# Patient Record
Sex: Female | Born: 1935 | Race: Asian | Hispanic: No | Marital: Married | Smoking: Never smoker
Health system: Southern US, Community
[De-identification: ages and names within clinical notes are randomized; demographics above are authoritative.]

## PROBLEM LIST (undated history)

## (undated) DIAGNOSIS — Z87898 Personal history of other specified conditions: Secondary | ICD-10-CM

## (undated) DIAGNOSIS — M8589 Other specified disorders of bone density and structure, multiple sites: Secondary | ICD-10-CM

## (undated) DIAGNOSIS — N393 Stress incontinence (female) (male): Secondary | ICD-10-CM

## (undated) DIAGNOSIS — M47815 Spondylosis without myelopathy or radiculopathy, thoracolumbar region: Secondary | ICD-10-CM

## (undated) DIAGNOSIS — E785 Hyperlipidemia, unspecified: Secondary | ICD-10-CM

## (undated) DIAGNOSIS — J302 Other seasonal allergic rhinitis: Secondary | ICD-10-CM

## (undated) DIAGNOSIS — I10 Essential (primary) hypertension: Secondary | ICD-10-CM

## (undated) DIAGNOSIS — F0390 Unspecified dementia without behavioral disturbance: Secondary | ICD-10-CM

## (undated) DIAGNOSIS — K859 Acute pancreatitis without necrosis or infection, unspecified: Secondary | ICD-10-CM

## (undated) HISTORY — DX: Other specified disorders of bone density and structure, multiple sites: M85.89

## (undated) HISTORY — DX: Other seasonal allergic rhinitis: J30.2

## (undated) HISTORY — PX: CHOLECYSTECTOMY: SHX55

## (undated) HISTORY — DX: Spondylosis without myelopathy or radiculopathy, thoracolumbar region: M47.815

## (undated) HISTORY — DX: Essential (primary) hypertension: I10

## (undated) HISTORY — DX: Personal history of other specified conditions: Z87.898

## (undated) HISTORY — DX: Hyperlipidemia, unspecified: E78.5

## (undated) HISTORY — PX: OTHER SURGICAL HISTORY: SHX169

## (undated) HISTORY — DX: Stress incontinence (female) (male): N39.3

---

## 2004-06-25 ENCOUNTER — Ambulatory Visit: Payer: Self-pay | Admitting: Family Medicine

## 2006-03-03 ENCOUNTER — Ambulatory Visit: Payer: Self-pay | Admitting: Family Medicine

## 2007-03-06 ENCOUNTER — Ambulatory Visit: Payer: Self-pay | Admitting: Family Medicine

## 2008-08-08 ENCOUNTER — Ambulatory Visit: Payer: Self-pay | Admitting: Family Medicine

## 2011-03-01 DIAGNOSIS — I10 Essential (primary) hypertension: Secondary | ICD-10-CM | POA: Diagnosis not present

## 2011-03-01 DIAGNOSIS — E785 Hyperlipidemia, unspecified: Secondary | ICD-10-CM | POA: Diagnosis not present

## 2011-10-18 DIAGNOSIS — Z23 Encounter for immunization: Secondary | ICD-10-CM | POA: Diagnosis not present

## 2012-02-07 DIAGNOSIS — R252 Cramp and spasm: Secondary | ICD-10-CM | POA: Diagnosis not present

## 2012-02-07 DIAGNOSIS — E7800 Pure hypercholesterolemia, unspecified: Secondary | ICD-10-CM | POA: Diagnosis not present

## 2012-08-03 ENCOUNTER — Ambulatory Visit: Payer: Self-pay | Admitting: Family Medicine

## 2012-11-09 ENCOUNTER — Ambulatory Visit: Payer: Self-pay | Admitting: Family Medicine

## 2012-12-26 ENCOUNTER — Ambulatory Visit: Payer: Self-pay | Admitting: Family Medicine

## 2012-12-26 DIAGNOSIS — R05 Cough: Secondary | ICD-10-CM | POA: Diagnosis not present

## 2012-12-26 IMAGING — CR DG CHEST 2V
1 series · 2 of 2 positions shown · non-contrast
Comparison: None.

CLINICAL DATA: Cough.

EXAM:
CHEST  2 VIEW

[Series 1: pa · 0.17mm/px · 2 of 2 slices shown]
[im 1/2]
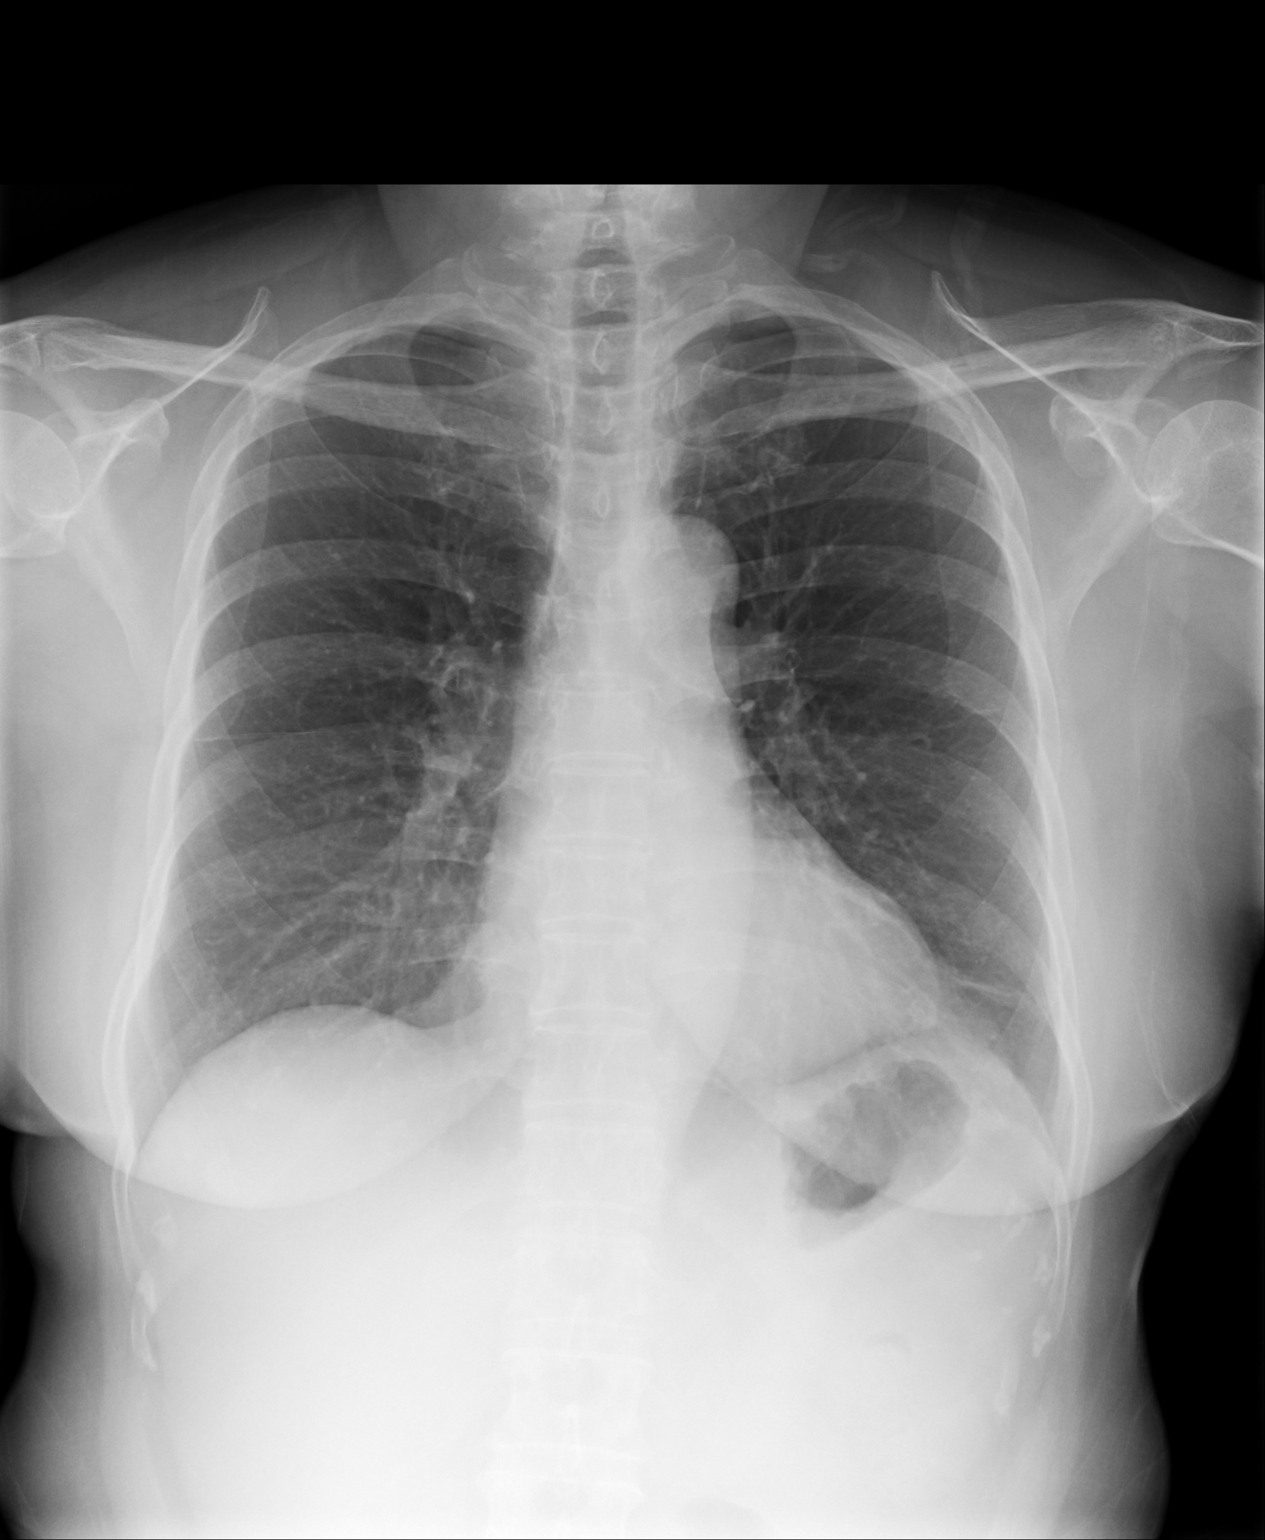
[im 2/2]
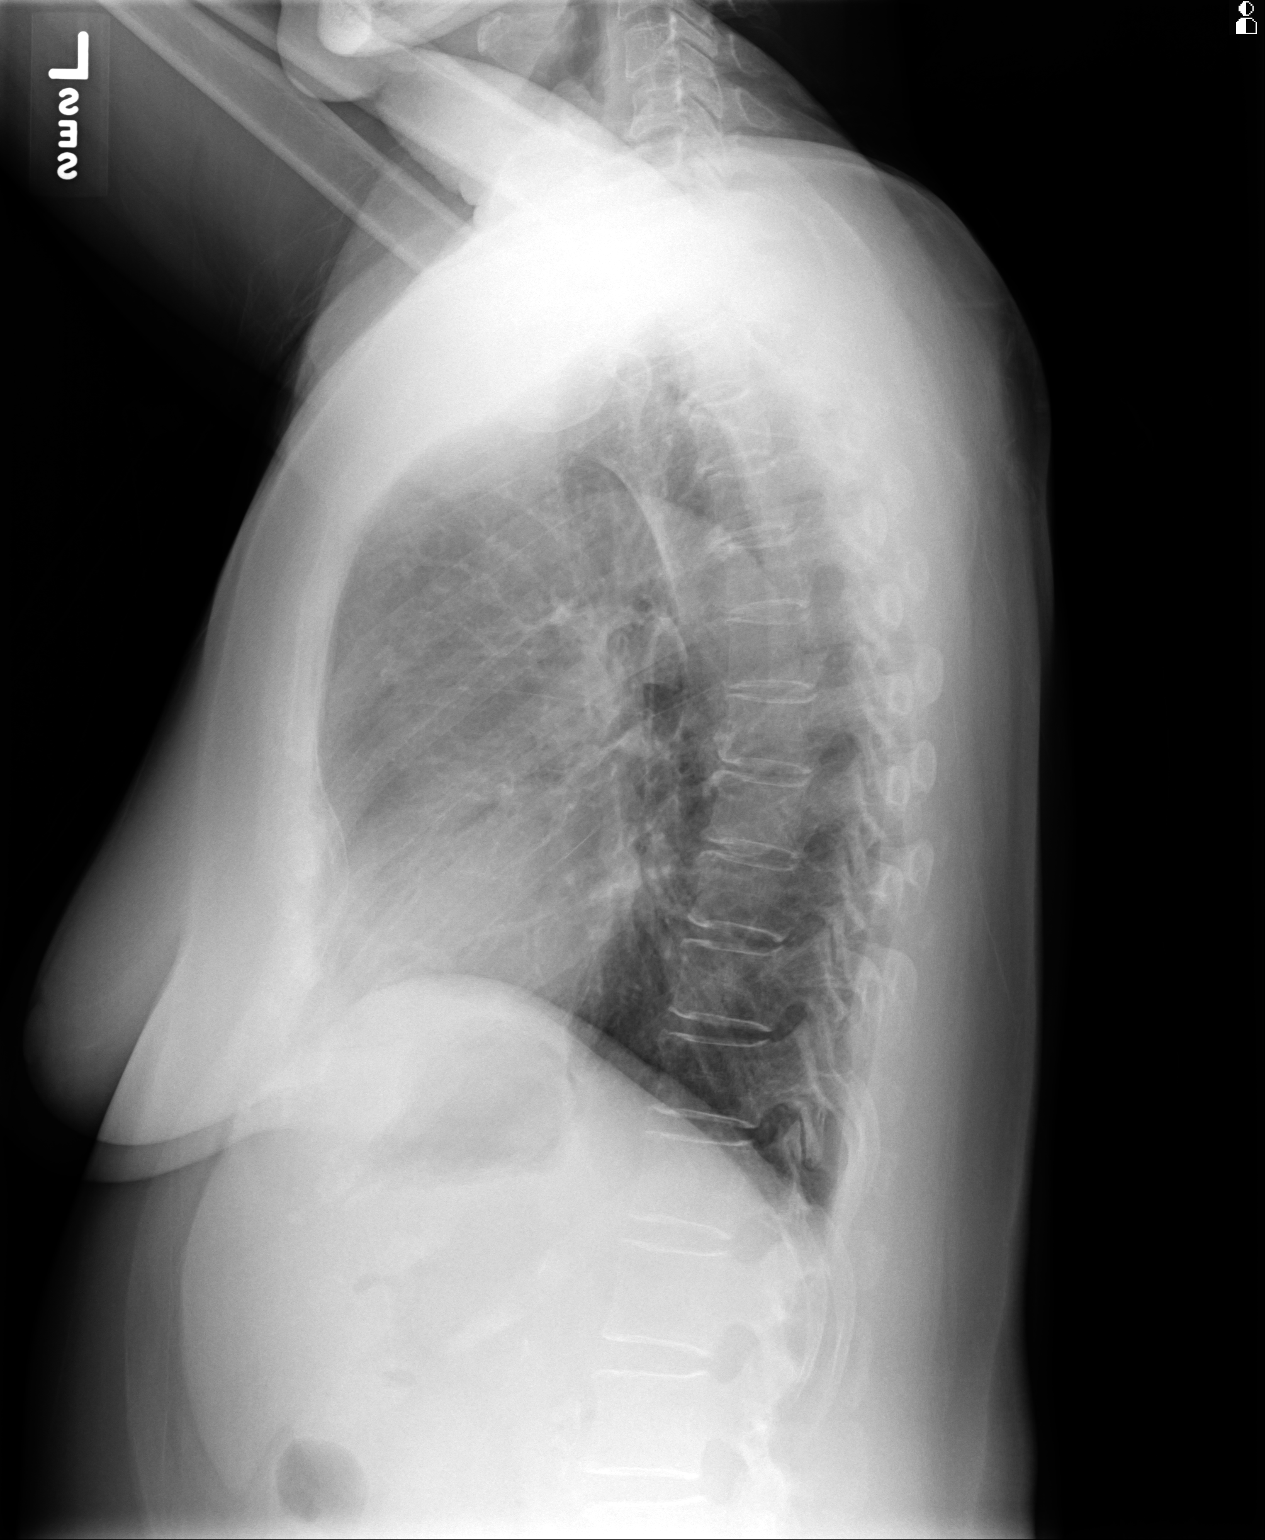

[2 of 2 positions shown; findings below may reference images not displayed]

FINDINGS: The heart size and mediastinal contours are within normal limits.
Both lungs are clear. No pleural effusion or pneumothorax is noted.
The visualized skeletal structures are unremarkable.
IMPRESSION: No acute cardiopulmonary abnormality seen.

## 2013-01-03 DIAGNOSIS — E7800 Pure hypercholesterolemia, unspecified: Secondary | ICD-10-CM | POA: Diagnosis not present

## 2013-01-03 DIAGNOSIS — I10 Essential (primary) hypertension: Secondary | ICD-10-CM | POA: Diagnosis not present

## 2013-01-29 DIAGNOSIS — I83893 Varicose veins of bilateral lower extremities with other complications: Secondary | ICD-10-CM | POA: Diagnosis not present

## 2013-02-15 ENCOUNTER — Ambulatory Visit: Payer: Self-pay | Admitting: Emergency Medicine

## 2013-02-15 IMAGING — CR DG CHEST 2V
1 series · 2 of 2 positions shown · non-contrast
Comparison: [DATE]

CLINICAL DATA: Cough for 5 months

EXAM:
CHEST  2 VIEW

[Series 1: pa · 0.17mm/px · 2 of 2 slices shown]
[im 1/2]
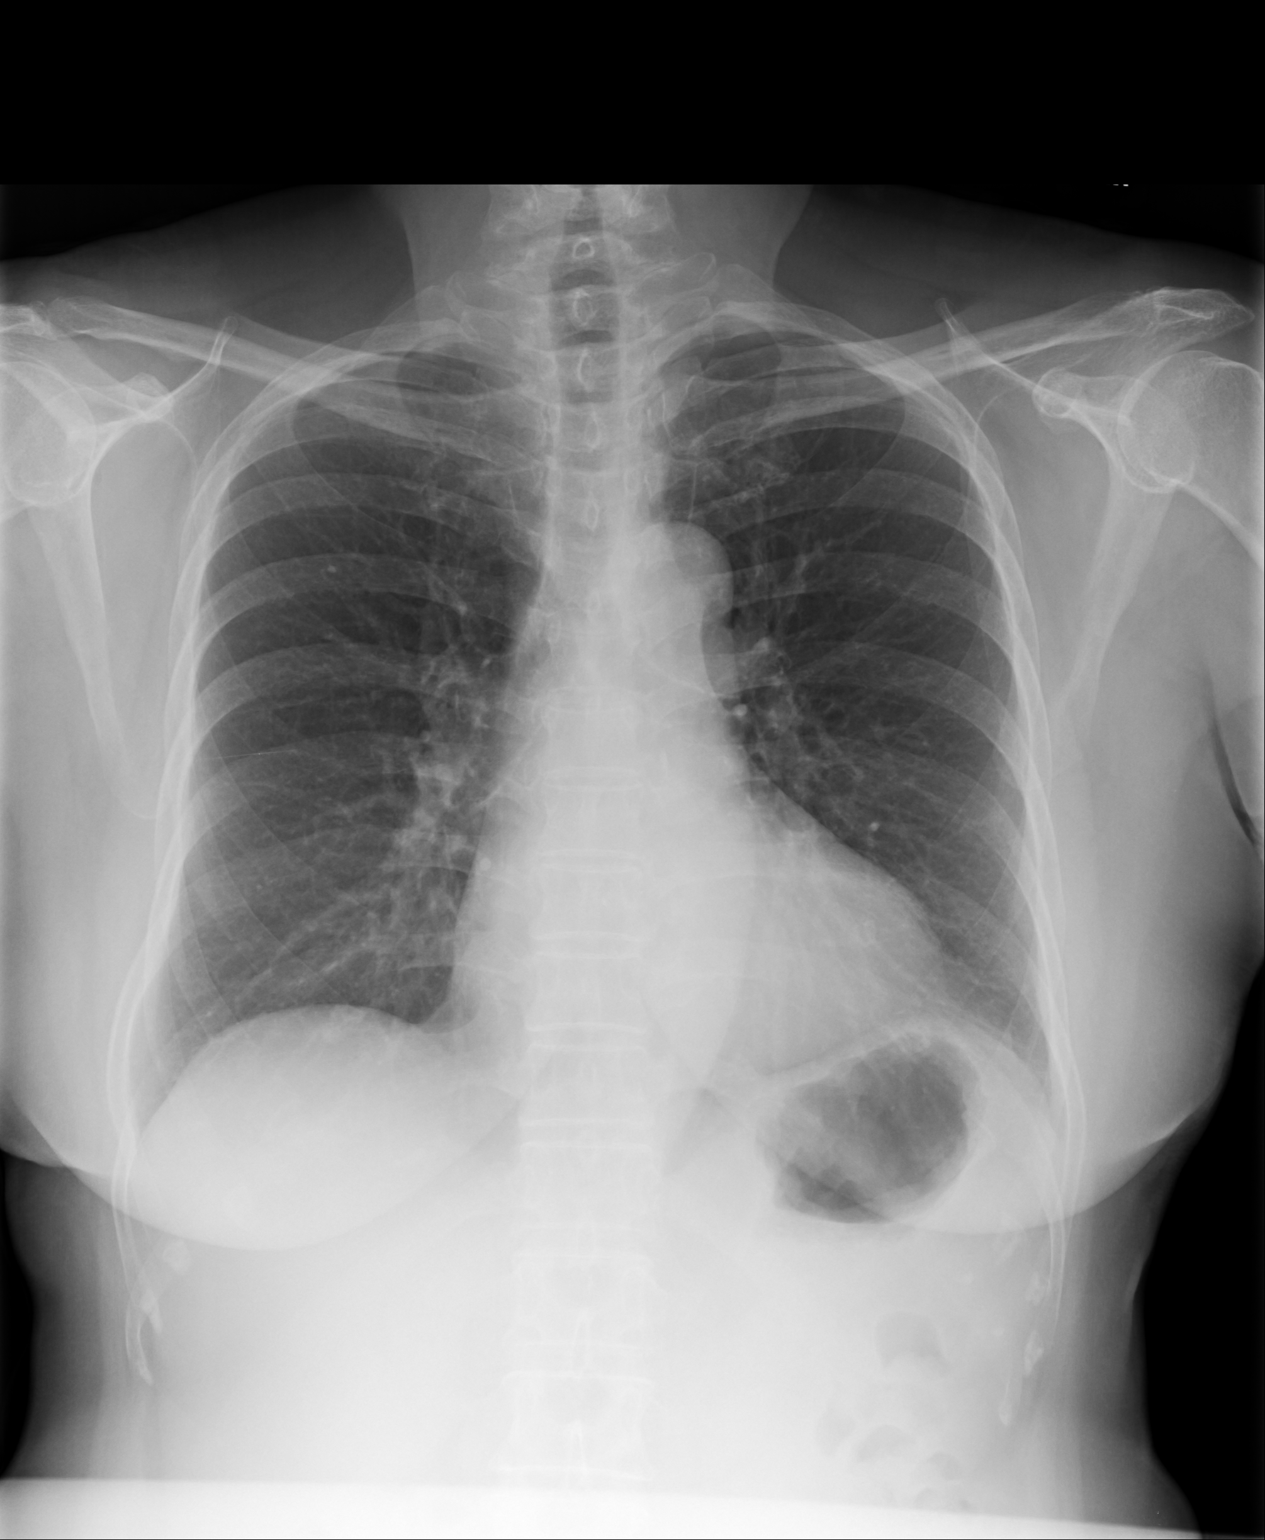
[im 2/2]
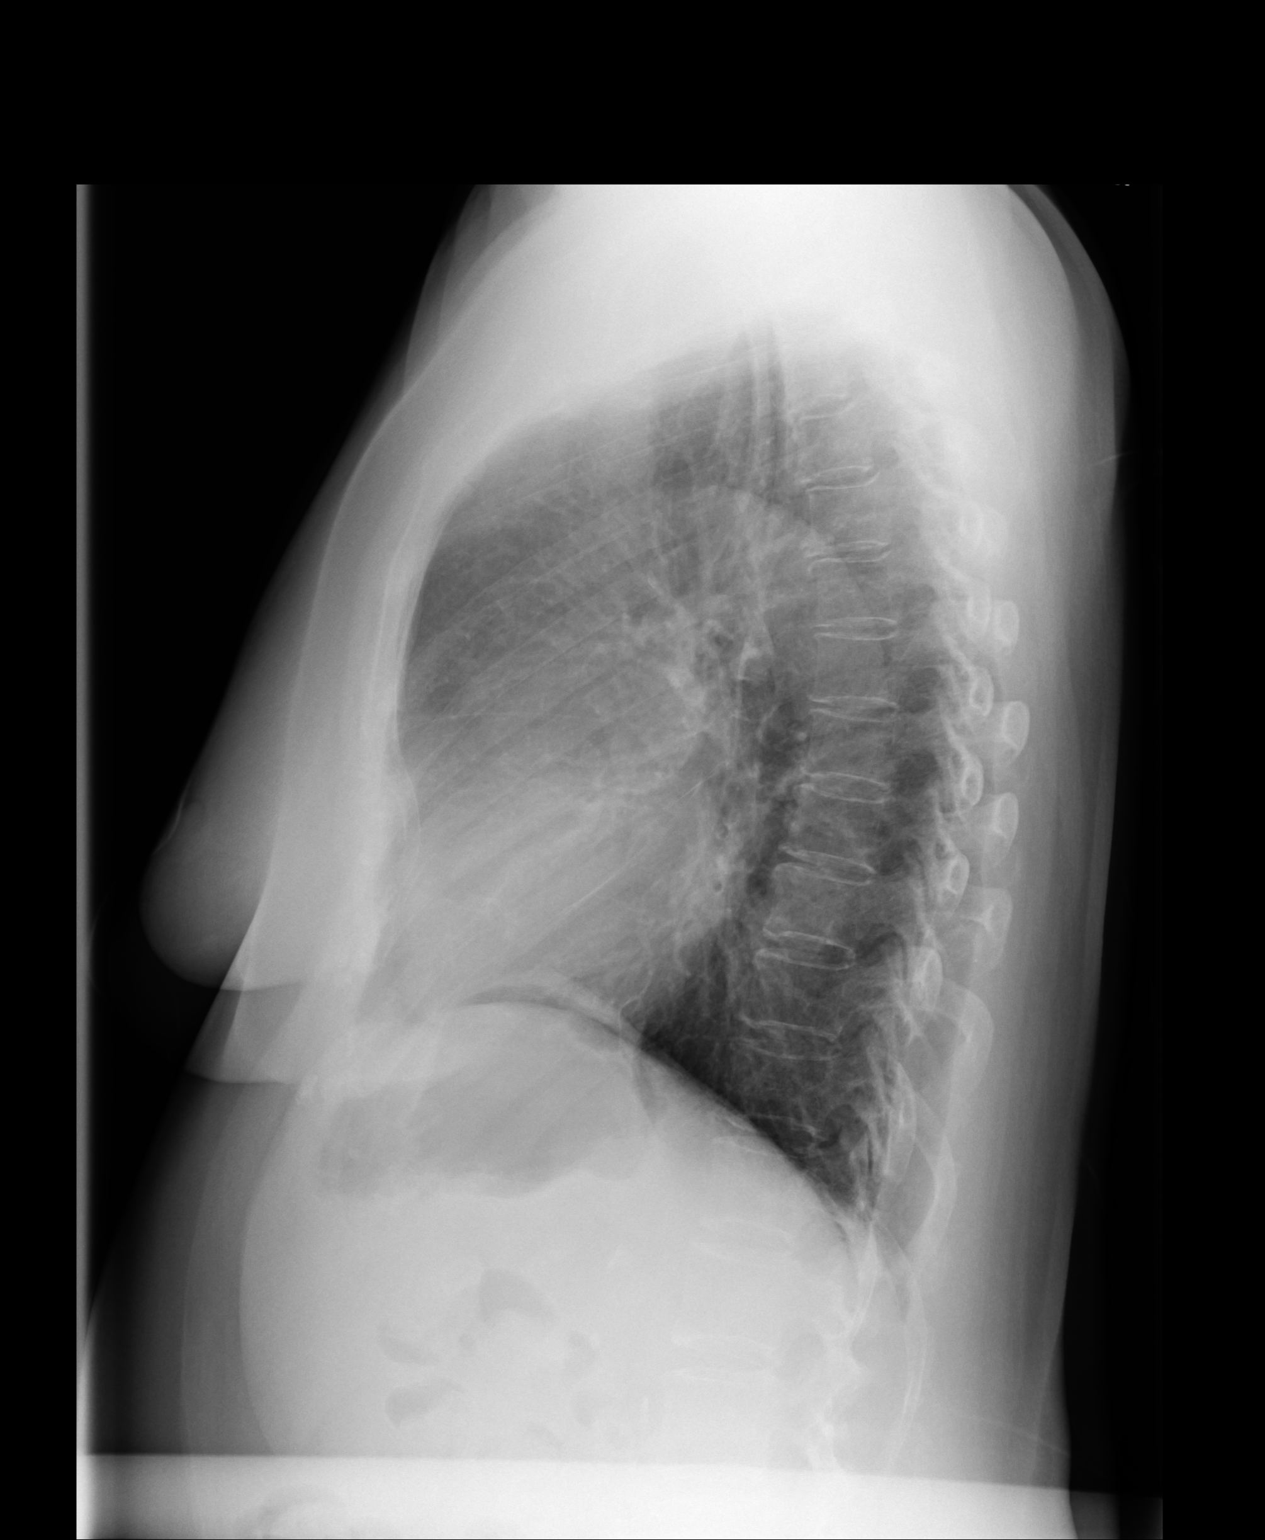

[2 of 2 positions shown; findings below may reference images not displayed]

FINDINGS: Cardiac shadow is within normal limits. The lungs are clear
bilaterally. No acute bony abnormality is seen.
IMPRESSION: No active cardiopulmonary disease.

## 2013-06-26 DIAGNOSIS — H52229 Regular astigmatism, unspecified eye: Secondary | ICD-10-CM | POA: Diagnosis not present

## 2013-06-26 DIAGNOSIS — H5200 Hypermetropia, unspecified eye: Secondary | ICD-10-CM | POA: Diagnosis not present

## 2013-06-26 DIAGNOSIS — H43819 Vitreous degeneration, unspecified eye: Secondary | ICD-10-CM | POA: Diagnosis not present

## 2013-06-26 DIAGNOSIS — H2510 Age-related nuclear cataract, unspecified eye: Secondary | ICD-10-CM | POA: Diagnosis not present

## 2014-02-15 DIAGNOSIS — I10 Essential (primary) hypertension: Secondary | ICD-10-CM | POA: Diagnosis not present

## 2014-02-15 DIAGNOSIS — R42 Dizziness and giddiness: Secondary | ICD-10-CM | POA: Diagnosis not present

## 2014-02-15 DIAGNOSIS — E782 Mixed hyperlipidemia: Secondary | ICD-10-CM | POA: Diagnosis not present

## 2014-02-15 DIAGNOSIS — J302 Other seasonal allergic rhinitis: Secondary | ICD-10-CM | POA: Diagnosis not present

## 2014-02-15 DIAGNOSIS — Z9181 History of falling: Secondary | ICD-10-CM | POA: Diagnosis not present

## 2014-02-15 DIAGNOSIS — Z1389 Encounter for screening for other disorder: Secondary | ICD-10-CM | POA: Diagnosis not present

## 2014-02-18 DIAGNOSIS — I10 Essential (primary) hypertension: Secondary | ICD-10-CM | POA: Diagnosis not present

## 2014-02-18 DIAGNOSIS — E785 Hyperlipidemia, unspecified: Secondary | ICD-10-CM | POA: Diagnosis not present

## 2014-02-18 DIAGNOSIS — R42 Dizziness and giddiness: Secondary | ICD-10-CM | POA: Diagnosis not present

## 2014-03-25 DIAGNOSIS — E780 Pure hypercholesterolemia: Secondary | ICD-10-CM | POA: Diagnosis not present

## 2014-03-25 DIAGNOSIS — I10 Essential (primary) hypertension: Secondary | ICD-10-CM | POA: Diagnosis not present

## 2014-03-25 DIAGNOSIS — R05 Cough: Secondary | ICD-10-CM | POA: Diagnosis not present

## 2014-03-25 DIAGNOSIS — J302 Other seasonal allergic rhinitis: Secondary | ICD-10-CM | POA: Diagnosis not present

## 2014-07-29 DIAGNOSIS — M9904 Segmental and somatic dysfunction of sacral region: Secondary | ICD-10-CM | POA: Diagnosis not present

## 2014-07-29 DIAGNOSIS — M9905 Segmental and somatic dysfunction of pelvic region: Secondary | ICD-10-CM | POA: Diagnosis not present

## 2014-07-29 DIAGNOSIS — M545 Low back pain: Secondary | ICD-10-CM | POA: Diagnosis not present

## 2014-07-29 DIAGNOSIS — M9903 Segmental and somatic dysfunction of lumbar region: Secondary | ICD-10-CM | POA: Diagnosis not present

## 2014-07-31 DIAGNOSIS — M9903 Segmental and somatic dysfunction of lumbar region: Secondary | ICD-10-CM | POA: Diagnosis not present

## 2014-07-31 DIAGNOSIS — M9904 Segmental and somatic dysfunction of sacral region: Secondary | ICD-10-CM | POA: Diagnosis not present

## 2014-07-31 DIAGNOSIS — M9905 Segmental and somatic dysfunction of pelvic region: Secondary | ICD-10-CM | POA: Diagnosis not present

## 2014-07-31 DIAGNOSIS — M545 Low back pain: Secondary | ICD-10-CM | POA: Diagnosis not present

## 2014-10-29 ENCOUNTER — Encounter: Payer: Self-pay | Admitting: Emergency Medicine

## 2014-10-29 ENCOUNTER — Ambulatory Visit
Admission: EM | Admit: 2014-10-29 | Discharge: 2014-10-29 | Disposition: A | Discharge status: Home / Self Care | Payer: Medicare Other | Attending: Family Medicine | Admitting: Family Medicine

## 2014-10-29 DIAGNOSIS — L980 Pyogenic granuloma: Secondary | ICD-10-CM | POA: Diagnosis not present

## 2014-10-29 NOTE — ED Notes (Signed)
Patient states she "pinched the skin on her pinkie finger 2 months ago causing a blister. It doesn't seem to be getting any better."

## 2014-10-29 NOTE — Discharge Instructions (Signed)
graniloma

## 2014-10-29 NOTE — ED Provider Notes (Signed)
CSN: [id number]     Arrival date & time 10/29/14  1478 History   First MD Initiated Contact with Patient 10/29/14 1018     Chief Complaint  Patient presents with  . Hand Pain   (Consider location/radiation/quality/duration/timing/severity/associated sxs/prior Treatment) HPI   Is a 79 year old female who presents with a growth on her left little finger distal pad. She states that 2 months ago she pinched her skin causing a blister which then had progressed to the growth that she has noticed enlarging. Her other complaint is that it bleeds frequently and she washes dishes or uses her hand excessively. She is concerned that it was an infection. She denies any fever or chills.  History reviewed. No pertinent past medical history. History reviewed. No pertinent past surgical history. History reviewed. No pertinent family history. Social History  Substance Use Topics  . Smoking status: Never Smoker   . Smokeless tobacco: None  . Alcohol Use: No   OB History    No data available     Review of Systems  Constitutional: Negative for fever, chills and fatigue.  Skin: Positive for wound.  All other systems reviewed and are negative.   Allergies  Review of patient's allergies indicates no known allergies.  Home Medications   Prior to Admission medications   Not on File   Meds Ordered and Administered this Visit  Medications - No data to display  BP 185/65 mmHg  Pulse 65  Temp(Src) 96.8 F (36 C) (Tympanic)  Resp 16  Ht 5' (1.524 m)  Wt 119 lb (53.978 kg)  BMI 23.24 kg/m2  SpO2 100% No data found.   Physical Exam  Constitutional: She is oriented to person, place, and time. She appears well-developed and well-nourished.  HENT:  Head: Normocephalic and atraumatic.  Eyes: Pupils are equal, round, and reactive to light.  Neck: Neck supple.  Musculoskeletal: Normal range of motion. She exhibits edema and tenderness.  Neurological: She is alert and oriented to person, place,  and time.  Skin:  Examination of the left hand of the fifth finger nodule with a smooth erythematous surface that has a dusky red color to it measuring approximately 1 cm in diameter with a dark dermal collar. There is a single lesion with no satellite lesions seen. It appears friable. No active bleeding present.  Psychiatric: She has a normal mood and affect. Her behavior is normal. Judgment and thought content normal.  Nursing note and vitals reviewed.   ED Course  Procedures (including critical care time)  Labs Review Labs Reviewed - No data to display  Imaging Review No results found.   Visual Acuity Review  Right Eye Distance:   Left Eye Distance:   Bilateral Distance:    Right Eye Near:   Left Eye Near:    Bilateral Near:         MDM   1. Pyogenic granuloma of skin    Plan: 1. Diagnosis reviewed with patient 2. rx as per orders; risks, benefits, potential side effects reviewed with patient 3. Recommend supportive treatment with protection until seen by dermatologist 4. F/u prn if symptoms worsen or don't improve     Lutricia Feil, PA-C 10/29/14 1121

## 2014-10-31 DIAGNOSIS — L980 Pyogenic granuloma: Secondary | ICD-10-CM | POA: Diagnosis not present

## 2014-10-31 DIAGNOSIS — L538 Other specified erythematous conditions: Secondary | ICD-10-CM | POA: Diagnosis not present

## 2014-10-31 DIAGNOSIS — R58 Hemorrhage, not elsewhere classified: Secondary | ICD-10-CM | POA: Diagnosis not present

## 2015-10-21 DIAGNOSIS — Z23 Encounter for immunization: Secondary | ICD-10-CM | POA: Diagnosis not present

## 2016-04-26 ENCOUNTER — Encounter: Payer: Self-pay | Admitting: *Deleted

## 2016-04-26 ENCOUNTER — Ambulatory Visit
Admission: EM | Admit: 2016-04-26 | Discharge: 2016-04-26 | Disposition: A | Discharge status: Home / Self Care | Payer: Medicare Other | Attending: Family Medicine | Admitting: Family Medicine

## 2016-04-26 DIAGNOSIS — J111 Influenza due to unidentified influenza virus with other respiratory manifestations: Secondary | ICD-10-CM | POA: Insufficient documentation

## 2016-04-26 DIAGNOSIS — R69 Illness, unspecified: Secondary | ICD-10-CM

## 2016-04-26 DIAGNOSIS — R05 Cough: Secondary | ICD-10-CM | POA: Diagnosis present

## 2016-04-26 LAB — RAPID STREP SCREEN (MED CTR MEBANE ONLY): Streptococcus, Group A Screen (Direct): NEGATIVE

## 2016-04-26 MED ORDER — BENZONATATE 100 MG PO CAPS: 100.0000 mg | ORAL_CAPSULE | Freq: Three times a day (TID) | ORAL | 0 refills | Status: DC | PRN

## 2016-04-26 MED ORDER — HYDROCOD POLST-CPM POLST ER 10-8 MG/5ML PO SUER: 2.5000 mL | Freq: Every evening | ORAL | 0 refills | Status: DC | PRN

## 2016-04-26 MED ORDER — OSELTAMIVIR PHOSPHATE 75 MG PO CAPS: 75.0000 mg | ORAL_CAPSULE | Freq: Two times a day (BID) | ORAL | 0 refills | Status: DC

## 2016-04-26 NOTE — ED Provider Notes (Signed)
[facility] ____________________________________________  Time seen: Approximately 12:30 PM  I have reviewed the triage vital signs and the nursing notes.   HISTORY  Chief Complaint Cough; Fever; and Headache  HPI Madeline Taylor is a 81 y.o. female presenting with husband at bedside for the complaints of 2 days of runny nose, nasal congestion, cough, sore throat and reported fevers. Reports some chills and felt like she had a fever, but denies measuring temperature. States mild sore throat. States cough intermittently woke her up from her sleep last night. Denies chest pain, shortness of breath or wheezing. Reports continues to eat and drink well. Reports has been recently had some allergy issues and was seen in urgent care this past Saturday, and reports she was around a lot of sick people in the waiting room. Patient reports that she felt fine prior to yesterday.  Denies chest pain, shortness of breath, abdominal pain, dysuria, extremity pain, extremity swelling or rash. Denies recent sickness. Denies recent antibiotic use. Denies cardiac history. Denies renal insufficiency. Reports she does have some chronic seasonal allergy issues and does intermittently take Zyrtec. Patient states that this feels different than her normal seasonal allergies.     History reviewed. No pertinent past medical history. Seasonal allergies.  Denies chronic medical history.  There are no active problems to display for this patient.   History reviewed. No pertinent surgical history.   No current facility-administered medications for this encounter.   Current Outpatient Prescriptions:  .  benzonatate (TESSALON PERLES) 100 MG capsule, Take 1 capsule (100 mg total) by mouth 3 (three) times daily as needed for cough., Disp: 15 capsule, Rfl: 0 .  chlorpheniramine-HYDROcodone (TUSSIONEX PENNKINETIC ER) 10-8 MG/5ML SUER, Take 2.5 mLs by mouth at bedtime as needed for cough. do not drive or operate  machinery while taking as can cause drowsiness., Disp: 20 mL, Rfl: 0 .  oseltamivir (TAMIFLU) 75 MG capsule, Take 1 capsule (75 mg total) by mouth every 12 (twelve) hours., Disp: 10 capsule, Rfl: 0  Allergies Patient has no known allergies.  History reviewed. No pertinent family history.  Social History Social History  Substance Use Topics  . Smoking status: Never Smoker  . Smokeless tobacco: Never Used  . Alcohol use No    Review of Systems Constitutional: As above.  Eyes: No visual changes. ENT: As above.  Cardiovascular: Denies chest pain. Respiratory: Denies shortness of breath. Gastrointestinal: No abdominal pain.  No nausea, no vomiting.  No diarrhea.  No constipation. Genitourinary: Negative for dysuria. Musculoskeletal: Negative for back pain. Skin: Negative for rash.   ____________________________________________   PHYSICAL EXAM:  VITAL SIGNS: ED Triage Vitals  Enc Vitals Group     BP 04/26/16 1128 (!) 147/96     Pulse Rate 04/26/16 1128 90     Resp 04/26/16 1128 16     Temp 04/26/16 1128 98.9 F (37.2 C)     Temp Source 04/26/16 1128 Oral     SpO2 04/26/16 1128 100 %     Weight 04/26/16 1131 115 lb (52.2 kg)     Height 04/26/16 1131  (1.499 m)     Head Circumference --      Peak Flow --      Pain Score --      Pain Loc --      Pain Edu? --      Excl. in GC? --     Constitutional: Alert and oriented. Well appearing and in no acute distress. Eyes: Conjunctivae are  normal. PERRL. EOMI. Head: Atraumatic. No sinus tenderness to palpation. No swelling. No erythema.  Ears: no erythema, normal TMs bilaterally.   Nose:Nasal congestion with clear rhinorrhea  Mouth/Throat: Mucous membranes are moist. Mild pharyngeal erythema. No tonsillar swelling or exudate.  Neck: No stridor.  No cervical spine tenderness to palpation. Hematological/Lymphatic/Immunilogical: No cervical lymphadenopathy. Cardiovascular: Normal rate, regular rhythm. Grossly normal  heart sounds.  Good peripheral circulation. Respiratory: Normal respiratory effort.  No retractions. No wheezes, rales or rhonchi. Good air movement. Dry intermittent cough noted in room. Speaks in complete sentences. Gastrointestinal: Soft and nontender. Musculoskeletal: Ambulatory with steady gait. No cervical, thoracic or lumbar tenderness to palpation. Neurologic:  Normal speech and language. No gait instability. Skin:  Skin appears warm, dry. Psychiatric: Mood and affect are normal. Speech and behavior are normal.  ___________________________________________   LABS (all labs ordered are listed, but only abnormal results are displayed)  Labs Reviewed  RAPID STREP SCREEN (NOT AT [facility])     PROCEDURES Procedures    INITIAL IMPRESSION / ASSESSMENT AND PLAN / ED COURSE  Pertinent labs & imaging results that were available during my care of the patient were reviewed by me and considered in my medical decision making (see chart for details).  Well-appearing patient. No acute distress. Quick strep negative, will culture. Discussed in detail with patient and spouse, suspect viral illness and influenza-like illness. Discussed evaluation and treatment options. Will treat patient with oral Tamiflu, when necessary Tessalon Perles and half dosage when necessary Tussionex at night as needed. Patient's spouse reports she has taken cough medicine such as Tussionex in past and tolerated well.Discussed indication, risks and benefits of medications with patient.  Discussed follow up with Primary care physician this week. Discussed follow up and return parameters including no resolution or any worsening concerns. Patient verbalized understanding and agreed to plan.   ____________________________________________   FINAL CLINICAL IMPRESSION(S) / ED DIAGNOSES  Final diagnoses:  Influenza-like illness     New Prescriptions   BENZONATATE (TESSALON PERLES) 100 MG CAPSULE    Take 1 capsule (100 mg  total) by mouth 3 (three) times daily as needed for cough.   CHLORPHENIRAMINE-HYDROCODONE (TUSSIONEX PENNKINETIC ER) 10-8 MG/5ML SUER    Take 2.5 mLs by mouth at bedtime as needed for cough. do not drive or operate machinery while taking as can cause drowsiness.   OSELTAMIVIR (TAMIFLU) 75 MG CAPSULE    Take 1 capsule (75 mg total) by mouth every 12 (twelve) hours.    Note: This dictation was prepared with Dragon dictation along with smaller phrase technology. Any transcriptional errors that result from this process are unintentional.         Renford Dills, NP 04/26/16 1313

## 2016-04-26 NOTE — Discharge Instructions (Signed)
Take medication as prescribed. Rest. Drink plenty of fluids.  ° °Follow up with your primary care physician this week as needed. Return to Urgent care for new or worsening concerns.  ° °

## 2016-04-26 NOTE — ED Triage Notes (Signed)
Productive cough- clear, fever, and headache x 2days.

## 2016-04-29 LAB — CULTURE, GROUP A STREP (THRC)

## 2016-07-27 ENCOUNTER — Ambulatory Visit: Payer: Self-pay | Admitting: Nurse Practitioner

## 2016-07-29 ENCOUNTER — Ambulatory Visit (INDEPENDENT_AMBULATORY_CARE_PROVIDER_SITE_OTHER): Payer: Medicare Other | Admitting: Nurse Practitioner

## 2016-07-29 ENCOUNTER — Encounter: Payer: Self-pay | Admitting: Nurse Practitioner

## 2016-07-29 VITALS — BP 142/99 | HR 61 | Temp 97.9°F | Ht 59.0 in | Wt 115.2 lb

## 2016-07-29 DIAGNOSIS — N393 Stress incontinence (female) (male): Secondary | ICD-10-CM | POA: Diagnosis not present

## 2016-07-29 DIAGNOSIS — Z1321 Encounter for screening for nutritional disorder: Secondary | ICD-10-CM

## 2016-07-29 DIAGNOSIS — Z1239 Encounter for other screening for malignant neoplasm of breast: Secondary | ICD-10-CM

## 2016-07-29 DIAGNOSIS — M810 Age-related osteoporosis without current pathological fracture: Secondary | ICD-10-CM | POA: Insufficient documentation

## 2016-07-29 DIAGNOSIS — J302 Other seasonal allergic rhinitis: Secondary | ICD-10-CM

## 2016-07-29 DIAGNOSIS — M47815 Spondylosis without myelopathy or radiculopathy, thoracolumbar region: Secondary | ICD-10-CM

## 2016-07-29 DIAGNOSIS — Z13820 Encounter for screening for osteoporosis: Secondary | ICD-10-CM

## 2016-07-29 DIAGNOSIS — R799 Abnormal finding of blood chemistry, unspecified: Secondary | ICD-10-CM

## 2016-07-29 DIAGNOSIS — I10 Essential (primary) hypertension: Secondary | ICD-10-CM | POA: Diagnosis not present

## 2016-07-29 DIAGNOSIS — Z1329 Encounter for screening for other suspected endocrine disorder: Secondary | ICD-10-CM | POA: Diagnosis not present

## 2016-07-29 DIAGNOSIS — Z131 Encounter for screening for diabetes mellitus: Secondary | ICD-10-CM | POA: Diagnosis not present

## 2016-07-29 DIAGNOSIS — Z87898 Personal history of other specified conditions: Secondary | ICD-10-CM

## 2016-07-29 DIAGNOSIS — M479 Spondylosis, unspecified: Secondary | ICD-10-CM

## 2016-07-29 DIAGNOSIS — Z0000 Encounter for general adult medical examination without abnormal findings: Secondary | ICD-10-CM

## 2016-07-29 DIAGNOSIS — R252 Cramp and spasm: Secondary | ICD-10-CM | POA: Diagnosis not present

## 2016-07-29 DIAGNOSIS — M8589 Other specified disorders of bone density and structure, multiple sites: Secondary | ICD-10-CM | POA: Diagnosis not present

## 2016-07-29 DIAGNOSIS — E785 Hyperlipidemia, unspecified: Secondary | ICD-10-CM | POA: Diagnosis not present

## 2016-07-29 DIAGNOSIS — M859 Disorder of bone density and structure, unspecified: Secondary | ICD-10-CM | POA: Diagnosis not present

## 2016-07-29 DIAGNOSIS — Z1211 Encounter for screening for malignant neoplasm of colon: Secondary | ICD-10-CM

## 2016-07-29 HISTORY — DX: Spondylosis, unspecified: M47.9

## 2016-07-29 HISTORY — DX: Other seasonal allergic rhinitis: J30.2

## 2016-07-29 HISTORY — DX: Essential (primary) hypertension: I10

## 2016-07-29 HISTORY — DX: Hyperlipidemia, unspecified: E78.5

## 2016-07-29 HISTORY — DX: Personal history of other specified conditions: Z87.898

## 2016-07-29 HISTORY — DX: Other specified disorders of bone density and structure, multiple sites: M85.89

## 2016-07-29 HISTORY — DX: Stress incontinence (female) (male): N39.3

## 2016-07-29 MED ORDER — LOSARTAN POTASSIUM 25 MG PO TABS: 25.0000 mg | ORAL_TABLET | Freq: Every day | ORAL | 1 refills | Status: DC

## 2016-07-29 NOTE — Patient Instructions (Addendum)
Madeline Taylor, Thank you for coming in to clinic today.  1. Your annual exam is normal without new findings. You have high blood pressure. Please restart taking your losartan 25 mg once daily.  2. Please call to schedule your mammogram and dexa scan. Your mammogram and bone density scan orders have been placed.  Call the Scheduling phone number at [phone] to schedule your mammogram at your convenience.  You can choose to go to either location listed below.  Let the scheduler know which location you prefer.  [facility] Outpatient Radiology [address] [place], [place] [address] [place], [place] [address]  3. Colon Cancer Screening: - For all adults age 25 and older, routine colon cancer screening is highly recommended. - Early detection of colon cancer is important, because often there are no warning signs or symptoms.  If colon cancer is found early, usually it can be cured. Advanced cancer is hard to treat.  - If you are not interested in Colonoscopy screening (if done and normal you could be cleared for 5 to 10 years until next due), then Cologuard is an excellent alternative for screening test for Colon Cancer. It is highly sensitive for detecting DNA of colon cancer from even the earliest stages. Also, there is NO bowel prep required. - If Cologuard is NEGATIVE, then it is good for 3 years before next due - If Cologuard is POSITIVE, then it is strongly advised to get a Colonoscopy, which allows the GI doctor to locate the source of the cancer or polyp (even very early stage) and treat it by removing it. ------------------------- If you would like to proceed with Cologuard (stool DNA test) - FIRST, call your insurance company and tell them you want to check cost of Cologuard tell them CPT Code [phone] (it may be completely covered with a small or no cost, OR max cost without any coverage is  about $600). If you do NOT open the kit, and decide not to do the test, you will NOT be charged, you should contact the company to return the kit if you decide not to do the test. - If you want to proceed, you can notify us (office phone, [place], or at next visit) and we will order it for you. The test kit will be delivered to your house in about 1 week. Follow instructions to collect your stool sample.  You may call the company for any help or questions, 24/7 telephone support at [phone].   Please schedule a follow-up appointment with Cassell Smiles, AGNP to Return in about 3 weeks (around 08/19/2016) for blood pressure and lab; fasting labs in 1-3 days.  If you have any other questions or concerns, please feel free to call the clinic or send a message through [place]. You may also schedule an earlier appointment if necessary.  Cassell Smiles, DNP, AGNP-BC Adult Gerontology Nurse Practitioner [facility], [facility]  Bone Densitometry Bone densitometry is an imaging test that uses a special X-ray to measure the amount of calcium and other minerals in your bones (bone density). This test is also known as a bone mineral density test or dual-energy X-ray absorptiometry (DXA). The test can measure bone density at your hip and your spine. It is similar to having a regular X-ray. You may have this test to:  Diagnose a condition that causes weak or thin bones (osteoporosis).  Predict your risk of a broken bone (  fracture).  Determine how well osteoporosis treatment is working.  Tell a health care provider about:  Any allergies you have.  All medicines you are taking, including vitamins, herbs, eye drops, creams, and over-the-counter medicines.  Any problems you or family members have had with anesthetic medicines.  Any blood disorders you have.  Any surgeries you have had.  Any medical conditions you have.  Possibility of pregnancy.  Any other medical test you  had within the previous 14 days that used contrast material. What are the risks? Generally, this is a safe procedure. However, problems can occur and may include the following:  This test exposes you to a very small amount of radiation.  The risks of radiation exposure may be greater to unborn children.  What happens before the procedure?  Do not take any calcium supplements for 24 hours before having the test. You can otherwise eat and drink what you usually do.  Take off all metal jewelry, eyeglasses, dental appliances, and any other metal objects. What happens during the procedure?  You may lie on an exam table. There will be an X-ray generator below you and an imaging device above you.  Other devices, such as boxes or braces, may be used to position your body properly for the scan.  You will need to lie still while the machine slowly scans your body.  The images will show up on a computer monitor. What happens after the procedure? You may need more testing at a later time. This information is not intended to replace advice given to you by your health care provider. Make sure you discuss any questions you have with your health care provider. Document Released: 01/27/2004 Document Revised: 06/12/2015 Document Reviewed: 06/14/2013 Elsevier Interactive Patient Education  2018 [employer].

## 2016-07-29 NOTE — Progress Notes (Addendum)
Subjective:    Patient ID: Madeline Taylor, female    DOB: Jun 03, 1935, 81 y.o.   MRN: [id number]  Madeline Taylor is a 81 y.o. female presenting on 07/29/2016 for Annual Exam   HPI  Pt was last seen in this clinic on 03/25/2014  Annual Physical Exam Patient has been feeling well.  She stopped taking her medications about 2 years ago and feels like she has more energy and can do things.  She does want her blood pressure addressed today. Sleeps 7 hours per night interrupted.  5 hrs at night and naps during day.  HEALTH MAINTENANCE: Weight/BMI: Healthy weight. Asian woman BMI  Physical activity: housework, Haematologist, works hard enough for sweat, hr increase Diet: moderate to high salt, avoids pork eats beef about 1-2 x per week, eats lots of vegetables, eat out 1 x per month, never fries foods. White rice.  Avoids noodles. Seatbelt: always Sunscreen: never - wears large hat and covers skin w/ clothing Mammogram: prior mammograms were normal - pt requests mammogram today.  Sister w/ recent diagnosis of "breast cancer."  DEXA: last DEXA w/ osteopenia - needs rescan Colonoscopy: needs cologuard  VACCINES: Tetanus: Needs tetanus. Pneumonia: Has had pneumovax.  Needs prevnar. Shingles: declines vaccine. Pt reports she has had one at [facility].  Hypertension She is checking BP at home.  Readings 120-147/ 80s - 90 Current medications: None,   Pt denies headache, lightheadedness, dizziness, changes in vision, chest tightness/pressure, palpitations, leg swelling, sudden loss of speech or loss of consciousness.  Cramps in legs Lately is improving, but worse when doing yard work.  Has cramping when walking   History reviewed. No pertinent past medical history. History reviewed. No pertinent surgical history. Social History   Social History  . Marital status: Married    Spouse name: N/A  . Number of children: N/A  . Years of education: N/A   Occupational History  . Not on file.   Social  History Main Topics  . Smoking status: Never Smoker  . Smokeless tobacco: Never Used  . Alcohol use No  . Drug use: Unknown  . Sexual activity: Not on file   Other Topics Concern  . Not on file   Social History Narrative  . No narrative on file   Family History  Problem Relation Age of Onset  . Breast cancer Sister    No current outpatient prescriptions on file prior to visit.   No current facility-administered medications on file prior to visit.     Review of Systems  Constitutional: Negative.   HENT: Negative.   Eyes: Negative.   Cardiovascular: Negative.   Gastrointestinal: Negative.   Endocrine: Negative.   Genitourinary:       Occasional urinary stress incontinence w/ cough, sneeze, physical activity  Musculoskeletal: Positive for arthralgias.       Cramps in legs  Skin: Negative.   Allergic/Immunologic: Negative.   Neurological: Positive for dizziness.  Hematological: Negative.   Psychiatric/Behavioral: Negative.    Per HPI unless specifically indicated above  Depression screen [facility] 2/9 07/29/2016  Decreased Interest 0  Down, Depressed, Hopeless 0  PHQ - 2 Score 0  Altered sleeping 0  Tired, decreased energy 0  Change in appetite 0  Feeling bad or failure about yourself  0  Trouble concentrating 0  Moving slowly or fidgety/restless 0  Suicidal thoughts 0  PHQ-9 Score 0      Objective:    BP (!) 142/99 (BP Location: Right Arm, Patient Position:   Subjective:    Patient ID: Madeline Taylor, female    DOB: 01/17/1936, 80 y.o.   MRN: [id number]  Madeline Taylor is a 80 y.o. female presenting on 07/29/2016 for Annual Exam   HPI  Pt was last seen in this clinic on 03/25/2014  Annual Physical Exam Patient has been feeling well.  She stopped taking her medications about 2 years ago and feels like she has more energy and can do things.  She does want her blood pressure addressed today. Sleeps 7 hours per night interrupted.  5 hrs at night and naps during day.  HEALTH MAINTENANCE: Weight/BMI: Healthy weight. Asian woman BMI  Physical activity: housework, yardwork, works hard enough for sweat, hr increase Diet: moderate to high salt, avoids pork eats beef about 1-2 x per week, eats lots of vegetables, eat out 1 x per month, never fries foods. White rice.  Avoids noodles. Seatbelt: always Sunscreen: never - wears large hat and covers skin w/ clothing Mammogram: prior mammograms were normal - pt requests mammogram today.  Sister w/ recent diagnosis of "breast cancer."  DEXA: last DEXA w/ osteopenia - needs rescan Colonoscopy: needs cologuard  VACCINES: Tetanus: Needs tetanus. Pneumonia: Has had pneumovax.  Needs prevnar. Shingles: declines vaccine. Pt reports she has had one at [facility].  Hypertension She is checking BP at home.  Readings 120-147/ 80s - 90 Current medications: None,   Pt denies headache, lightheadedness, dizziness, changes in vision, chest tightness/pressure, palpitations, leg swelling, sudden loss of speech or loss of consciousness.  Cramps in legs Lately is improving, but worse when doing yard work.  Has cramping when walking   History reviewed. No pertinent past medical history. History reviewed. No pertinent surgical history. Social History   Social History  . Marital status: Married    Spouse name: N/A  . Number of children: N/A  . Years of education: N/A   Occupational History  . Not on file.   Social  History Main Topics  . Smoking status: Never Smoker  . Smokeless tobacco: Never Used  . Alcohol use No  . Drug use: Unknown  . Sexual activity: Not on file   Other Topics Concern  . Not on file   Social History Narrative  . No narrative on file   Family History  Problem Relation Age of Onset  . Breast cancer Sister    No current outpatient prescriptions on file prior to visit.   No current facility-administered medications on file prior to visit.     Review of Systems  Constitutional: Negative.   HENT: Negative.   Eyes: Negative.   Cardiovascular: Negative.   Gastrointestinal: Negative.   Endocrine: Negative.   Genitourinary:       Occasional urinary stress incontinence w/ cough, sneeze, physical activity  Musculoskeletal: Positive for arthralgias.       Cramps in legs  Skin: Negative.   Allergic/Immunologic: Negative.   Neurological: Positive for dizziness.  Hematological: Negative.   Psychiatric/Behavioral: Negative.    Per HPI unless specifically indicated above  Depression screen PHQ 2/9 07/29/2016  Decreased Interest 0  Down, Depressed, Hopeless 0  PHQ - 2 Score 0  Altered sleeping 0  Tired, decreased energy 0  Change in appetite 0  Feeling bad or failure about yourself  0  Trouble concentrating 0  Moving slowly or fidgety/restless 0  Suicidal thoughts 0  PHQ-9 Score 0      Objective:    BP (!) 142/99 (BP Location: Right Arm, Patient Position:    Subjective:    Patient ID: Madeline Taylor, female    DOB: 01/17/1936, 80 y.o.   MRN: [id number]  Madeline Taylor is a 80 y.o. female presenting on 07/29/2016 for Annual Exam   HPI  Pt was last seen in this clinic on 03/25/2014  Annual Physical Exam Patient has been feeling well.  She stopped taking her medications about 2 years ago and feels like she has more energy and can do things.  She does want her blood pressure addressed today. Sleeps 7 hours per night interrupted.  5 hrs at night and naps during day.  HEALTH MAINTENANCE: Weight/BMI: Healthy weight. Asian woman BMI  Physical activity: housework, yardwork, works hard enough for sweat, hr increase Diet: moderate to high salt, avoids pork eats beef about 1-2 x per week, eats lots of vegetables, eat out 1 x per month, never fries foods. White rice.  Avoids noodles. Seatbelt: always Sunscreen: never - wears large hat and covers skin w/ clothing Mammogram: prior mammograms were normal - pt requests mammogram today.  Sister w/ recent diagnosis of "breast cancer."  DEXA: last DEXA w/ osteopenia - needs rescan Colonoscopy: needs cologuard  VACCINES: Tetanus: Needs tetanus. Pneumonia: Has had pneumovax.  Needs prevnar. Shingles: declines vaccine. Pt reports she has had one at [facility].  Hypertension She is checking BP at home.  Readings 120-147/ 80s - 90 Current medications: None,   Pt denies headache, lightheadedness, dizziness, changes in vision, chest tightness/pressure, palpitations, leg swelling, sudden loss of speech or loss of consciousness.  Cramps in legs Lately is improving, but worse when doing yard work.  Has cramping when walking   History reviewed. No pertinent past medical history. History reviewed. No pertinent surgical history. Social History   Social History  . Marital status: Married    Spouse name: N/A  . Number of children: N/A  . Years of education: N/A   Occupational History  . Not on file.   Social  History Main Topics  . Smoking status: Never Smoker  . Smokeless tobacco: Never Used  . Alcohol use No  . Drug use: Unknown  . Sexual activity: Not on file   Other Topics Concern  . Not on file   Social History Narrative  . No narrative on file   Family History  Problem Relation Age of Onset  . Breast cancer Sister    No current outpatient prescriptions on file prior to visit.   No current facility-administered medications on file prior to visit.     Review of Systems  Constitutional: Negative.   HENT: Negative.   Eyes: Negative.   Cardiovascular: Negative.   Gastrointestinal: Negative.   Endocrine: Negative.   Genitourinary:       Occasional urinary stress incontinence w/ cough, sneeze, physical activity  Musculoskeletal: Positive for arthralgias.       Cramps in legs  Skin: Negative.   Allergic/Immunologic: Negative.   Neurological: Positive for dizziness.  Hematological: Negative.   Psychiatric/Behavioral: Negative.    Per HPI unless specifically indicated above  Depression screen PHQ 2/9 07/29/2016  Decreased Interest 0  Down, Depressed, Hopeless 0  PHQ - 2 Score 0  Altered sleeping 0  Tired, decreased energy 0  Change in appetite 0  Feeling bad or failure about yourself  0  Trouble concentrating 0  Moving slowly or fidgety/restless 0  Suicidal thoughts 0  PHQ-9 Score 0      Objective:    BP (!) 142/99 (BP Location: Right Arm, Patient Position:

## 2016-07-29 NOTE — Assessment & Plan Note (Signed)
PT w/ uncontrolled BP. Not taking previously prescribed medications.  Pt previously on losartan 25 mg once daily.  Eating moderate to high salt diet.  Plan: 1. Encouraged low salt, low fat diet. 2. START taking losartan 25 mg once daily. 3. Check BP at home 1-2 x weekly and keep a log. 4. Check CMP today.  BMP in 2-3 weeks. 5. Follow up 3 weeks.

## 2016-07-29 NOTE — Progress Notes (Signed)
I have reviewed this encounter including the documentation in this note and/or discussed this patient with the provider, Wilhelmina McardleLauren Kennedy, AGPCNP-BC. I am certifying that I agree with the content of this note as supervising physician.  Saralyn PilarAlexander Karamalegos, DO [facility] 07/29/2016, 12:20 PM

## 2016-07-29 NOTE — Assessment & Plan Note (Signed)
Pt w/ prior diagnosis of hyperlipidemia. Not currently on medication. No recent lipid panel.  Pt previously taking 1,000 mg fish oil daily, but none in last 6 months.  Plan: 1. Resume fish oil 1,000 mg once daily. 2. Check lipid panel fasting today or tomorrow. 3. Follow up 3-6 months.

## 2016-07-30 ENCOUNTER — Other Ambulatory Visit: Payer: Medicare Other

## 2016-07-30 LAB — TSH: TSH: 3.77 mIU/L

## 2016-07-31 LAB — COMPREHENSIVE METABOLIC PANEL
ALT: 19 U/L (ref 6–29)
AST: 21 U/L (ref 10–35)
Albumin: 4.2 g/dL (ref 3.6–5.1)
Alkaline Phosphatase: 70 U/L (ref 33–130)
BUN: 15 mg/dL (ref 7–25)
CO2: 20 mmol/L (ref 20–31)
Calcium: 9.3 mg/dL (ref 8.6–10.4)
Chloride: 104 mmol/L (ref 98–110)
Creat: 0.75 mg/dL (ref 0.60–0.93)
Glucose, Bld: 79 mg/dL (ref 65–99)
Potassium: 4.2 mmol/L (ref 3.5–5.3)
Sodium: 140 mmol/L (ref 135–146)
Total Bilirubin: 0.5 mg/dL (ref 0.2–1.2)
Total Protein: 7.1 g/dL (ref 6.1–8.1)

## 2016-07-31 LAB — HEMOGLOBIN A1C
Hgb A1c MFr Bld: 5.4 % (ref <5.7)
Mean Plasma Glucose: 108 mg/dL

## 2016-07-31 LAB — VITAMIN D 25 HYDROXY (VIT D DEFICIENCY, FRACTURES): Vit D, 25-Hydroxy: 25 ng/mL — ABNORMAL LOW (ref 30–100)

## 2016-07-31 LAB — LIPID PANEL
Cholesterol: 280 mg/dL — ABNORMAL HIGH (ref <200)
HDL: 52 mg/dL (ref >50)
LDL Cholesterol: 177 mg/dL — ABNORMAL HIGH (ref <100)
Total CHOL/HDL Ratio: 5.4 Ratio — ABNORMAL HIGH (ref <5.0)
Triglycerides: 254 mg/dL — ABNORMAL HIGH (ref <150)
VLDL: 51 mg/dL — ABNORMAL HIGH (ref <30)

## 2016-07-31 LAB — MAGNESIUM: Magnesium: 2.1 mg/dL (ref 1.5–2.5)

## 2016-08-02 ENCOUNTER — Telehealth: Payer: Self-pay | Admitting: Nurse Practitioner

## 2016-08-02 ENCOUNTER — Other Ambulatory Visit: Payer: Self-pay | Admitting: Nurse Practitioner

## 2016-08-02 DIAGNOSIS — E782 Mixed hyperlipidemia: Secondary | ICD-10-CM

## 2016-08-02 MED ORDER — SIMVASTATIN 20 MG PO TABS: 20.0000 mg | ORAL_TABLET | Freq: Every day | ORAL | 1 refills | Status: DC

## 2016-08-02 MED ORDER — LISINOPRIL 10 MG PO TABS: 10.0000 mg | ORAL_TABLET | Freq: Every day | ORAL | 5 refills | Status: DC

## 2016-08-02 NOTE — Telephone Encounter (Signed)
Pt notes new itching around neck after starting losartan and attributes this to the medication.  She has stopped taking it after only 3 days of administration.  Start lisinopril 10 mg once daily for BP management.

## 2016-08-03 ENCOUNTER — Other Ambulatory Visit: Payer: Self-pay | Admitting: Nurse Practitioner

## 2016-08-03 DIAGNOSIS — Z1211 Encounter for screening for malignant neoplasm of colon: Secondary | ICD-10-CM | POA: Diagnosis not present

## 2016-08-03 DIAGNOSIS — Z1212 Encounter for screening for malignant neoplasm of rectum: Secondary | ICD-10-CM | POA: Diagnosis not present

## 2016-08-04 ENCOUNTER — Telehealth: Payer: Self-pay | Admitting: Nurse Practitioner

## 2016-08-04 MED ORDER — AMLODIPINE BESYLATE 5 MG PO TABS: 5.0000 mg | ORAL_TABLET | Freq: Every day | ORAL | 5 refills | Status: DC

## 2016-08-04 NOTE — Telephone Encounter (Addendum)
----   Message from Lonna CobbKelita L Russell, CMA sent at 08/04/2016  8:59 AM EDT ----- Please advise if you would still like to switch her bp med to Lisinopril.   No, pt should stop this medicationand should not get it from the pharmacy.   Please start taking amlodpine 5 mg once daily instead.  She has taken this one in past as prescribed by Bjorn PippinAmy Krebs, NP.

## 2016-08-04 NOTE — Telephone Encounter (Signed)
Attempted to contact the pt, no answer. Unable to leave a message, becaue I get a message stating the [employer] customer is not available at this time.

## 2016-08-05 NOTE — Telephone Encounter (Signed)
The pt was notified about the recommendation to stop Losartan and start on Amlodipine. She verbalize understanding, no questions no questions or concerns.

## 2016-08-11 LAB — COLOGUARD
COLOGUARD: NEGATIVE
Cologuard: NEGATIVE

## 2016-08-16 ENCOUNTER — Ambulatory Visit
Admission: RE | Admit: 2016-08-16 | Discharge: 2016-08-16 | Disposition: A | Discharge status: Home / Self Care | Payer: Medicare Other | Source: Ambulatory Visit | Attending: Nurse Practitioner | Admitting: Nurse Practitioner

## 2016-08-16 DIAGNOSIS — Z13820 Encounter for screening for osteoporosis: Secondary | ICD-10-CM | POA: Diagnosis not present

## 2016-08-16 DIAGNOSIS — M8589 Other specified disorders of bone density and structure, multiple sites: Secondary | ICD-10-CM | POA: Diagnosis not present

## 2016-08-16 DIAGNOSIS — Z780 Asymptomatic menopausal state: Secondary | ICD-10-CM | POA: Diagnosis not present

## 2016-08-16 DIAGNOSIS — R799 Abnormal finding of blood chemistry, unspecified: Secondary | ICD-10-CM

## 2016-08-16 DIAGNOSIS — M810 Age-related osteoporosis without current pathological fracture: Secondary | ICD-10-CM | POA: Insufficient documentation

## 2016-08-16 DIAGNOSIS — Z1231 Encounter for screening mammogram for malignant neoplasm of breast: Secondary | ICD-10-CM | POA: Insufficient documentation

## 2016-08-16 DIAGNOSIS — Z1239 Encounter for other screening for malignant neoplasm of breast: Secondary | ICD-10-CM

## 2016-08-19 ENCOUNTER — Ambulatory Visit: Payer: Medicare Other | Admitting: Nurse Practitioner

## 2016-10-12 ENCOUNTER — Ambulatory Visit (INDEPENDENT_AMBULATORY_CARE_PROVIDER_SITE_OTHER): Payer: Medicare Other | Admitting: Nurse Practitioner

## 2016-10-12 ENCOUNTER — Encounter: Payer: Self-pay | Admitting: Nurse Practitioner

## 2016-10-12 VITALS — BP 216/77 | HR 59 | Temp 97.5°F | Ht 59.0 in | Wt 117.0 lb

## 2016-10-12 DIAGNOSIS — R300 Dysuria: Secondary | ICD-10-CM

## 2016-10-12 DIAGNOSIS — I10 Essential (primary) hypertension: Secondary | ICD-10-CM | POA: Diagnosis not present

## 2016-10-12 DIAGNOSIS — Z136 Encounter for screening for cardiovascular disorders: Secondary | ICD-10-CM

## 2016-10-12 DIAGNOSIS — N3281 Overactive bladder: Secondary | ICD-10-CM | POA: Diagnosis not present

## 2016-10-12 DIAGNOSIS — R1030 Lower abdominal pain, unspecified: Secondary | ICD-10-CM

## 2016-10-12 LAB — POCT URINALYSIS DIPSTICK
Bilirubin, UA: NEGATIVE
Blood, UA: NEGATIVE
Glucose, UA: NEGATIVE
Ketones, UA: NEGATIVE
Leukocytes, UA: NEGATIVE
Nitrite, UA: NEGATIVE
Protein, UA: NEGATIVE
Spec Grav, UA: 1.015 (ref 1.010–1.025)
Urobilinogen, UA: 0.2 E.U./dL
pH, UA: 5 (ref 5.0–8.0)

## 2016-10-12 MED ORDER — OXYBUTYNIN CHLORIDE ER 5 MG PO TB24
5.0000 mg | ORAL_TABLET | Freq: Every day | ORAL | 2 refills | Status: DC
Start: 2016-10-12 — End: 2016-12-06

## 2016-10-12 NOTE — Patient Instructions (Addendum)
Theone, Thank you for coming in to clinic today.  1. For your bladder: - START oxybutynin 5 mg once daily at bedtime for your bladder/pelvic pain. You likely have overactive bladder and this medication will help control your symptoms.  2. Hypertension: High blood pressure - You have to start taking a blood pressure medication or place yourself at very high risk of having a stroke. - START taking amlodipine 5 mg once daily.  Take every day at about the same time each day.  3. Your abdominal pain could also be from an aneurysm, although it is a low risk. - You will get a phone call to schedule this ultrasound. - It will be at [facility] on [facility].   Please schedule a follow-up appointment with [name], AGNP. Return in about 4 weeks (around 11/09/2016) for Blood pressure.   If you have any other questions or concerns, please feel free to call the clinic or send a message through MyChart. You may also schedule an earlier appointment if necessary.  You will receive a survey after today's visit either digitally by e-mail or paper by [employer]. Your experiences and feedback matter to [place].  Please respond so we know how we are doing as we provide care for you.   [name], DNP, AGNP-BC Adult Gerontology Nurse Practitioner [facility], [facility]    Overactive Bladder, Adult Overactive bladder is a group of urinary symptoms. With overactive bladder, you may suddenly feel the need to pass urine (urinate) right away. After feeling this sudden urge, you might also leak urine if you cannot get to the bathroom fast enough (urinary incontinence). These symptoms might interfere with your daily work or social activities. Overactive bladder symptoms may also wake you up at night. Overactive bladder affects the nerve signals between your bladder and your brain. Your bladder may get the signal to empty before it is full. Very sensitive muscles can also make  your bladder squeeze too soon. What are the causes? Many things can cause an overactive bladder. Possible causes include:  Urinary tract infection.  Infection of nearby tissues, such as the prostate.  Prostate enlargement.  Being pregnant with twins or more (multiples).  Surgery on the uterus or urethra.  Bladder stones, inflammation, or tumors.  Drinking too much caffeine or alcohol.  Certain medicines, especially those that you take to help your body get rid of extra fluid (diuretics) by increasing urine production.  Muscle or nerve weakness, especially from: ? A spinal cord injury. ? Stroke. ? Multiple sclerosis. ? Parkinson disease.  Diabetes. This can cause a high urine volume that fills the bladder so quickly that the normal urge to urinate is triggered very strongly.  Constipation. A buildup of too much stool can put pressure on your bladder.  What increases the risk? You may be at greater risk for overactive bladder if you:  Are an older adult.  Smoke.  Are going through menopause.  Have prostate problems.  Have a neurological disease, such as stroke, dementia, Parkinson disease, or multiple sclerosis (MS).  Eat or drink things that irritate the bladder. These include alcohol, spicy food, and caffeine.  Are overweight or obese.  What are the signs or symptoms? The signs and symptoms of an overactive bladder include:  Sudden, strong urges to urinate.  Leaking urine.  Urinating eight or more times per day.  Waking up to urinate two or more times per night.  How is this diagnosed? Your health care  provider may suspect overactive bladder based on your symptoms. The health care provider will do a physical exam and take your medical history. Blood or urine tests may also be done. For example, you might need to have a bladder function test to check how well you can hold your urine. You might also need to see a health care provider who specializes in the  urinary tract (urologist). How is this treated? Treatment for overactive bladder depends on the cause of your condition and whether it is mild or severe. Certain treatments can be done in your health care provider's office or clinic. You can also make lifestyle changes at home. Options include: Behavioral Treatments  Biofeedback. A specialist uses sensors to help you become aware of your body's signals.  Keeping a daily log of when you need to urinate and what happens after the urge. This may help you manage your condition.  Bladder training. This helps you learn to control the urge to urinate by following a schedule that directs you to urinate at regular intervals (timed voiding). At first, you might have to wait a few minutes after feeling the urge. In time, you should be able to schedule bathroom visits an hour or more apart.  Kegel exercises. These are exercises to strengthen the pelvic floor muscles, which support the bladder. Toning these muscles can help you control urination, even if your bladder muscles are overactive. A specialist will teach you how to do these exercises correctly. They require daily practice.  Weight loss. If you are obese or overweight, losing weight might relieve your symptoms of overactive bladder. Talk to your health care provider about losing weight and whether there is a specific program or method that would work best for you.  Diet change. This might help if constipation is making your overactive bladder worse. Your health care provider or a dietitian can explain ways to change what you eat to ease constipation. You might also need to consume less alcohol and caffeine or drink other fluids at different times of the day.  Stopping smoking.  Wearing pads to absorb leakage while you wait for other treatments to take effect. Physical Treatments  Electrical stimulation. Electrodes send gentle pulses of electricity to strengthen the nerves or muscles that help to  control the bladder. Sometimes, the electrodes are placed outside of the body. In other cases, they might be placed inside the body (implanted). This treatment can take several months to have an effect.  Supportive devices. Women may need a plastic device that fits into the vagina and supports the bladder (pessary). Medicines Several medicines can help treat overactive bladder and are usually used along with other treatments. Some are injected into the muscles involved in urination. Others come in pill form. Your health care provider may prescribe:  Antispasmodics. These medicines block the signals that the nerves send to the bladder. This keeps the bladder from releasing urine at the wrong time.  Tricyclic antidepressants. These types of antidepressants also relax bladder muscles.  Surgery  You may have a device implanted to help manage the nerve signals that indicate when you need to urinate.  You may have surgery to implant electrodes for electrical stimulation.  Sometimes, very severe cases of overactive bladder require surgery to change the shape of the bladder. Follow these instructions at home:  Take medicines only as directed by your health care provider.  Use any implants or a pessary as directed by your health care provider.  Make any diet or lifestyle  changes that are recommended by your health care provider. These might include: ? Drinking less fluid or drinking at different times of the day. If you need to urinate often during the night, you may need to stop drinking fluids early in the evening. ? Cutting down on caffeine or alcohol. Both can make an overactive bladder worse. Caffeine is found in coffee, tea, and sodas. ? Doing Kegel exercises to strengthen muscles. ? Losing weight if you need to. ? Eating a healthy and balanced diet to prevent constipation.  Keep a journal or log to track how much and when you drink and also when you feel the need to urinate. This will  help your health care provider to monitor your condition. Contact a health care provider if:  Your symptoms do not get better after treatment.  Your pain and discomfort are getting worse.  You have more frequent urges to urinate.  You have a fever. Get help right away if: You are not able to control your bladder at all. This information is not intended to replace advice given to you by your health care provider. Make sure you discuss any questions you have with your health care provider. Document Released: 10/31/2008 Document Revised: 06/12/2015 Document Reviewed: 05/30/2013 Elsevier Interactive Patient Education  [employer].

## 2016-10-12 NOTE — Progress Notes (Signed)
Subjective:    Patient ID: Madeline Taylor, female    DOB: 09-29-1935, 81 y.o.   MRN: [id number]  Madeline Taylor is a 81 y.o. female presenting on 10/12/2016 for Abdominal Pain (lower abdominal pain, pt states she got a bladder infection x 10days)   HPI Pelvic pain Pain started about 1 week ago.   Has had this pain before - comes and goes about every 6 months.  No vaginal bleeding noted.  Bothers her worst prior to voiding and is associated w/ urinary urgency.  Has had incontinence approximately 2x per day.   Denies increased frequency, burning w/ urination, back pain, flank pain.  Hypertension Pt has not started taking her amlodipine.  Complaints of side effects w/ all of her prior BP medications.  Is not willing to start this medication despite taking it and reportedly tolerating it in past.   Pt denies headache, lightheadedness, dizziness, changes in vision, chest tightness/pressure, palpitations, leg swelling, sudden loss of speech or loss of consciousness.   Social History  Substance Use Topics  . Smoking status: Never Smoker  . Smokeless tobacco: Never Used  . Alcohol use No    Review of Systems Per HPI unless specifically indicated above     Objective:    BP (!) 216/77 (BP Location: Right Arm, Patient Position: Sitting, Cuff Size: Normal)   Pulse (!) 59   Temp (!) 97.5 F (36.4 C) (Oral)   Ht  (1.499 m)   Wt 117 lb (53.1 kg)   BMI 23.63 kg/m    Wt Readings from Last 3 Encounters:  10/12/16 117 lb (53.1 kg)  07/29/16 115 lb 3.2 oz (52.3 kg)  04/26/16 115 lb (52.2 kg)    Physical Exam  General - healthy, well-appearing, NAD HEENT - Normocephalic, atraumatic, PERRL, EOMI, patent nares w/o congestion, oropharynx clear, MMM Neck - supple, non-tender, no LAD, no carotid bruit Heart - RRR, no murmurs heard Lungs - Clear throughout all lobes, no wheezing, crackles, or rhonchi. Normal work of breathing. Abdomen - soft, NTND, no masses, no hepatosplenomegaly,  active bowel sounds, pulsatile abdominal mass noted GU - pt declines pelvic exam Extremeties - non-tender, no edema, cap refill < 2 seconds, peripheral pulses intact +2 bilaterally Skin - warm, dry, no rashes Neuro - awake, alert, oriented x3, normal gait Psych - Normal mood and affect, normal behavior    Results for orders placed or performed in visit on 10/12/16  POCT Urinalysis Dipstick  Result Value Ref Range   Color, UA yellow    Clarity, UA clear    Glucose, UA neg    Bilirubin, UA neg    Ketones, UA neg    Spec Grav, UA 1.015 1.010 - 1.025   Blood, UA neg    pH, UA 5.0 5.0 - 8.0   Protein, UA neg    Urobilinogen, UA 0.2 0.2 or 1.0 E.U./dL   Nitrite, UA neg    Leukocytes, UA Negative Negative      Assessment & Plan:   Problem List Items Addressed This Visit      Cardiovascular and Mediastinum   Essential hypertension    Severe and uncontrolled BP. Not taking any prescribed medications.  Pt previously on multiple antihypertensives w/ reports of side effects with all of them.  Eating moderate to high salt diet.  Plan: 1. Encouraged low salt, low fat diet. 2. START taking amlodipine 5 mg once daily. 3. Check BP at home 1-2 x weekly and keep a log.  4. Reviewed risks of CVA and HTN crisis w/ patient. Pt states she does not want to have a stroke, so she will try the medication.  Also noted long-term damage possible to heart, kidneys, blood vessels that lead to organ failure and can cause AAA. 5. Follow up 4 weeks.        Genitourinary   OAB (overactive bladder)    Pt w/ urinary incontinence associated w/ urgency and pelvic pain.  Pain worse immediately prior to urination.  Pt w/ negative dipstick.  Symptoms indicate likely overactive bladder vs cystitis.  However, pt does still have uterus/ovaries.  Declines pelvic exam today, but was normal at last physical exam.  Plan: 1. Start oxybutynin 5 mg once daily at hs.  Reviewed BEERS criteria, but benefit outweighs  risks. 2. Reassurance provided that no infection is present. 3. Consider urology referral in future.       Other Visit Diagnoses    Dysuria    -  Primary   See AP OAB   Relevant Medications   oxybutynin (DITROPAN-XL) 5 MG 24 hr tablet   Other Relevant Orders   POCT Urinalysis Dipstick (Completed)   Lower abdominal pain       See AP OAB   Relevant Orders   US ABDOMINAL AORTA SCREENING AAA   Encounter for abdominal aortic aneurysm (AAA) screening       Pt w/ untreated HTN and pulsatile abdominal mass.  Needs screening for AAA.  Plan: 1. Abdominal US for AAA ordered. 2. Follow up after result as needed.   Relevant Orders   US ABDOMINAL AORTA SCREENING AAA      Meds ordered this encounter  Medications  . DISCONTD: lisinopril (PRINIVIL,ZESTRIL) 10 MG tablet  . DISCONTD: losartan (COZAAR) 25 MG tablet  . Calcium Carb-Cholecalciferol (CALCIUM-VITAMIN D) 500-200 MG-UNIT tablet    Sig: Take 1 tablet by mouth daily.  Marland Kitchen oxybutynin (DITROPAN-XL) 5 MG 24 hr tablet    Sig: Take 1 tablet (5 mg total) by mouth at bedtime.    Dispense:  30 tablet    Refill:  2      Follow up plan: Return in about 4 weeks (around 11/09/2016) for Blood pressure.   [name], DNP, AGPCNP-BC Adult Gerontology Primary Care Nurse Practitioner [facility] 10/16/2016, 8:15 PM

## 2016-10-16 DIAGNOSIS — N3281 Overactive bladder: Secondary | ICD-10-CM | POA: Insufficient documentation

## 2016-10-16 NOTE — Assessment & Plan Note (Addendum)
Severe and uncontrolled BP. Not taking any prescribed medications.  Pt previously on multiple antihypertensives w/ reports of side effects with all of them.  Eating moderate to high salt diet.  Plan: 1. Encouraged low salt, low fat diet. 2. START taking amlodipine 5 mg once daily. 3. Check BP at home 1-2 x weekly and keep a log. 4. Reviewed risks of CVA and HTN crisis w/ patient. Pt states she does not want to have a stroke, so she will try the medication.  Also noted long-term damage possible to heart, kidneys, blood vessels that lead to organ failure and can cause AAA. 5. Follow up 4 weeks.

## 2016-10-16 NOTE — Assessment & Plan Note (Signed)
Pt w/ urinary incontinence associated w/ urgency and pelvic pain.  Pain worse immediately prior to urination.  Pt w/ negative dipstick.  Symptoms indicate likely overactive bladder vs cystitis.  However, pt does still have uterus/ovaries.  Declines pelvic exam today, but was normal at last physical exam.  Plan: 1. Start oxybutynin 5 mg once daily at hs.  Reviewed BEERS criteria, but benefit outweighs risks. 2. Reassurance provided that no infection is present. 3. Consider urology referral in future.

## 2016-10-26 ENCOUNTER — Other Ambulatory Visit: Payer: Self-pay | Admitting: Nurse Practitioner

## 2016-10-26 ENCOUNTER — Other Ambulatory Visit: Payer: Self-pay

## 2016-10-26 DIAGNOSIS — R1030 Lower abdominal pain, unspecified: Secondary | ICD-10-CM

## 2016-10-26 DIAGNOSIS — Z136 Encounter for screening for cardiovascular disorders: Secondary | ICD-10-CM

## 2016-10-26 DIAGNOSIS — R1907 Generalized intra-abdominal and pelvic swelling, mass and lump: Secondary | ICD-10-CM

## 2016-10-27 ENCOUNTER — Ambulatory Visit: Payer: Medicare Other

## 2016-11-01 DIAGNOSIS — Z23 Encounter for immunization: Secondary | ICD-10-CM | POA: Diagnosis not present

## 2016-11-04 ENCOUNTER — Ambulatory Visit (INDEPENDENT_AMBULATORY_CARE_PROVIDER_SITE_OTHER): Payer: Medicare Other | Admitting: Nurse Practitioner

## 2016-11-04 ENCOUNTER — Encounter: Payer: Self-pay | Admitting: Nurse Practitioner

## 2016-11-04 VITALS — BP 209/72 | HR 55 | Temp 97.7°F | Ht <= 58 in | Wt 116.6 lb

## 2016-11-04 DIAGNOSIS — I10 Essential (primary) hypertension: Secondary | ICD-10-CM

## 2016-11-04 DIAGNOSIS — E782 Mixed hyperlipidemia: Secondary | ICD-10-CM

## 2016-11-04 MED ORDER — EZETIMIBE 10 MG PO TABS: 10.0000 mg | ORAL_TABLET | Freq: Every day | ORAL | 5 refills | Status: DC

## 2016-11-04 MED ORDER — LOSARTAN POTASSIUM 50 MG PO TABS: 50.0000 mg | ORAL_TABLET | Freq: Every day | ORAL | 5 refills | Status: DC

## 2016-11-04 NOTE — Assessment & Plan Note (Signed)
Uncontrolled at last check of lipids.  Not tolerant to statin r/t myalgias and will not start another.  Has previously taken ezetimibe and had more than one medication fill in past.  Possibly will tolerate this medication.  Plan: 1. Resume fish oil 1,000 mg once daily. 2. Check lipid panel again about 3 months. 3. Follow up 3 months.

## 2016-11-04 NOTE — Progress Notes (Signed)
Subjective:    Patient ID: Madeline Taylor, female    DOB: 27-Dec-1935, 81 y.o.   MRN: [id number]  Madeline Taylor is a 81 y.o. female presenting on 11/04/2016 for Hypertension   HPI Hypertension - She is not checking BP at home or outside of clinic.    - Current medications: none currently.  Pt has very irregular dosing or is not taking any medications regularly.  Remaining in bottle are approximately 20 tablets since last fill in July, is seemingly taking amlodipine most and is tolerating well without side effects when she takes them.  Had previously reported itching around her neck w/ losartan, but may not be correlated. Pt could not remember the reaction to losartan and is willing to try again. - She is not currently symptomatic. - Pt denies headache, lightheadedness, dizziness, changes in vision, chest tightness/pressure, palpitations, leg swelling, sudden loss of speech or loss of consciousness. - She  reports no regular exercise routine. - Her diet is high in salt, high in fat, and moderate in carbohydrates.   Bladder Pain Has not started taking oxybutynin.  Still has bladder pain and pressure c/w spasm.   Social History  Substance Use Topics  . Smoking status: Never Smoker  . Smokeless tobacco: Never Used  . Alcohol use No    Review of Systems Per HPI unless specifically indicated above     Objective:    BP (!) 209/72 (BP Location: Right Arm, Patient Position: Sitting, Cuff Size: Small)   Pulse (!) 55   Temp 97.7 F (36.5 C) (Oral)   Ht 4\' 9"  (1.448 m)   Wt 116 lb 9.6 oz (52.9 kg)   BMI 25.23 kg/m   BP recheck 194/110  Wt Readings from Last 3 Encounters:  11/04/16 116 lb 9.6 oz (52.9 kg)  10/12/16 117 lb (53.1 kg)  07/29/16 115 lb 3.2 oz (52.3 kg)    Physical Exam General - healthy, well-appearing, NAD HEENT - Normocephalic, atraumatic Neck - supple, non-tender, no LAD, no carotid bruit Heart - RRR, no murmurs heard Lungs - Clear throughout all lobes, no  wheezing, crackles, or rhonchi. Normal work of breathing. Extremeties - non-tender, no edema, cap refill < 2 seconds, peripheral pulses intact +2 bilaterally Skin - warm, dry Neuro - awake, alert, oriented x3, normal gait Psych - Normal mood and affect, normal behavior    Results for orders placed or performed in visit on 10/12/16  POCT Urinalysis Dipstick  Result Value Ref Range   Color, UA yellow    Clarity, UA clear    Glucose, UA neg    Bilirubin, UA neg    Ketones, UA neg    Spec Grav, UA 1.015 1.010 - 1.025   Blood, UA neg    pH, UA 5.0 5.0 - 8.0   Protein, UA neg    Urobilinogen, UA 0.2 0.2 or 1.0 E.U./dL   Nitrite, UA neg    Leukocytes, UA Negative Negative      Assessment & Plan:   Problem List Items Addressed This Visit      Cardiovascular and Mediastinum   Essential hypertension    Severe and uncontrolled BP worsening since last visit w/ med management. Not taking any prescribed medications regularly.  Eating moderate to high salt/high fat diet, but lots of vegetables.  Plan: 1. Encouraged low salt, low fat diet. 2. START taking amlodipine 5 mg once daily. 3. Check BP at home 1-2 x weekly and keep a log. 4. Reviewed risks of  CVA and HTN crisis w/ patient. Pt states she does not want to have a stroke, so she will try the medication. 5. Follow up 4 weeks.      Relevant Medications   aspirin 325 MG tablet   losartan (COZAAR) 50 MG tablet   ezetimibe (ZETIA) 10 MG tablet     Other   Hyperlipidemia - Primary    Uncontrolled at last check of lipids.  Not tolerant to statin r/t myalgias and will not start another.  Has previously taken ezetimibe and had more than one medication fill in past.  Possibly will tolerate this medication.  Plan: 1. Resume fish oil 1,000 mg once daily. 2. Check lipid panel again about 3 months. 3. Follow up 3 months.      Relevant Medications   aspirin 325 MG tablet   losartan (COZAAR) 50 MG tablet   ezetimibe (ZETIA) 10 MG  tablet      Meds ordered this encounter  Medications  . DISCONTD: lisinopril (PRINIVIL,ZESTRIL) 10 MG tablet    Sig: Take 10 mg by mouth daily.  Marland Kitchen. aspirin 325 MG tablet    Sig: Take 325 mg by mouth daily.  . cetirizine (ZYRTEC) 10 MG tablet    Sig: Take 10 mg by mouth daily.  Marland Kitchen. losartan (COZAAR) 50 MG tablet    Sig: Take 1 tablet (50 mg total) by mouth daily.    Dispense:  30 tablet    Refill:  5    Refill when pt requests refill.  Do not fill today.    Order Specific Question:   Supervising Provider    Answer:   Smitty CordsKARAMALEGOS, ALEXANDER J [[id number]]  . ezetimibe (ZETIA) 10 MG tablet    Sig: Take 1 tablet (10 mg total) by mouth daily.    Dispense:  30 tablet    Refill:  5    Order Specific Question:   Supervising Provider    Answer:   Smitty CordsKARAMALEGOS, ALEXANDER J [[id number]]      Follow up plan: Return in about 4 weeks (around 12/02/2016) for blood pressure.  Wilhelmina McardleLauren Loriel Diehl, DNP, AGPCNP-BC Adult Gerontology Primary Care Nurse Practitioner [facility] 11/04/2016, 1:13 PM

## 2016-11-04 NOTE — Assessment & Plan Note (Addendum)
Severe and uncontrolled BP worsening since last visit w/ med management. Not taking any prescribed medications regularly.  Eating moderate to high salt/high fat diet, but lots of vegetables.  Plan: 1. Encouraged low salt, low fat diet. 2. START taking amlodipine 5 mg once daily. 3. Check BP at home 1-2 x weekly and keep a log. 4. Reviewed risks of CVA and HTN crisis w/ patient. Pt states she does not want to have a stroke, so she will try the medication.  Stressed importance of taking medications daily. 5. Follow up 4 weeks.

## 2016-11-04 NOTE — Patient Instructions (Addendum)
Madeline Taylor, Thank you for coming in to clinic today.  Take your medications EVERY DAY!  1. For your blood pressure: - TAKE losartan 50 mg once daily. - TAKE amlodipine 50 mg once daily.  2. For your cholesterol: - START ezetimibe 10 mg once daily.  3. For your bladder: - TAKE oxybutynin 5 mg once at bedtime.  Eat a healthy diet low in carbohydrates, fried foods, meat fat.  Eat lots of vegetables and fruit instead of sweets.   Please schedule a follow-up appointment with Wilhelmina McardleLauren Coby Shrewsberry, AGNP. Return in about 4 weeks (around 12/02/2016) for blood pressure.  If you have any other questions or concerns, please feel free to call the clinic or send a message through MyChart. You may also schedule an earlier appointment if necessary.  You will receive a survey after today's visit either digitally by e-mail or paper by [employer] mail. Your experiences and feedback matter to us.  Please respond so we know how we are doing as we provide care for you.   Wilhelmina McardleLauren Michai Dieppa, DNP, AGNP-BC Adult Gerontology Nurse Practitioner [facility], [facility]

## 2016-12-06 ENCOUNTER — Ambulatory Visit (INDEPENDENT_AMBULATORY_CARE_PROVIDER_SITE_OTHER): Payer: Medicare Other | Admitting: Nurse Practitioner

## 2016-12-06 ENCOUNTER — Encounter: Payer: Self-pay | Admitting: Nurse Practitioner

## 2016-12-06 ENCOUNTER — Other Ambulatory Visit: Payer: Self-pay

## 2016-12-06 VITALS — BP 166/94 | HR 63 | Temp 97.8°F | Ht <= 58 in | Wt 116.6 lb

## 2016-12-06 DIAGNOSIS — I10 Essential (primary) hypertension: Secondary | ICD-10-CM

## 2016-12-06 DIAGNOSIS — N393 Stress incontinence (female) (male): Secondary | ICD-10-CM

## 2016-12-06 DIAGNOSIS — M810 Age-related osteoporosis without current pathological fracture: Secondary | ICD-10-CM | POA: Diagnosis not present

## 2016-12-06 DIAGNOSIS — E782 Mixed hyperlipidemia: Secondary | ICD-10-CM

## 2016-12-06 NOTE — Assessment & Plan Note (Signed)
Improved BP, but still uncontrolled.  Pt reports good BP readings at home in 140s SBP w/ occasional dizziness.  Pt w/ improved med management, but still intermittent dosing.  Pt believes neck rash is due to medication, but doesn't know which one.     Plan: 1. Encouraged low salt, low fat, moderate carb diet. 2. Pt cannot remember most recent medication taken. - Should continue losartan 50 mg once daily. - STOP amlodipine for now since BP may be too low if taking both together. 3. Check BP at home 1-2 x weekly and keep a log.  Reviewed BP goal of less than 140/90. 4. Followup 3 months and sooner on phone if needed.  Consider home health for med management.

## 2016-12-06 NOTE — Patient Instructions (Addendum)
[name], Thank you for coming in to clinic today.  START your medications one at a time for two weeks each.   When you start a medication and it does not cause a rash, continue taking it as you add the next medication. 1. START by taking losartan 50 mg once daily 2. THEN, if no rash continue losartan and ADD ezetimibe 10 mg once daily 3. THEN, if no rash continue losartan and ezetimibe.     STOP oxybutynin. (bladder)  It is not helping. STOP amlodipine. (you may not need this for blood pressure).  Blood pressure goal is less than 140/90.  - You may have something else causing your rash that is not related to your medications.  For allergies, you can take cetirizine (Zyrtec) 10 mg once daily.   Please schedule a follow-up appointment with Madeline Taylor, AGNP. Return in about 3 months (around 03/08/2017) for Blood Pressure.  If you have any other questions or concerns, please feel free to call the clinic or send a message through MyChart. You may also schedule an earlier appointment if necessary.  You will receive a survey after today's visit either digitally by e-mail or paper by [employer] mail. Your experiences and feedback matter to us.  Please respond so we know how we are doing as we provide care for you.   Madeline McardleLauren Nelsy Madonna, DNP, AGNP-BC Adult Gerontology Nurse Practitioner [facility], [facility]

## 2016-12-06 NOTE — Progress Notes (Signed)
Subjective:    Patient ID: Madeline Taylor, female    DOB: [birth year], 81 y.o.   MRN: [id number]  Madeline Cooteroyoko T Levett is a 81 y.o. female presenting on 12/06/2016 for Hypertension (dizzy spells )   HPI Hypertension - She is checking BP at home or outside of clinic.  Readings 140-147/60s - Current medications: Pt is poor historian for medications.  Believes a medication is causing a neck rash, so she has been taking one medication for 4-5 days, then taking another for 4-5 days.  She believes the rash is coming from her mucinex OTC today. - She is symptomatic with intermittent dizziness. - Pt denies headache, lightheadedness, changes in vision, chest tightness/pressure, palpitations, leg swelling, sudden loss of speech or loss of consciousness. - She  reports no regular exercise routine. - Her diet is moderate in salt, moderate in fat, and high in carbohydrates.  Dizziness - Has been using cane occasionally and is helping her balance. - Dizziness lasts seconds, intermittently when she gets up too fast.  Hip Joint - R side - Has constant pain in R hip some days worse than other days.  Cannot walk long distances.  Pt also asks about her bone density report results.  Social History   Tobacco Use  . Smoking status: Never Smoker  . Smokeless tobacco: Never Used  Substance Use Topics  . Alcohol use: No  . Drug use: No    Review of Systems Per HPI unless specifically indicated above     Objective:    BP (!) 166/94 (BP Location: Right Arm, Patient Position: Sitting, Cuff Size: Normal)   Pulse 63   Temp 97.8 F (36.6 C) (Oral)   Ht 4\' 9"  (1.448 m)   Wt 116 lb 9.6 oz (52.9 kg)   BMI 25.23 kg/m   Recheck 138/72  Wt Readings from Last 3 Encounters:  12/06/16 116 lb 9.6 oz (52.9 kg)  11/04/16 116 lb 9.6 oz (52.9 kg)  10/12/16 117 lb (53.1 kg)    Physical Exam  General - healthy, well-appearing, NAD HEENT - Normocephalic, atraumatic Neck - supple, non-tender, no LAD, no  thyromegaly, no carotid bruit Heart - RRR, no murmurs heard Lungs - Clear throughout all lobes, no wheezing, crackles, or rhonchi. Normal work of breathing. Extremeties - non-tender, no edema, cap refill < 2 seconds, peripheral pulses intact +2 bilaterally Skin - warm, dry Neuro - awake, alert, oriented x3,  Normal gait w/ occasional side step for balance - wears high heel wedge shoes w/ 4" heel.  Negative Romberg Psych - Normal mood and affect, normal behavior    Results for orders placed or performed in visit on 10/12/16  POCT Urinalysis Dipstick  Result Value Ref Range   Color, UA yellow    Clarity, UA clear    Glucose, UA neg    Bilirubin, UA neg    Ketones, UA neg    Spec Grav, UA 1.015 1.010 - 1.025   Blood, UA neg    pH, UA 5.0 5.0 - 8.0   Protein, UA neg    Urobilinogen, UA 0.2 0.2 or 1.0 E.U./dL   Nitrite, UA neg    Leukocytes, UA Negative Negative      Assessment & Plan:   Problem List Items Addressed This Visit      Cardiovascular and Mediastinum   Essential hypertension    Improved BP, but still uncontrolled.  Pt reports good BP readings at home in 140s SBP w/ occasional dizziness.  Pt w/  improved med management, but still intermittent dosing.  Pt believes neck rash is due to medication, but doesn't know which one.     Plan: 1. Encouraged low salt, low fat, moderate carb diet. 2. Pt cannot remember most recent medication taken. - Should continue losartan 50 mg once daily. - STOP amlodipine for now since BP may be too low if taking both together. 3. Check BP at home 1-2 x weekly and keep a log.  Reviewed BP goal of less than 140/90. 4. Followup 3 months and sooner on phone if needed.  Consider home health for med management.        Musculoskeletal and Integument   Osteoporosis of femur without pathological fracture    Pt w/ repeat DEXA on 08/16/2016.  Pt requests results and to discuss plan of care.  Worsening bone density to osteoporosis w/ decrease of 8.55%  in 4 years.  Pt is not currently taking any calcium or other bone therapies.  Right hip pain likely related to osteoarthritis.  Plan: 1. Discussed treatment options w/ pt and she currently declines all therapies. 2. Encouraged regular weight bearing exercise and adequate calcium and vitamin D in diet.  Consider adding calcium and Vitamin D supplement. 3. Followup 1 year.        Other   Hyperlipidemia - Primary    Uncontrolled at last check of lipids.  Not tolerant to statin r/t myalgias and will not start another.  Has previously taken ezetimibe and had more than one medication fill in past.  Possibly will tolerate this medication and agrees to start today.  Plan: 1. Resume fish oil 1,000 mg once daily. 2. START ezetimibe 10 mg once daily.  Pt has recent prescription at home, but has not been taking it.  States she will start. 3. Follow up 3 months.      Urinary, incontinence, stress female    Pt w/ continued symptoms of OAB vs stress incontinence.  She reports oxybutynin is not helping her symptoms and she is also a little more dizzy.  Plan: 1. STOP oxybutynin as benefit does not outweigh risk of dizziness and possible fall from medication. 2. Continue work to ensure regular void times about every 3-4 hours. 3. Follow up as needed.         No orders of the defined types were placed in this encounter.    Follow up plan: Return in about 3 months (around 03/08/2017) for Blood Pressure.  Wilhelmina McardleLauren Shaely Gadberry, DNP, AGPCNP-BC Adult Gerontology Primary Care Nurse Practitioner [facility] 12/16/2016, 12:00 AM

## 2016-12-07 ENCOUNTER — Telehealth: Payer: Self-pay | Admitting: Nurse Practitioner

## 2016-12-07 NOTE — Telephone Encounter (Signed)
error 

## 2016-12-15 NOTE — Assessment & Plan Note (Signed)
Pt w/ continued symptoms of OAB vs stress incontinence.  She reports oxybutynin is not helping her symptoms and she is also a little more dizzy.  Plan: 1. STOP oxybutynin as benefit does not outweigh risk of dizziness and possible fall from medication. 2. Continue work to ensure regular void times about every 3-4 hours. 3. Follow up as needed.

## 2016-12-15 NOTE — Assessment & Plan Note (Addendum)
Uncontrolled at last check of lipids.  Not tolerant to statin r/t myalgias and will not start another.  Has previously taken ezetimibe and had more than one medication fill in past.  Possibly will tolerate this medication and agrees to start today.  Plan: 1. Resume fish oil 1,000 mg once daily. 2. START ezetimibe 10 mg once daily.  Pt has recent prescription at home, but has not been taking it.  States she will start. 3. Follow up 3 months.

## 2016-12-16 ENCOUNTER — Encounter: Payer: Self-pay | Admitting: Nurse Practitioner

## 2016-12-16 NOTE — Assessment & Plan Note (Signed)
Pt w/ repeat DEXA on 08/16/2016.  Pt requests results and to discuss plan of care.  Worsening bone density to osteoporosis w/ decrease of 8.55% in 4 years.  Pt is not currently taking any calcium or other bone therapies.  Plan: 1. Discussed treatment options w/ pt and she currently declines all therapies. 2. Encouraged regular weight bearing exercise and adequate calcium and vitamin D in diet.  Consider adding calcium and Vitamin D supplement. 3. Followup 1 year.

## 2017-08-09 ENCOUNTER — Telehealth: Payer: Self-pay | Admitting: Nurse Practitioner

## 2017-08-09 NOTE — Telephone Encounter (Signed)
Called to schedule Medicare Annual Wellness Visit with Nurse Health Advisor. If patient returns call, please schedule AWV with NHA any date  °Thank you! °For any questions please contact: °Kathryn Brown [phone]  °Or Skype me at: [email or web address]  ° ° °

## 2017-08-30 NOTE — Telephone Encounter (Signed)
2nd attempt

## 2017-09-21 DIAGNOSIS — M7541 Impingement syndrome of right shoulder: Secondary | ICD-10-CM | POA: Diagnosis not present

## 2017-10-25 ENCOUNTER — Ambulatory Visit: Payer: Medicare Other

## 2017-11-03 DIAGNOSIS — Z23 Encounter for immunization: Secondary | ICD-10-CM | POA: Diagnosis not present

## 2017-11-08 ENCOUNTER — Ambulatory Visit (INDEPENDENT_AMBULATORY_CARE_PROVIDER_SITE_OTHER): Payer: Medicare Other

## 2017-11-08 VITALS — BP 180/76 | HR 68 | Temp 98.6°F | Resp 16 | Ht <= 58 in | Wt 104.2 lb

## 2017-11-08 DIAGNOSIS — Z0000 Encounter for general adult medical examination without abnormal findings: Secondary | ICD-10-CM

## 2017-11-08 NOTE — Progress Notes (Signed)
Subjective:   Madeline Taylor is a 82 y.o. female who presents for Medicare Annual (Subsequent) preventive examination.  Review of Systems:  Cardiac Risk Factors include: advanced age (>53men, >7 women);hypertension;dyslipidemia     Objective:     Vitals: BP (!) 180/76 (BP Location: Left Arm, Patient Position: Sitting)   Pulse 68   Temp 98.6 F (37 C) (Oral)   Resp 16   Ht 4\' 9"  (1.448 m)   Wt 104 lb 3.2 oz (47.3 kg)   BMI 22.55 kg/m   Body mass index is 22.55 kg/m.  Advanced Directives 11/08/2017  Does Patient Have a Medical Advance Directive? Yes  Type of Advance Directive Living will    Tobacco Social History   Tobacco Use  Smoking Status Never Smoker  Smokeless Tobacco Never Used     Counseling given: Not Answered   Clinical Intake:  Pre-visit preparation completed: No  Pain : No/denies pain     Nutritional Status: BMI of 19-24  Normal Nutritional Risks: None Diabetes: No  How often do you need to have someone help you when you read instructions, pamphlets, or other written materials from your doctor or pharmacy?: 1 - Never What is the last grade level you completed in school?: 12th grade  Interpreter Needed?: No  Information entered by :: Madeline Hill,lPN   Past Medical History:  Diagnosis Date  . Essential hypertension 07/29/2016  . History of vertigo 07/29/2016  . Hyperlipidemia 07/29/2016  . Osteoarthritis of back 07/29/2016  . Osteopenia of multiple sites 07/29/2016  . Seasonal allergies 07/29/2016  . Urinary, incontinence, stress female 07/29/2016   Past Surgical History:  Procedure Laterality Date  . no past surgery     Family History  Problem Relation Age of Onset  . Breast cancer Sister   . Cancer Sister 62       breast cancer  . Alzheimer's disease Mother   . Healthy Father    Social History   Socioeconomic History  . Marital status: Married    Spouse name: Not on file  . Number of children: Not on file  . Years of  education: Not on file  . Highest education level: 12th grade  Occupational History  . Not on file  Social Needs  . Financial resource strain: Not hard at all  . Food insecurity:    Worry: Never true    Inability: Never true  . Transportation needs:    Medical: No    Non-medical: No  Tobacco Use  . Smoking status: Never Smoker  . Smokeless tobacco: Never Used  Substance and Sexual Activity  . Alcohol use: No  . Drug use: No  . Sexual activity: Not Currently    Birth control/protection: None, Post-menopausal  Lifestyle  . Physical activity:    Days per week: 0 days    Minutes per session: 0 min  . Stress: Not at all  Relationships  . Social connections:    Talks on phone: More than three times a week    Gets together: More than three times a week    Attends religious service: More than 4 times per year    Active member of club or organization: No    Attends meetings of clubs or organizations: Never    Relationship status: Married  Other Topics Concern  . Not on file  Social History Narrative  . Not on file    Outpatient Encounter Medications as of 11/08/2017  Medication Sig  . aspirin 325 MG  tablet Take 325 mg by mouth daily.  . Calcium Carb-Cholecalciferol (CALCIUM-VITAMIN D) 500-200 MG-UNIT tablet Take 1 tablet by mouth daily.  . cetirizine (ZYRTEC) 10 MG tablet Take 10 mg by mouth daily.  Marland Kitchen ezetimibe (ZETIA) 10 MG tablet Take 1 tablet (10 mg total) by mouth daily. (Patient not taking: Reported on 12/06/2016)  . losartan (COZAAR) 50 MG tablet Take 1 tablet (50 mg total) by mouth daily. (Patient not taking: Reported on 11/08/2017)   No facility-administered encounter medications on file as of 11/08/2017.     Activities of Daily Living In your present state of health, do you have any difficulty performing the following activities: 11/08/2017  Hearing? N  Comment declines hearing aids   Vision? N  Comment wears glasses  Difficulty concentrating or making  decisions? N  Walking or climbing stairs? N  Dressing or bathing? N  Doing errands, shopping? N  Preparing Food and eating ? N  Using the Toilet? N  In the past six months, have you accidently leaked urine? N  Do you have problems with loss of bowel control? N  Managing your Medications? N  Managing your Finances? N  Housekeeping or managing your Housekeeping? N  Some recent data might be hidden    Patient Care Team: Galen Manila, NP as PCP - General (Nurse Practitioner)    Assessment:   This is a routine wellness examination for Madeline Taylor.  Exercise Activities and Dietary recommendations Current Exercise Habits: The patient does not participate in regular exercise at present, Exercise limited by: None identified  Goals    . DIET - INCREASE WATER INTAKE     Recommend drinking at least 6-8 glasses of water a day        Fall Risk Fall Risk  11/08/2017 11/04/2016  Falls in the past year? No No   FALL RISK PREVENTION PERTAINING TO THE HOME:  Any stairs in or around the home WITH handrails? No  Home free of loose throw rugs in walkways, pet beds, electrical cords, etc? Yes  Adequate lighting in your home to reduce risk of falls? Yes   ASSISTIVE DEVICES UTILIZED TO PREVENT FALLS:  Life alert? No  Use of a cane, walker or w/c? Yes  Grab bars in the bathroom? No  Shower chair or bench in shower? No  Elevated toilet seat or a handicapped toilet? No   DME ORDERS:  DME order needed?  No   TIMED UP AND GO:  Was the test performed? Yes .  Length of time to ambulate 10 feet: 8 sec.   GAIT:  Appearance of gait: Gait stead-fast without the use of an assistive device. Education: Fall risk prevention has been discussed.  Intervention(s) required? No    Depression Screen PHQ 2/9 Scores 11/08/2017 11/04/2016 07/29/2016  PHQ - 2 Score 0 0 0  PHQ- 9 Score - - 0     Cognitive Function     6CIT Screen 11/08/2017  What Year? 0 points  What month? 0 points  What  time? 0 points  Count back from 20 0 points  Months in reverse 0 points  Repeat phrase 2 points  Total Score 2    Immunization History  Administered Date(s) Administered  . Influenza-Unspecified 11/03/2017  . Pneumococcal Conjugate-13 10/18/2013  . Pneumococcal Polysaccharide-23 10/19/2014  . Tdap 02/03/2012    Qualifies for Shingles Vaccine? Yes  . Due for Shingrix. Education has been provided regarding the importance of this vaccine. Pt has been advised to call insurance  company to determine out of pocket expense. Advised may also receive vaccine at local pharmacy or Health Dept. Verbalized acceptance and understanding.  Tdap: up to date  Flu Vaccine: completed   Pneumococcal Vaccine: completed series   Screening Tests Health Maintenance  Topic Date Due  . TETANUS/TDAP  10/11/1956  . PNA vac Low Risk Adult (1 of 2 - PCV13) 10/12/2002  . INFLUENZA VACCINE  08/18/2017  . DEXA SCAN  Completed    Cancer Screenings:  Colorectal Screening:No longer required.   Mammogram: Completed 08/16/2016 No longer required.  Bone Density: Completed 08/16/2016.   Lung Cancer Screening: (Low Dose CT Chest recommended if Age 94-80 years, 30 pack-year currently smoking OR have quit w/in 15years.) does not qualify.    Additional Screening:  Hepatitis C Screening: does not qualify  Vision Screening: Recommended annual ophthalmology exams for early detection of glaucoma and other disorders of the eye. Is the patient up to date with their annual eye exam?  No     Dental Screening: Recommended annual dental exams for proper oral hygiene  Community Resource Referral:  CRR required this visit?  No      Plan:    I have personally reviewed and addressed the Medicare Annual Wellness questionnaire and have noted the following in the patient's chart:  A. Medical and social history B. Use of alcohol, tobacco or illicit drugs  C. Current medications and supplements D. Functional ability  and status E.  Nutritional status F.  Physical activity G. Advance directives H. List of other physicians I.  Hospitalizations, surgeries, and ER visits in previous 12 months J.  Vitals K. Screenings such as hearing and vision if needed, cognitive and depression L. Referrals and appointments   In addition, I have reviewed and discussed with patient certain preventive protocols, quality metrics, and best practice recommendations. A written personalized care plan for preventive services as well as general preventive health recommendations were provided to patient.   Signed,  Marin Roberts, LPN Nurse Health Advisor   Nurse Notes:none

## 2017-11-08 NOTE — Patient Instructions (Signed)
Madeline Taylor , Thank you for taking time to come for your Medicare Wellness Visit. I appreciate your ongoing commitment to your health goals. Please review the following plan we discussed and let me know if I can assist you in the future.   Screening recommendations/referrals: Colonoscopy: no longer required Mammogram: no longer required Bone Density: completed 08/16/2016, no longer required  Recommended yearly ophthalmology/optometry visit for glaucoma screening and checkup Recommended yearly dental visit for hygiene and checkup  Vaccinations: Influenza vaccine: completed  Pneumococcal vaccine: completed series Tdap vaccine: up to date Shingles vaccine: shingrix eligible, check with your insurance company for coverage     Advanced directives: Please bring a copy of your health care power of attorney and living will to the office at your convenience.  Conditions/risks identified: Recommend drinking at least 6-8 glasses of water a day   Next appointment: Follow up in one year for your annual wellness exam.    Preventive Care 65 Years and Older, Female Preventive care refers to lifestyle choices and visits with your health care provider that can promote health and wellness. What does preventive care include?  A yearly physical exam. This is also called an annual well check.  Dental exams once or twice a year.  Routine eye exams. Ask your health care provider how often you should have your eyes checked.  Personal lifestyle choices, including:  Daily care of your teeth and gums.  Regular physical activity.  Eating a healthy diet.  Avoiding tobacco and drug use.  Limiting alcohol use.  Practicing safe sex.  Taking low-dose aspirin every day.  Taking vitamin and mineral supplements as recommended by your health care provider. What happens during an annual well check? The services and screenings done by your health care provider during your annual well check will depend on your  age, overall health, lifestyle risk factors, and family history of disease. Counseling  Your health care provider may ask you questions about your:  Alcohol use.  Tobacco use.  Drug use.  Emotional well-being.  Home and relationship well-being.  Sexual activity.  Eating habits.  History of falls.  Memory and ability to understand (cognition).  Work and work Astronomer.  Reproductive health. Screening  You may have the following tests or measurements:  Height, weight, and BMI.  Blood pressure.  Lipid and cholesterol levels. These may be checked every 5 years, or more frequently if you are over 63 years old.  Skin check.  Lung cancer screening. You may have this screening every year starting at age 8 if you have a 30-pack-year history of smoking and currently smoke or have quit within the past 15 years.  Fecal occult blood test (FOBT) of the stool. You may have this test every year starting at age 70.  Flexible sigmoidoscopy or colonoscopy. You may have a sigmoidoscopy every 5 years or a colonoscopy every 10 years starting at age 12.  Hepatitis C blood test.  Hepatitis B blood test.  Sexually transmitted disease (STD) testing.  Diabetes screening. This is done by checking your blood sugar (glucose) after you have not eaten for a while (fasting). You may have this done every 1-3 years.  Bone density scan. This is done to screen for osteoporosis. You may have this done starting at age 16.  Mammogram. This may be done every 1-2 years. Talk to your health care provider about how often you should have regular mammograms. Talk with your health care provider about your test results, treatment options, and if  necessary, the need for more tests. Vaccines  Your health care provider may recommend certain vaccines, such as:  Influenza vaccine. This is recommended every year.  Tetanus, diphtheria, and acellular pertussis (Tdap, Td) vaccine. You may need a Td booster  every 10 years.  Zoster vaccine. You may need this after age 75.  Pneumococcal 13-valent conjugate (PCV13) vaccine. One dose is recommended after age 33.  Pneumococcal polysaccharide (PPSV23) vaccine. One dose is recommended after age 12. Talk to your health care provider about which screenings and vaccines you need and how often you need them. This information is not intended to replace advice given to you by your health care provider. Make sure you discuss any questions you have with your health care provider. Document Released: 01/31/2015 Document Revised: 09/24/2015 Document Reviewed: 11/05/2014 Elsevier Interactive Patient Education  2017 [place] Prevention in the Home Falls can cause injuries. They can happen to people of all ages. There are many things you can do to make your home safe and to help prevent falls. What can I do on the outside of my home?  Regularly fix the edges of walkways and driveways and fix any cracks.  Remove anything that might make you trip as you walk through a door, such as a raised step or threshold.  Trim any bushes or trees on the path to your home.  Use bright outdoor lighting.  Clear any walking paths of anything that might make someone trip, such as rocks or tools.  Regularly check to see if handrails are loose or broken. Make sure that both sides of any steps have handrails.  Any raised decks and porches should have guardrails on the edges.  Have any leaves, snow, or ice cleared regularly.  Use sand or salt on walking paths during winter.  Clean up any spills in your garage right away. This includes oil or grease spills. What can I do in the bathroom?  Use night lights.  Install grab bars by the toilet and in the tub and shower. Do not use towel bars as grab bars.  Use non-skid mats or decals in the tub or shower.  If you need to sit down in the shower, use a plastic, non-slip stool.  Keep the floor dry. Clean up any  water that spills on the floor as soon as it happens.  Remove soap buildup in the tub or shower regularly.  Attach bath mats securely with double-sided non-slip rug tape.  Do not have throw rugs and other things on the floor that can make you trip. What can I do in the bedroom?  Use night lights.  Make sure that you have a light by your bed that is easy to reach.  Do not use any sheets or blankets that are too big for your bed. They should not hang down onto the floor.  Have a firm chair that has side arms. You can use this for support while you get dressed.  Do not have throw rugs and other things on the floor that can make you trip. What can I do in the kitchen?  Clean up any spills right away.  Avoid walking on wet floors.  Keep items that you use a lot in easy-to-reach places.  If you need to reach something above you, use a strong step stool that has a grab bar.  Keep electrical cords out of the way.  Do not use floor polish or wax that makes floors slippery. If you must  use wax, use non-skid floor wax.  Do not have throw rugs and other things on the floor that can make you trip. What can I do with my stairs?  Do not leave any items on the stairs.  Make sure that there are handrails on both sides of the stairs and use them. Fix handrails that are broken or loose. Make sure that handrails are as long as the stairways.  Check any carpeting to make sure that it is firmly attached to the stairs. Fix any carpet that is loose or worn.  Avoid having throw rugs at the top or bottom of the stairs. If you do have throw rugs, attach them to the floor with carpet tape.  Make sure that you have a light switch at the top of the stairs and the bottom of the stairs. If you do not have them, ask someone to add them for you. What else can I do to help prevent falls?  Wear shoes that:  Do not have high heels.  Have rubber bottoms.  Are comfortable and fit you well.  Are closed  at the toe. Do not wear sandals.  If you use a stepladder:  Make sure that it is fully opened. Do not climb a closed stepladder.  Make sure that both sides of the stepladder are locked into place.  Ask someone to hold it for you, if possible.  Clearly mark and make sure that you can see:  Any grab bars or handrails.  First and last steps.  Where the edge of each step is.  Use tools that help you move around (mobility aids) if they are needed. These include:  Canes.  Walkers.  Scooters.  Crutches.  Turn on the lights when you go into a dark area. Replace any light bulbs as soon as they burn out.  Set up your furniture so you have a clear path. Avoid moving your furniture around.  If any of your floors are uneven, fix them.  If there are any pets around you, be aware of where they are.  Review your medicines with your doctor. Some medicines can make you feel dizzy. This can increase your chance of falling. Ask your doctor what other things that you can do to help prevent falls. This information is not intended to replace advice given to you by your health care provider. Make sure you discuss any questions you have with your health care provider. Document Released: 10/31/2008 Document Revised: 06/12/2015 Document Reviewed: 02/08/2014 Elsevier Interactive Patient Education  2017 [employer].

## 2017-11-17 ENCOUNTER — Ambulatory Visit (INDEPENDENT_AMBULATORY_CARE_PROVIDER_SITE_OTHER): Payer: Medicare Other | Admitting: Nurse Practitioner

## 2017-11-17 ENCOUNTER — Other Ambulatory Visit: Payer: Self-pay

## 2017-11-17 ENCOUNTER — Encounter: Payer: Self-pay | Admitting: Nurse Practitioner

## 2017-11-17 VITALS — BP 150/76 | HR 60 | Temp 97.8°F | Ht <= 58 in | Wt 100.6 lb

## 2017-11-17 DIAGNOSIS — I10 Essential (primary) hypertension: Secondary | ICD-10-CM | POA: Diagnosis not present

## 2017-11-17 DIAGNOSIS — M25551 Pain in right hip: Secondary | ICD-10-CM | POA: Diagnosis not present

## 2017-11-17 MED ORDER — LOSARTAN POTASSIUM 50 MG PO TABS: 50.0000 mg | ORAL_TABLET | Freq: Every day | ORAL | 1 refills | Status: DC

## 2017-11-17 NOTE — Patient Instructions (Addendum)
Madeline Taylor,   Thank you for coming in to clinic today.  1. You are at risk of stroke with high blood pressure. - START losartan 50 mg once daily.  This is a medicine you have tolerated before.  If you have new side effects, please call clinic before stopping the medication.  You are most likely to feel tired over the next 2-4 weeks as your body adjusts to a lower blood pressure.   2. If you have worse hip pain on some days, TAKE Tylenol Extra strength 500 - 1000 mg up to 3 times a day. Take only once if needed, but you can safely repeat the dose if you are still in pain. Take this AS NEEDED.  Please schedule a follow-up appointment with Madeline Taylor, AGNP. Return in about 3 months (around 02/17/2018) for hypertension.  If you have any other questions or concerns, please feel free to call the clinic or send a message through MyChart. You may also schedule an earlier appointment if necessary.  You will receive a survey after today's visit either digitally by e-mail or paper by [employer]. Your experiences and feedback matter to [place].  Please respond so we know how we are doing as we provide care for you.   Madeline Mcardle, DNP, AGNP-BC Adult Gerontology Nurse Practitioner [facility], [facility]   Hypertension Hypertension, commonly called high blood pressure, is when the force of blood pumping through the arteries is too strong. The arteries are the blood vessels that carry blood from the heart throughout the body. Hypertension forces the heart to work harder to pump blood and may cause arteries to become narrow or stiff. Having untreated or uncontrolled hypertension can cause heart attacks, strokes, kidney disease, and other problems. A blood pressure reading consists of a higher number over a lower number. Ideally, your blood pressure should be below 120/80. The first ("top") number is called the systolic pressure. It is a measure of the pressure in your arteries as your heart  beats. The second ("bottom") number is called the diastolic pressure. It is a measure of the pressure in your arteries as the heart relaxes. What are the causes? The cause of this condition is not known. What increases the risk? Some risk factors for high blood pressure are under your control. Others are not. Factors you can change  Smoking.  Having type 2 diabetes mellitus, high cholesterol, or both.  Not getting enough exercise or physical activity.  Being overweight.  Having too much fat, sugar, calories, or salt (sodium) in your diet.  Drinking too much alcohol. Factors that are difficult or impossible to change  Having chronic kidney disease.  Having a family history of high blood pressure.  Age. Risk increases with age.  Race. You may be at higher risk if you are African-American.  Gender. Men are at higher risk than women before age 31. After age 73, women are at higher risk than men.  Having obstructive sleep apnea.  Stress. What are the signs or symptoms? Extremely high blood pressure (hypertensive crisis) may cause:  Headache.  Anxiety.  Shortness of breath.  Nosebleed.  Nausea and vomiting.  Severe chest pain.  Jerky movements you cannot control (seizures).  How is this diagnosed? This condition is diagnosed by measuring your blood pressure while you are seated, with your arm resting on a surface. The cuff of the blood pressure monitor will be placed directly against the skin of your upper arm at the level of  your heart. It should be measured at least twice using the same arm. Certain conditions can cause a difference in blood pressure between your right and left arms. Certain factors can cause blood pressure readings to be lower or higher than normal (elevated) for a short period of time:  When your blood pressure is higher when you are in a health care provider's office than when you are at home, this is called white coat hypertension. Most people  with this condition do not need medicines.  When your blood pressure is higher at home than when you are in a health care provider's office, this is called masked hypertension. Most people with this condition may need medicines to control blood pressure.  If you have a high blood pressure reading during one visit or you have normal blood pressure with other risk factors:  You may be asked to return on a different day to have your blood pressure checked again.  You may be asked to monitor your blood pressure at home for 1 week or longer.  If you are diagnosed with hypertension, you may have other blood or imaging tests to help your health care provider understand your overall risk for other conditions. How is this treated? This condition is treated by making healthy lifestyle changes, such as eating healthy foods, exercising more, and reducing your alcohol intake. Your health care provider may prescribe medicine if lifestyle changes are not enough to get your blood pressure under control, and if:  Your systolic blood pressure is above 130.  Your diastolic blood pressure is above 80.  Your personal target blood pressure may vary depending on your medical conditions, your age, and other factors. Follow these instructions at home: Eating and drinking  Eat a diet that is high in fiber and potassium, and low in sodium, added sugar, and fat. An example eating plan is called the DASH (Dietary Approaches to Stop Hypertension) diet. To eat this way: ? Eat plenty of fresh fruits and vegetables. Try to fill half of your plate at each meal with fruits and vegetables. ? Eat whole grains, such as whole wheat pasta, brown rice, or whole grain bread. Fill about one quarter of your plate with whole grains. ? Eat or drink low-fat dairy products, such as skim milk or low-fat yogurt. ? Avoid fatty cuts of meat, processed or cured meats, and poultry with skin. Fill about one quarter of your plate with lean  proteins, such as fish, chicken without skin, beans, eggs, and tofu. ? Avoid premade and processed foods. These tend to be higher in sodium, added sugar, and fat.  Reduce your daily sodium intake. Most people with hypertension should eat less than 1,500 mg of sodium a day.  Limit alcohol intake to no more than 1 drink a day for nonpregnant women and 2 drinks a day for men. One drink equals 12 oz of beer, 5 oz of wine, or 1 oz of hard liquor. Lifestyle  Work with your health care provider to maintain a healthy body weight or to lose weight. Ask what an ideal weight is for you.  Get at least 30 minutes of exercise that causes your heart to beat faster (aerobic exercise) most days of the week. Activities may include walking, swimming, or biking.  Include exercise to strengthen your muscles (resistance exercise), such as pilates or lifting weights, as part of your weekly exercise routine. Try to do these types of exercises for 30 minutes at least 3 days a week.  Do not use any products that contain nicotine or tobacco, such as cigarettes and e-cigarettes. If you need help quitting, ask your health care provider.  Monitor your blood pressure at home as told by your health care provider.  Keep all follow-up visits as told by your health care provider. This is important. Medicines  Take over-the-counter and prescription medicines only as told by your health care provider. Follow directions carefully. Blood pressure medicines must be taken as prescribed.  Do not skip doses of blood pressure medicine. Doing this puts you at risk for problems and can make the medicine less effective.  Ask your health care provider about side effects or reactions to medicines that you should watch for. Contact a health care provider if:  You think you are having a reaction to a medicine you are taking.  You have headaches that keep coming back (recurring).  You feel dizzy.  You have swelling in your  ankles.  You have trouble with your vision. Get help right away if:  You develop a severe headache or confusion.  You have unusual weakness or numbness.  You feel faint.  You have severe pain in your chest or abdomen.  You vomit repeatedly.  You have trouble breathing. Summary  Hypertension is when the force of blood pumping through your arteries is too strong. If this condition is not controlled, it may put you at risk for serious complications.  Your personal target blood pressure may vary depending on your medical conditions, your age, and other factors. For most people, a normal blood pressure is less than 120/80.  Hypertension is treated with lifestyle changes, medicines, or a combination of both. Lifestyle changes include weight loss, eating a healthy, low-sodium diet, exercising more, and limiting alcohol. This information is not intended to replace advice given to you by your health care provider. Make sure you discuss any questions you have with your health care provider. Document Released: 01/04/2005 Document Revised: 12/03/2015 Document Reviewed: 12/03/2015 Elsevier Interactive Patient Education  [employer].

## 2017-11-17 NOTE — Progress Notes (Signed)
Subjective:    Patient ID: Madeline Taylor, female    DOB: [birth year], 82 y.o.   MRN: [id number]  Madeline Taylor is a 82 y.o. female presenting on 11/17/2017 for Hypertension (pt dicontinued all medication x )   HPI Hypertension - She is checking BP at home or outside of clinic.  Readings 145-155/70-80s - Current medications: none,  - Patient stopped all medications and now "feels better."  Leg cramps were her biggest complaint on meds.  Likely ezetimibe. - She is not currently symptomatic other than She has occasional dizzy spells.  - Pt denies headache, changes in vision, chest tightness/pressure, palpitations, leg swelling, sudden loss of speech or loss of consciousness. 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   RIGHT hip pain Causes pain regularly, but is making her unsteady on her feet.  She no longer has sharp pain that she described 1 year ago.  States that it has improved overall, but is most concerned about her unsteadiness.  No reports of fall while walking.  Did fall out of bed this week without injury.  Past Medical History:  Diagnosis Date  . Essential hypertension 07/29/2016  . History of vertigo 07/29/2016  . Hyperlipidemia 07/29/2016  . Osteoarthritis of back 07/29/2016  . Osteopenia of multiple sites 07/29/2016  . Seasonal allergies 07/29/2016  . Urinary, incontinence, stress female 07/29/2016   Past Surgical History:  Procedure Laterality Date  . no past surgery     Social History   Socioeconomic History  . Marital status: Married    Spouse name: Not on file  . Number of children: Not on file  . Years of education: Not on file  . Highest education level: 12th grade  Occupational History  . Not on file  Social Needs  . Financial resource strain: Not hard at all  . Food insecurity:    Worry:  Never true    Inability: Never true  . Transportation needs:    Medical: No    Non-medical: No  Tobacco Use  . Smoking status: Never Smoker  . Smokeless tobacco: Never Used  Substance and Sexual Activity  . Alcohol use: No  . Drug use: No  . Sexual activity: Not Currently    Birth control/protection: None, Post-menopausal  Lifestyle  . Physical activity:    Days per week: 0 days    Minutes per session: 0 min  . Stress: Not at all  Relationships  . Social connections:    Talks on phone: More than three times a week    Gets together: More than three times a week    Attends religious service: More than 4 times per year    Active member of club or organization: No    Attends meetings of clubs or organizations: Never    Relationship status: Married  . Intimate partner violence:    Fear of current or ex partner: No    Emotionally abused: No    Physically abused: No    Forced sexual activity: No  Other Topics Concern  . Not on file  Social History Narrative  . Not on file   Family History  Problem Relation Age of Onset  . Breast cancer Sister   . Cancer Sister 57       breast cancer  . Alzheimer's disease  Mother   . Healthy Father    Current Outpatient Medications on File Prior to Visit  Medication Sig  . aspirin 325 MG tablet Take 325 mg by mouth daily.  . Calcium Carb-Cholecalciferol (CALCIUM-VITAMIN D) 500-200 MG-UNIT tablet Take 1 tablet by mouth daily.  . cetirizine (ZYRTEC) 10 MG tablet Take 10 mg by mouth daily.  Marland Kitchen ezetimibe (ZETIA) 10 MG tablet Take 1 tablet (10 mg total) by mouth daily. (Patient not taking: Reported on 12/06/2016)  . losartan (COZAAR) 50 MG tablet Take 1 tablet (50 mg total) by mouth daily. (Patient not taking: Reported on 11/08/2017)   No current facility-administered medications on file prior to visit.     Review of Systems Per HPI unless specifically indicated above     Objective:    BP (!) 150/76 (BP Location: Left Arm, Patient  Position: Sitting, Cuff Size: Normal) Comment (Cuff Size): manual  Pulse 60   Temp 97.8 F (36.6 C) (Oral)   Ht 4\' 9"  (1.448 m)   Wt 100 lb 9.6 oz (45.6 kg)   BMI 21.77 kg/m   Wt Readings from Last 3 Encounters:  11/17/17 100 lb 9.6 oz (45.6 kg)  11/08/17 104 lb 3.2 oz (47.3 kg)  12/06/16 116 lb 9.6 oz (52.9 kg)    Physical Exam  Constitutional: She is oriented to person, place, and time. She appears well-developed and well-nourished. No distress.  HENT:  Head: Normocephalic and atraumatic.  Neck: Normal range of motion. Neck supple. Carotid bruit is not present.  Cardiovascular: Normal rate, regular rhythm, S1 normal, S2 normal, normal heart sounds and intact distal pulses.  Pulmonary/Chest: Effort normal and breath sounds normal. No respiratory distress.  Musculoskeletal: She exhibits no edema (pedal).       Right hip: She exhibits decreased range of motion (Pt w decreased abduction/adduction.  Normal flex/extension.). She exhibits normal strength, no tenderness, no bony tenderness, no swelling, no crepitus, no deformity and no laceration.  Neurological: She is alert and oriented to person, place, and time.  Skin: Skin is warm and dry. Capillary refill takes less than 2 seconds.  Psychiatric: She has a normal mood and affect. Her behavior is normal. Judgment and thought content normal.  Vitals reviewed.   Results for orders placed or performed in visit on 10/12/16  POCT Urinalysis Dipstick  Result Value Ref Range   Color, UA yellow    Clarity, UA clear    Glucose, UA neg    Bilirubin, UA neg    Ketones, UA neg    Spec Grav, UA 1.015 1.010 - 1.025   Blood, UA neg    pH, UA 5.0 5.0 - 8.0   Protein, UA neg    Urobilinogen, UA 0.2 0.2 or 1.0 E.U./dL   Nitrite, UA neg    Leukocytes, UA Negative Negative      Assessment & Plan:   Problem List Items Addressed This Visit      Cardiovascular and Mediastinum   Essential hypertension - Primary Uncontrolled hypertension.  BP  goal < 130/80.  Pt is not working on lifestyle modifications.  Taking medications tolerating well without side effects. No currently identified complications.  Concern for CVA risk and kidney insufficiency in future.  Plan: 1. Resume taking losartan. 2. Obtain labs today for CMP  3. Encouraged heart healthy diet and increasing exercise to 30 minutes most days of the week. 4. Check BP 1-2 x per week at home, keep log, and bring to clinic at next appointment. 5. Reviewed with  patient need to control BP to reduce hemorrhagic stroke risk.  Patient with advanced age, is still middle old by physical presentation.  Reduce risk of CVA to continue good quality of life.  Patient verbalizes understanding. 6. Follow up 3 months.     Relevant Medications   losartan (COZAAR) 50 MG tablet   Other Relevant Orders   COMPLETE METABOLIC PANEL WITH GFR    Other Visit Diagnoses    Right hip pain     Pain likely chronic and related to OA.  Hip instability concerning for possible joint deterioration, weakness.  No falls while walking/standing.  Plan:  1. Treat with OTC pain meds (acetaminophen).  Discussed prn dosing. 2. Apply heat and/or ice to affected area. 3. May also apply a muscle rub with lidocaine or lidocaine patch after heat or ice. 4. Offered Xray and physical therapy, which patient declined today. 5. Follow up prn.       Meds ordered this encounter  Medications  . losartan (COZAAR) 50 MG tablet    Sig: Take 1 tablet (50 mg total) by mouth daily.    Dispense:  90 tablet    Refill:  1    Order Specific Question:   Supervising Provider    Answer:   Smitty Cords [[id number]]    Follow up plan: Return in about 3 months (around 02/17/2018) for hypertension.  [name], DNP, AGPCNP-BC Adult Gerontology Primary Care Nurse Practitioner [facility] 11/17/2017, 8:40 AM

## 2017-11-18 ENCOUNTER — Encounter: Payer: Medicare Other | Admitting: Nurse Practitioner

## 2017-11-18 LAB — COMPLETE METABOLIC PANEL WITH GFR
AG Ratio: 1.4 (calc) (ref 1.0–2.5)
ALT: 9 U/L (ref 6–29)
AST: 16 U/L (ref 10–35)
Albumin: 4.2 g/dL (ref 3.6–5.1)
Alkaline phosphatase (APISO): 62 U/L (ref 33–130)
BUN: 14 mg/dL (ref 7–25)
CO2: 29 mmol/L (ref 20–32)
Calcium: 9.4 mg/dL (ref 8.6–10.4)
Chloride: 103 mmol/L (ref 98–110)
Creat: 0.82 mg/dL (ref 0.60–0.88)
GFR, Est African American: 78 mL/min/{1.73_m2} (ref >=60)
GFR, Est Non African American: 68 mL/min/{1.73_m2} (ref >=60)
Globulin: 3 g/dL (calc) (ref 1.9–3.7)
Glucose, Bld: 86 mg/dL (ref 65–99)
Potassium: 4.1 mmol/L (ref 3.5–5.3)
Sodium: 140 mmol/L (ref 135–146)
Total Bilirubin: 0.7 mg/dL (ref 0.2–1.2)
Total Protein: 7.2 g/dL (ref 6.1–8.1)

## 2018-02-17 ENCOUNTER — Encounter: Payer: Self-pay | Admitting: Nurse Practitioner

## 2018-02-17 ENCOUNTER — Ambulatory Visit (INDEPENDENT_AMBULATORY_CARE_PROVIDER_SITE_OTHER): Payer: Medicare Other | Admitting: Nurse Practitioner

## 2018-02-17 ENCOUNTER — Other Ambulatory Visit: Payer: Self-pay

## 2018-02-17 VITALS — BP 156/65 | HR 60 | Ht <= 58 in | Wt 103.2 lb

## 2018-02-17 DIAGNOSIS — I10 Essential (primary) hypertension: Secondary | ICD-10-CM | POA: Diagnosis not present

## 2018-02-17 LAB — BASIC METABOLIC PANEL WITH GFR
BUN: 15 mg/dL (ref 7–25)
CO2: 29 mmol/L (ref 20–32)
Calcium: 9.1 mg/dL (ref 8.6–10.4)
Chloride: 104 mmol/L (ref 98–110)
Creat: 0.82 mg/dL (ref 0.60–0.88)
GFR, Est African American: 78 mL/min/{1.73_m2} (ref >=60)
GFR, Est Non African American: 68 mL/min/{1.73_m2} (ref >=60)
Glucose, Bld: 85 mg/dL (ref 65–99)
Potassium: 3.8 mmol/L (ref 3.5–5.3)
Sodium: 141 mmol/L (ref 135–146)

## 2018-02-17 MED ORDER — IRBESARTAN 300 MG PO TABS: 300.0000 mg | ORAL_TABLET | Freq: Every day | ORAL | 1 refills | Status: DC

## 2018-02-17 NOTE — Progress Notes (Signed)
Subjective:    Patient ID: Madeline Taylor, female    DOB: 08/17/1935, 83 y.o.   MRN: [id number]  Madeline Taylor is a 83 y.o. female presenting on 02/17/2018 for Hypertension   HPI Hypertension Patient has previously been very resistant to regularly taking medicines, but over last 3 months she has taken her losartan nearly daily.  Reports side effect of itching, but states she is willing to continue taking it because she feels better.  On days she forgets her medicine, she does not itch.  This is only missed about 1-2 days per month per patient report. - She is not checking BP at home or outside of clinic.    - Current medications: losartan 50 mg once daily - She is not currently symptomatic. - Pt denies headache, lightheadedness, dizziness, changes in vision, chest tightness/pressure, palpitations, leg swelling, sudden loss of speech or loss of consciousness. - She  reports an exercise routine that includes housework, 5-6 days per week. - Her diet is moderate in salt, moderate in fat, and moderate in carbohydrates.   Social History   Tobacco Use  . Smoking status: Never Smoker  . Smokeless tobacco: Never Used  Substance Use Topics  . Alcohol use: No  . Drug use: No    Review of Systems Per HPI unless specifically indicated above     Objective:    BP (!) 156/65 (BP Location: Right Arm, Patient Position: Sitting, Cuff Size: Small)   Pulse 60   Ht 4\' 9"  (1.448 m)   Wt 103 lb 3.2 oz (46.8 kg)   BMI 22.33 kg/m   Wt Readings from Last 3 Encounters:  02/17/18 103 lb 3.2 oz (46.8 kg)  11/17/17 100 lb 9.6 oz (45.6 kg)  11/08/17 104 lb 3.2 oz (47.3 kg)    Physical Exam Vitals signs reviewed.  Constitutional:      General: She is not in acute distress.    Appearance: She is well-developed.  HENT:     Head: Normocephalic and atraumatic.  Cardiovascular:     Rate and Rhythm: Normal rate and regular rhythm.     Pulses:          Radial pulses are 2+ on the right side and 2+ on  the left side.       Posterior tibial pulses are 1+ on the right side and 1+ on the left side.     Heart sounds: Normal heart sounds, S1 normal and S2 normal.  Pulmonary:     Effort: Pulmonary effort is normal. No respiratory distress.     Breath sounds: Normal breath sounds and air entry.  Musculoskeletal:     Right lower leg: No edema.     Left lower leg: No edema.  Skin:    General: Skin is warm and dry.     Capillary Refill: Capillary refill takes less than 2 seconds.  Neurological:     Mental Status: She is alert and oriented to person, place, and time. Mental status is at baseline.     Motor: Weakness (4/5 Right leg due to hip pain and arthritis) present.  Psychiatric:        Attention and Perception: Attention normal.        Mood and Affect: Mood and affect normal.        Behavior: Behavior normal. Behavior is cooperative.      Results for orders placed or performed in visit on 11/17/17  COMPLETE METABOLIC PANEL WITH GFR  Result Value Ref  Range   Glucose, Bld 86 65 - 99 mg/dL   BUN 14 7 - 25 mg/dL   Creat 1.610.82 0.960.60 - 0.450.88 mg/dL   GFR, Est Non African American 68 > OR = 60 mL/min/1.7473m2   GFR, Est African American 78 > OR = 60 mL/min/1.7773m2   BUN/Creatinine Ratio NOT APPLICABLE 6 - 22 (calc)   Sodium 140 135 - 146 mmol/L   Potassium 4.1 3.5 - 5.3 mmol/L   Chloride 103 98 - 110 mmol/L   CO2 29 20 - 32 mmol/L   Calcium 9.4 8.6 - 10.4 mg/dL   Total Protein 7.2 6.1 - 8.1 g/dL   Albumin 4.2 3.6 - 5.1 g/dL   Globulin 3.0 1.9 - 3.7 g/dL (calc)   AG Ratio 1.4 1.0 - 2.5 (calc)   Total Bilirubin 0.7 0.2 - 1.2 mg/dL   Alkaline phosphatase (APISO) 62 33 - 130 U/L   AST 16 10 - 35 U/L   ALT 9 6 - 29 U/L      Assessment & Plan:   Problem List Items Addressed This Visit      Cardiovascular and Mediastinum   Essential hypertension - Primary   Relevant Medications   aspirin 81 MG tablet   irbesartan (AVAPRO) 300 MG tablet   Other Relevant Orders   BASIC METABOLIC PANEL  WITH GFR    Uncontrolled hypertension, but is improving.  BP goal < 140/90 due to frail elder.  Pt is working on lifestyle modifications.  Taking medications tolerating well, but with mild side effect of itching.  This is occurring in about 2% of population per [employer].  No known complications.  Plan: 1. STOP losartan - higher dose could worsen itching and cause patient to stop med. - START irbesartan at 300 mg once daily.  Equivalent to increased dose losartan to bring patient closer to BP goal. 2. Obtain labs BMP today assess kidney function after regular ARB use.  3. Encouraged heart healthy diet and increasing exercise to 30 minutes most days of the week. 4. Check BP 1-2 x per week at home, keep log, and bring to clinic at next appointment. 5. Follow up 3 months.     Meds ordered this encounter  Medications  . irbesartan (AVAPRO) 300 MG tablet    Sig: Take 1 tablet (300 mg total) by mouth daily.    Dispense:  90 tablet    Refill:  1    Order Specific Question:   Supervising Provider    Answer:   Smitty CordsKARAMALEGOS, ALEXANDER J [[id number]]    Follow up plan: Return in about 3 months (around 05/18/2018) for hypertension.  Madeline McardleLauren Anihya Tuma, DNP, AGPCNP-BC Adult Gerontology Primary Care Nurse Practitioner [facility] 02/17/2018, 8:12 AM

## 2018-02-17 NOTE — Patient Instructions (Addendum)
Sherilyn Cooter,   Thank you for coming in to clinic today.  1. STOP losartan.  2. START irbesartan 300 mg once daily.  This one should not have itching.  Please schedule a follow-up appointment with [name], AGNP. Return in about 3 months (around 05/18/2018) for hypertension.  If you have any other questions or concerns, please feel free to call the clinic or send a message through MyChart. You may also schedule an earlier appointment if necessary.  You will receive a survey after today's visit either digitally by e-mail or paper by [employer]. Your experiences and feedback matter to [place].  Please respond so we know how we are doing as we provide care for you.   [name], DNP, AGNP-BC Adult Gerontology Nurse Practitioner [facility], [facility]

## 2018-05-18 ENCOUNTER — Ambulatory Visit (INDEPENDENT_AMBULATORY_CARE_PROVIDER_SITE_OTHER): Payer: Medicare Other | Admitting: Family Medicine

## 2018-05-18 ENCOUNTER — Other Ambulatory Visit: Payer: Self-pay

## 2018-05-18 ENCOUNTER — Encounter: Payer: Self-pay | Admitting: Family Medicine

## 2018-05-18 ENCOUNTER — Ambulatory Visit: Payer: Medicare Other | Admitting: Nurse Practitioner

## 2018-05-18 VITALS — BP 166/77

## 2018-05-18 DIAGNOSIS — M150 Primary generalized (osteo)arthritis: Secondary | ICD-10-CM | POA: Diagnosis not present

## 2018-05-18 DIAGNOSIS — I10 Essential (primary) hypertension: Secondary | ICD-10-CM

## 2018-05-18 DIAGNOSIS — M159 Polyosteoarthritis, unspecified: Secondary | ICD-10-CM | POA: Insufficient documentation

## 2018-05-18 NOTE — Assessment & Plan Note (Addendum)
Stable chronic problem with L Hip pain with underlying osteoarthritis most likely without new injury Also L shoulder problem Previously followed by Orthopedics, Dr Martha Clan, prior injections years ago - No recent imaging on file - No radiation of pain or radicular symptoms - Inadequate conservative therapy   Plan: 1. Discuss general conservative treatment recommendations for arthritis 2. May use Tylenol PRN for breakthrough - emphasized dosing Ext Str 500mg  1-2 pills 1-3 times a day 3. Encouraged use of heating pad 1-2x daily for now then PRN.  Follow-up in future if not improving, consider hip x-rays, future troch bursa steroid injection, may follow-up with already established Ortho if needed  Also L shoulder - could have episode of bursitis in past - may warrant injection in shoulder in future

## 2018-05-18 NOTE — Patient Instructions (Addendum)
AVS Given over phone. See Note. No Mychart access

## 2018-05-18 NOTE — Assessment & Plan Note (Addendum)
Seems to be controlled HTN, limited readings at home No known complications  Off Losartan    Plan:  1. Continue current BP regimen - Irbesartan 300mg  daily - tolerating well, no report of itching or side effect 2. Encourage improved lifestyle - low sodium diet, regular exercise 3. Start monitor BP outside office, bring readings to next visit, if persistently >140/90 or new symptoms notify office sooner 4. Follow-up 6 months

## 2018-05-18 NOTE — Progress Notes (Signed)
Virtual Visit via Telephone The purpose of this virtual visit is to provide medical care while limiting exposure to the novel coronavirus (COVID19) for both patient and office staff.  Consent was obtained for phone visit:  Yes.   Answered questions that patient had about telehealth interaction:  Yes.   I discussed the limitations, risks, security and privacy concerns of performing an evaluation and management service by telephone. I also discussed with the patient that there may be a patient responsible charge related to this service. The patient expressed understanding and agreed to proceed.  Patient Location: Home Provider Location: [facility] (Office)  PCP is Wilhelmina McardleLauren Kennedy, AGPCNP-BC - I am currently covering during her maternity leave.   ---------------------------------------------------------------------- Chief Complaint  Patient presents with  . Hypertension  . Joint Pain    constant pain. mostly hip joint x 5-5625yrs, but now the pain seems to worsen.     S: Reviewed CMA documentation. I have called patient and gathered additional HPI as follows:  CHRONIC HTN: Reports doing well with regards to BP. Not checking regularly. Previously on Losartan 50mg  daily, back in 01/2018 last visit she was switched from Losartan over to Irbesartan 300mg , had some itching on losartan Current Meds - Irbesartan 300mg  daily   Reports good compliance, took meds today. Tolerating well, w/o complaints. Lifestyle: - Diet: improving - Exercise: staying active at home Denies CP, dyspnea, HA, edema, dizziness / lightheadedness  Osteoarthritis Multiple, joints - Left Hip and Left Shoulder Reports background history of several joint aches and pains, episodic flare up, previously followed by Dr [facility], she had previous injection in past, some improvement. She used to take aspirin regularly. - Currently not taking any NSAID or Tylenol. - Takes low dose ASA still -  Admits episode of leg muscle cramps, not trying any medicine or home remedy Denies new fall or injury or joint swelling or redness, fever chills  Patient is currently at home in isolation Denies any high risk travel to areas of current concern for COVID19. Denies any known or suspected exposure to person with or possibly with COVID19.  Denies any fevers, chills, sweats, body ache, cough, shortness of breath, sinus pain or pressure, headache, abdominal pain, diarrhea  Past Medical History:  Diagnosis Date  . Essential hypertension 07/29/2016  . History of vertigo 07/29/2016  . Hyperlipidemia 07/29/2016  . Osteoarthritis of back 07/29/2016  . Osteopenia of multiple sites 07/29/2016  . Seasonal allergies 07/29/2016  . Urinary, incontinence, stress female 07/29/2016   Social History   Tobacco Use  . Smoking status: Never Smoker  . Smokeless tobacco: Never Used  Substance Use Topics  . Alcohol use: No  . Drug use: No    Current Outpatient Medications:  .  aspirin 81 MG tablet, Take 81 mg by mouth daily., Disp: , Rfl:  .  irbesartan (AVAPRO) 300 MG tablet, Take 1 tablet (300 mg total) by mouth daily., Disp: 90 tablet, Rfl: 1  [facility] 11/08/2017 11/04/2016 07/29/2016  Decreased Interest 0 0 0  Down, Depressed, Hopeless 0 0 0  PHQ - 2 Score 0 0 0  Altered sleeping - - 0  Tired, decreased energy - - 0  Change in appetite - - 0  Feeling bad or failure about yourself  - - 0  Trouble concentrating - - 0  Moving slowly or fidgety/restless - - 0  Suicidal thoughts - - 0  PHQ-9 Score - - 0    No flowsheet data  found.  -------------------------------------------------------------------------- O: No physical exam performed due to remote telephone encounter.  Lab results reviewed.  No results found for this or any previous visit (from the past 2160 hour(s)).  -------------------------------------------------------------------------- A&P:  Problem List Items  Addressed This Visit    Essential hypertension - Primary    Seems to be controlled HTN, limited readings at home No known complications  Off Losartan    Plan:  1. Continue current BP regimen - Irbesartan 300mg  daily - tolerating well, no report of itching or side effect 2. Encourage improved lifestyle - low sodium diet, regular exercise 3. Start monitor BP outside office, bring readings to next visit, if persistently >140/90 or new symptoms notify office sooner 4. Follow-up 6 months       Osteoarthritis of multiple joints    Stable chronic problem with L Hip pain with underlying osteoarthritis most likely without new injury Also L shoulder problem Previously followed by Orthopedics, Dr Martha Clan, prior injections years ago - No recent imaging on file - No radiation of pain or radicular symptoms - Inadequate conservative therapy   Plan: 1. Discuss general conservative treatment recommendations for arthritis 2. May use Tylenol PRN for breakthrough - emphasized dosing Ext Str 500mg  1-2 pills 1-3 times a day 3. Encouraged use of heating pad 1-2x daily for now then PRN.  Follow-up in future if not improving, consider hip x-rays, future troch bursa steroid injection, may follow-up with already established Ortho if needed  Also L shoulder - could have episode of bursitis in past - may warrant injection in shoulder in future          No orders of the defined types were placed in this encounter.   Follow-up: - Return in 6 months for Yearly Medicare Checkup Labs to be ordered at that time  Patient verbalizes understanding with the above medical recommendations including the limitation of remote medical advice.  Specific follow-up and call-back criteria were given for patient to follow-up or seek medical care more urgently if needed.   - Time spent in direct consultation with patient on phone: 11 minutes   Saralyn Pilar, DO [facility] 05/18/2018, 8:41 AM

## 2018-06-10 ENCOUNTER — Encounter: Payer: Self-pay | Admitting: Emergency Medicine

## 2018-06-10 ENCOUNTER — Emergency Department
Admission: EM | Admit: 2018-06-10 | Discharge: 2018-06-10 | Disposition: A | Discharge status: Home / Self Care | Payer: Medicare Other | Attending: Emergency Medicine | Admitting: Emergency Medicine

## 2018-06-10 ENCOUNTER — Emergency Department: Payer: Medicare Other

## 2018-06-10 ENCOUNTER — Other Ambulatory Visit: Payer: Self-pay

## 2018-06-10 DIAGNOSIS — Z79899 Other long term (current) drug therapy: Secondary | ICD-10-CM | POA: Diagnosis not present

## 2018-06-10 DIAGNOSIS — R2241 Localized swelling, mass and lump, right lower limb: Secondary | ICD-10-CM | POA: Diagnosis not present

## 2018-06-10 DIAGNOSIS — Z7982 Long term (current) use of aspirin: Secondary | ICD-10-CM | POA: Insufficient documentation

## 2018-06-10 DIAGNOSIS — R252 Cramp and spasm: Secondary | ICD-10-CM | POA: Insufficient documentation

## 2018-06-10 DIAGNOSIS — I10 Essential (primary) hypertension: Secondary | ICD-10-CM | POA: Diagnosis not present

## 2018-06-10 DIAGNOSIS — M79604 Pain in right leg: Secondary | ICD-10-CM | POA: Insufficient documentation

## 2018-06-10 LAB — CBC WITH DIFFERENTIAL/PLATELET
Abs Immature Granulocytes: 0.03 10*3/uL (ref 0.00–0.07)
Basophils Absolute: 0 10*3/uL (ref 0.0–0.1)
Basophils Relative: 1 %
Eosinophils Absolute: 0.1 10*3/uL (ref 0.0–0.5)
Eosinophils Relative: 2 %
HCT: 44.3 % (ref 36.0–46.0)
Hemoglobin: 14.6 g/dL (ref 12.0–15.0)
Immature Granulocytes: 1 %
Lymphocytes Relative: 30 %
Lymphs Abs: 1.7 10*3/uL (ref 0.7–4.0)
MCH: 29.7 pg (ref 26.0–34.0)
MCHC: 33 g/dL (ref 30.0–36.0)
MCV: 90.2 fL (ref 80.0–100.0)
Monocytes Absolute: 0.3 10*3/uL (ref 0.1–1.0)
Monocytes Relative: 6 %
Neutro Abs: 3.5 10*3/uL (ref 1.7–7.7)
Neutrophils Relative %: 60 %
Platelets: 239 10*3/uL (ref 150–400)
RBC: 4.91 MIL/uL (ref 3.87–5.11)
RDW: 11.5 % (ref 11.5–15.5)
WBC: 5.7 10*3/uL (ref 4.0–10.5)
nRBC: 0 % (ref 0.0–0.2)

## 2018-06-10 LAB — COMPREHENSIVE METABOLIC PANEL
ALT: 11 U/L (ref 0–44)
AST: 17 U/L (ref 15–41)
Albumin: 4.3 g/dL (ref 3.5–5.0)
Alkaline Phosphatase: 76 U/L (ref 38–126)
Anion gap: 12 (ref 5–15)
BUN: 21 mg/dL (ref 8–23)
CO2: 24 mmol/L (ref 22–32)
Calcium: 9.1 mg/dL (ref 8.9–10.3)
Chloride: 103 mmol/L (ref 98–111)
Creatinine, Ser: 1.14 mg/dL — ABNORMAL HIGH (ref 0.44–1.00)
GFR calc Af Amer: 53 mL/min — ABNORMAL LOW (ref >60)
GFR calc non Af Amer: 45 mL/min — ABNORMAL LOW (ref >60)
Glucose, Bld: 87 mg/dL (ref 70–99)
Potassium: 4.1 mmol/L (ref 3.5–5.1)
Sodium: 139 mmol/L (ref 135–145)
Total Bilirubin: 0.6 mg/dL (ref 0.3–1.2)
Total Protein: 7.9 g/dL (ref 6.5–8.1)

## 2018-06-10 IMAGING — US RIGHT LOWER EXTREMITY VENOUS ULTRASOUND
1 series · 13 of 24 positions shown · non-contrast
Comparison: None.

CLINICAL DATA: 82-year-old female with right leg pain and swelling



[Series 1: right lower extremity venous ultrasound · 13 of 33 slices shown]
[im 1/33]
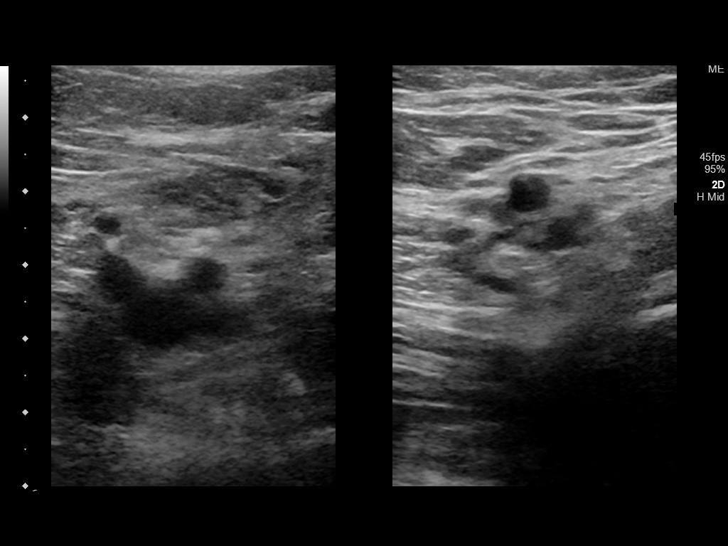
[im 3/33]
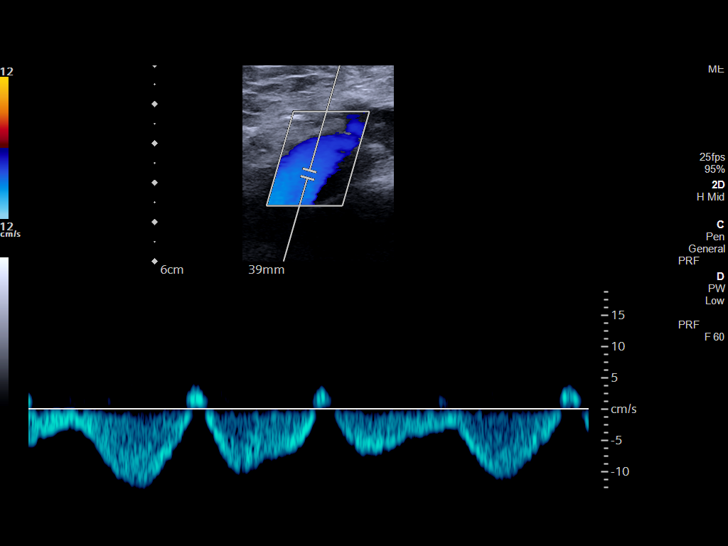
[im 6/33]
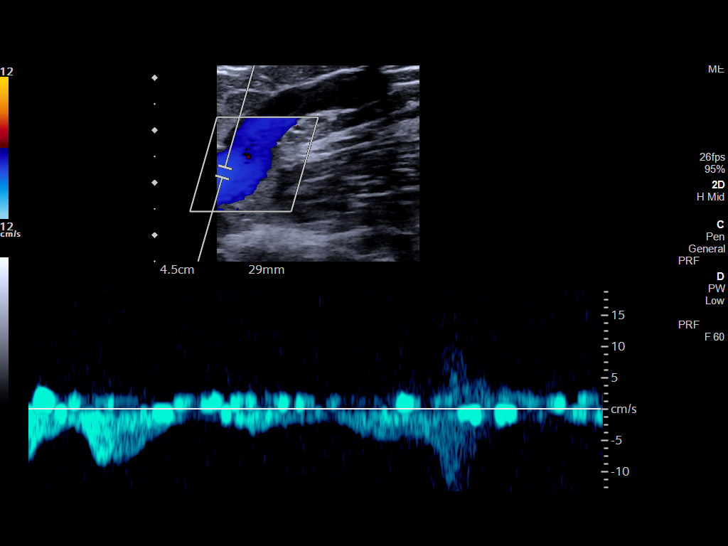
[im 9/33]
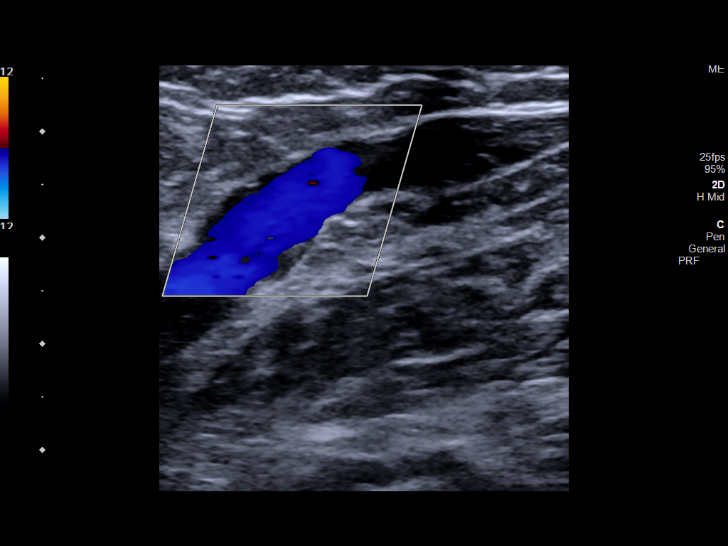
[im 12/33]
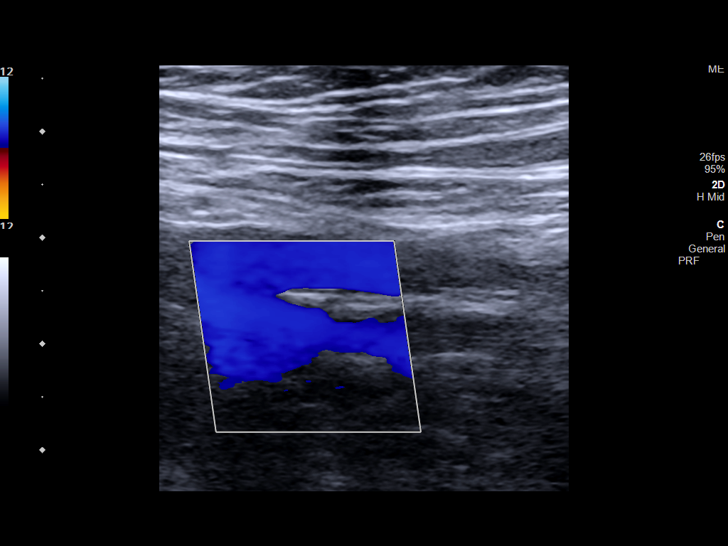
[im 14/33]
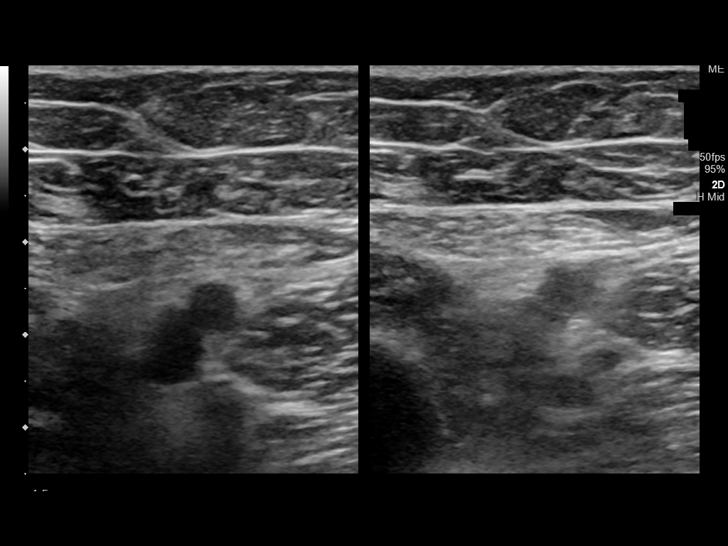
[im 17/33]
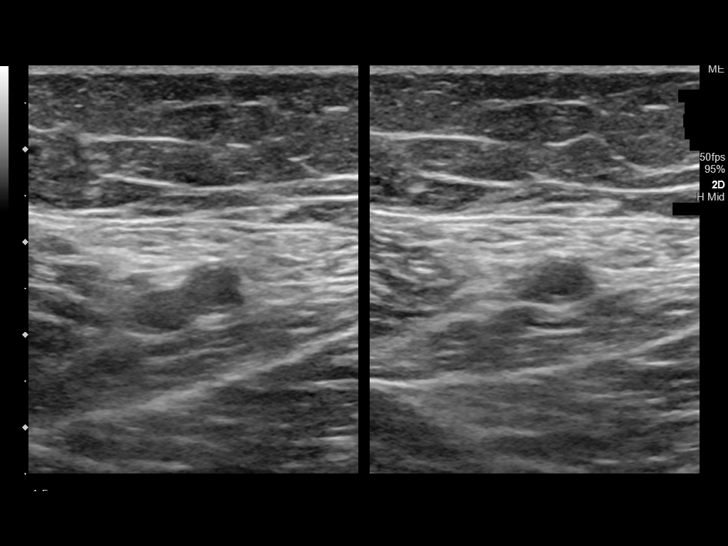
[im 19/33]
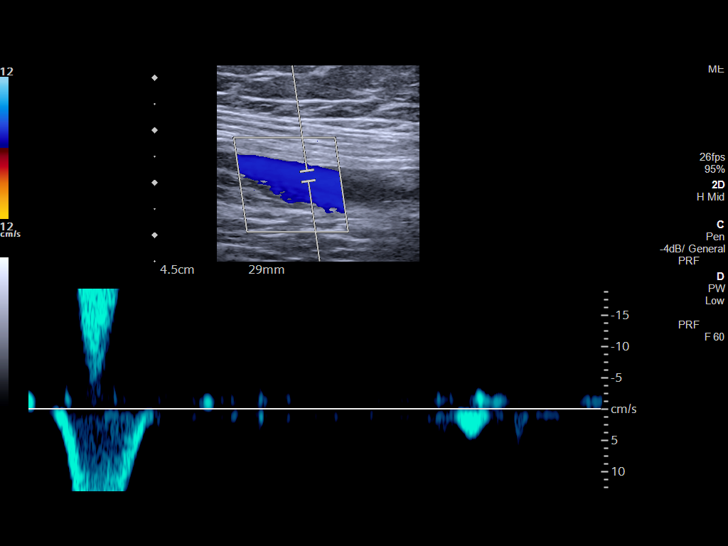
[im 21/33]
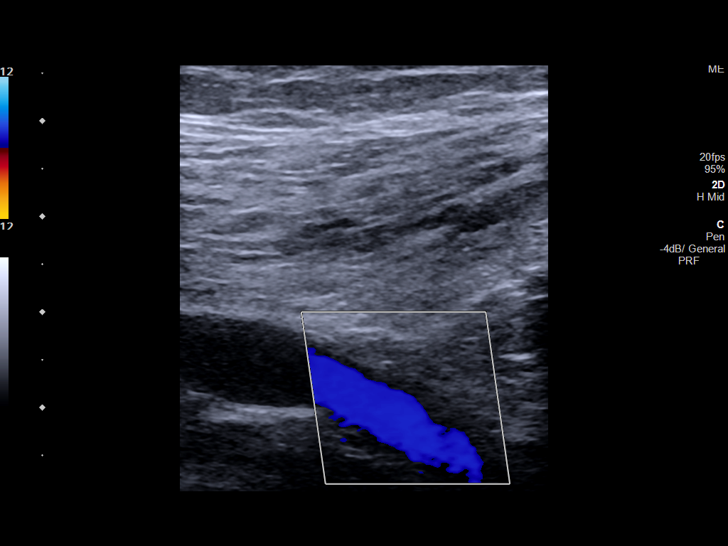
[im 24/33]
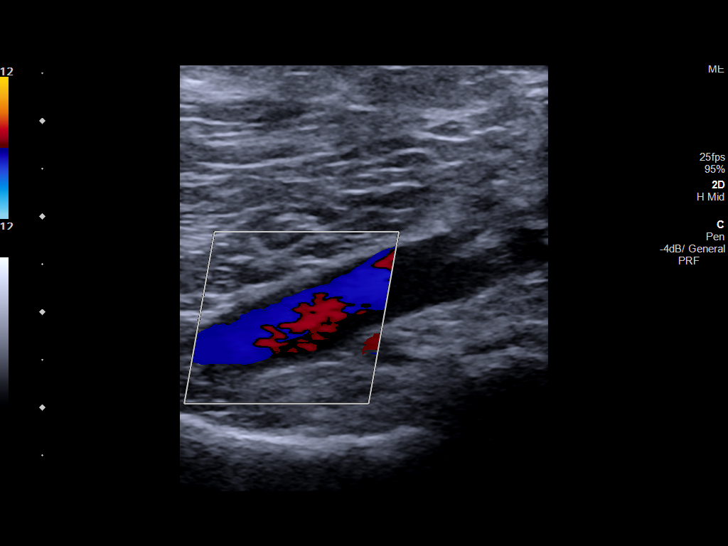
[im 27/33]
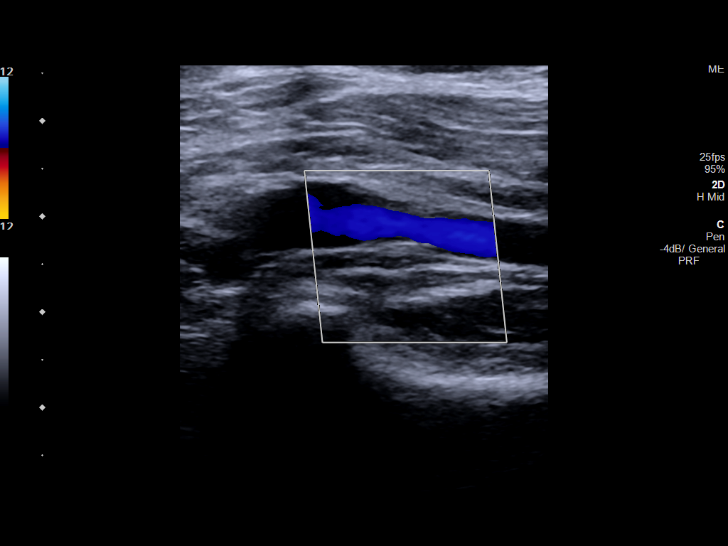
[im 30/33]
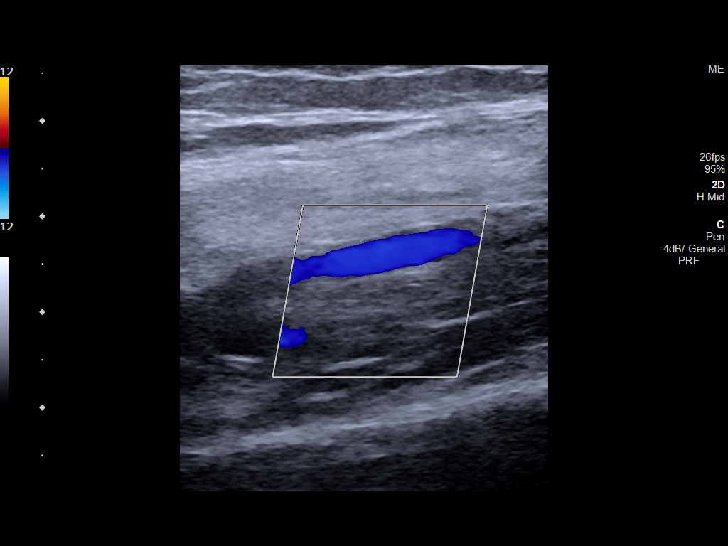
[im 33/33]
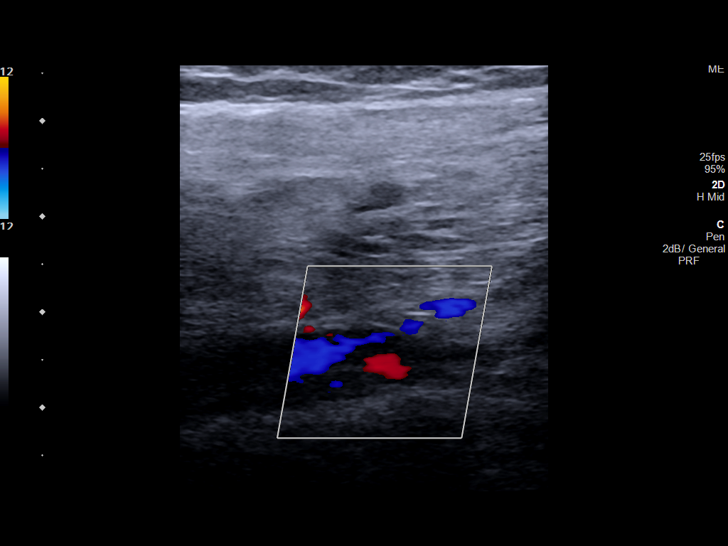

[13 of 24 positions shown; findings below may reference images not displayed]

FINDINGS: Contralateral Common Femoral Vein: Respiratory phasicity is normal
and symmetric with the symptomatic side. No evidence of thrombus.
Normal compressibility.

Common Femoral Vein: No evidence of thrombus. Normal
compressibility, respiratory phasicity and response to augmentation.

Saphenofemoral Junction: No evidence of thrombus. Normal
compressibility and flow on color Doppler imaging.

Profunda Femoral Vein: No evidence of thrombus. Normal
compressibility and flow on color Doppler imaging.

Femoral Vein: No evidence of thrombus. Normal compressibility,
respiratory phasicity and response to augmentation.

Popliteal Vein: No evidence of thrombus. Normal compressibility,
respiratory phasicity and response to augmentation.

Calf Veins: No evidence of thrombus. Normal compressibility and flow
on color Doppler imaging.

Superficial Great Saphenous Vein: No evidence of thrombus. Normal
compressibility and flow on color Doppler imaging.

Other Findings:  None.
IMPRESSION: Sonographic survey of the right lower extremity negative for DVT

## 2018-06-10 MED ORDER — PREDNISONE 10 MG (21) PO TBPK: ORAL_TABLET | ORAL | 0 refills | Status: DC

## 2018-06-10 NOTE — ED Notes (Signed)
Patient walking up and down hallway in Flex.  Gait steady.  NAD

## 2018-06-10 NOTE — ED Notes (Signed)
AAOx3.  Skin warm and dry.  NAD 

## 2018-06-10 NOTE — ED Notes (Signed)
AAOx3.  Skin warm and dry. NAD. C/O pain to right lower leg and ankle area.  No swelling.  + CMS.  + PT and DP pulses palpable.

## 2018-06-10 NOTE — ED Triage Notes (Signed)
Patient presents to the ED with right leg pain.  Patient states, "I've had leg pain as long as I can remember, since I was a teenager."  Patient states pain has been getting worse over the past 6 months.  Patient reports occasional difficulty walking.

## 2018-06-10 NOTE — ED Provider Notes (Signed)
[facility] Emergency Department Provider Note  ____________________________________________  Time seen: Approximately 4:52 PM  I have reviewed the triage vital signs and the nursing notes.   HISTORY  Chief Complaint Leg Pain    HPI Madeline Taylor is a 83 y.o. female with a history of hypertension, presents to the emergency department with "right leg cramps".  Patient states that the cramping occurs around her ankle and shoots up along the posterior aspect of her leg.  Patient states that pain feels like a cramping sensation.  She denies numbness, tingling or weakness of the lower extremities.  Patient states that she has had similar pain in the past but never this bad.  She denies fever and chills at home.  She denies recent falls or traumas.  She denies prior history of DVT.  Patient is observed ambulating without difficulty throughout emergency department.  No other alleviating measures have been attempted.        Past Medical History:  Diagnosis Date  . Essential hypertension 07/29/2016  . History of vertigo 07/29/2016  . Hyperlipidemia 07/29/2016  . Osteoarthritis of back 07/29/2016  . Osteopenia of multiple sites 07/29/2016  . Seasonal allergies 07/29/2016  . Urinary, incontinence, stress female 07/29/2016    Patient Active Problem List   Diagnosis Date Noted  . Osteoarthritis of multiple joints 05/18/2018  . OAB (overactive bladder) 10/16/2016  . Hyperlipidemia 07/29/2016  . Essential hypertension 07/29/2016  . Osteoporosis of femur without pathological fracture 07/29/2016  . Seasonal allergies 07/29/2016  . History of vertigo 07/29/2016  . Urinary, incontinence, stress female 07/29/2016  . Osteoarthritis of back 07/29/2016    Past Surgical History:  Procedure Laterality Date  . no past surgery      Prior to Admission medications   Medication Sig Start Date End Date Taking? Authorizing Provider  aspirin 81 MG tablet Take 81 mg by mouth daily.     [provider]  irbesartan (AVAPRO) 300 MG tablet Take 1 tablet (300 mg total) by mouth daily. 02/17/18   Galen Manila, NP  predniSONE (STERAPRED UNI-PAK 21 TAB) 10 MG (21) TBPK tablet Take 6 tablets the first day, take 5 tablets the second day, take 4 tablets the third day, take 3 tablets the fourth day, take 2 tablets the fifth day, take 1 tablet the sixth day. 06/10/18   Orvil Feil, PA-C    Allergies Lisinopril and Statins  Family History  Problem Relation Age of Onset  . Breast cancer Sister   . Cancer Sister 76       breast cancer  . Alzheimer's disease Mother   . Healthy Father     Social History Social History   Tobacco Use  . Smoking status: Never Smoker  . Smokeless tobacco: Never Used  Substance Use Topics  . Alcohol use: No  . Drug use: No     Review of Systems  Constitutional: No fever/chills Eyes: No visual changes. No discharge ENT: No upper respiratory complaints. Cardiovascular: no chest pain. Respiratory: no cough. No SOB. Gastrointestinal: No abdominal pain.  No nausea, no vomiting.  No diarrhea.  No constipation. Musculoskeletal: Patient has right leg pain.  Skin: Negative for rash, abrasions, lacerations, ecchymosis. Neurological: Negative for headaches, focal weakness or numbness.   ____________________________________________   PHYSICAL EXAM:  VITAL SIGNS: ED Triage Vitals  Enc Vitals Group     BP 06/10/18 1537 (!) 162/82     Pulse Rate 06/10/18 1537 63     Resp 06/10/18  1537 16     Temp 06/10/18 1537 98.1 F (36.7 C)     Temp Source 06/10/18 1537 Oral     SpO2 06/10/18 1537 100 %     Weight 06/10/18 1508 90 lb (40.8 kg)     Height 06/10/18 1508 4\' 8"  (1.422 m)     Head Circumference --      Peak Flow --      Pain Score 06/10/18 1507 0     Pain Loc --      Pain Edu? --      Excl. in GC? --      Constitutional: Alert and oriented. Well appearing and in no acute distress. Eyes: Conjunctivae are normal.  PERRL. EOMI. Head: Atraumatic. Cardiovascular: Normal rate, regular rhythm. Normal S1 and S2.  Good peripheral circulation. Respiratory: Normal respiratory effort without tachypnea or retractions. Lungs CTAB. Good air entry to the bases with no decreased or absent breath sounds. Musculoskeletal: Patient has 5 out of 5 strength in the upper and lower extremities bilaterally and symmetrically.  Full range of motion to all extremities. No gross deformities appreciated.  Negative straight leg raise bilaterally.  No paraspinal muscle tenderness along the lumbar spine. Neurologic:  Normal speech and language. No gross focal neurologic deficits are appreciated.  Skin:  Skin is warm, dry and intact. No rash noted. Psychiatric: Mood and affect are normal. Speech and behavior are normal. Patient exhibits appropriate insight and judgement.   ____________________________________________   LABS (all labs ordered are listed, but only abnormal results are displayed)  Labs Reviewed  COMPREHENSIVE METABOLIC PANEL - Abnormal; Notable for the following components:      Result Value   Creatinine, Ser 1.14 (*)    GFR calc non Af Amer 45 (*)    GFR calc Af Amer 53 (*)    All other components within normal limits  CBC WITH DIFFERENTIAL/PLATELET   ____________________________________________  EKG   ____________________________________________  RADIOLOGY I personally viewed and evaluated these images as part of my medical decision making, as well as reviewing the written report by the radiologist.     [place] Venous Img Lower Unilateral Right  Result Date: 06/10/2018 CLINICAL DATA:  83 year old female with right leg pain and swelling EXAM: RIGHT LOWER EXTREMITY VENOUS DOPPLER ULTRASOUND TECHNIQUE: Gray-scale sonography with graded compression, as well as color Doppler and duplex ultrasound were performed to evaluate the lower extremity deep venous systems from the level of the common femoral vein and  including the common femoral, femoral, profunda femoral, popliteal and calf veins including the posterior tibial, peroneal and gastrocnemius veins when visible. The superficial great saphenous vein was also interrogated. Spectral Doppler was utilized to evaluate flow at rest and with distal augmentation maneuvers in the common femoral, femoral and popliteal veins. COMPARISON:  None. FINDINGS: Contralateral Common Femoral Vein: Respiratory phasicity is normal and symmetric with the symptomatic side. No evidence of thrombus. Normal compressibility. Common Femoral Vein: No evidence of thrombus. Normal compressibility, respiratory phasicity and response to augmentation. Saphenofemoral Junction: No evidence of thrombus. Normal compressibility and flow on color Doppler imaging. Profunda Femoral Vein: No evidence of thrombus. Normal compressibility and flow on color Doppler imaging. Femoral Vein: No evidence of thrombus. Normal compressibility, respiratory phasicity and response to augmentation. Popliteal Vein: No evidence of thrombus. Normal compressibility, respiratory phasicity and response to augmentation. Calf Veins: No evidence of thrombus. Normal compressibility and flow on color Doppler imaging. Superficial Great Saphenous Vein: No evidence of thrombus. Normal compressibility and flow on color  Doppler imaging. Other Findings:  None. IMPRESSION: Sonographic survey of the right lower extremity negative for DVT Electronically Signed   By: Gilmer Mor D.O.   On: 06/10/2018 16:55    ____________________________________________    PROCEDURES  Procedure(s) performed:    Procedures    Medications - No data to display   ____________________________________________   INITIAL IMPRESSION / ASSESSMENT AND PLAN / ED COURSE  Pertinent labs & imaging results that were available during my care of the patient were reviewed by me and considered in my medical decision making (see chart for details).  Review  of the  [place]RS was performed in accordance of the [place]CM[place] prior to dispensing any controlled drugs.         Assessment and Plan: 83 year old female presents to the emergency department with a sensation of right lower extremity cramping that worsened acutely today.  On physical exam, patient is having no difficulty with ambulation.  She performs full range of motion at the right hip, right knee and right ankle.  No right lower extremity erythema is visualized.  She had a negative straight leg raise test on the right.  She had no paraspinal muscle tenderness of the lumbar spine and denied low back pain.  Differential diagnosis included sciatica, right lower extremity DVT, electrolyte imbalance..  Basic labs conducted in the emergency department were reassuring.  Right lower extremity ultrasound revealed no evidence of DVT.  Patient was discharged with prednisone to address likely radicular component to patient's pain.  She was advised to follow-up with her PCP as needed.  All patient questions were answered.      ____________________________________________  FINAL CLINICAL IMPRESSION(S) / ED DIAGNOSES  Final diagnoses:  Right leg pain      NEW MEDICATIONS STARTED DURING THIS VISIT:  ED Discharge Orders         Ordered    predniSONE (STERAPRED UNI-PAK 21 TAB) 10 MG (21) TBPK tablet     06/10/18 1714              This chart was dictated using voice recognition software/Dragon. Despite best efforts to proofread, errors can occur which can change the meaning. Any change was purely unintentional.    Orvil Feil, PA-C 06/10/18 2222    Phineas Semen, MD 06/10/18 [phone]

## 2018-06-28 ENCOUNTER — Telehealth: Payer: Self-pay | Admitting: Nurse Practitioner

## 2018-06-28 NOTE — Chronic Care Management (AMB) (Signed)
°  Chronic Care Management   Outreach Note  06/28/2018 Name: Madeline Taylor MRN: [id number] DOB: 16-Dec-1935  Referred by: Mikey College, NP Reason for referral : No chief complaint on file.   An unsuccessful telephone outreach was attempted today. The patient was referred to the case management team by for assistance with chronic care management and care coordination.   Follow Up Plan: The care management team will reach out to the patient again over the next 7 days.   [place]  [email or web address]   ??[phone]

## 2018-07-04 NOTE — Chronic Care Management (AMB) (Signed)
°  Chronic Care Management   Outreach Note  07/04/2018 Name: Madeline Taylor MRN: [id number] DOB: 03-31-1935  Referred by: Mikey College, NP Reason for referral : Chronic Care Management (Initial CCM outreach call was unsuccessful.) and Chronic Care Management (Second CCM outreach )   A second unsuccessful telephone outreach was attempted today. The patient was referred to the case management team for assistance with chronic care management and care coordination.   Follow Up Plan: The care management team will Taylor out to the patient again over the next 7 days.   [place]  [email or web address]   ??[phone]

## 2018-07-06 NOTE — Chronic Care Management (AMB) (Signed)
Chronic Care Management   Note  07/06/2018 Name: KYLEIGHA MARKERT MRN: [id number] DOB: [birth year]  Madeline Taylor is a 83 y.o. year old female who is a primary care patient of Mikey College, NP. I reached out to Madeline Taylor by phone today in response to a referral sent by Ms. Nafeesah T Lease's health plan.    Ms. Steinmiller was given information about Chronic Care Management services today including:  1. CCM service includes personalized support from designated clinical staff supervised by her physician, including individualized plan of care and coordination with other care providers 2. 24/7 contact phone numbers for assistance for urgent and routine care needs. 3. Service will only be billed when office clinical staff spend 20 minutes or more in a month to coordinate care. 4. Only one practitioner may furnish and bill the service in a calendar month. 5. The patient may stop CCM services at any time (effective at the end of the month) by phone call to the office staff. 6. The patient will be responsible for cost sharing (co-pay) of up to 20% of the service fee (after annual deductible is met).  Patient did not agree to enrollment in care management services and does not wish to consider at this time.  Follow up plan: The patient has been provided with contact information for the chronic care management team and has been advised to call with any health related questions or concerns.   [place]  [email or web address]   ??[phone]

## 2018-08-31 ENCOUNTER — Other Ambulatory Visit: Payer: Self-pay

## 2018-08-31 ENCOUNTER — Ambulatory Visit (INDEPENDENT_AMBULATORY_CARE_PROVIDER_SITE_OTHER): Payer: Medicare Other | Admitting: Nurse Practitioner

## 2018-08-31 ENCOUNTER — Encounter: Payer: Self-pay | Admitting: Nurse Practitioner

## 2018-08-31 VITALS — BP 175/73 | HR 67 | Ht <= 58 in | Wt 107.4 lb

## 2018-08-31 DIAGNOSIS — M25551 Pain in right hip: Secondary | ICD-10-CM

## 2018-08-31 DIAGNOSIS — I10 Essential (primary) hypertension: Secondary | ICD-10-CM

## 2018-08-31 MED ORDER — MELOXICAM 7.5 MG PO TABS
7.5000 mg | ORAL_TABLET | Freq: Every day | ORAL | 0 refills | Status: DC
Start: 2018-08-31 — End: 2018-09-13

## 2018-08-31 NOTE — Progress Notes (Signed)
Subjective:    Patient ID: Madeline Taylor, female    DOB: July 02, 1935, 83 y.o.   MRN: [id number]  Madeline Taylor is a 83 y.o. female presenting on 08/31/2018 for Hip Pain (intermitent chronic Right hip pain that flared up yesterday after doing yard work.)   HPI RIGHT hip pain Patient has been weeding, weed eater broke and she started weeding.  Took "500 mg aspirin"  Yesterday and today.  Has had improvement of pain.  - Patient states she continues to have joint problems, leg cramps every night.  Patient does not remember taking prednisone that was prescribed for her in may.   - Always has more pain when she has "overworked." - Patient takes at most 2 aspirin in a day.  500 mg x 2 doses some days.  This is a regular medication for patient. - She reports no regular stomach pain, n/v, coffee ground emesis, dark/black/tarry stool, BRBPR, or other GI bleeding.   Hypertension - She is not checking BP at home or outside of clinic.    - Current medications: irbesartan 300 mg once daily - misses doses every week, tolerating well without side effects - She is not currently symptomatic. - Pt denies headache, lightheadedness, dizziness, changes in vision, chest tightness/pressure, palpitations, leg swelling, sudden loss of speech or loss of consciousness.  Social History   Tobacco Use  . Smoking status: Never Smoker  . Smokeless tobacco: Never Used  Substance Use Topics  . Alcohol use: No  . Drug use: No    Review of Systems Per HPI unless specifically indicated above     Objective:    BP (!) 175/73 (BP Location: Left Arm, Patient Position: Sitting)   Pulse 67   Ht 4\' 8"  (1.422 m)   Wt 107 lb 6.4 oz (48.7 kg)   BMI 24.08 kg/m   Wt Readings from Last 3 Encounters:  08/31/18 107 lb 6.4 oz (48.7 kg)  06/10/18 90 lb (40.8 kg)  02/17/18 103 lb 3.2 oz (46.8 kg)    Physical Exam Vitals signs reviewed.  Constitutional:      General: She is not in acute distress.    Appearance: She is  well-developed.  HENT:     Head: Normocephalic and atraumatic.  Musculoskeletal:     Comments: RIGHT hip Inspection: Normal appearance bilateral symmetrical Palpation: Non-tender to palpation over anterior, lateral, or posterior hip ROM: Reduced AROM forward flexion, abduction, internal / external rotation, more limited R than L.  Crepitus noted Special Testing: none Strength: Normal strength RIGHT hip 4/5 flex/ext, ext rot / int rot Neurovascular: Distally intact pulses, sensation to light touch   Skin:    General: Skin is warm and dry.  Neurological:     Mental Status: She is alert and oriented to person, place, and time.  Psychiatric:        Behavior: Behavior normal.    Results for orders placed or performed during the hospital encounter of 06/10/18  CBC with Differential  Result Value Ref Range   WBC 5.7 4.0 - 10.5 K/uL   RBC 4.91 3.87 - 5.11 MIL/uL   Hemoglobin 14.6 12.0 - 15.0 g/dL   HCT 44.3 36.0 - 46.0 %   MCV 90.2 80.0 - 100.0 fL   MCH 29.7 26.0 - 34.0 pg   MCHC 33.0 30.0 - 36.0 g/dL   RDW 11.5 11.5 - 15.5 %   Platelets 239 150 - 400 K/uL   nRBC 0.0 0.0 - 0.2 %  Neutrophils Relative % 60 %   Neutro Abs 3.5 1.7 - 7.7 K/uL   Lymphocytes Relative 30 %   Lymphs Abs 1.7 0.7 - 4.0 K/uL   Monocytes Relative 6 %   Monocytes Absolute 0.3 0.1 - 1.0 K/uL   Eosinophils Relative 2 %   Eosinophils Absolute 0.1 0.0 - 0.5 K/uL   Basophils Relative 1 %   Basophils Absolute 0.0 0.0 - 0.1 K/uL   Immature Granulocytes 1 %   Abs Immature Granulocytes 0.03 0.00 - 0.07 K/uL  Comprehensive metabolic panel  Result Value Ref Range   Sodium 139 135 - 145 mmol/L   Potassium 4.1 3.5 - 5.1 mmol/L   Chloride 103 98 - 111 mmol/L   CO2 24 22 - 32 mmol/L   Glucose, Bld 87 70 - 99 mg/dL   BUN 21 8 - 23 mg/dL   Creatinine, Ser 1.611.14 (H) 0.44 - 1.00 mg/dL   Calcium 9.1 8.9 - 09.610.3 mg/dL   Total Protein 7.9 6.5 - 8.1 g/dL   Albumin 4.3 3.5 - 5.0 g/dL   AST 17 15 - 41 U/L   ALT 11 0 - 44  U/L   Alkaline Phosphatase 76 38 - 126 U/L   Total Bilirubin 0.6 0.3 - 1.2 mg/dL   GFR calc non Af Amer 45 (L) >60 mL/min   GFR calc Af Amer 53 (L) >60 mL/min   Anion gap 12 5 - 15      Assessment & Plan:   Problem List Items Addressed This Visit      Cardiovascular and Mediastinum   Essential hypertension Uncontrolled hypertension.  BP goal < 130/80.  Pt is not currently working on lifestyle modifications.  Taking medications tolerating well without side effects. Complications: medication non-adherence.    Plan: 1. Continue taking irbesartan 300 mg once daily - Patient continues to refuse to take an additional type of BP med.  Reviewed risks of uncontrolled hypertension with patient.   2. Obtain labs next visit.  Limit NSAIDs including aspirin.  3. Encouraged heart healthy diet and increasing exercise to 30 minutes most days of the week. 4. Check BP 1-2 x per week at home, keep log, and bring to clinic at next appointment. 5. Follow up 3 months.     Relevant Medications   aspirin 500 MG tablet    Other Visit Diagnoses    Right hip pain    -  Primary Pain likely acute exacerbation of chronic problem with known osteoarthritis.  Patient declines referral in past and today for orthopedics.  Does not desire surgical intervention.   Muscle strain possible complicated by prolonged time pulling weeds.  Plan:  1. Treat with OTC pain meds (acetaminophen) and Rx meloxicam.  Discussed alternate dosing and max dosing. - Discussed avoidance of other OTC NSAIDs and Aspirin when taking meloxicam.  Patient verbalizes understanding. 2. Apply heat and/or ice to affected area. 3. May also apply a muscle rub with lidocaine or lidocaine patch after heat or ice. 4. Follow up 2-4 weeks prn no improvement.  Will benefit from physical therapy, but she declines at this time.   Relevant Medications   meloxicam (MOBIC) 7.5 MG tablet      Meds ordered this encounter  Medications  . meloxicam (MOBIC)  7.5 MG tablet    Sig: Take 1 tablet (7.5 mg total) by mouth daily.    Dispense:  30 tablet    Refill:  0    Order Specific Question:   Supervising Provider  Answer:   Smitty CordsKARAMALEGOS, ALEXANDER J [[id number]]    Follow up plan: Return in about 2 weeks (around 09/14/2018), or if symptoms worsen or fail to improve.  Wilhelmina McardleLauren Travell Desaulniers, DNP, AGPCNP-BC Adult Gerontology Primary Care Nurse Practitioner [place] [facility] 08/31/2018, 2:51 PM

## 2018-08-31 NOTE — Patient Instructions (Addendum)
Madeline Taylor,   Thank you for coming in to clinic today.  1. START meloxicam 7.5 mg once daily as needed.  Take for the next 3 days to help with your hip pain once you pick it up. - may take with Tylenol over the counter if needing additional pain relief.    If you do not like meloxicam, you may take aspirin instead only as needed.  Take no more than 500 mg daily.  Be careful about stomach problems.  While on meloxicam, DO NOT take ANY additional pain medications except for Tylenol.  Please schedule a follow-up appointment with Cassell Smiles, AGNP. Return in about 2 weeks (around 09/14/2018) if symptoms worsen or fail to improve.  Consider a hip joint injection with Dr. Parks Ranger  If you have any other questions or concerns, please feel free to call the clinic or send a message through [place]. You may also schedule an earlier appointment if necessary.  You will receive a survey after today's visit either digitally by e-mail or paper by [employer]. Your experiences and feedback matter to [place].  Please respond so we know how we are doing as we provide care for you.  Cassell Smiles, DNP, AGNP-BC Adult Gerontology Nurse Practitioner [place]

## 2018-09-03 ENCOUNTER — Encounter: Payer: Self-pay | Admitting: Nurse Practitioner

## 2018-09-13 ENCOUNTER — Ambulatory Visit (INDEPENDENT_AMBULATORY_CARE_PROVIDER_SITE_OTHER): Payer: Medicare Other | Admitting: Family Medicine

## 2018-09-13 ENCOUNTER — Ambulatory Visit: Payer: Medicare Other | Admitting: Nurse Practitioner

## 2018-09-13 ENCOUNTER — Encounter: Payer: Self-pay | Admitting: Family Medicine

## 2018-09-13 ENCOUNTER — Other Ambulatory Visit: Payer: Self-pay

## 2018-09-13 VITALS — BP 145/64 | HR 64 | Temp 98.5°F | Resp 16 | Ht <= 58 in | Wt 107.0 lb

## 2018-09-13 DIAGNOSIS — M25551 Pain in right hip: Secondary | ICD-10-CM

## 2018-09-13 DIAGNOSIS — M159 Polyosteoarthritis, unspecified: Secondary | ICD-10-CM

## 2018-09-13 DIAGNOSIS — Z23 Encounter for immunization: Secondary | ICD-10-CM

## 2018-09-13 DIAGNOSIS — M7061 Trochanteric bursitis, right hip: Secondary | ICD-10-CM | POA: Diagnosis not present

## 2018-09-13 DIAGNOSIS — M150 Primary generalized (osteo)arthritis: Secondary | ICD-10-CM | POA: Diagnosis not present

## 2018-09-13 MED ORDER — LIDOCAINE HCL (PF) 1 % IJ SOLN
4.0000 mL | Freq: Once | INTRAMUSCULAR | Status: AC
  Administered 2018-09-13: 4 mL

## 2018-09-13 MED ORDER — METHYLPREDNISOLONE ACETATE 40 MG/ML IJ SUSP
40.0000 mg | Freq: Once | INTRAMUSCULAR | Status: AC
  Administered 2018-09-13: 40 mg via INTRA_ARTICULAR

## 2018-09-13 NOTE — Patient Instructions (Addendum)
Thank you for coming to the office today.  You received a Right Hip joint injection - Lidocaine numbing medicine may ease the pain initially for a few hours until it wears off - As discussed, you may experience a "steroid flare" this evening or within 24-48 hours, anytime medicine is injected into an inflamed joint it can cause the pain to get worse temporarily - Everyone responds differently to these injections, it depends on the patient and the severity of the joint problem, it may provide anywhere from days to weeks, to months of relief. Ideal response is >6 months relief - Try to take it easy for next 1-2 days, avoid over activity and strain on joint (limit walking) - Recommend the following:   - For swelling - rest, compression sleeve / ACE wrap, elevation, and ice packs as needed for first few days   - For pain in future may use heating pad or moist heat as needed  Medication - Can use Aspirin - but HOLD for tomorrow then restart it - Continue Tylenol, muscle rub, ice packs, heat  If not improving as expected over next several weeks, please follow-up sooner for re-evaluation.   May need X-ray, Physical Therapy,and or Orthopedics - can return to Dr Mack Guise   Please schedule a Follow-up Appointment to: Return in about 4 weeks (around 10/11/2018), or if symptoms worsen or fail to improve, for hip pain.  If you have any other questions or concerns, please feel free to call the office or send a message through [place]. You may also schedule an earlier appointment if necessary.  Additionally, you may be receiving a survey about your experience at our office within a few days to 1 week by e-mail or mail. We value your feedback.  Madeline Putnam, DO [place]

## 2018-09-13 NOTE — Progress Notes (Addendum)
Subjective:    Patient ID: Madeline Taylor, female    DOB: 05-07-1935, 83 y.o.   MRN: [id number]  Madeline Taylor is a 83 y.o. female presenting on 09/13/2018 for Hip Pain (right side)  PCP is [name], AGPCNP-BC  HPI   Chronic R hip pain / osteoarthritis multiple joints, hips - Last visit with PCP recently on 08/31/18 for same issue, treated with meloxicam 7.5 daily, see prior notes for background information. - Interval update with limited improvement on Meloxicam  - Today patient reports still persistent R hip pain episodic, worse with increased activity, including yard work and bending and standing on feet. Pain difficulty at times with flare while walking. Uses cane for ambulation often. - Taking Meloxicam 7.5mg  daily without relief, stopped few day ago. She had prior Prednisone in past. - Taking Aspirin 500mg  PRN, she took Aspirin 81 previously - She has history of arthritis, in past, other joints, has had previous orthopedic management of shoulder, she has not had recent hip x-ray, or physical therapy - Admits some fall recently without significant injury - Denies any swelling redness fever chills sweats other joint injury  Health Maintenance: Due for High dose Flu Shot, will receive today    Depression screen [facility] 2/9 11/08/2017 11/04/2016 07/29/2016  Decreased Interest 0 0 0  Down, Depressed, Hopeless 0 0 0  PHQ - 2 Score 0 0 0  Altered sleeping - - 0  Tired, decreased energy - - 0  Change in appetite - - 0  Feeling bad or failure about yourself  - - 0  Trouble concentrating - - 0  Moving slowly or fidgety/restless - - 0  Suicidal thoughts - - 0  PHQ-9 Score - - 0    Social History   Tobacco Use  . Smoking status: Never Smoker  . Smokeless tobacco: Never Used  Substance Use Topics  . Alcohol use: No  . Drug use: No    Review of Systems Per HPI unless specifically indicated above     Objective:    BP (!) 145/64   Pulse 64   Temp 98.5 F (36.9 C) (Oral)    Resp 16   Ht 4\' 8"  (1.422 m)   Wt 107 lb (48.5 kg)   BMI 23.99 kg/m   Wt Readings from Last 3 Encounters:  09/13/18 107 lb (48.5 kg)  08/31/18 107 lb 6.4 oz (48.7 kg)  06/10/18 90 lb (40.8 kg)    Physical Exam Vitals signs and nursing note reviewed.  Constitutional:      General: She is not in acute distress.    Appearance: She is well-developed. She is not diaphoretic.     Comments: Well-appearing, comfortable, cooperative  HENT:     Head: Normocephalic and atraumatic.  Eyes:     General:        Right eye: No discharge.        Left eye: No discharge.     Conjunctiva/sclera: Conjunctivae normal.  Cardiovascular:     Rate and Rhythm: Normal rate.  Pulmonary:     Effort: Pulmonary effort is normal.  Musculoskeletal:     Comments: R hip and Greater Trochanter Inspection: HIP - Normal appearance, symmetrical, no obvious leg length or pelvis deformity  Palpation: HIP - Mild tender to palpation deeper R greater trochanter region of lateral upper thigh. Lower extremity thigh calf soft non tender no spasm.  ROM: HIP - Right hip flexion limited flex/ext with stiffness and soreness. Reduced internal rotation with  some reproduced discomfort.  Strength: Bilateral hip flex/ext 5/5, knee flex/ext 5/5, ankle dorsiflex/plantarflex 5/5 Neurovascular: intact distal sensation to light touch   Skin:    General: Skin is warm and dry.     Findings: No erythema or rash.  Neurological:     Mental Status: She is alert and oriented to person, place, and time.  Psychiatric:        Behavior: Behavior normal.     Comments: Well groomed, good eye contact, normal speech and thoughts      ________________________________________________________ PROCEDURE NOTE Date: 09/13/18 Right Hip Trochanteric injection Discussed benefits and risks (including pain, bleeding, infection, steroid flare). Verbal consent given by patient. Medication:  1 cc Depo-medrol 40mg  and 4 cc Lidocaine 1% without epi  Time Out taken  Trochanteric bursa Patient in lateral decubitus position with affected RIGHT hip superior. Landmarks identified over RIGHT greater trochanter. Palpated to identify point of maximum tenderness over bony process. Area cleansed with alcohol wipes. Using 21 gauage and 1, 1/2 inch needle, Right trochanteric bursa space was injected (with above listed medication) with needle to bone contact then slightly withdrawn to inject, cold spray used for superficial anesthetic. Sterile bandage placed. Patient tolerated procedure well without bleeding or paresthesias. No complications.    Results for orders placed or performed during the hospital encounter of 06/10/18  CBC with Differential  Result Value Ref Range   WBC 5.7 4.0 - 10.5 K/uL   RBC 4.91 3.87 - 5.11 MIL/uL   Hemoglobin 14.6 12.0 - 15.0 g/dL   HCT 44.3 36.0 - 46.0 %   MCV 90.2 80.0 - 100.0 fL   MCH 29.7 26.0 - 34.0 pg   MCHC 33.0 30.0 - 36.0 g/dL   RDW 11.5 11.5 - 15.5 %   Platelets 239 150 - 400 K/uL   nRBC 0.0 0.0 - 0.2 %   Neutrophils Relative % 60 %   Neutro Abs 3.5 1.7 - 7.7 K/uL   Lymphocytes Relative 30 %   Lymphs Abs 1.7 0.7 - 4.0 K/uL   Monocytes Relative 6 %   Monocytes Absolute 0.3 0.1 - 1.0 K/uL   Eosinophils Relative 2 %   Eosinophils Absolute 0.1 0.0 - 0.5 K/uL   Basophils Relative 1 %   Basophils Absolute 0.0 0.0 - 0.1 K/uL   Immature Granulocytes 1 %   Abs Immature Granulocytes 0.03 0.00 - 0.07 K/uL  Comprehensive metabolic panel  Result Value Ref Range   Sodium 139 135 - 145 mmol/L   Potassium 4.1 3.5 - 5.1 mmol/L   Chloride 103 98 - 111 mmol/L   CO2 24 22 - 32 mmol/L   Glucose, Bld 87 70 - 99 mg/dL   BUN 21 8 - 23 mg/dL   Creatinine, Ser 1.14 (H) 0.44 - 1.00 mg/dL   Calcium 9.1 8.9 - 10.3 mg/dL   Total Protein 7.9 6.5 - 8.1 g/dL   Albumin 4.3 3.5 - 5.0 g/dL   AST 17 15 - 41 U/L   ALT 11 0 - 44 U/L   Alkaline Phosphatase 76 38 - 126 U/L   Total Bilirubin 0.6 0.3 - 1.2 mg/dL   GFR calc non Af  Amer 45 (L) >60 mL/min   GFR calc Af Amer 53 (L) >60 mL/min   Anion gap 12 5 - 15      Assessment & Plan:   Problem List Items Addressed This Visit    Osteoarthritis of multiple joints    Other Visit Diagnoses    Right  hip pain    -  Primary   Relevant Medications   lidocaine (PF) (XYLOCAINE) 1 % injection 4 mL (Completed)   methylPREDNISolone acetate (DEPO-MEDROL) injection 40 mg (Completed)   Needs flu shot       Relevant Orders   Flu Vaccine QUAD High Dose(Fluad) (Completed)   Trochanteric bursitis of right hip          subacute on chronic problem with R Hip pain, likely trochanteric bursitis given history and exam with point tenderness. Suspect underlying mild osteoarthritis as possible cause, with falls but no distinct injury, flare up recently with yard work. H/o OA in other joints - No radiation of pain or radicular symptoms not responding to conservative therapy  Plan: 1. Proceed with R Hip steroid trochanteric bursa injection today - May continue NSAID PRN if benefit - Consider Tylenol, muscle rub, heat/ice - Future consider Muscle relaxant baclofen if needed additional med vs Gabapentin for joint/nerve pain  Encouraged use of heating pad 1-2x daily for now then PRN. Goal to work on hip stretching ROM exercises  Follow-up 4-6 weeks if not improving, consider hip x-rays, may follow up with already established Ortho if needed   Meds ordered this encounter  Medications  . lidocaine (PF) (XYLOCAINE) 1 % injection 4 mL  . methylPREDNISolone acetate (DEPO-MEDROL) injection 40 mg      Follow up plan: Return in about 4 weeks (around 10/11/2018), or if symptoms worsen or fail to improve, for hip pain.   Saralyn PilarAlexander Bernisha Verma, DO [facility] 09/13/2018, 1:33 PM

## 2018-10-25 ENCOUNTER — Other Ambulatory Visit: Payer: Self-pay | Admitting: Nurse Practitioner

## 2018-10-25 DIAGNOSIS — I10 Essential (primary) hypertension: Secondary | ICD-10-CM

## 2018-10-26 ENCOUNTER — Telehealth: Payer: Self-pay | Admitting: Nurse Practitioner

## 2018-10-26 NOTE — Telephone Encounter (Signed)
Pt requesting refill on  BP medication [place]

## 2018-10-30 NOTE — Telephone Encounter (Signed)
I attempted to contact the pt to notify them that Ms. Trost prescription was sent over to [facility] on 10/25/18. Mr. Remund had a years worth of prescription sent over to [facility] back in 06/2018/

## 2018-10-30 NOTE — Telephone Encounter (Signed)
Pt's husband came by asking if the rx they had requested had been sent in.  Madeline Taylor

## 2018-10-31 NOTE — Telephone Encounter (Signed)
The pt was notified about his medication refills. He verbalize understanding, no questions or concerns

## 2018-11-24 ENCOUNTER — Telehealth: Payer: Self-pay | Admitting: Nurse Practitioner

## 2018-11-24 NOTE — Telephone Encounter (Signed)
I called the patient to schedule AWV with Tiffany, but there was no answer and no option to leave a message. VDM (DD) 

## 2018-12-29 ENCOUNTER — Emergency Department: Payer: Medicare Other

## 2018-12-29 ENCOUNTER — Other Ambulatory Visit: Payer: Self-pay

## 2018-12-29 DIAGNOSIS — Z5320 Procedure and treatment not carried out because of patient's decision for unspecified reasons: Secondary | ICD-10-CM | POA: Insufficient documentation

## 2018-12-29 DIAGNOSIS — E440 Moderate protein-calorie malnutrition: Secondary | ICD-10-CM | POA: Diagnosis not present

## 2018-12-29 DIAGNOSIS — K851 Biliary acute pancreatitis without necrosis or infection: Secondary | ICD-10-CM | POA: Diagnosis not present

## 2018-12-29 DIAGNOSIS — J189 Pneumonia, unspecified organism: Secondary | ICD-10-CM | POA: Diagnosis not present

## 2018-12-29 DIAGNOSIS — Z79899 Other long term (current) drug therapy: Secondary | ICD-10-CM | POA: Insufficient documentation

## 2018-12-29 DIAGNOSIS — R109 Unspecified abdominal pain: Secondary | ICD-10-CM | POA: Insufficient documentation

## 2018-12-29 DIAGNOSIS — Z7982 Long term (current) use of aspirin: Secondary | ICD-10-CM | POA: Insufficient documentation

## 2018-12-29 DIAGNOSIS — R112 Nausea with vomiting, unspecified: Secondary | ICD-10-CM | POA: Insufficient documentation

## 2018-12-29 DIAGNOSIS — I10 Essential (primary) hypertension: Secondary | ICD-10-CM | POA: Insufficient documentation

## 2018-12-29 DIAGNOSIS — N179 Acute kidney failure, unspecified: Secondary | ICD-10-CM | POA: Diagnosis not present

## 2018-12-29 DIAGNOSIS — Z20828 Contact with and (suspected) exposure to other viral communicable diseases: Secondary | ICD-10-CM | POA: Diagnosis not present

## 2018-12-29 LAB — CBC
HCT: 47.3 % — ABNORMAL HIGH (ref 36.0–46.0)
Hemoglobin: 15.3 g/dL — ABNORMAL HIGH (ref 12.0–15.0)
MCH: 29.4 pg (ref 26.0–34.0)
MCHC: 32.3 g/dL (ref 30.0–36.0)
MCV: 90.8 fL (ref 80.0–100.0)
Platelets: 254 10*3/uL (ref 150–400)
RBC: 5.21 MIL/uL — ABNORMAL HIGH (ref 3.87–5.11)
RDW: 11.7 % (ref 11.5–15.5)
WBC: 14.9 10*3/uL — ABNORMAL HIGH (ref 4.0–10.5)
nRBC: 0 % (ref 0.0–0.2)

## 2018-12-29 LAB — COMPREHENSIVE METABOLIC PANEL
ALT: 225 U/L — ABNORMAL HIGH (ref 0–44)
AST: 399 U/L — ABNORMAL HIGH (ref 15–41)
Albumin: 4.3 g/dL (ref 3.5–5.0)
Alkaline Phosphatase: 139 U/L — ABNORMAL HIGH (ref 38–126)
Anion gap: 12 (ref 5–15)
BUN: 23 mg/dL (ref 8–23)
CO2: 26 mmol/L (ref 22–32)
Calcium: 9.5 mg/dL (ref 8.9–10.3)
Chloride: 105 mmol/L (ref 98–111)
Creatinine, Ser: 0.82 mg/dL (ref 0.44–1.00)
GFR calc Af Amer: >60 mL/min (ref >60)
GFR calc non Af Amer: >60 mL/min (ref >60)
Glucose, Bld: 176 mg/dL — ABNORMAL HIGH (ref 70–99)
Potassium: 3 mmol/L — ABNORMAL LOW (ref 3.5–5.1)
Sodium: 143 mmol/L (ref 135–145)
Total Bilirubin: 1.6 mg/dL — ABNORMAL HIGH (ref 0.3–1.2)
Total Protein: 7.9 g/dL (ref 6.5–8.1)

## 2018-12-29 LAB — URINALYSIS, COMPLETE (UACMP) WITH MICROSCOPIC
Bilirubin Urine: NEGATIVE
Glucose, UA: NEGATIVE mg/dL
Hgb urine dipstick: NEGATIVE
Ketones, ur: NEGATIVE mg/dL
Leukocytes,Ua: NEGATIVE
Nitrite: NEGATIVE
Protein, ur: NEGATIVE mg/dL
Specific Gravity, Urine: 1.01 (ref 1.005–1.030)
pH: 6 (ref 5.0–8.0)

## 2018-12-29 LAB — LIPASE, BLOOD: Lipase: 3922 U/L — ABNORMAL HIGH (ref 11–51)

## 2018-12-29 LAB — TROPONIN I (HIGH SENSITIVITY): Troponin I (High Sensitivity): 8 ng/L (ref <18)

## 2018-12-29 MED ORDER — ONDANSETRON HCL 4 MG/2ML IJ SOLN: 4.0000 mg | Freq: Once | INTRAMUSCULAR | Status: DC

## 2018-12-29 MED ORDER — SODIUM CHLORIDE 0.9 % IV BOLUS: 1000.0000 mL | Freq: Once | INTRAVENOUS | Status: DC

## 2018-12-29 MED ORDER — SODIUM CHLORIDE 0.9% FLUSH: 3.0000 mL | Freq: Once | INTRAVENOUS | Status: DC

## 2018-12-29 NOTE — ED Provider Notes (Signed)
[facility] Emergency Department Provider Note  ____________________________________________   None    (approximate)   I have reviewed the triage vital signs and the nursing notes.   Patient has been triaged with a MSE exam performed by myself at a minimum. Based on symptoms and screening exam, patient may receive a more in-depth exam, labs, imaging as detailed below. Patients have been advised of this setting and exam type at the time of patient interview.    HISTORY  Chief Complaint Abdominal Pain    HPI Madeline Taylor is a 83 y.o. female presents to the emergency department with a complaint of diffuse abdominal pain, nausea, vomiting.  Patient states that symptoms began today.  Patient is complaining of diffuse abdominal pain, constant nausea and vomiting.  Denies diarrhea.  Denies fevers and chills, URI symptoms.  Patient denies any chest pain or shortness of breath.  No history of chronic GI complaints.  No medications for his complaint prior to arrival..   Patient will receive a medical screening exam as detailed below.  Based off of this exam, more in depth exam, labs, imaging will be performed as needed for complaint.  Patient care will be eventually transferred to another provider in the emergency department for final exam, diagnosis and disposition.    Past Medical History:  Diagnosis Date  . Essential hypertension 07/29/2016  . History of vertigo 07/29/2016  . Hyperlipidemia 07/29/2016  . Osteoarthritis of back 07/29/2016  . Osteopenia of multiple sites 07/29/2016  . Seasonal allergies 07/29/2016  . Urinary, incontinence, stress female 07/29/2016    Patient Active Problem List   Diagnosis Date Noted  . Osteoarthritis of multiple joints 05/18/2018  . OAB (overactive bladder) 10/16/2016  . Hyperlipidemia 07/29/2016  . Essential hypertension 07/29/2016  . Osteoporosis of femur without pathological fracture 07/29/2016  . Seasonal allergies  07/29/2016  . History of vertigo 07/29/2016  . Urinary, incontinence, stress female 07/29/2016  . Osteoarthritis of back 07/29/2016    Past Surgical History:  Procedure Laterality Date  . no past surgery      Prior to Admission medications   Medication Sig Start Date End Date Taking? Authorizing Provider  aspirin 500 MG tablet Take 500 mg by mouth every 6 (six) hours as needed for pain.    [provider]  aspirin 81 MG tablet Take 81 mg by mouth daily.    [provider]  irbesartan (AVAPRO) 300 MG tablet Take 1 tablet by mouth once daily 10/25/18   Galen Manila, NP    Allergies Lisinopril and Statins  Family History  Problem Relation Age of Onset  . Breast cancer Sister   . Cancer Sister 37       breast cancer  . Alzheimer's disease Mother   . Healthy Father     Social History Social History   Tobacco Use  . Smoking status: Never Smoker  . Smokeless tobacco: Never Used  Substance Use Topics  . Alcohol use: No  . Drug use: No    Review of Systems Constitutional: no fever ENT: no nasal congestion/rhinorhea. no sore throat Cardiovascular: no chest pain. Respiratory: no cough. no shortness of breath/difficulty breathing Gastroenterology: diffuse abdominal pain.  Positive for nausea, vomiting Musculoskeletal: no for musculoskeletal pain Integumentary: Negative for rash. Neurological: No focal weakness nor numbness.   ____________________________________________   PHYSICAL EXAM:  VITAL SIGNS: ED Triage Vitals  Enc Vitals Group     BP 12/29/18 2056 (!) 214/67  Pulse Rate 12/29/18 2056 61     Resp 12/29/18 2056 16     Temp 12/29/18 2046 (!) 97.5 F (36.4 C)     Temp Source 12/29/18 2046 Oral     SpO2 12/29/18 2056 100 %     Weight 12/29/18 2047 104 lb (47.2 kg)     Height 12/29/18 2047 4\' 8"  (1.422 m)     Head Circumference --      Peak Flow --      Pain Score 12/29/18 2046 9     Pain Loc --      Pain Edu? --      Excl.  in [place]? --     Constitutional: Alert and oriented. Generally well appearing and in no acute distress. Eyes: Conjunctivae are normal.  Nose: No significant congestion/rhinnorhea. Mouth: Unable to assess as patient is vomiting Neck: No stridor.  No meningeal signs.   Cardiovascular: Grossly normal heart sounds. Respiratory: Normal respiratory effort without significant tachypnea and no observed retractions. Lungs CTAB Gastrointestinal: No significant visible abdominal wall findings.  Bowel sounds x4 quadrants.  Diffuse tenderness to palpation in all quadrants.  No palpable abnormality. Musculoskeletal: No gross deformities of extremities. Neurologic:  Normal speech and language. No gross focal neurologic deficits are appreciated.  Skin:  Skin is warm, dry and intact. No rash noted.    ____________________________________________   LABS (all labs ordered are listed, but only abnormal results are displayed)  Labs Reviewed  CBC - Abnormal; Notable for the following components:      Result Value   WBC 14.9 (*)    RBC 5.21 (*)    Hemoglobin 15.3 (*)    HCT 47.3 (*)    All other components within normal limits  LIPASE, BLOOD  COMPREHENSIVE METABOLIC PANEL  URINALYSIS, COMPLETE (UACMP) WITH MICROSCOPIC  TROPONIN I (HIGH SENSITIVITY)    ____________________________________________   RADIOLOGY   Official radiology report(s): No results found.  ____________________________________________    INITIAL IMPRESSION / MDM / ASSESSMENT AND PLAN / ED COURSE  As part of my medical decision making, I reviewed the following data within the electronic MEDICAL RECORD NUMBER Notes from prior ED visits and [place] Controlled Substance Database      Clinical Impression: Abdominal pain, nausea and vomiting  Plan: Labs, IV, fluids, antiemetics, CT.  Patient presented to emergency department with new onset diffuse abdominal pain, significant nausea and vomiting.  Patient was vomiting throughout  encounter in triage.  Diffuse abdominal pain on exam.  Patient denied any other complaints.  No fevers or chills or URI complaints.  No chest pain or shortness of breath.  Patient did have an elevated blood pressure reading in triage.  Given findings patient will be evaluated with labs, CT scan.  Fluids and antiemetics pending results from work-up.  Patient has been screened based based on their arrival complaint, evaluated for an emergent condition, and at a minimum has received a medical screening exam.  At this time, patient will receive the further work-up listed above that was determined by medical screening exam.  Patient care will be transferred to another provider in the emergency department once patient is roomed for final diagnosis and disposition.    ____________________________________________  Note:  This document was prepared using Systems analyst and may include unintentional dictation errors.    Brynda Peon 12/29/18 2124    Nena Polio, MD 12/29/18 [phone]

## 2018-12-29 NOTE — ED Triage Notes (Signed)
Pt to the er for abd pain, nausea and vomiting. Pt has been vomiting in triage. Pt is HTN in triage.

## 2018-12-30 ENCOUNTER — Emergency Department
Admission: EM | Admit: 2018-12-30 | Discharge: 2018-12-30 | Discharge status: Against Medical Advice | Payer: Medicare Other | Source: Home / Self Care

## 2018-12-30 NOTE — ED Notes (Signed)
Pt noted to no longer be in the lobby. Checked in bathroom and not there.

## 2018-12-31 ENCOUNTER — Observation Stay: Payer: Medicare Other

## 2018-12-31 ENCOUNTER — Other Ambulatory Visit: Payer: Self-pay

## 2018-12-31 ENCOUNTER — Emergency Department: Payer: Medicare Other

## 2018-12-31 ENCOUNTER — Inpatient Hospital Stay
Admission: EM | Admit: 2018-12-31 | Discharge: 2019-01-06 | DRG: 417 | Disposition: A | Discharge status: Home / Self Care | Payer: Medicare Other | Attending: Internal Medicine | Admitting: Internal Medicine

## 2018-12-31 ENCOUNTER — Encounter: Payer: Self-pay | Admitting: Emergency Medicine

## 2018-12-31 DIAGNOSIS — J189 Pneumonia, unspecified organism: Secondary | ICD-10-CM | POA: Diagnosis not present

## 2018-12-31 DIAGNOSIS — M1990 Unspecified osteoarthritis, unspecified site: Secondary | ICD-10-CM | POA: Diagnosis present

## 2018-12-31 DIAGNOSIS — Z803 Family history of malignant neoplasm of breast: Secondary | ICD-10-CM

## 2018-12-31 DIAGNOSIS — Z888 Allergy status to other drugs, medicaments and biological substances status: Secondary | ICD-10-CM

## 2018-12-31 DIAGNOSIS — Z6821 Body mass index (BMI) 21.0-21.9, adult: Secondary | ICD-10-CM

## 2018-12-31 DIAGNOSIS — K851 Biliary acute pancreatitis without necrosis or infection: Secondary | ICD-10-CM | POA: Diagnosis not present

## 2018-12-31 DIAGNOSIS — K8020 Calculus of gallbladder without cholecystitis without obstruction: Secondary | ICD-10-CM | POA: Diagnosis present

## 2018-12-31 DIAGNOSIS — I10 Essential (primary) hypertension: Secondary | ICD-10-CM | POA: Diagnosis present

## 2018-12-31 DIAGNOSIS — Z20828 Contact with and (suspected) exposure to other viral communicable diseases: Secondary | ICD-10-CM | POA: Diagnosis present

## 2018-12-31 DIAGNOSIS — E440 Moderate protein-calorie malnutrition: Secondary | ICD-10-CM | POA: Insufficient documentation

## 2018-12-31 DIAGNOSIS — Z7982 Long term (current) use of aspirin: Secondary | ICD-10-CM

## 2018-12-31 DIAGNOSIS — M8580 Other specified disorders of bone density and structure, unspecified site: Secondary | ICD-10-CM | POA: Diagnosis present

## 2018-12-31 DIAGNOSIS — E785 Hyperlipidemia, unspecified: Secondary | ICD-10-CM | POA: Diagnosis present

## 2018-12-31 DIAGNOSIS — R109 Unspecified abdominal pain: Secondary | ICD-10-CM

## 2018-12-31 DIAGNOSIS — K859 Acute pancreatitis without necrosis or infection, unspecified: Secondary | ICD-10-CM | POA: Diagnosis present

## 2018-12-31 DIAGNOSIS — N179 Acute kidney failure, unspecified: Secondary | ICD-10-CM | POA: Diagnosis present

## 2018-12-31 DIAGNOSIS — E876 Hypokalemia: Secondary | ICD-10-CM | POA: Diagnosis not present

## 2018-12-31 DIAGNOSIS — K8050 Calculus of bile duct without cholangitis or cholecystitis without obstruction: Secondary | ICD-10-CM

## 2018-12-31 DIAGNOSIS — R918 Other nonspecific abnormal finding of lung field: Secondary | ICD-10-CM

## 2018-12-31 DIAGNOSIS — E860 Dehydration: Secondary | ICD-10-CM | POA: Diagnosis present

## 2018-12-31 LAB — URINALYSIS, COMPLETE (UACMP) WITH MICROSCOPIC
Bilirubin Urine: NEGATIVE
Glucose, UA: NEGATIVE mg/dL
Ketones, ur: 20 mg/dL — AB
Leukocytes,Ua: NEGATIVE
Nitrite: NEGATIVE
Protein, ur: 30 mg/dL — AB
Specific Gravity, Urine: 1.041 — ABNORMAL HIGH (ref 1.005–1.030)
Squamous Epithelial / HPF: NONE SEEN (ref 0–5)
pH: 6 (ref 5.0–8.0)

## 2018-12-31 LAB — CBC
HCT: 47 % — ABNORMAL HIGH (ref 36.0–46.0)
Hemoglobin: 15.1 g/dL — ABNORMAL HIGH (ref 12.0–15.0)
MCH: 29.2 pg (ref 26.0–34.0)
MCHC: 32.1 g/dL (ref 30.0–36.0)
MCV: 90.9 fL (ref 80.0–100.0)
Platelets: 199 10*3/uL (ref 150–400)
RBC: 5.17 MIL/uL — ABNORMAL HIGH (ref 3.87–5.11)
RDW: 12.1 % (ref 11.5–15.5)
WBC: 10.9 10*3/uL — ABNORMAL HIGH (ref 4.0–10.5)
nRBC: 0 % (ref 0.0–0.2)

## 2018-12-31 LAB — COMPREHENSIVE METABOLIC PANEL
ALT: 95 U/L — ABNORMAL HIGH (ref 0–44)
AST: 59 U/L — ABNORMAL HIGH (ref 15–41)
Albumin: 4.2 g/dL (ref 3.5–5.0)
Alkaline Phosphatase: 86 U/L (ref 38–126)
Anion gap: 14 (ref 5–15)
BUN: 42 mg/dL — ABNORMAL HIGH (ref 8–23)
CO2: 25 mmol/L (ref 22–32)
Calcium: 8.4 mg/dL — ABNORMAL LOW (ref 8.9–10.3)
Chloride: 99 mmol/L (ref 98–111)
Creatinine, Ser: 1.01 mg/dL — ABNORMAL HIGH (ref 0.44–1.00)
GFR calc Af Amer: >60 mL/min (ref >60)
GFR calc non Af Amer: 52 mL/min — ABNORMAL LOW (ref >60)
Glucose, Bld: 127 mg/dL — ABNORMAL HIGH (ref 70–99)
Potassium: 3.3 mmol/L — ABNORMAL LOW (ref 3.5–5.1)
Sodium: 138 mmol/L (ref 135–145)
Total Bilirubin: 1.7 mg/dL — ABNORMAL HIGH (ref 0.3–1.2)
Total Protein: 8.1 g/dL (ref 6.5–8.1)

## 2018-12-31 LAB — PROCALCITONIN: Procalcitonin: 25.26 ng/mL

## 2018-12-31 LAB — LIPASE, BLOOD: Lipase: 322 U/L — ABNORMAL HIGH (ref 11–51)

## 2018-12-31 LAB — FERRITIN: Ferritin: 216 ng/mL (ref 11–307)

## 2018-12-31 LAB — FIBRIN DERIVATIVES D-DIMER (ARMC ONLY): Fibrin derivatives D-dimer (ARMC): >7500 ng{FEU}/mL — ABNORMAL HIGH (ref 0.00–499.00)

## 2018-12-31 IMAGING — CT CT ABD-PELV W/ CM
2 of 5 series · 14 of 46 positions shown, 16 images · IV contrast (APPLIED)
Comparison: None.

CLINICAL DATA: Sharp lower abdominal pain beginning approximately 3
days ago, also with nausea, vomiting and diarrhea. Elevated lipase.

EXAM:
CT ABDOMEN AND PELVIS WITH CONTRAST
TECHNIQUE: Multidetector CT imaging of the abdomen and pelvis was performed
using the standard protocol following bolus administration of
intravenous contrast.
CONTRAST:  75mL OMNIPAQUE IOHEXOL 300 MG/ML  SOLN

[Series 2: routine abd/pel with · axial · 0.65mm/px · z∈[-436,-61]mm · 11 of 86 slices shown, 13 images]
[im 6/86  soft-tissue]
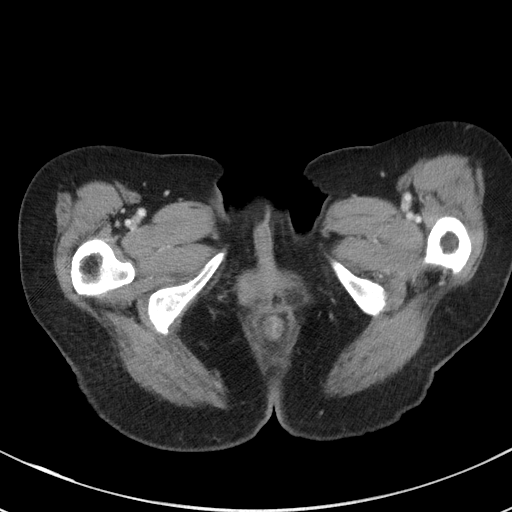
[im 6/86  bone]
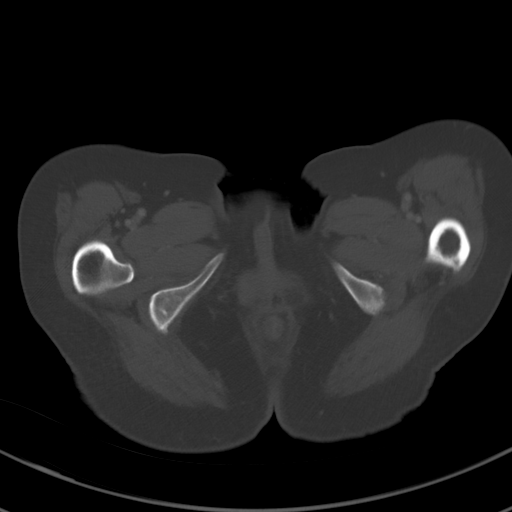
[im 16/86  soft-tissue]
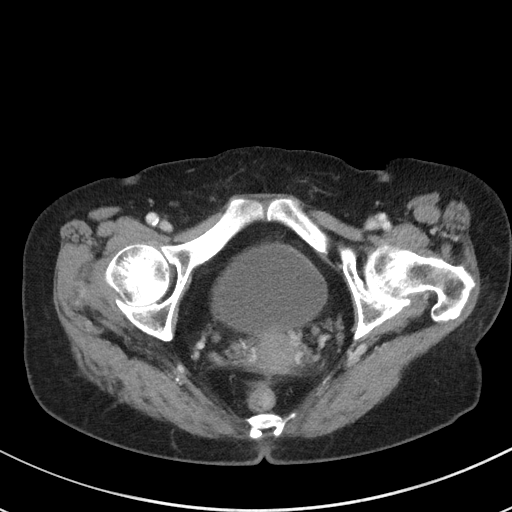
[im 21/86  soft-tissue]
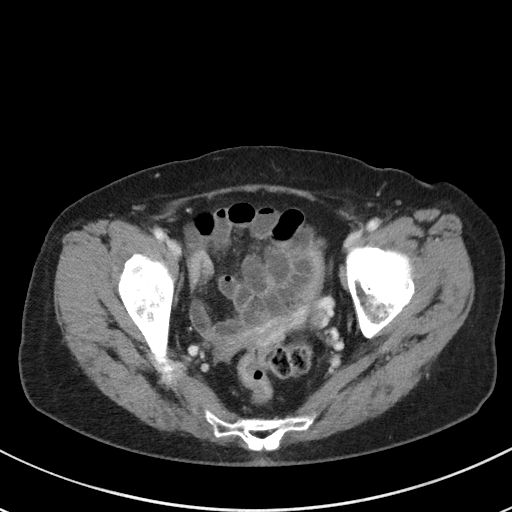
[im 31/86  soft-tissue]
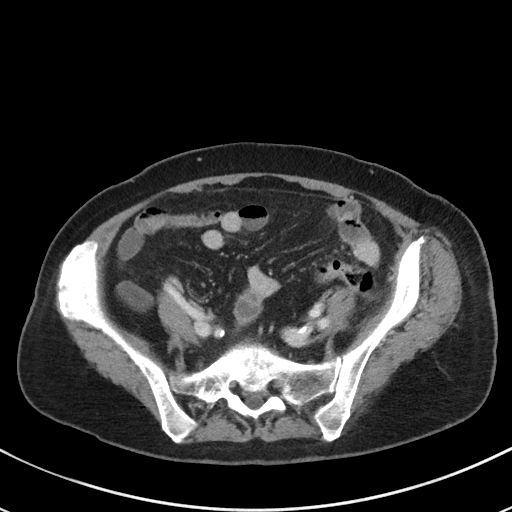
[im 36/86  soft-tissue]
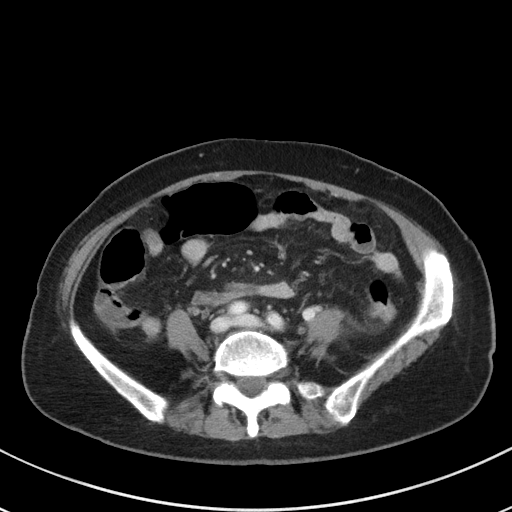
[im 46/86  soft-tissue]
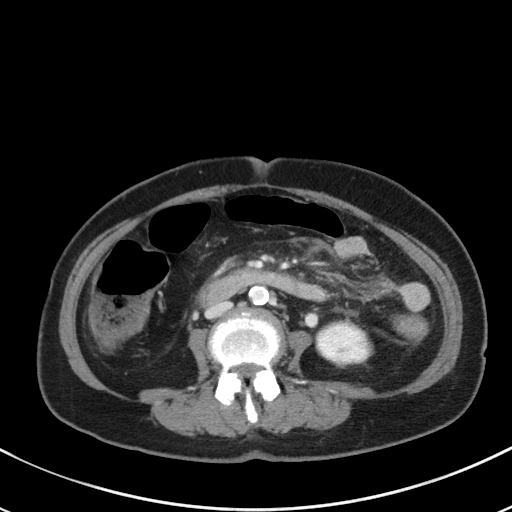
[im 51/86  soft-tissue]
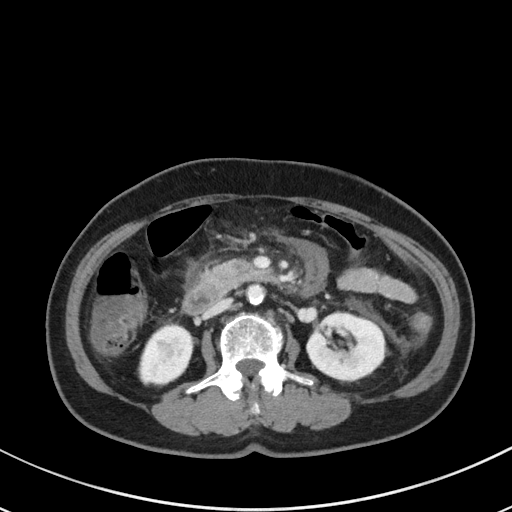
[im 56/86  soft-tissue]
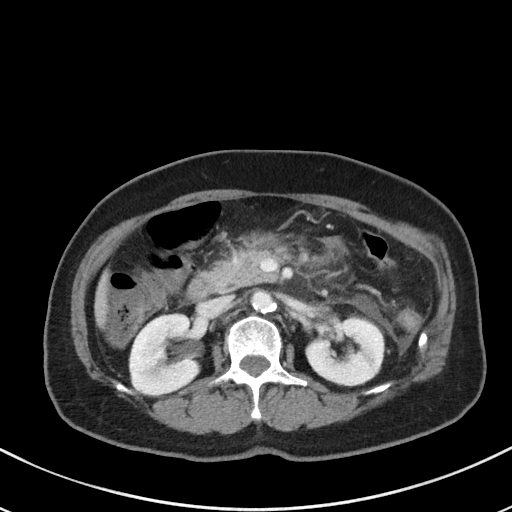
[im 66/86  soft-tissue]
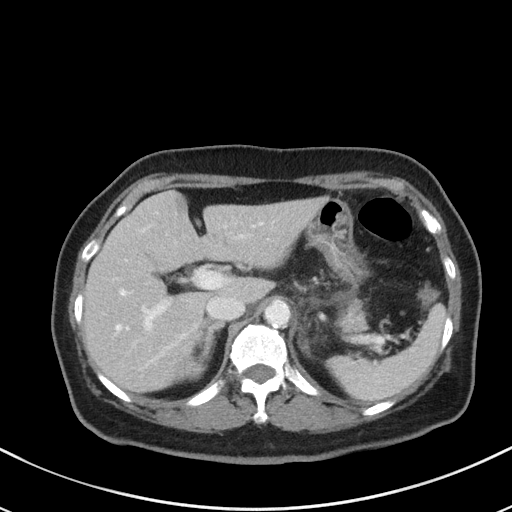
[im 66/86  bone]
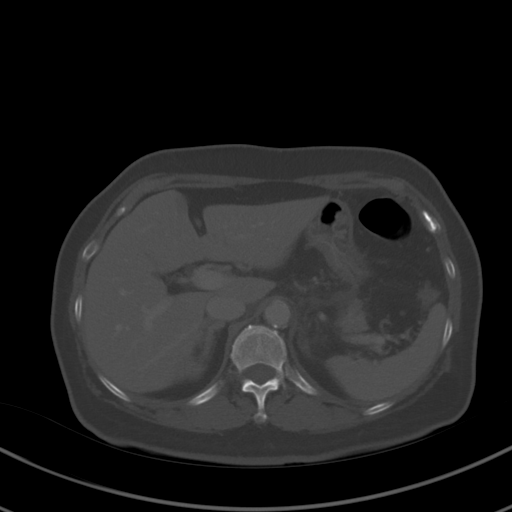
[im 71/86  soft-tissue]
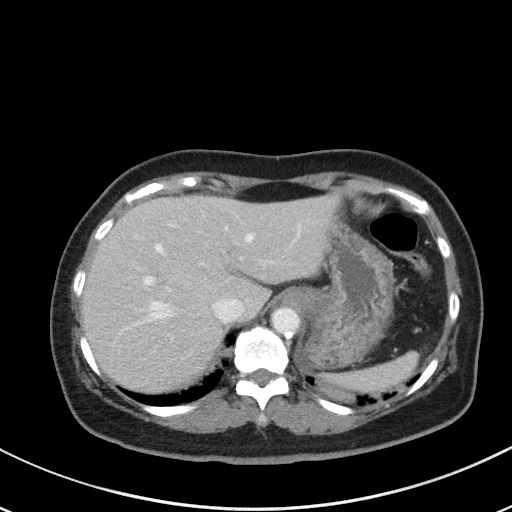
[im 81/86  soft-tissue]
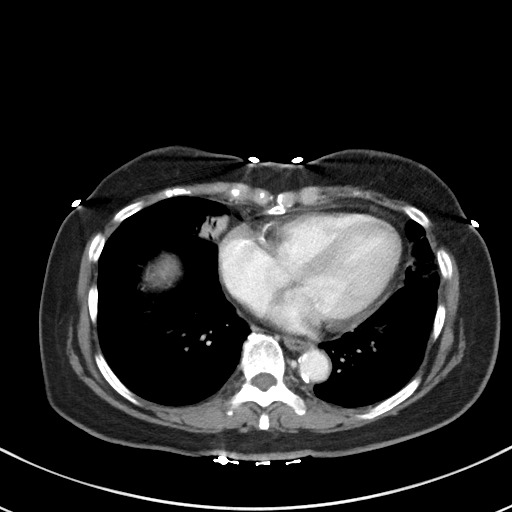

[Series 5: coronal st · coronal · 0.60mm/px · 3 of 69 slices shown]
[im 23/69  soft-tissue]
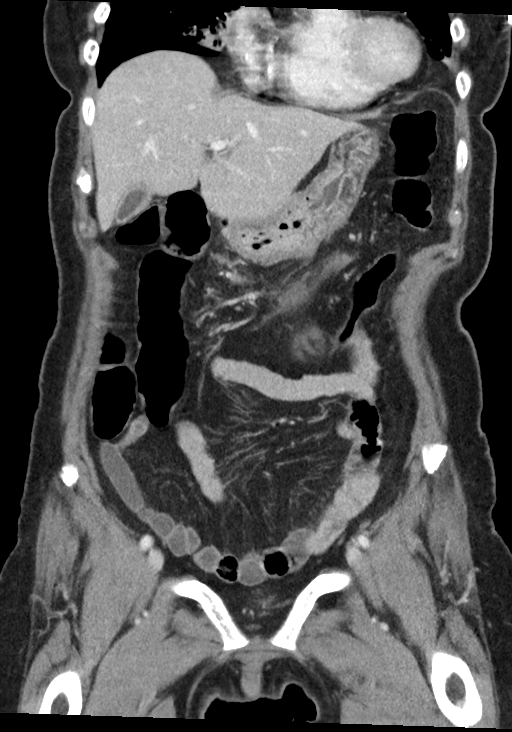
[im 31/69  soft-tissue]
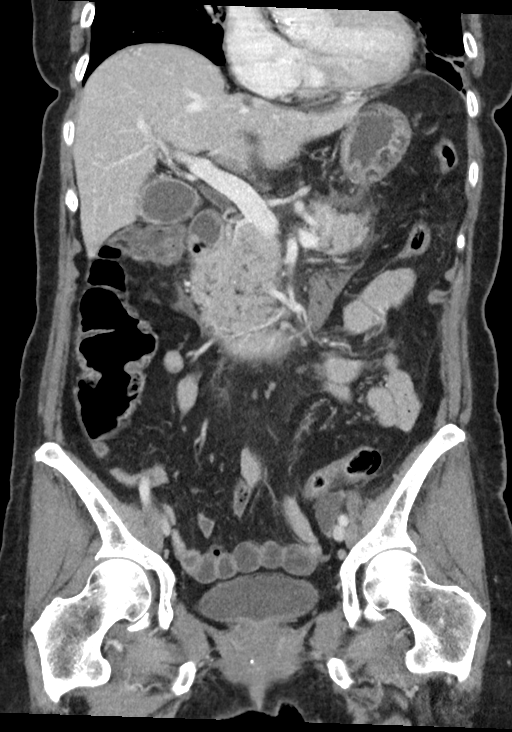
[im 38/69  soft-tissue]
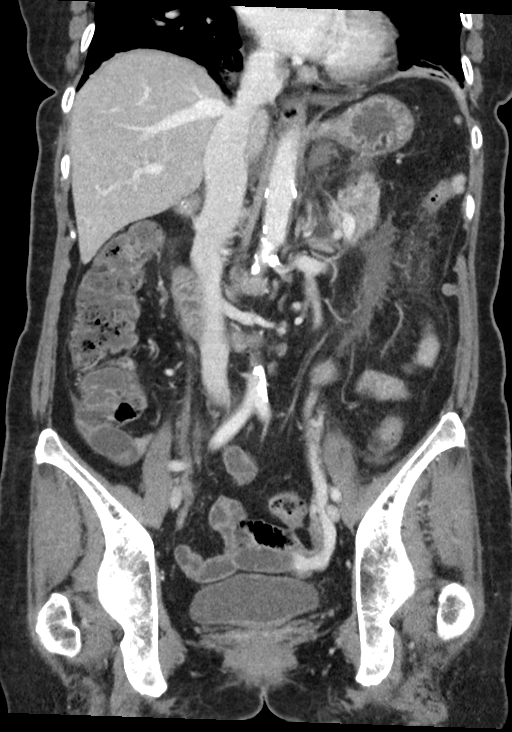

[14 of 46 positions shown; findings below may reference images not displayed]

FINDINGS: Lower chest: There is consolidation in the medial base of the right
middle lobe with hazy ground-glass opacities noted in both lower
lobes and the base of the left upper lobe lingula and smaller areas
of more dense consolidation also noted in the lower lobes, left
greater than right. Findings consistent with multifocal pneumonia.

Hepatobiliary: Liver normal in size and attenuation. 7 mm
low-attenuation at the dome of the left lobe consistent with a cyst.
Small amount of focal fat adjacent to the falciform ligament. 8 mm
lesion with peripheral enhancement at the dome of the right lobe
consistent with a hemangioma. Calcification along the dome of the
right lobe. No other liver abnormalities.

Dependent gallstones. Gallbladder wall appears slightly prominent,
but not overtly thickened. There is no adjacent inflammation. No
bile duct dilation.

Pancreas: There is peripancreatic fluid attenuation consistent with
inflammation, extends the root of the small bowel mesentery and
along the left anterior pararenal fascia. The pancreatic tail
appears mildly enlarged an area of heterogeneous attenuation, but no
defined mass or cyst.

Portal vein, splenic vein and superior mesenteric veins are widely
patent.

Spleen: Normal in size without focal abnormality.

Adrenals/Urinary Tract: 16 mm left adrenal nodule. Relative washout
is 28%, indeterminate. No right adrenal mass.

Kidneys normal in size, orientation and position. Symmetric renal
enhancement and excretion. 1 cm midpole right renal cyst. No other
masses, no stones and no hydronephrosis. Normal ureters. Bladder is
unremarkable.

Stomach/Bowel: Stomach is unremarkable. Small bowel and colon are
normal in caliber. No wall thickening. No inflammation. Normal
appendix.

Vascular/Lymphatic: Aortic atherosclerosis. No aneurysm. No enlarged
lymph nodes.

Reproductive: Uterus and bilateral adnexa are unremarkable.

Other: Trace amount of free fluid in the pelvis.

Musculoskeletal: No fracture or acute finding.  No bone lesion.
IMPRESSION: 1. Acute pancreatitis. Area of heterogeneous enlargement in the tail
of pancreas may reflect early pseudocyst formation. Small area of
necrosis is felt less likely. No other evidence suggest necrosis. No
formed pseudo cyst or evidence of an abscess. No venous thrombosis.
2. Lung base opacities consistent with multifocal infection.
Recommend assessing for [US] infection.
3. No other acute abnormalities.
4. Small gallstones.
5. Aortic atherosclerosis.

## 2018-12-31 IMAGING — CT CT ANGIO CHEST
2 of 6 series · 18 of 46 positions shown · IV contrast (APPLIED)
Comparison: None.

CLINICAL DATA: Elevated D-dimer with shortness of breath.

EXAM:
CT ANGIOGRAPHY CHEST WITH CONTRAST
TECHNIQUE: Multidetector CT imaging of the chest was performed using the
standard protocol during bolus administration of intravenous
contrast. Multiplanar CT image reconstructions and MIPs were
obtained to evaluate the vascular anatomy.
CONTRAST:  75mL OMNIPAQUE IOHEXOL 350 MG/ML SOLN

[Series 5: thins · axial · 0.56mm/px · z∈[-507,-297]mm · 15 of 232 slices shown]
[im 11/232  lung]
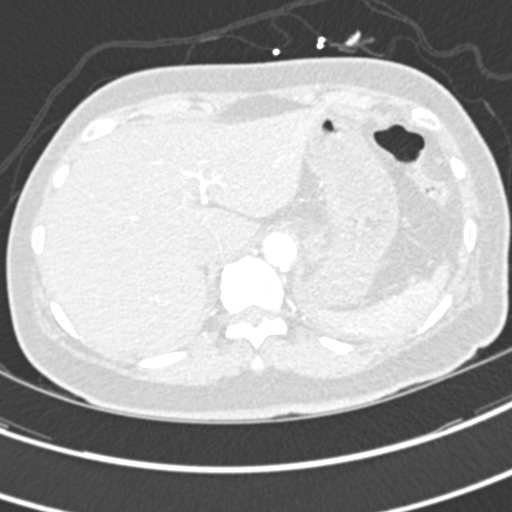
[im 31/232  soft-tissue]
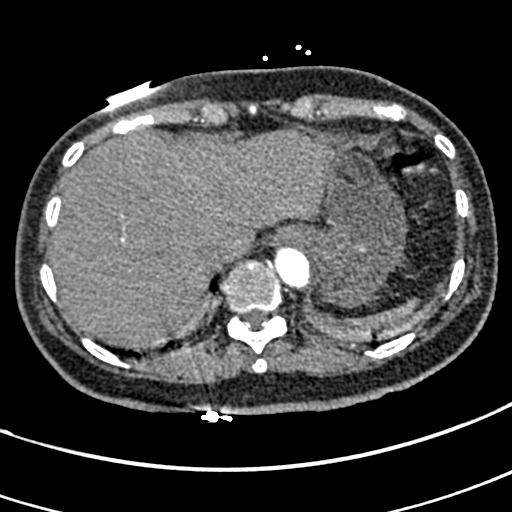
[im 41/232  lung]
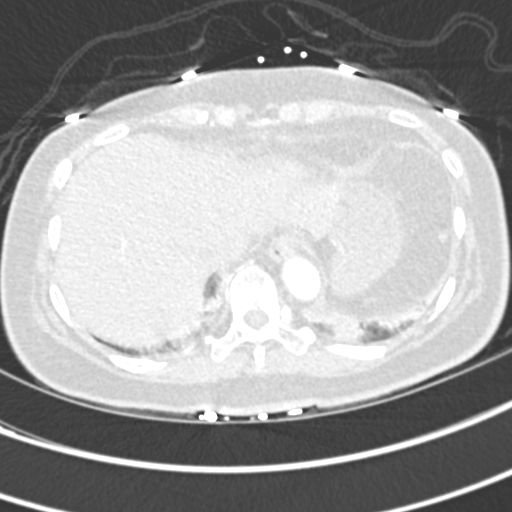
[im 61/232  soft-tissue]
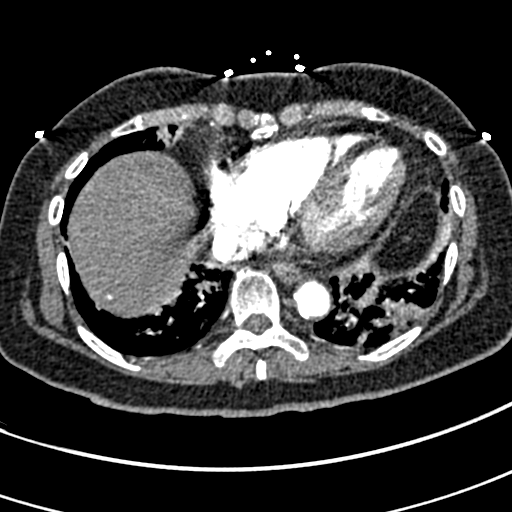
[im 71/232  lung]
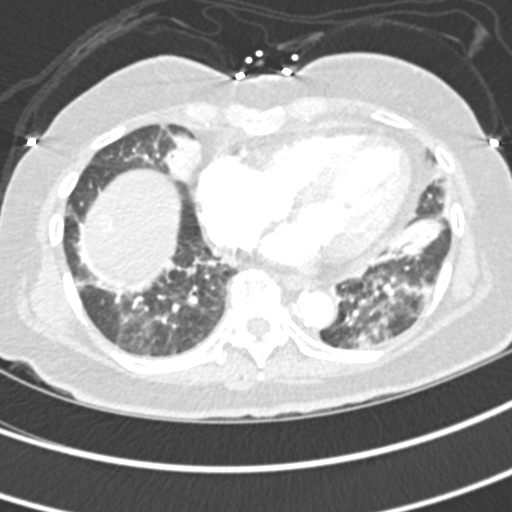
[im 91/232  soft-tissue]
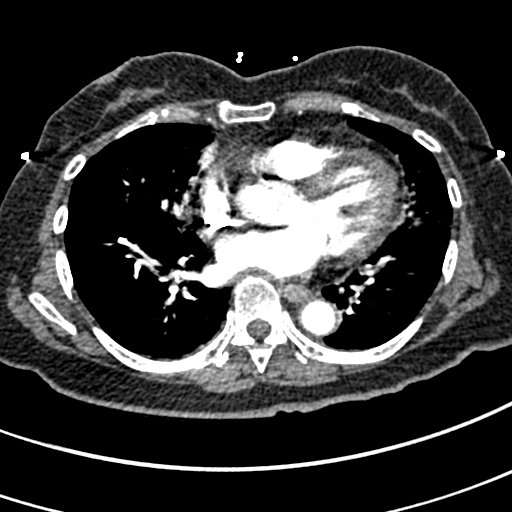
[im 101/232  lung]
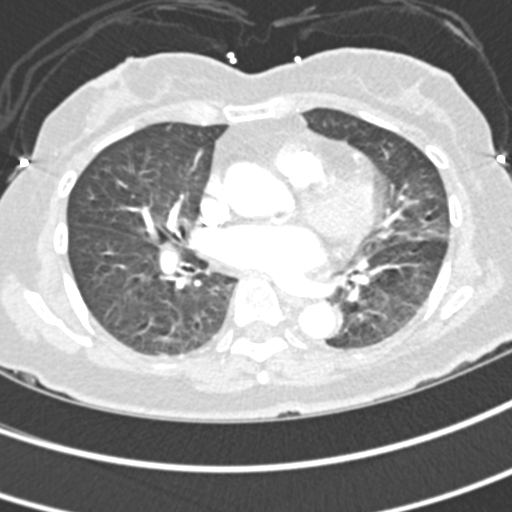
[im 121/232  soft-tissue]
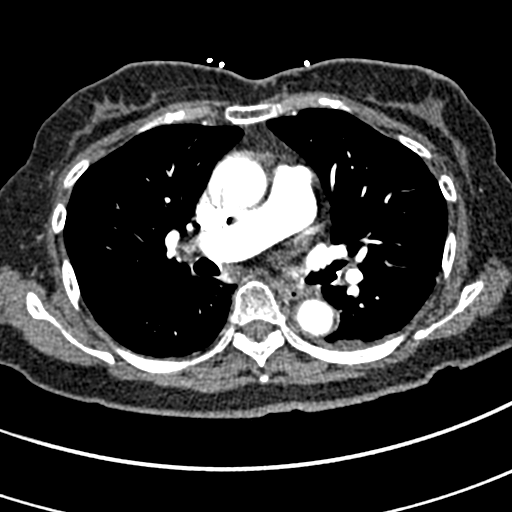
[im 131/232  lung]
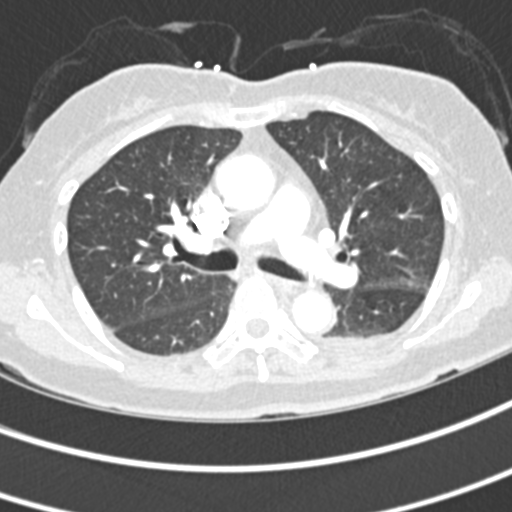
[im 141/232  soft-tissue]
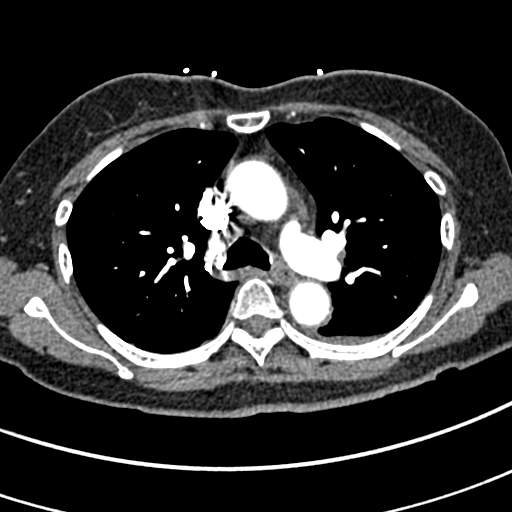
[im 161/232  lung]
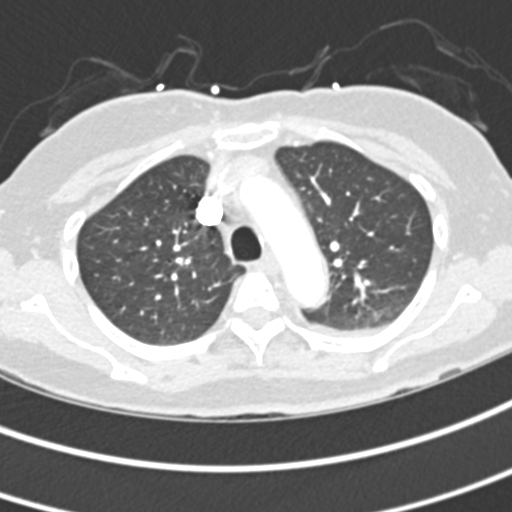
[im 171/232  soft-tissue]
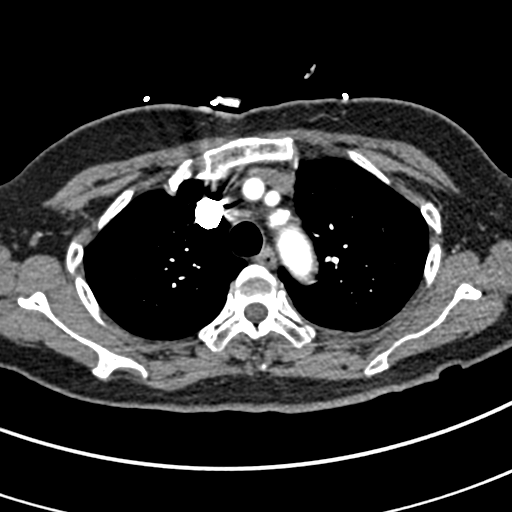
[im 191/232  lung]
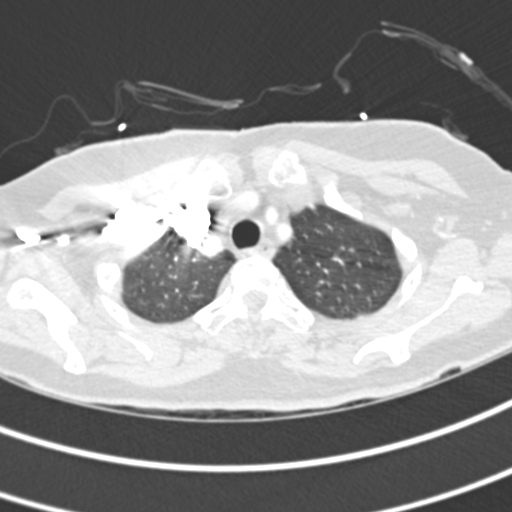
[im 201/232  soft-tissue]
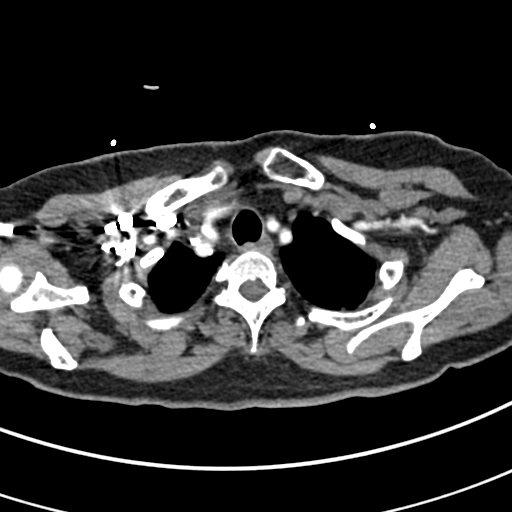
[im 221/232  lung]
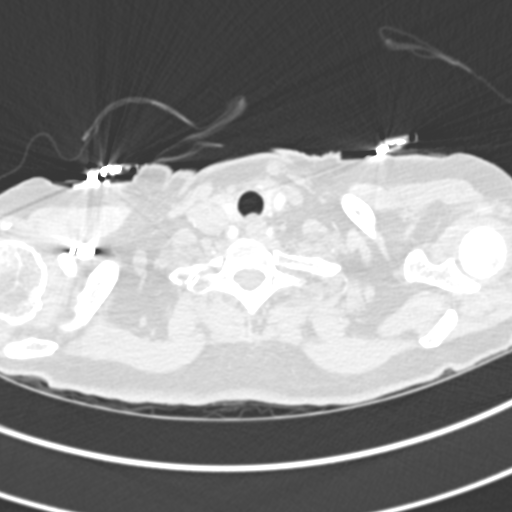

[Series 7: coronal mpr · coronal · 0.46mm/px · 3 of 65 slices shown]
[im 17/65  soft-tissue]
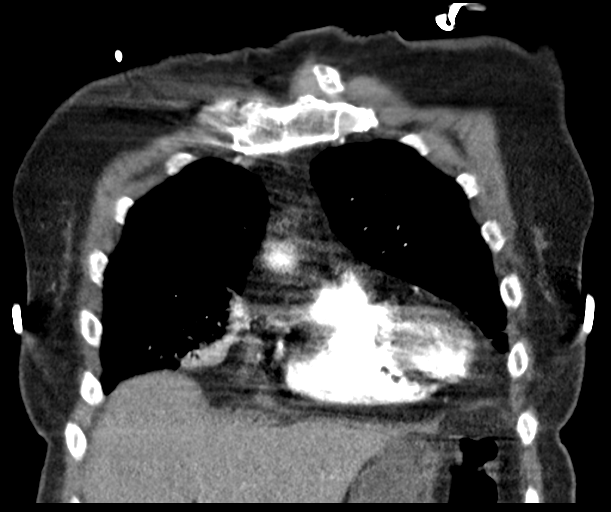
[im 33/65  soft-tissue]
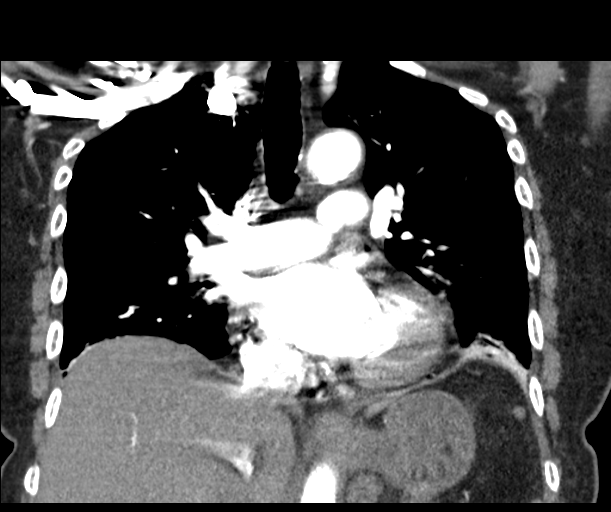
[im 49/65  soft-tissue]
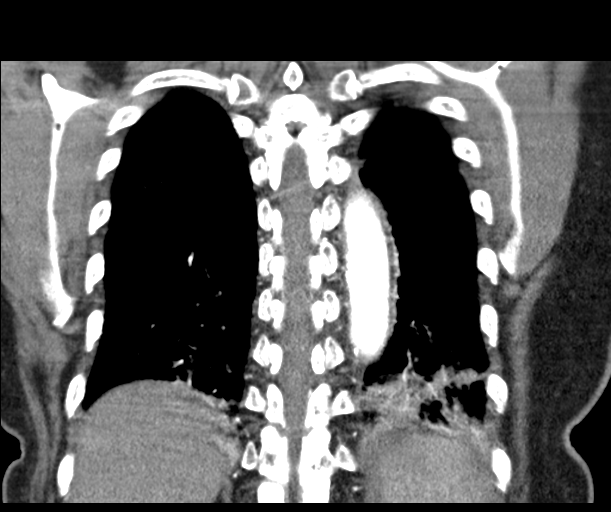

[18 of 46 positions shown; findings below may reference images not displayed]

FINDINGS: Cardiovascular: Contrast injection is sufficient to demonstrate
satisfactory opacification of the pulmonary arteries to the
segmental level. There is no pulmonary embolus. The main pulmonary
artery is within normal limits for size. There is no CT evidence of
acute right heart strain. There are atherosclerotic changes of the
visualized aorta. Heart size is mildly enlarged. There is no
significant pericardial effusion. Coronary artery calcifications are
noted.

Mediastinum/Nodes:

--No mediastinal or hilar lymphadenopathy.

--No axillary lymphadenopathy.

--No supraclavicular lymphadenopathy.

--Normal thyroid gland.

--The esophagus is unremarkable

Lungs/Pleura: There are multifocal areas of consolidation involving
the right middle lobe, lingula, and bilateral lower lobes. The
greatest area of involvement is at the left lower lobe. The trachea
is unremarkable. There is no pneumothorax. There are trace bilateral
pleural effusions.

Upper Abdomen: See separate recent CT report for complete details of
the upper abdomen.

Musculoskeletal: No chest wall abnormality. No acute or significant
osseous findings.

Review of the MIP images confirms the above findings.
IMPRESSION: 1. No evidence for pulmonary embolus.
2. Multifocal areas of consolidation as detailed above consistent
with multifocal pneumonia.
3. Trace bilateral pleural effusions.

Aortic Atherosclerosis ([KT]-[KT]).

## 2018-12-31 MED ORDER — ENOXAPARIN SODIUM 40 MG/0.4ML ~~LOC~~ SOLN
40.0000 mg | SUBCUTANEOUS | Status: DC
  Administered 2018-12-31 – 2019-01-05 (×6): 40 mg via SUBCUTANEOUS
  Filled 2018-12-31 (×8): qty 0.4

## 2018-12-31 MED ORDER — SODIUM CHLORIDE 0.45 % IV SOLN
INTRAVENOUS | Status: AC
  Administered 2018-12-31: 21:00:00 via INTRAVENOUS

## 2018-12-31 MED ORDER — ACETAMINOPHEN 325 MG PO TABS
650.0000 mg | ORAL_TABLET | Freq: Four times a day (QID) | ORAL | Status: DC | PRN
  Administered 2019-01-04 – 2019-01-06 (×2): 650 mg via ORAL
  Filled 2018-12-31 (×3): qty 2

## 2018-12-31 MED ORDER — IOHEXOL 350 MG/ML SOLN
75.0000 mL | Freq: Once | INTRAVENOUS | Status: AC | PRN
  Administered 2018-12-31: 75 mL via INTRAVENOUS
  Filled 2018-12-31: qty 75

## 2018-12-31 MED ORDER — KETOROLAC TROMETHAMINE 15 MG/ML IJ SOLN: 15.0000 mg | Freq: Four times a day (QID) | INTRAMUSCULAR | Status: DC | PRN

## 2018-12-31 MED ORDER — ACETAMINOPHEN 650 MG RE SUPP: 650.0000 mg | Freq: Four times a day (QID) | RECTAL | Status: DC | PRN

## 2018-12-31 MED ORDER — SODIUM CHLORIDE 0.9% FLUSH
3.0000 mL | Freq: Once | INTRAVENOUS | Status: AC
  Administered 2018-12-31: 3 mL via INTRAVENOUS

## 2018-12-31 MED ORDER — KETOROLAC TROMETHAMINE 15 MG/ML IJ SOLN
15.0000 mg | Freq: Four times a day (QID) | INTRAMUSCULAR | Status: DC
  Administered 2018-12-31 – 2019-01-01 (×3): 15 mg via INTRAVENOUS
  Filled 2018-12-31 (×5): qty 1

## 2018-12-31 MED ORDER — FENTANYL CITRATE (PF) 100 MCG/2ML IJ SOLN
50.0000 ug | Freq: Once | INTRAMUSCULAR | Status: AC
  Administered 2018-12-31: 17:00:00 50 ug via INTRAVENOUS
  Filled 2018-12-31: qty 2

## 2018-12-31 MED ORDER — SODIUM CHLORIDE 0.9 % IV SOLN
Freq: Once | INTRAVENOUS | Status: AC
  Administered 2018-12-31: 19:00:00 via INTRAVENOUS

## 2018-12-31 MED ORDER — ONDANSETRON HCL 4 MG PO TABS
4.0000 mg | ORAL_TABLET | Freq: Three times a day (TID) | ORAL | Status: DC | PRN
  Administered 2019-01-01: 4 mg via ORAL
  Filled 2018-12-31: qty 1

## 2018-12-31 MED ORDER — KETOROLAC TROMETHAMINE 30 MG/ML IJ SOLN
INTRAMUSCULAR | Status: AC
  Filled 2018-12-31: qty 1

## 2018-12-31 MED ORDER — IRBESARTAN 150 MG PO TABS
300.0000 mg | ORAL_TABLET | Freq: Every day | ORAL | Status: DC
  Administered 2018-12-31 – 2019-01-06 (×6): 300 mg via ORAL
  Filled 2018-12-31 (×6): qty 2

## 2018-12-31 MED ORDER — ASPIRIN EC 81 MG PO TBEC
81.0000 mg | DELAYED_RELEASE_TABLET | Freq: Every day | ORAL | Status: DC
  Administered 2018-12-31 – 2019-01-02 (×3): 81 mg via ORAL
  Filled 2018-12-31 (×3): qty 1

## 2018-12-31 MED ORDER — METOPROLOL TARTRATE 5 MG/5ML IV SOLN
5.0000 mg | INTRAVENOUS | Status: DC | PRN
  Administered 2019-01-01 (×2): 5 mg via INTRAVENOUS
  Filled 2018-12-31 (×4): qty 5

## 2018-12-31 MED ORDER — SODIUM CHLORIDE 0.9 % IV BOLUS
1000.0000 mL | Freq: Once | INTRAVENOUS | Status: AC
  Administered 2018-12-31: 1000 mL via INTRAVENOUS

## 2018-12-31 MED ORDER — IOHEXOL 300 MG/ML  SOLN
75.0000 mL | Freq: Once | INTRAMUSCULAR | Status: AC | PRN
  Administered 2018-12-31: 75 mL via INTRAVENOUS

## 2018-12-31 NOTE — ED Notes (Signed)
Patient transported to CT 

## 2018-12-31 NOTE — ED Triage Notes (Signed)
Arrives for ED evaluation from [facility] for c/o abdominal pain and N/V x 2-3 days.

## 2018-12-31 NOTE — ED Notes (Signed)
Pt presents to the ED c/o sharp lower abd pain. Pt states that it started about 3 days ago. Pt also c/o NVD.

## 2018-12-31 NOTE — ED Notes (Signed)
Secure chat notification sent to Rufina Falco, np regarding procalcitonin and dimer elevation. Awaiting return page.

## 2018-12-31 NOTE — H&P (Signed)
History and Physical    BREANE Taylor YNW:[id number] DOB: 26-Feb-1935 DOA: 12/31/2018  PCP: Madeline Manila, NP (Inactive) (Confirm with patient/family/NH records and if not entered, this has to be entered at [facility] point of entry) Patient coming from: Patient is coming from home  I have personally briefly reviewed patient's old medical records in [facility]  Chief Complaint: Severe abdominal pain with N/ v  HPI: Madeline Taylor is a 83 y.o. female with medical history significant of HT, HDL, OA. Two days ago she reports the sudden onset of severe upper abdominal pain with N/V. Due to persistent symptoms she presented to [facility] for evaluation. She left before lab results returned which did reveal an lipase of 3,922! She returns today for persistent pain with N/V and increasing weakness. She describes the pain as severe, piercing to the back. She has not been able to eat and has taken in very little fluid. No prior GI history and no knolwedge of having Gallstones.  (For level 3, the HPI must include 4+ descriptors: Location, Quality, Severity, Duration, Timing, Context, modifying factors, associated signs/symptoms and/or status of 3+ chronic problems.)  (Please avoid self-populating past medical history here) (The initial 2-3 lines should be focused and good to copy and paste in the HPI section of the daily progress note).  ED Course: In the ED she is mildly hypertensive. She continued to have pain and was given fentanyl. Lab revealed mild AKI with Cr rising from 0.89 to 101 and BUN rising from 23 to 49. CT abd/pelvis reveals cholelithiasis w/o cholecystitis, no CBD dilatation, inflammation of the pancreas with swelling of the pancreatic tail w/o cyst or mass. Scan did reveal multi-focal ground glass infiltrates suggestive of viral/covid infection. She did have a spike of BP and metoprolol 5 mg IV was ordered. [facility] was called to admit the patient for IV hydration and pain mgt.   Review of  Systems: As per HPI otherwise 10 point review of systems negative.  Unacceptable ROS statements: "10 systems reviewed," "Extensive" (without elaboration).  Acceptable ROS statements: "All others negative," "All others reviewed and are negative," and "All others unremarkable," with at LEAST ONE ROS documented Can't double dip - if using for HPI can't use for ROS  Past Medical History:  Diagnosis Date  . Essential hypertension 07/29/2016  . History of vertigo 07/29/2016  . Hyperlipidemia 07/29/2016  . Osteoarthritis of back 07/29/2016  . Osteopenia of multiple sites 07/29/2016  . Seasonal allergies 07/29/2016  . Urinary, incontinence, stress female 07/29/2016    Past Surgical History:  Procedure Laterality Date  . no past surgery     Soc Hx - married. Retired. Has 2 children, 3 grands, 2 greatgrands. Lives with her husband.   reports that she has never smoked. She has never used smokeless tobacco. She reports that she does not drink alcohol or use drugs.  Allergies  Allergen Reactions  . Lisinopril Cough  . Statins     Family History  Problem Relation Age of Onset  . Breast cancer Sister   . Cancer Sister 52       breast cancer  . Alzheimer's disease Mother   . Healthy Father    Unacceptable: Noncontributory, unremarkable, or negative. Acceptable: Family history reviewed and not pertinent (If you reviewed it)  Prior to Admission medications   Medication Sig Start Date End Date Taking? Authorizing Provider  aspirin 500 MG tablet Take 500 mg by mouth every 6 (six) hours as needed for pain.  Yes [provider]  aspirin 81 MG tablet Take 81 mg by mouth daily.   Yes [provider]  irbesartan (AVAPRO) 300 MG tablet Take 1 tablet by mouth once daily 10/25/18  Yes Madeline ManilaKennedy, Lauren Renee, NP    Physical Exam: Vitals:   12/31/18 1116 12/31/18 1118 12/31/18 1415 12/31/18 1800  BP:  (!) 159/93 (!) 181/93 (!) 184/86  Pulse:  96 83 93  Resp:  16 17   Temp:   98.3 F  (36.8 C)   TempSrc:  Oral Oral   SpO2:  97% 96% 98%  Weight: 47.2 kg     Height: 4\' 11"  (1.499 m)       Constitutional: NAD, calm, comfortable Vitals:   12/31/18 1116 12/31/18 1118 12/31/18 1415 12/31/18 1800  BP:  (!) 159/93 (!) 181/93 (!) 184/86  Pulse:  96 83 93  Resp:  16 17   Temp:   98.3 F (36.8 C)   TempSrc:  Oral Oral   SpO2:  97% 96% 98%  Weight: 47.2 kg     Height: 4\' 11"  (1.499 m)      General appearance - WNWD woman looking younger than stated age, in no acute distress Eyes: PERRL, lids and conjunctivae normal, no icterus ENMT: Mucous membranes are dry. Posterior pharynx clear of any exudate or lesions.Normal dentition.  Neck: normal, supple, no masses, no thyromegaly Respiratory: No increased WOB, no accessory muscle use. Decreased BS overall, rales at right base. Cardiovascular: Regular rate and rhythm, no murmurs / rubs / gallops. No extremity edema. 2+ pedal pulses. No carotid bruits.  Abdomen: BS + x 4, mild tenderness to percussion, worst at epigastrum and RUQ, very tender to deep palpation epigastrum and RUQ.  Musculoskeletal: no clubbing / cyanosis. No joint deformity upper and lower extremities. Good ROM, no contractures. Normal muscle tone.  Skin: no rashes, lesions, ulcers. No induration Neurologic: CN 2-12 grossly intact. Sensation intact, DTR normal. Strength 5/5 in all 4.  Psychiatric: Normal judgment and insight. Alert and oriented x 3. Normal mood.   (Anything < 9 systems with 2 bullets each down codes to level 1) (If patient refuses exam can't bill higher level) (Make sure to document decubitus ulcers present on admission -- if possible -- and whether patient has chronic indwelling catheter at time of admission)  Labs on Admission: I have personally reviewed following labs and imaging studies  CBC: Recent Labs  Lab 12/29/18 2053 12/31/18 1118  WBC 14.9* 10.9*  HGB 15.3* 15.1*  HCT 47.3* 47.0*  MCV 90.8 90.9  PLT 254 199   Basic Metabolic  Panel: Recent Labs  Lab 12/29/18 2053 12/31/18 1118  NA 143 138  K 3.0* 3.3*  CL 105 99  CO2 26 25  GLUCOSE 176* 127*  BUN 23 42*  CREATININE 0.82 1.01*  CALCIUM 9.5 8.4*   GFR: Estimated Creatinine Clearance: 29.8 mL/min (A) (by C-G formula based on SCr of 1.01 mg/dL (H)). Liver Function Tests: Recent Labs  Lab 12/29/18 2053 12/31/18 1118  AST 399* 59*  ALT 225* 95*  ALKPHOS 139* 86  BILITOT 1.6* 1.7*  PROT 7.9 8.1  ALBUMIN 4.3 4.2   Recent Labs  Lab 12/29/18 2053 12/31/18 1118  LIPASE 3,922* 322*   No results for input(s): AMMONIA in the last 168 hours. Coagulation Profile: No results for input(s): INR, PROTIME in the last 168 hours. Cardiac Enzymes: No results for input(s): CKTOTAL, CKMB, CKMBINDEX, TROPONINI in the last 168 hours. BNP (last 3 results) No  results for input(s): PROBNP in the last 8760 hours. HbA1C: No results for input(s): HGBA1C in the last 72 hours. CBG: No results for input(s): GLUCAP in the last 168 hours. Lipid Profile: No results for input(s): CHOL, HDL, LDLCALC, TRIG, CHOLHDL, LDLDIRECT in the last 72 hours. Thyroid Function Tests: No results for input(s): TSH, T4TOTAL, FREET4, T3FREE, THYROIDAB in the last 72 hours. Anemia Panel: No results for input(s): VITAMINB12, FOLATE, FERRITIN, TIBC, IRON, RETICCTPCT in the last 72 hours. Urine analysis:    Component Value Date/Time   COLORURINE YELLOW (A) 12/31/2018 1749   APPEARANCEUR CLEAR (A) 12/31/2018 1749   LABSPEC 1.041 (H) 12/31/2018 1749   PHURINE 6.0 12/31/2018 1749   GLUCOSEU NEGATIVE 12/31/2018 1749   HGBUR SMALL (A) 12/31/2018 1749   BILIRUBINUR NEGATIVE 12/31/2018 1749   BILIRUBINUR neg 10/12/2016 1328   KETONESUR 20 (A) 12/31/2018 1749   PROTEINUR 30 (A) 12/31/2018 1749   UROBILINOGEN 0.2 10/12/2016 1328   NITRITE NEGATIVE 12/31/2018 1749   LEUKOCYTESUR NEGATIVE 12/31/2018 1749    Radiological Exams on Admission: CT ABDOMEN PELVIS W CONTRAST  Result Date:  12/31/2018 CLINICAL DATA:  Hervey Ard lower abdominal pain beginning approximately 3 days ago, also with nausea, vomiting and diarrhea. Elevated lipase. EXAM: CT ABDOMEN AND PELVIS WITH CONTRAST TECHNIQUE: Multidetector CT imaging of the abdomen and pelvis was performed using the standard protocol following bolus administration of intravenous contrast. CONTRAST:  3mL OMNIPAQUE IOHEXOL 300 MG/ML  SOLN COMPARISON:  None. FINDINGS: Lower chest: There is consolidation in the medial base of the right middle lobe with hazy ground-glass opacities noted in both lower lobes and the base of the left upper lobe lingula and smaller areas of more dense consolidation also noted in the lower lobes, left greater than right. Findings consistent with multifocal pneumonia. Hepatobiliary: Liver normal in size and attenuation. 7 mm low-attenuation at the dome of the left lobe consistent with a cyst. Small amount of focal fat adjacent to the falciform ligament. 8 mm lesion with peripheral enhancement at the dome of the right lobe consistent with a hemangioma. Calcification along the dome of the right lobe. No other liver abnormalities. Dependent gallstones. Gallbladder wall appears slightly prominent, but not overtly thickened. There is no adjacent inflammation. No bile duct dilation. Pancreas: There is peripancreatic fluid attenuation consistent with inflammation, extends the root of the small bowel mesentery and along the left anterior pararenal fascia. The pancreatic tail appears mildly enlarged an area of heterogeneous attenuation, but no defined mass or cyst. Portal vein, splenic vein and superior mesenteric veins are widely patent. Spleen: Normal in size without focal abnormality. Adrenals/Urinary Tract: 16 mm left adrenal nodule. Relative washout is 28%, indeterminate. No right adrenal mass. Kidneys normal in size, orientation and position. Symmetric renal enhancement and excretion. 1 cm midpole right renal cyst. No other masses, no  stones and no hydronephrosis. Normal ureters. Bladder is unremarkable. Stomach/Bowel: Stomach is unremarkable. Small bowel and colon are normal in caliber. No wall thickening. No inflammation. Normal appendix. Vascular/Lymphatic: Aortic atherosclerosis. No aneurysm. No enlarged lymph nodes. Reproductive: Uterus and bilateral adnexa are unremarkable. Other: Trace amount of free fluid in the pelvis. Musculoskeletal: No fracture or acute finding.  No bone lesion. IMPRESSION: 1. Acute pancreatitis. Area of heterogeneous enlargement in the tail of pancreas may reflect early pseudocyst formation. Small area of necrosis is felt less likely. No other evidence suggest necrosis. No formed pseudo cyst or evidence of an abscess. No venous thrombosis. 2. Lung base opacities consistent with multifocal infection. Recommend assessing  for COVID-19 infection. 3. No other acute abnormalities. 4. Small gallstones. 5. Aortic atherosclerosis. Electronically Signed   By: Amie Portland M.D.   On: 12/31/2018 17:49    EKG: Independently reviewed. NSR, T wave abnl V3-V4  Assessment/Plan Active Problems:   Pancreatitis   Gallstone   Multifocal pneumonia   Essential hypertension  (please populate well all problems here in Problem List. (For example, if patient is on BP meds at home and you resume or decide to hold them, it is a problem that needs to be her. Same for CAD, COPD, HLD and so on)   1. Pancreatitis - most likely passed a gallstone with cholelithiasis on CT but no CBD dilatation. Inflammed pancrease. Lipase from 3,922 to 300. Dehydration by report and elevated BUN/Cr. Plan Observation admit ` IVF D5 1/2NS at 75 cc/hr x 12 hrs  Ketorolac 15 mg IV q6 for pain control  Zofran for nausea  Clear liquid diet  F/U lab: CMet in AM nad lipase.  2. Multi-focal ground glass infilrates - patient and spouse report that they have not been out except for groceries, no Covid contacts. No Covid testing. Suspicious CT  lungs Plan Covid testing per facility policy  Standard precautions  F/u CXR in AM]  Inflammatory markers: ferritin, CRP, procalcitonin, D-dimer  3. HTN - elevated BP in ED. Patiend has missed meds for several days plus pain. Plan Continue home meds ` Metoprolol 5 mg q 4 prn SBP > 180  Examiner wore full PPE: double gloved, gown, N-95 mask, eye protection.   DVT prophylaxis: lovenox (Lovenox/Heparin/SCD's/anticoagulated/None (if comfort care) Code Status: full code (Full/Partial (specify details) Family Communication:  Spouse present during exam (Specify name, relationship. Do not write "discussed with patient". Specify tel # if discussed over the phone) Disposition Plan: home when stable (specify when and where you expect patient to be discharged) Consults called: none (with names) Admission status: obs (inpatient / obs / tele / medical floor / SDU)   Illene Regulus MD [facility] Pager [phone]  If 7PM-7AM, please contact night-coverage [email or web address] Password [facility]  12/31/2018, 7:39 PM

## 2018-12-31 NOTE — ED Notes (Signed)
Madeline Falco, np acknowledged secure chat message regarding elevated dimer and procalcitonin. No new orders received.

## 2018-12-31 NOTE — ED Provider Notes (Signed)
[facility] Emergency Department Provider Note  ____________________________________________   I have reviewed the triage vital signs and the nursing notes.   HISTORY  Chief Complaint Abdominal pain  History limited by: Not Limited   HPI Madeline Taylor is a 83 y.o. female who presents to the emergency department today because of concerns for abdominal pain.  Is located the mid abdomen.  It started 5 days ago.  It has been constant.  It is severe.  Patient has had associated nausea and vomiting.  Has tried drinking small amounts without any significant relief.  Did try some Pepto-Bismol which helped slightly with some of the nausea.  Patient did come to the emergency department 2 days ago however left after blood was drawn but prior to blood test results.  Patient denies any fevers.  Denies similar symptoms in the past.  Denies any abdominal surgeries.  Records reviewed. Per medical record review patient has a history of coming to the emergency department 2 days ago for similar symptoms. It appears she left prior to blood work returning. Blood work at that time did show significant lipase elevation.  Past Medical History:  Diagnosis Date  . Essential hypertension 07/29/2016  . History of vertigo 07/29/2016  . Hyperlipidemia 07/29/2016  . Osteoarthritis of back 07/29/2016  . Osteopenia of multiple sites 07/29/2016  . Seasonal allergies 07/29/2016  . Urinary, incontinence, stress female 07/29/2016    Patient Active Problem List   Diagnosis Date Noted  . Osteoarthritis of multiple joints 05/18/2018  . OAB (overactive bladder) 10/16/2016  . Hyperlipidemia 07/29/2016  . Essential hypertension 07/29/2016  . Osteoporosis of femur without pathological fracture 07/29/2016  . Seasonal allergies 07/29/2016  . History of vertigo 07/29/2016  . Urinary, incontinence, stress female 07/29/2016  . Osteoarthritis of back 07/29/2016    Past Surgical History:  Procedure  Laterality Date  . no past surgery      Prior to Admission medications   Medication Sig Start Date End Date Taking? Authorizing Provider  aspirin 500 MG tablet Take 500 mg by mouth every 6 (six) hours as needed for pain.    [provider]  aspirin 81 MG tablet Take 81 mg by mouth daily.    [provider]  irbesartan (AVAPRO) 300 MG tablet Take 1 tablet by mouth once daily 10/25/18   Galen Manila, NP    Allergies Lisinopril and Statins  Family History  Problem Relation Age of Onset  . Breast cancer Sister   . Cancer Sister 46       breast cancer  . Alzheimer's disease Mother   . Healthy Father     Social History Social History   Tobacco Use  . Smoking status: Never Smoker  . Smokeless tobacco: Never Used  Substance Use Topics  . Alcohol use: No  . Drug use: No    Review of Systems Constitutional: No fever/chills Eyes: No visual changes. ENT: No sore throat. Cardiovascular: Denies chest pain. Respiratory: Denies shortness of breath. Gastrointestinal: Positive for abdominal pain, nausea and vomiting.  Genitourinary: Negative for dysuria. Musculoskeletal: Negative for back pain. Skin: Negative for rash. Neurological: Negative for headaches, focal weakness or numbness.  ____________________________________________   PHYSICAL EXAM:  VITAL SIGNS: ED Triage Vitals  Enc Vitals Group     BP 12/31/18 1118 (!) 159/93     Pulse Rate 12/31/18 1118 96     Resp 12/31/18 1118 16     Temp 12/31/18 1415 98.3 F (36.8 C)  Temp Source 12/31/18 1118 Oral     SpO2 12/31/18 1118 97 %     Weight 12/31/18 1116 104 lb 0.9 oz (47.2 kg)     Height 12/31/18 1116 4\' 11"  (1.499 m)     Head Circumference --      Peak Flow --      Pain Score 12/31/18 1116 8   Constitutional: Alert and oriented.  Eyes: Conjunctivae are normal.  ENT      Head: Normocephalic and atraumatic.      Nose: No congestion/rhinnorhea.      Mouth/Throat: Mucous membranes are  moist.      Neck: No stridor. Hematological/Lymphatic/Immunilogical: No cervical lymphadenopathy. Cardiovascular: Normal rate, regular rhythm.  No murmurs, rubs, or gallops.  Respiratory: Normal respiratory effort without tachypnea nor retractions. Breath sounds are clear and equal bilaterally. No wheezes/rales/rhonchi. Gastrointestinal: Soft. Diffusely tender to palpation. Genitourinary: Deferred Musculoskeletal: Normal range of motion in all extremities. No lower extremity edema. Neurologic:  Normal speech and language. No gross focal neurologic deficits are appreciated.  Skin:  Skin is warm, dry and intact. No rash noted. Psychiatric: Mood and affect are normal. Speech and behavior are normal. Patient exhibits appropriate insight and judgment.  ____________________________________________    LABS (pertinent positives/negatives)  Lipase 322 CBC wbc 10.9, hgb 15.1, plt 199 CMP na 138, k 3.3, glu 127, cr 1.01 UA rbc and wbc 0-5, ketones 20 ____________________________________________   EKG  None  ____________________________________________    RADIOLOGY  CT abd/pel Acute pancreatitis with concern for pseudocyst. Lower lung disease concerning for multifocal infection  ____________________________________________   PROCEDURES  Procedures  ____________________________________________   INITIAL IMPRESSION / ASSESSMENT AND PLAN / ED COURSE  Pertinent labs & imaging results that were available during my care of the patient were reviewed by me and considered in my medical decision making (see chart for details).   Patient presented to the emergency department today with concerns for continued abdominal pain.  That has been present for the past 5 days.  On exam she did have some diffuse abdominal discomfort.  Lipase obtained 2 days ago was significantly elevated and today it continues to be elevated although improved.  Given the length of symptoms continued pain I do think  patient would benefit from inpatient admission.  I discussed this with patient and family.  Additionally CT scan was concerning for possible Covid.  Patient without any fevers, shortness of breath or cough.  ____________________________________________   FINAL CLINICAL IMPRESSION(S) / ED DIAGNOSES  Pancreatitis  Note: This dictation was prepared with Dragon dictation. Any transcriptional errors that result from this process are unintentional     Nance Pear, MD 12/31/18 1924

## 2019-01-01 ENCOUNTER — Observation Stay: Payer: Medicare Other

## 2019-01-01 ENCOUNTER — Other Ambulatory Visit: Payer: Self-pay

## 2019-01-01 DIAGNOSIS — E440 Moderate protein-calorie malnutrition: Secondary | ICD-10-CM | POA: Diagnosis present

## 2019-01-01 DIAGNOSIS — Z888 Allergy status to other drugs, medicaments and biological substances status: Secondary | ICD-10-CM | POA: Diagnosis not present

## 2019-01-01 DIAGNOSIS — K851 Biliary acute pancreatitis without necrosis or infection: Secondary | ICD-10-CM | POA: Diagnosis present

## 2019-01-01 DIAGNOSIS — M1990 Unspecified osteoarthritis, unspecified site: Secondary | ICD-10-CM | POA: Diagnosis present

## 2019-01-01 DIAGNOSIS — Z6821 Body mass index (BMI) 21.0-21.9, adult: Secondary | ICD-10-CM | POA: Diagnosis not present

## 2019-01-01 DIAGNOSIS — K8050 Calculus of bile duct without cholangitis or cholecystitis without obstruction: Secondary | ICD-10-CM | POA: Diagnosis not present

## 2019-01-01 DIAGNOSIS — E860 Dehydration: Secondary | ICD-10-CM | POA: Diagnosis present

## 2019-01-01 DIAGNOSIS — N179 Acute kidney failure, unspecified: Secondary | ICD-10-CM | POA: Diagnosis present

## 2019-01-01 DIAGNOSIS — E785 Hyperlipidemia, unspecified: Secondary | ICD-10-CM | POA: Diagnosis present

## 2019-01-01 DIAGNOSIS — M8580 Other specified disorders of bone density and structure, unspecified site: Secondary | ICD-10-CM | POA: Diagnosis present

## 2019-01-01 DIAGNOSIS — J189 Pneumonia, unspecified organism: Secondary | ICD-10-CM | POA: Diagnosis present

## 2019-01-01 DIAGNOSIS — Z7982 Long term (current) use of aspirin: Secondary | ICD-10-CM | POA: Diagnosis not present

## 2019-01-01 DIAGNOSIS — Z803 Family history of malignant neoplasm of breast: Secondary | ICD-10-CM | POA: Diagnosis not present

## 2019-01-01 DIAGNOSIS — E876 Hypokalemia: Secondary | ICD-10-CM | POA: Diagnosis not present

## 2019-01-01 DIAGNOSIS — I10 Essential (primary) hypertension: Secondary | ICD-10-CM | POA: Diagnosis present

## 2019-01-01 DIAGNOSIS — R112 Nausea with vomiting, unspecified: Secondary | ICD-10-CM | POA: Diagnosis not present

## 2019-01-01 DIAGNOSIS — Z20828 Contact with and (suspected) exposure to other viral communicable diseases: Secondary | ICD-10-CM | POA: Diagnosis present

## 2019-01-01 LAB — COMPREHENSIVE METABOLIC PANEL
ALT: 57 U/L — ABNORMAL HIGH (ref 0–44)
AST: 48 U/L — ABNORMAL HIGH (ref 15–41)
Albumin: 3.1 g/dL — ABNORMAL LOW (ref 3.5–5.0)
Alkaline Phosphatase: 62 U/L (ref 38–126)
Anion gap: 10 (ref 5–15)
BUN: 28 mg/dL — ABNORMAL HIGH (ref 8–23)
CO2: 22 mmol/L (ref 22–32)
Calcium: 7 mg/dL — ABNORMAL LOW (ref 8.9–10.3)
Chloride: 107 mmol/L (ref 98–111)
Creatinine, Ser: 0.62 mg/dL (ref 0.44–1.00)
GFR calc Af Amer: >60 mL/min (ref >60)
GFR calc non Af Amer: >60 mL/min (ref >60)
Glucose, Bld: 95 mg/dL (ref 70–99)
Potassium: 3.8 mmol/L (ref 3.5–5.1)
Sodium: 139 mmol/L (ref 135–145)
Total Bilirubin: 2.2 mg/dL — ABNORMAL HIGH (ref 0.3–1.2)
Total Protein: 6.2 g/dL — ABNORMAL LOW (ref 6.5–8.1)

## 2019-01-01 LAB — LIPASE, BLOOD: Lipase: 92 U/L — ABNORMAL HIGH (ref 11–51)

## 2019-01-01 LAB — SARS CORONAVIRUS 2 (TAT 6-24 HRS)
SARS Coronavirus 2: NEGATIVE
SARS Coronavirus 2: NEGATIVE

## 2019-01-01 LAB — C-REACTIVE PROTEIN: CRP: 25.8 mg/dL — ABNORMAL HIGH (ref <1.0)

## 2019-01-01 IMAGING — DX DG CHEST 1V PORT
1 series · 1 of 1 positions shown · non-contrast
Comparison: CT PE study from the same day

CLINICAL DATA: Bilateral pulmonary infiltrates

EXAM:
PORTABLE CHEST 1 VIEW

[chest ap]
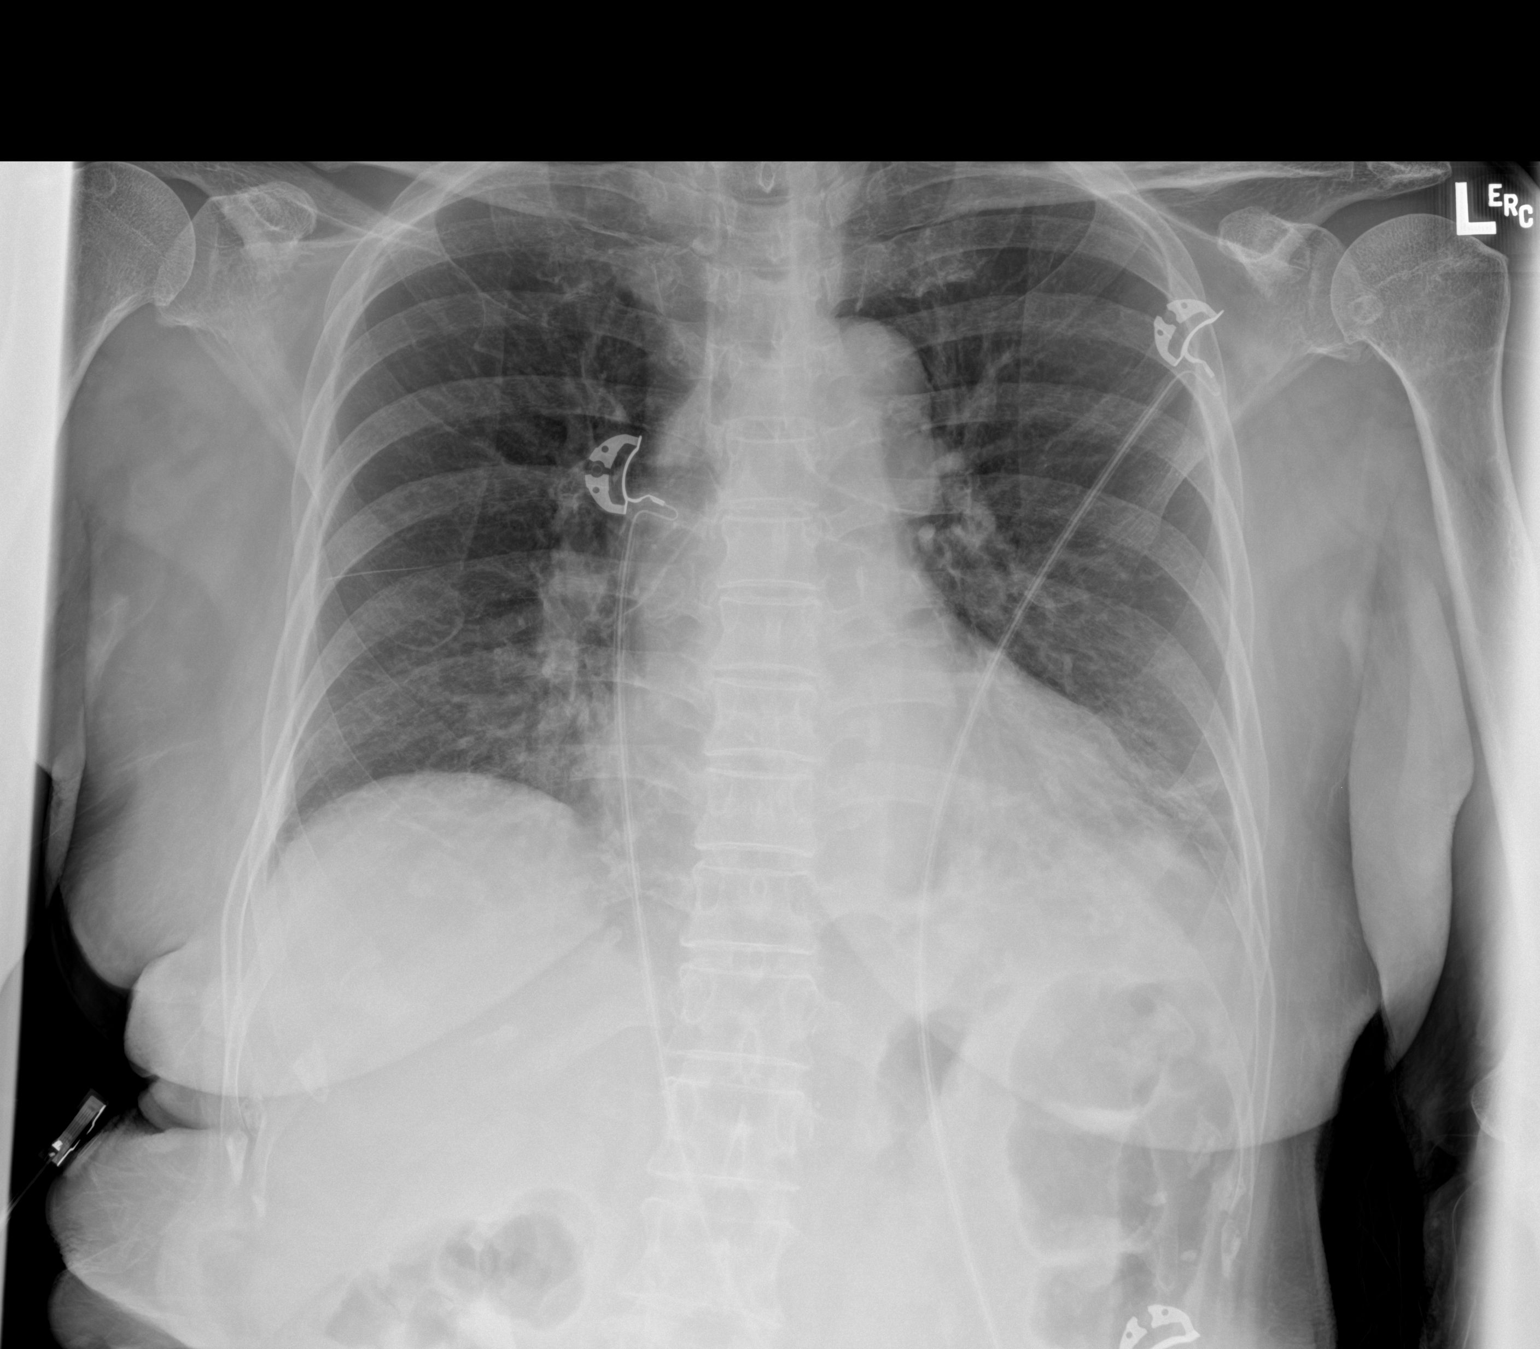

[1 of 1 positions shown; findings below may reference images not displayed]

FINDINGS: Bilateral pulmonary opacities are again noted. There is no
pneumothorax. No large pleural effusion. The heart size is enlarged.
There is no acute osseous abnormality.
IMPRESSION: Bilateral pulmonary opacities as seen on recent CT of the chest,
consistent with multifocal pneumonia.

## 2019-01-01 MED ORDER — KETOROLAC TROMETHAMINE 30 MG/ML IJ SOLN
INTRAMUSCULAR | Status: AC
  Filled 2019-01-01: qty 1

## 2019-01-01 MED ORDER — ONDANSETRON HCL 4 MG/2ML IJ SOLN
4.0000 mg | Freq: Four times a day (QID) | INTRAMUSCULAR | Status: DC | PRN
  Administered 2019-01-01 – 2019-01-04 (×2): 4 mg via INTRAVENOUS
  Filled 2019-01-01 (×2): qty 2

## 2019-01-01 MED ORDER — HYDRALAZINE HCL 20 MG/ML IJ SOLN: 10.0000 mg | Freq: Four times a day (QID) | INTRAMUSCULAR | Status: DC | PRN

## 2019-01-01 MED ORDER — NITROGLYCERIN 2 % TD OINT
1.0000 [in_us] | TOPICAL_OINTMENT | Freq: Four times a day (QID) | TRANSDERMAL | Status: DC
  Administered 2019-01-01 (×2): 1 [in_us] via TOPICAL
  Filled 2019-01-01: qty 1

## 2019-01-01 MED ORDER — NITROGLYCERIN 2 % TD OINT
1.0000 [in_us] | TOPICAL_OINTMENT | Freq: Three times a day (TID) | TRANSDERMAL | Status: DC
  Administered 2019-01-01 – 2019-01-06 (×13): 1 [in_us] via TOPICAL
  Filled 2019-01-01 (×20): qty 1

## 2019-01-01 MED ORDER — HYDRALAZINE HCL 20 MG/ML IJ SOLN
5.0000 mg | Freq: Four times a day (QID) | INTRAMUSCULAR | Status: DC | PRN
  Administered 2019-01-01 – 2019-01-03 (×2): 5 mg via INTRAVENOUS
  Filled 2019-01-01 (×2): qty 1
  Filled 2019-01-01 (×2): qty 0.25

## 2019-01-01 MED ORDER — NITROGLYCERIN 2 % TD OINT: 1.0000 [in_us] | TOPICAL_OINTMENT | Freq: Four times a day (QID) | TRANSDERMAL | Status: DC

## 2019-01-01 MED ORDER — PROMETHAZINE HCL 25 MG/ML IJ SOLN: 12.5000 mg | Freq: Four times a day (QID) | INTRAMUSCULAR | Status: DC | PRN

## 2019-01-01 MED ORDER — METOPROLOL TARTRATE 25 MG PO TABS
25.0000 mg | ORAL_TABLET | Freq: Two times a day (BID) | ORAL | Status: DC
  Administered 2019-01-01 – 2019-01-03 (×4): 25 mg via ORAL
  Filled 2019-01-01 (×2): qty 1
  Filled 2019-01-01: qty 0.5
  Filled 2019-01-01 (×2): qty 1

## 2019-01-01 MED ORDER — METOPROLOL TARTRATE 25 MG PO TABS
12.5000 mg | ORAL_TABLET | Freq: Two times a day (BID) | ORAL | Status: DC
  Administered 2019-01-01: 12.5 mg via ORAL
  Filled 2019-01-01: qty 0.5
  Filled 2019-01-01: qty 1

## 2019-01-01 MED ORDER — MORPHINE SULFATE (PF) 2 MG/ML IV SOLN
2.0000 mg | INTRAVENOUS | Status: DC | PRN
  Administered 2019-01-01 – 2019-01-04 (×6): 2 mg via INTRAVENOUS
  Filled 2019-01-01 (×6): qty 1

## 2019-01-01 MED ORDER — POLYETHYLENE GLYCOL 3350 17 G PO PACK
17.0000 g | PACK | Freq: Every day | ORAL | Status: DC
  Administered 2019-01-01 – 2019-01-06 (×4): 17 g via ORAL
  Filled 2019-01-01 (×7): qty 1

## 2019-01-01 MED ORDER — LABETALOL HCL 5 MG/ML IV SOLN
5.0000 mg | Freq: Four times a day (QID) | INTRAVENOUS | Status: DC | PRN
  Administered 2019-01-02: 5 mg via INTRAVENOUS
  Filled 2019-01-01: qty 4

## 2019-01-01 NOTE — ED Notes (Signed)
Rufina Falco notified regarding continued htn after lopressor administration.

## 2019-01-01 NOTE — ED Notes (Signed)
Pt ambulated to toilet. Well tolerated. 

## 2019-01-01 NOTE — ED Notes (Signed)
ED TO INPATIENT HANDOFF REPORT  ED Nurse Name and Phone #: [phone]   S Name/Age/Gender Madeline Taylor 83 y.o. female Room/Bed: ED31A/ED31A  Code Status   Code Status: Full Code  Home/SNF/Other Home Patient oriented to: self, place, time and situation Is this baseline? Yes   Triage Complete: Triage complete  Chief Complaint Pancreatitis [K85.90]  Triage Note Arrives for ED evaluation from [facility] for c/o abdominal pain and N/V x 2-3 days.    Allergies Allergies  Allergen Reactions  . Lisinopril Cough  . Statins     Level of Care/Admitting Diagnosis ED Disposition    ED Disposition Condition Comment   Admit  Hospital Area: [facility] [[id number]]  Level of Care: Med-Surg [16]  Covid Evaluation: Confirmed COVID Negative  Diagnosis: Pancreatitis [147829]  Admitting Physician: Jacques Navy [[id number]]  Attending Physician: Illene Regulus E [[id number]]  PT Class (Do Not Modify): Observation [104]  PT Acc Code (Do Not Modify): Observation [10022]       B Medical/Surgery History Past Medical History:  Diagnosis Date  . Essential hypertension 07/29/2016  . History of vertigo 07/29/2016  . Hyperlipidemia 07/29/2016  . Osteoarthritis of back 07/29/2016  . Osteopenia of multiple sites 07/29/2016  . Seasonal allergies 07/29/2016  . Urinary, incontinence, stress female 07/29/2016   Past Surgical History:  Procedure Laterality Date  . no past surgery       A IV Location/Drains/Wounds Patient Lines/Drains/Airways Status   Active Line/Drains/Airways    Name:   Placement date:   Placement time:   Site:   Days:   Peripheral IV 12/31/18 Right Antecubital   12/31/18    1652    Antecubital   1          Intake/Output Last 24 hours  Intake/Output Summary (Last 24 hours) at 01/01/2019 5621 Last data filed at 01/01/2019 3086 Gross per 24 hour  Intake 2000 ml  Output --  Net 2000 ml    Labs/Imaging Results for orders placed or performed during  the hospital encounter of 12/31/18 (from the past 48 hour(s))  Lipase, blood     Status: Abnormal   Collection Time: 12/31/18 11:18 AM  Result Value Ref Range   Lipase 322 (H) 11 - 51 U/L    Comment: Performed at [facility], [address] [place], [place] [address]  Comprehensive metabolic panel     Status: Abnormal   Collection Time: 12/31/18 11:18 AM  Result Value Ref Range   Sodium 138 135 - 145 mmol/L   Potassium 3.3 (L) 3.5 - 5.1 mmol/L   Chloride 99 98 - 111 mmol/L   CO2 25 22 - 32 mmol/L   Glucose, Bld 127 (H) 70 - 99 mg/dL   BUN 42 (H) 8 - 23 mg/dL   Creatinine, Ser 9.62 (H) 0.44 - 1.00 mg/dL   Calcium 8.4 (L) 8.9 - 10.3 mg/dL   Total Protein 8.1 6.5 - 8.1 g/dL   Albumin 4.2 3.5 - 5.0 g/dL   AST 59 (H) 15 - 41 U/L   ALT 95 (H) 0 - 44 U/L   Alkaline Phosphatase 86 38 - 126 U/L   Total Bilirubin 1.7 (H) 0.3 - 1.2 mg/dL   GFR calc non Af Amer 52 (L) >60 mL/min   GFR calc Af Amer >60 >60 mL/min   Anion gap 14 5 - 15    Comment: Performed at [facility], [address] [place], [place] [address]  CBC  Status: Abnormal   Collection Time: 12/31/18 11:18 AM  Result Value Ref Range   WBC 10.9 (H) 4.0 - 10.5 K/uL   RBC 5.17 (H) 3.87 - 5.11 MIL/uL   Hemoglobin 15.1 (H) 12.0 - 15.0 g/dL   HCT 08.647.0 (H) 57.836.0 - 46.946.0 %   MCV 90.9 80.0 - 100.0 fL   MCH 29.2 26.0 - 34.0 pg   MCHC 32.1 30.0 - 36.0 g/dL   RDW 62.912.1 52.811.5 - 41.315.5 %   Platelets 199 150 - 400 K/uL   nRBC 0.0 0.0 - 0.2 %    Comment: Performed at [facility], [address] [place], [place] [address]  Ferritin     Status: None   Collection Time: 12/31/18 11:18 AM  Result Value Ref Range   Ferritin 216 11 - 307 ng/mL    Comment: Performed at [facility], [address] [place], [place] [address]  Procalcitonin     Status: None   Collection Time: 12/31/18 11:18 AM  Result Value Ref Range   Procalcitonin 25.26 ng/mL    Comment:        Interpretation: PCT >= 10  ng/mL: Important systemic inflammatory response, almost exclusively due to severe bacterial sepsis or septic shock. (NOTE)       Sepsis PCT Algorithm           Lower Respiratory Tract                                      Infection PCT Algorithm    ----------------------------     ----------------------------         PCT < 0.25 ng/mL                PCT < 0.10 ng/mL         Strongly encourage             Strongly discourage   discontinuation of antibiotics    initiation of antibiotics    ----------------------------     -----------------------------       PCT 0.25 - 0.50 ng/mL            PCT 0.10 - 0.25 ng/mL               OR       >80% decrease in PCT            Discourage initiation of                                            antibiotics      Encourage discontinuation           of antibiotics    ----------------------------     -----------------------------         PCT >= 0.50 ng/mL              PCT 0.26 - 0.50 ng/mL                AND       <80% decrease in PCT             Encourage initiation of  antibiotics       Encourage continuation           of antibiotics    ----------------------------     -----------------------------        PCT >= 0.50 ng/mL                  PCT > 0.50 ng/mL               AND         increase in PCT                  Strongly encourage                                      initiation of antibiotics    Strongly encourage escalation           of antibiotics                                     -----------------------------                                           PCT <= 0.25 ng/mL                                                 OR                                        > 80% decrease in PCT                                     Discontinue / Do not initiate                                             antibiotics Performed at [facility], [address] [place], [place] [address]   Urinalysis,  Complete w Microscopic     Status: Abnormal   Collection Time: 12/31/18  5:49 PM  Result Value Ref Range   Color, Urine YELLOW (A) YELLOW   APPearance CLEAR (A) CLEAR   Specific Gravity, Urine 1.041 (H) 1.005 - 1.030   pH 6.0 5.0 - 8.0   Glucose, UA NEGATIVE NEGATIVE mg/dL   Hgb urine dipstick SMALL (A) NEGATIVE   Bilirubin Urine NEGATIVE NEGATIVE   Ketones, ur 20 (A) NEGATIVE mg/dL   Protein, ur 30 (A) NEGATIVE mg/dL   Nitrite NEGATIVE NEGATIVE   Leukocytes,Ua NEGATIVE NEGATIVE   RBC / HPF 0-5 0 - 5 RBC/hpf   WBC, UA 0-5 0 - 5 WBC/hpf   Bacteria, UA RARE (A) NONE SEEN   Squamous Epithelial / LPF NONE SEEN 0 - 5   Mucus PRESENT     Comment: Performed at [facility], [address] [place], [place]  [address]  SARS CORONAVIRUS 2 (TAT 6-24 HRS) Nasopharyngeal Nasopharyngeal Swab     Status: None   Collection Time: 12/31/18  7:42 PM   Specimen: Nasopharyngeal Swab  Result Value Ref Range   SARS Coronavirus 2 NEGATIVE NEGATIVE    Comment: (NOTE) SARS-CoV-2 target nucleic acids are NOT DETECTED. The SARS-CoV-2 RNA is generally detectable in upper and lower respiratory specimens during the acute phase of infection. Negative results do not preclude SARS-CoV-2 infection, do not rule out co-infections with other pathogens, and should not be used as the sole basis for treatment or other patient management decisions. Negative results must be combined with clinical observations, patient history, and epidemiological information. The expected result is Negative. Fact Sheet for Patients: HairSlick.no Fact Sheet for Healthcare Providers: quierodirigir.com This test is not yet approved or cleared by the [place] FDA and  has been authorized for detection and/or diagnosis of SARS-CoV-2 by FDA under an Emergency Use Authorization (EUA). This EUA will remain  in effect (meaning this test can be used) for the duration of  the COVID-19 declaration under Section 56 4(b)(1) of the Act, 21 U.S.C. section 360bbb-3(b)(1), unless the authorization is terminated or revoked sooner. Performed at [facility], [address] [place], [place] [address]   C-reactive protein     Status: Abnormal   Collection Time: 12/31/18  9:00 PM  Result Value Ref Range   CRP 25.8 (H) <1.0 mg/dL    Comment: Performed at [facility], [address], [place], [place] [address]  Fibrin derivatives D-Dimer [facility] only)     Status: Abnormal   Collection Time: 12/31/18  9:00 PM  Result Value Ref Range   Fibrin derivatives D-dimer ([facility]) >7,500.00 (H) 0.00 - 499.00 ng/mL (FEU)    Comment: (NOTE) <> Exclusion of Venous Thromboembolism (VTE) - OUTPATIENT ONLY   (Emergency Department or [place])   0-499 ng/ml (FEU): With a low to intermediate pretest probability                      for VTE this test result excludes the diagnosis                      of VTE.   >499 ng/ml (FEU) : VTE not excluded; additional work up for VTE is                      required. <> Testing on Inpatients and Evaluation of Disseminated Intravascular   Coagulation (DIC) Reference Range:   0-499 ng/ml (FEU) Performed at [facility], [address] [place], [place] [address]   Comprehensive metabolic panel     Status: Abnormal   Collection Time: 01/01/19  4:13 AM  Result Value Ref Range   Sodium 139 135 - 145 mmol/L   Potassium 3.8 3.5 - 5.1 mmol/L    Comment: HEMOLYSIS AT THIS LEVEL MAY AFFECT RESULT   Chloride 107 98 - 111 mmol/L   CO2 22 22 - 32 mmol/L   Glucose, Bld 95 70 - 99 mg/dL   BUN 28 (H) 8 - 23 mg/dL   Creatinine, Ser 2.95 0.44 - 1.00 mg/dL   Calcium 7.0 (L) 8.9 - 10.3 mg/dL   Total Protein 6.2 (L) 6.5 - 8.1 g/dL   Albumin 3.1 (L) 3.5 - 5.0 g/dL   AST 48 (H) 15 - 41 U/L    Comment: HEMOLYSIS AT THIS LEVEL MAY AFFECT RESULT   ALT  57 (H) 0 - 44 U/L   Alkaline Phosphatase 62 38 - 126 U/L   Total Bilirubin 2.2 (H) 0.3 -  1.2 mg/dL    Comment: HEMOLYSIS AT THIS LEVEL MAY AFFECT RESULT   GFR calc non Af Amer >60 >60 mL/min   GFR calc Af Amer >60 >60 mL/min   Anion gap 10 5 - 15    Comment: Performed at [facility], [address] [place], [place] [address]  Lipase, blood     Status: Abnormal   Collection Time: 01/01/19  4:13 AM  Result Value Ref Range   Lipase 92 (H) 11 - 51 U/L    Comment: Performed at [facility], [address] [place], [place] [address]   CT ANGIO CHEST PE W OR WO CONTRAST  Result Date: 01/01/2019 CLINICAL DATA:  Elevated D-dimer with shortness of breath. EXAM: CT ANGIOGRAPHY CHEST WITH CONTRAST TECHNIQUE: Multidetector CT imaging of the chest was performed using the standard protocol during bolus administration of intravenous contrast. Multiplanar CT image reconstructions and MIPs were obtained to evaluate the vascular anatomy. CONTRAST:  40mL OMNIPAQUE IOHEXOL 350 MG/ML SOLN COMPARISON:  None. FINDINGS: Cardiovascular: Contrast injection is sufficient to demonstrate satisfactory opacification of the pulmonary arteries to the segmental level. There is no pulmonary embolus. The main pulmonary artery is within normal limits for size. There is no CT evidence of acute right heart strain. There are atherosclerotic changes of the visualized aorta. Heart size is mildly enlarged. There is no significant pericardial effusion. Coronary artery calcifications are noted. Mediastinum/Nodes: --No mediastinal or hilar lymphadenopathy. --No axillary lymphadenopathy. --No supraclavicular lymphadenopathy. --Normal thyroid gland. --The esophagus is unremarkable Lungs/Pleura: There are multifocal areas of consolidation involving the right middle lobe, lingula, and bilateral lower lobes. The greatest area of involvement is at the left lower lobe. The trachea is unremarkable. There is no pneumothorax. There are trace bilateral pleural effusions. Upper Abdomen: See separate recent CT report for  complete details of the upper abdomen. Musculoskeletal: No chest wall abnormality. No acute or significant osseous findings. Review of the MIP images confirms the above findings. IMPRESSION: 1. No evidence for pulmonary embolus. 2. Multifocal areas of consolidation as detailed above consistent with multifocal pneumonia. 3. Trace bilateral pleural effusions. Aortic Atherosclerosis (ICD10-I70.0). Electronically Signed   By: Katherine Mantle M.D.   On: 01/01/2019 00:05   CT ABDOMEN PELVIS W CONTRAST  Result Date: 12/31/2018 CLINICAL DATA:  Lambert Mody lower abdominal pain beginning approximately 3 days ago, also with nausea, vomiting and diarrhea. Elevated lipase. EXAM: CT ABDOMEN AND PELVIS WITH CONTRAST TECHNIQUE: Multidetector CT imaging of the abdomen and pelvis was performed using the standard protocol following bolus administration of intravenous contrast. CONTRAST:  83mL OMNIPAQUE IOHEXOL 300 MG/ML  SOLN COMPARISON:  None. FINDINGS: Lower chest: There is consolidation in the medial base of the right middle lobe with hazy ground-glass opacities noted in both lower lobes and the base of the left upper lobe lingula and smaller areas of more dense consolidation also noted in the lower lobes, left greater than right. Findings consistent with multifocal pneumonia. Hepatobiliary: Liver normal in size and attenuation. 7 mm low-attenuation at the dome of the left lobe consistent with a cyst. Small amount of focal fat adjacent to the falciform ligament. 8 mm lesion with peripheral enhancement at the dome of the right lobe consistent with a hemangioma. Calcification along the dome of the right lobe. No other liver abnormalities. Dependent gallstones. Gallbladder wall appears slightly prominent, but not overtly thickened.  There is no adjacent inflammation. No bile duct dilation. Pancreas: There is peripancreatic fluid attenuation consistent with inflammation, extends the root of the small bowel mesentery and along the left  anterior pararenal fascia. The pancreatic tail appears mildly enlarged an area of heterogeneous attenuation, but no defined mass or cyst. Portal vein, splenic vein and superior mesenteric veins are widely patent. Spleen: Normal in size without focal abnormality. Adrenals/Urinary Tract: 16 mm left adrenal nodule. Relative washout is 28%, indeterminate. No right adrenal mass. Kidneys normal in size, orientation and position. Symmetric renal enhancement and excretion. 1 cm midpole right renal cyst. No other masses, no stones and no hydronephrosis. Normal ureters. Bladder is unremarkable. Stomach/Bowel: Stomach is unremarkable. Small bowel and colon are normal in caliber. No wall thickening. No inflammation. Normal appendix. Vascular/Lymphatic: Aortic atherosclerosis. No aneurysm. No enlarged lymph nodes. Reproductive: Uterus and bilateral adnexa are unremarkable. Other: Trace amount of free fluid in the pelvis. Musculoskeletal: No fracture or acute finding.  No bone lesion. IMPRESSION: 1. Acute pancreatitis. Area of heterogeneous enlargement in the tail of pancreas may reflect early pseudocyst formation. Small area of necrosis is felt less likely. No other evidence suggest necrosis. No formed pseudo cyst or evidence of an abscess. No venous thrombosis. 2. Lung base opacities consistent with multifocal infection. Recommend assessing for COVID-19 infection. 3. No other acute abnormalities. 4. Small gallstones. 5. Aortic atherosclerosis. Electronically Signed   By: Amie Portland M.D.   On: 12/31/2018 17:49   Portable chest 1 View  Result Date: 01/01/2019 CLINICAL DATA:  Bilateral pulmonary infiltrates EXAM: PORTABLE CHEST 1 VIEW COMPARISON:  CT PE study from the same day FINDINGS: Bilateral pulmonary opacities are again noted. There is no pneumothorax. No large pleural effusion. The heart size is enlarged. There is no acute osseous abnormality. IMPRESSION: Bilateral pulmonary opacities as seen on recent CT of the  chest, consistent with multifocal pneumonia. Electronically Signed   By: Katherine Mantle M.D.   On: 01/01/2019 00:49    Pending Labs Unresulted Labs (From admission, onward)    Start     Ordered   01/07/19 0500  Creatinine, serum  (enoxaparin (LOVENOX)    CrCl >/= 30 ml/min)  Weekly,   STAT    Comments: while on enoxaparin therapy    12/31/18 1937   01/01/19 0600  SAR CoV2 Serology (COVID 19)AB(IGG)IA  Once,   STAT     01/01/19 0559          Vitals/Pain Today's Vitals   01/01/19 0402 01/01/19 0502 01/01/19 0530 01/01/19 0540  BP: (!) 198/82 (!) 203/74 130/77 (!) 192/84  Pulse:  74    Resp: 14     Temp:      TempSrc:      SpO2: 98% 97%    Weight:      Height:      PainSc:        Isolation Precautions Airborne and Contact precautions  Medications Medications  aspirin EC tablet 81 mg (81 mg Oral Given 12/31/18 2051)  irbesartan (AVAPRO) tablet 300 mg (300 mg Oral Given 12/31/18 2051)  enoxaparin (LOVENOX) injection 40 mg (40 mg Subcutaneous Given 12/31/18 2156)  0.45 % sodium chloride infusion ( Intravenous New Bag/Given 12/31/18 2043)  acetaminophen (TYLENOL) tablet 650 mg (has no administration in time range)    Or  acetaminophen (TYLENOL) suppository 650 mg (has no administration in time range)  ketorolac (TORADOL) 15 MG/ML injection 15 mg (15 mg Intravenous Given 01/01/19 0151)  metoprolol tartrate (LOPRESSOR) injection 5  mg (5 mg Intravenous Given 01/01/19 0148)  ondansetron (ZOFRAN) tablet 4 mg (has no administration in time range)  ketorolac (TORADOL) 30 MG/ML injection (  Not Given 01/01/19 0347)  ketorolac (TORADOL) 30 MG/ML injection (  Not Given 01/01/19 0347)  hydrALAZINE (APRESOLINE) injection 5 mg (5 mg Intravenous Given 01/01/19 0420)  nitroGLYCERIN (NITROGLYN) 2 % ointment 1 inch (1 inch Topical Given 01/01/19 0550)  sodium chloride flush (NS) 0.9 % injection 3 mL (3 mLs Intravenous Given 12/31/18 1654)  sodium chloride 0.9 % bolus 1,000 mL (0 mLs  Intravenous Stopped 12/31/18 1825)  fentaNYL (SUBLIMAZE) injection 50 mcg (50 mcg Intravenous Given 12/31/18 1656)  iohexol (OMNIPAQUE) 300 MG/ML solution 75 mL (75 mLs Intravenous Contrast Given 12/31/18 1717)  0.9 %  sodium chloride infusion ( Intravenous Stopped 01/01/19 0426)  iohexol (OMNIPAQUE) 350 MG/ML injection 75 mL (75 mLs Intravenous Contrast Given 12/31/18 2346)    Mobility walks Low fall risk   Focused Assessments Pulmonary Assessment Handoff:  Lung sounds:   O2 Device: Room Air        R Recommendations: See Admitting Provider Note  Report given to:   Additional Notes:

## 2019-01-01 NOTE — ED Notes (Signed)
Attempt to call report, no rn available.  

## 2019-01-01 NOTE — Progress Notes (Signed)
Brief Hx: 2 days ago, Due to persistent symptoms she presented to [facility] for evaluation. She left before lab results returned which did reveal an lipase of 3,922! She returns today for persistent pain with N/V and increasing weakness. She describes the pain as severe, piercing to the back. She has not been able to eat and has taken in very little fluid. No prior GI history and no knolwedge of having Gallstones. Lab revealed mild AKI with Cr rising from 0.89 to 101 and BUN rising from 23 to 49. CT abd/pelvis reveals cholelithiasis w/o cholecystitis, no CBD dilatation, inflammation of the pancreas with swelling of the pancreatic tail w/o cyst or mass. Scan did reveal multi-focal ground glass infiltrates suggestive of viral/covid infection.   Subjective: States her abdominal pain is subsiding however she was still nauseated this morning.  Her blood pressure was labile throughout this morning.  Awaiting Covid test result.  Objective: Vital signs in last 24 hours: Temp:  [98.2 F (36.8 C)-99.1 F (37.3 C)] 98.2 F (36.8 C) (12/14 1336) Pulse Rate:  [66-93] 66 (12/14 1336) Resp:  [14-23] 16 (12/14 1336) BP: (130-203)/(63-93) 194/77 (12/14 1336) SpO2:  [95 %-99 %] 99 % (12/14 1336)  Intake/Output from previous day: 12/13 0701 - 12/14 0700 In: 2000 [I.V.:1000] Out: -  Intake/Output this shift: Total I/O In: -  Out: 600 [Urine:600]  General appearance -appears anxious  eyes: PERRL, lids and conjunctivae normal, no icterus ENMT: Mucous membranes are dry. Posterior pharynx clear of any exudate or lesions.Normal dentition.  Respiratory: No increased WOB, no accessory muscle use. Decreased BS overall, rales at right base. Cardiovascular: Regular rate and rhythm, no murmurs / rubs / gallops. No extremity edema. 2+ pedal pulses. No carotid bruits.  Abdomen: BS + x 4, mild tenderness to percussion, worst at epigastrum and RUQ, very tender to deep palpation epigastrum and RUQ.  Musculoskeletal: no  clubbing / cyanosis. No joint deformity upper and lower extremities. Good ROM, no contractures. Normal muscle tone.  Skin: no rashes, lesions, ulcers. No induration Neurologic: CN 2-12 grossly intact. Sensation intact, DTR normal. Strength 5/5 in all 4.  Psychiatric: Normal judgment and insight. Alert and oriented x 3. Normal mood.   Results for orders placed or performed during the hospital encounter of 12/31/18 (from the past 24 hour(s))  Urinalysis, Complete w Microscopic     Status: Abnormal   Collection Time: 12/31/18  5:49 PM  Result Value Ref Range   Color, Urine YELLOW (A) YELLOW   APPearance CLEAR (A) CLEAR   Specific Gravity, Urine 1.041 (H) 1.005 - 1.030   pH 6.0 5.0 - 8.0   Glucose, UA NEGATIVE NEGATIVE mg/dL   Hgb urine dipstick SMALL (A) NEGATIVE   Bilirubin Urine NEGATIVE NEGATIVE   Ketones, ur 20 (A) NEGATIVE mg/dL   Protein, ur 30 (A) NEGATIVE mg/dL   Nitrite NEGATIVE NEGATIVE   Leukocytes,Ua NEGATIVE NEGATIVE   RBC / HPF 0-5 0 - 5 RBC/hpf   WBC, UA 0-5 0 - 5 WBC/hpf   Bacteria, UA RARE (A) NONE SEEN   Squamous Epithelial / LPF NONE SEEN 0 - 5   Mucus PRESENT   SARS CORONAVIRUS 2 (TAT 6-24 HRS) Nasopharyngeal Nasopharyngeal Swab     Status: None   Collection Time: 12/31/18  7:42 PM   Specimen: Nasopharyngeal Swab  Result Value Ref Range   SARS Coronavirus 2 NEGATIVE NEGATIVE  C-reactive protein     Status: Abnormal   Collection Time: 12/31/18  9:00 PM  Result Value Ref  Range   CRP 25.8 (H) <1.0 mg/dL  Fibrin derivatives D-Dimer ([facility] only)     Status: Abnormal   Collection Time: 12/31/18  9:00 PM  Result Value Ref Range   Fibrin derivatives D-dimer ([facility]) >7,500.00 (H) 0.00 - 499.00 ng/mL (FEU)  Comprehensive metabolic panel     Status: Abnormal   Collection Time: 01/01/19  4:13 AM  Result Value Ref Range   Sodium 139 135 - 145 mmol/L   Potassium 3.8 3.5 - 5.1 mmol/L   Chloride 107 98 - 111 mmol/L   CO2 22 22 - 32 mmol/L   Glucose, Bld 95 70 - 99 mg/dL    BUN 28 (H) 8 - 23 mg/dL   Creatinine, Ser 6.29 0.44 - 1.00 mg/dL   Calcium 7.0 (L) 8.9 - 10.3 mg/dL   Total Protein 6.2 (L) 6.5 - 8.1 g/dL   Albumin 3.1 (L) 3.5 - 5.0 g/dL   AST 48 (H) 15 - 41 U/L   ALT 57 (H) 0 - 44 U/L   Alkaline Phosphatase 62 38 - 126 U/L   Total Bilirubin 2.2 (H) 0.3 - 1.2 mg/dL   GFR calc non Af Amer >60 >60 mL/min   GFR calc Af Amer >60 >60 mL/min   Anion gap 10 5 - 15  Lipase, blood     Status: Abnormal   Collection Time: 01/01/19  4:13 AM  Result Value Ref Range   Lipase 92 (H) 11 - 51 U/L    Studies/Results: CT ANGIO CHEST PE W OR WO CONTRAST  Result Date: 01/01/2019 CLINICAL DATA:  Elevated D-dimer with shortness of breath. EXAM: CT ANGIOGRAPHY CHEST WITH CONTRAST TECHNIQUE: Multidetector CT imaging of the chest was performed using the standard protocol during bolus administration of intravenous contrast. Multiplanar CT image reconstructions and MIPs were obtained to evaluate the vascular anatomy. CONTRAST:  51mL OMNIPAQUE IOHEXOL 350 MG/ML SOLN COMPARISON:  None. FINDINGS: Cardiovascular: Contrast injection is sufficient to demonstrate satisfactory opacification of the pulmonary arteries to the segmental level. There is no pulmonary embolus. The main pulmonary artery is within normal limits for size. There is no CT evidence of acute right heart strain. There are atherosclerotic changes of the visualized aorta. Heart size is mildly enlarged. There is no significant pericardial effusion. Coronary artery calcifications are noted. Mediastinum/Nodes: --No mediastinal or hilar lymphadenopathy. --No axillary lymphadenopathy. --No supraclavicular lymphadenopathy. --Normal thyroid gland. --The esophagus is unremarkable Lungs/Pleura: There are multifocal areas of consolidation involving the right middle lobe, lingula, and bilateral lower lobes. The greatest area of involvement is at the left lower lobe. The trachea is unremarkable. There is no pneumothorax. There are trace  bilateral pleural effusions. Upper Abdomen: See separate recent CT report for complete details of the upper abdomen. Musculoskeletal: No chest wall abnormality. No acute or significant osseous findings. Review of the MIP images confirms the above findings. IMPRESSION: 1. No evidence for pulmonary embolus. 2. Multifocal areas of consolidation as detailed above consistent with multifocal pneumonia. 3. Trace bilateral pleural effusions. Aortic Atherosclerosis (ICD10-I70.0). Electronically Signed   By: Katherine Mantle M.D.   On: 01/01/2019 00:05   CT ABDOMEN PELVIS W CONTRAST  Result Date: 12/31/2018 CLINICAL DATA:  Lambert Mody lower abdominal pain beginning approximately 3 days ago, also with nausea, vomiting and diarrhea. Elevated lipase. EXAM: CT ABDOMEN AND PELVIS WITH CONTRAST TECHNIQUE: Multidetector CT imaging of the abdomen and pelvis was performed using the standard protocol following bolus administration of intravenous contrast. CONTRAST:  51mL OMNIPAQUE IOHEXOL 300 MG/ML  SOLN COMPARISON:  None. FINDINGS: Lower chest: There is consolidation in the medial base of the right middle lobe with hazy ground-glass opacities noted in both lower lobes and the base of the left upper lobe lingula and smaller areas of more dense consolidation also noted in the lower lobes, left greater than right. Findings consistent with multifocal pneumonia. Hepatobiliary: Liver normal in size and attenuation. 7 mm low-attenuation at the dome of the left lobe consistent with a cyst. Small amount of focal fat adjacent to the falciform ligament. 8 mm lesion with peripheral enhancement at the dome of the right lobe consistent with a hemangioma. Calcification along the dome of the right lobe. No other liver abnormalities. Dependent gallstones. Gallbladder wall appears slightly prominent, but not overtly thickened. There is no adjacent inflammation. No bile duct dilation. Pancreas: There is peripancreatic fluid attenuation consistent with  inflammation, extends the root of the small bowel mesentery and along the left anterior pararenal fascia. The pancreatic tail appears mildly enlarged an area of heterogeneous attenuation, but no defined mass or cyst. Portal vein, splenic vein and superior mesenteric veins are widely patent. Spleen: Normal in size without focal abnormality. Adrenals/Urinary Tract: 16 mm left adrenal nodule. Relative washout is 28%, indeterminate. No right adrenal mass. Kidneys normal in size, orientation and position. Symmetric renal enhancement and excretion. 1 cm midpole right renal cyst. No other masses, no stones and no hydronephrosis. Normal ureters. Bladder is unremarkable. Stomach/Bowel: Stomach is unremarkable. Small bowel and colon are normal in caliber. No wall thickening. No inflammation. Normal appendix. Vascular/Lymphatic: Aortic atherosclerosis. No aneurysm. No enlarged lymph nodes. Reproductive: Uterus and bilateral adnexa are unremarkable. Other: Trace amount of free fluid in the pelvis. Musculoskeletal: No fracture or acute finding.  No bone lesion. IMPRESSION: 1. Acute pancreatitis. Area of heterogeneous enlargement in the tail of pancreas may reflect early pseudocyst formation. Small area of necrosis is felt less likely. No other evidence suggest necrosis. No formed pseudo cyst or evidence of an abscess. No venous thrombosis. 2. Lung base opacities consistent with multifocal infection. Recommend assessing for COVID-19 infection. 3. No other acute abnormalities. 4. Small gallstones. 5. Aortic atherosclerosis. Electronically Signed   By: Amie Portlandavid  Ormond M.D.   On: 12/31/2018 17:49   Portable chest 1 View  Result Date: 01/01/2019 CLINICAL DATA:  Bilateral pulmonary infiltrates EXAM: PORTABLE CHEST 1 VIEW COMPARISON:  CT PE study from the same day FINDINGS: Bilateral pulmonary opacities are again noted. There is no pneumothorax. No large pleural effusion. The heart size is enlarged. There is no acute osseous  abnormality. IMPRESSION: Bilateral pulmonary opacities as seen on recent CT of the chest, consistent with multifocal pneumonia. Electronically Signed   By: Katherine Mantlehristopher  Green M.D.   On: 01/01/2019 00:49    Scheduled Meds: . aspirin EC  81 mg Oral Daily  . enoxaparin (LOVENOX) injection  40 mg Subcutaneous Q24H  . irbesartan  300 mg Oral Daily  . ketorolac  15 mg Intravenous Q6H  . metoprolol tartrate  25 mg Oral BID  . nitroGLYCERIN  1 inch Topical Q6H  . polyethylene glycol  17 g Oral Daily   Continuous Infusions: PRN Meds:acetaminophen **OR** acetaminophen, hydrALAZINE, labetalol, ondansetron (ZOFRAN) IV, promethazine  Assessment/Plan:  1. Pancreatitis - most likely passed a gallstone with cholelithiasis on CT but no CBD dilatation. Inflammed pancrease. Lipase from 3,922 to 300. Dehydration by report and elevated BUN/Cr. -Lipase improved significantly.  LFTs also improv - Cont IVF D5 1/2NS  -  Adequate pain management - Zofran and Phenergan for nausea/meeting  -  Clear liquid diet may advance as tolerated -  F/U lab: CMet in AM  -Fasting lipid panel shows triglyceride of 254 -Patient is a still unable to have p.o. intake even though her pain is subsiding.  Instructed patient and nurses to try to have clear liquid diet and may advance as tolerated.  2. Multi-focal ground glass infilrates -and remains asymptomatic so far on room air - patient and spouse report that they have not been out except for groceries, no Covid contacts.  - Suspicious CT lungs -Awaiting Covid test report  - Standard precautions -Follow-up chest x-ray on 01/01/2019 shows Bilateral pulmonary opacities as seen on recent CT of the chest, consistent with multifocal pneumonia. -FDP over 7.5K, CRP 25.8 -Follow Inflammatory markers: ferritin, CRP, procalcitonin, D-dimer  3. HTN -labile  - elevated BP in ED. Patiend has missed meds for several days plus pain. -Resumed home meds added metoprolol 25 mg twice  daily -Also as needed hydralazine and labetalol with parameters given   LOS: 0 days   Michaell Grider Harold Hedge

## 2019-01-02 ENCOUNTER — Inpatient Hospital Stay: Payer: Medicare Other

## 2019-01-02 LAB — CBC
HCT: 37.6 % (ref 36.0–46.0)
Hemoglobin: 12.5 g/dL (ref 12.0–15.0)
MCH: 30.1 pg (ref 26.0–34.0)
MCHC: 33.2 g/dL (ref 30.0–36.0)
MCV: 90.6 fL (ref 80.0–100.0)
Platelets: 164 10*3/uL (ref 150–400)
RBC: 4.15 MIL/uL (ref 3.87–5.11)
RDW: 12 % (ref 11.5–15.5)
WBC: 10.6 10*3/uL — ABNORMAL HIGH (ref 4.0–10.5)
nRBC: 0 % (ref 0.0–0.2)

## 2019-01-02 LAB — COMPREHENSIVE METABOLIC PANEL
ALT: 46 U/L — ABNORMAL HIGH (ref 0–44)
AST: 43 U/L — ABNORMAL HIGH (ref 15–41)
Albumin: 3 g/dL — ABNORMAL LOW (ref 3.5–5.0)
Alkaline Phosphatase: 76 U/L (ref 38–126)
Anion gap: 16 — ABNORMAL HIGH (ref 5–15)
BUN: 21 mg/dL (ref 8–23)
CO2: 17 mmol/L — ABNORMAL LOW (ref 22–32)
Calcium: 7.7 mg/dL — ABNORMAL LOW (ref 8.9–10.3)
Chloride: 106 mmol/L (ref 98–111)
Creatinine, Ser: 0.53 mg/dL (ref 0.44–1.00)
GFR calc Af Amer: >60 mL/min (ref >60)
GFR calc non Af Amer: >60 mL/min (ref >60)
Glucose, Bld: 90 mg/dL (ref 70–99)
Potassium: 2.8 mmol/L — ABNORMAL LOW (ref 3.5–5.1)
Sodium: 139 mmol/L (ref 135–145)
Total Bilirubin: 2.2 mg/dL — ABNORMAL HIGH (ref 0.3–1.2)
Total Protein: 6.8 g/dL (ref 6.5–8.1)

## 2019-01-02 LAB — LIPASE, BLOOD: Lipase: 29 U/L (ref 11–51)

## 2019-01-02 IMAGING — MR MR ABDOMEN WO/W CM MRCP
18 of 21 series · 43 of 48 positions shown · IV contrast (gadavist)
Comparison: CT on [DATE]

CLINICAL DATA: Abdominal pain for 2 days. Nausea. Cholelithiasis.
Acute pancreatitis.

EXAM:
MRI ABDOMEN WITHOUT AND WITH CONTRAST (INCLUDING MRCP)
TECHNIQUE: Multiplanar multisequence MR imaging of the abdomen was performed
both before and after the administration of intravenous contrast.
Heavily T2-weighted images of the biliary and pancreatic ducts were
obtained, and three-dimensional MRCP images were rendered by post
processing.
CONTRAST:  4mL GADAVIST GADOBUTROL 1 MMOL/ML IV SOLN

[Series 2: T2 · coronal · 6.0mm · 1.19mm/px · 1 of 30 slices shown (1 of 2)]
[im 1/30]
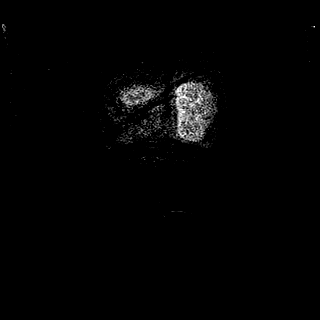

[Series 3: T2 · axial · 6.0mm · 1.19mm/px · 1 of 36 slices shown (2 of 2)]
[im 1/36]
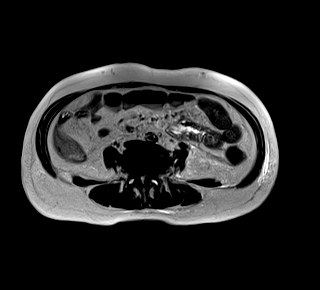

[Series 4: T1 · axial · 6.0mm · 0.74mm/px · z∈[-122,+130]mm · 2 of 72 slices shown]
[im 1/72]
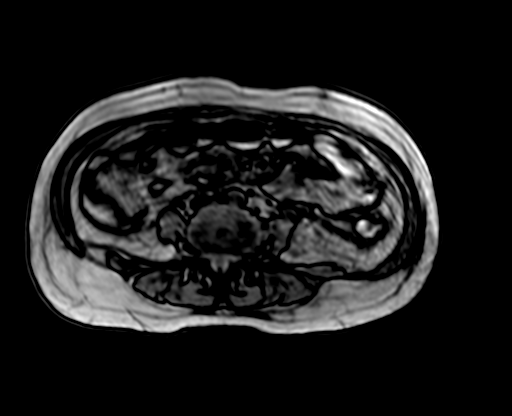
[im 72/72]
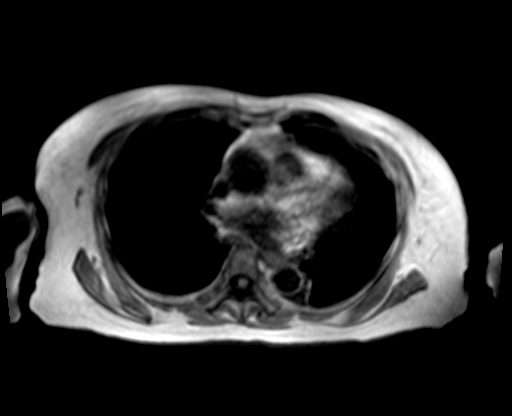

[Series 7: T2 fat-sat · axial · 6.0mm · 1.19mm/px · 1 of 40 slices shown]
[im 1/40]
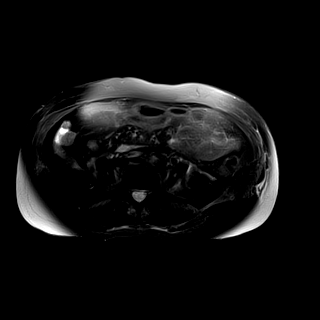

[Series 8: ax dwi_tracew · axial · 6.0mm · 1.42mm/px · z∈[-137,+144]mm · 4 of 120 slices shown]
[im 1/120]
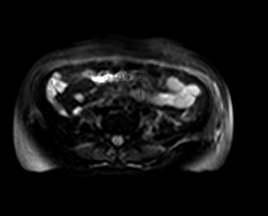
[im 40/120]
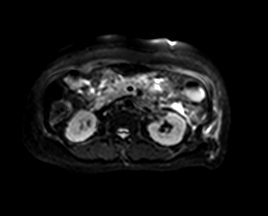
[im 80/120]
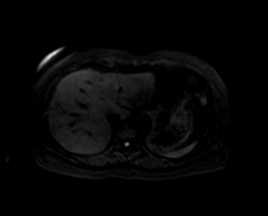
[im 120/120]
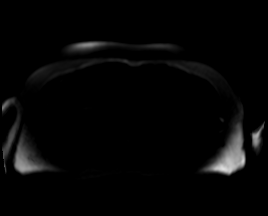

[Series 9: ax dwi_adc · axial · 6.0mm · 1.42mm/px · z∈[-137,+144]mm · 2 of 40 slices shown]
[im 1/40]
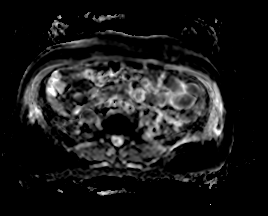
[im 40/40]
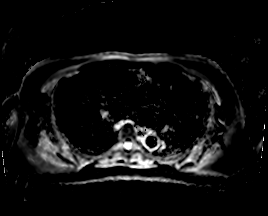

[Series 13: MRCP · coronal · 3.0mm · 1.12mm/px · 1 of 17 slices shown]
[im 1/17]
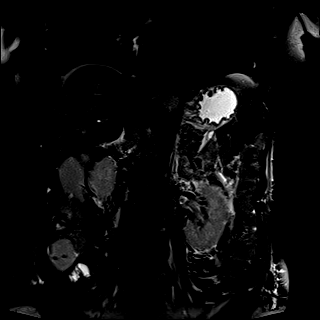

[Series 14: radials · coronal · 50.0mm · 0.78mm/px · 1 of 5 slices shown]
[im 1/5]
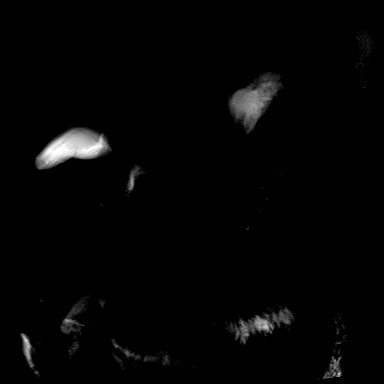

[Series 15: T1 dynamic fat-sat · axial · non-contrast · 3.5mm · 1.19mm/px · z∈[-121,+128]mm · 3 of 72 slices shown (1 of 5)]
[im 1/72]
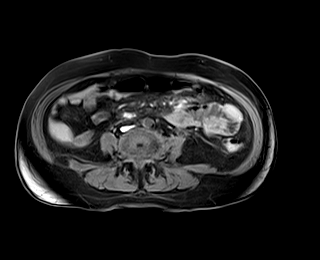
[im 36/72]
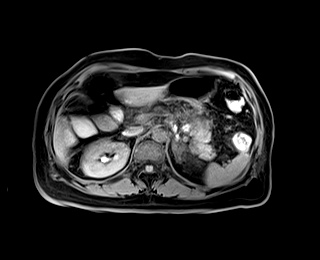
[im 72/72]
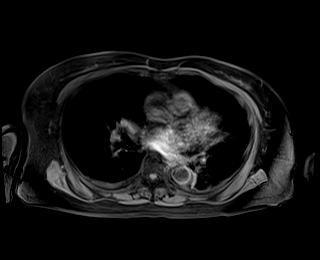

[Series 16: T1 dynamic fat-sat post-contrast · axial · 3.5mm · 1.19mm/px · z∈[-121,+128]mm · 3 of 72 slices shown (1 of 4)]
[im 1/72]
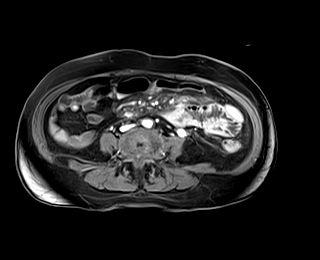
[im 36/72]
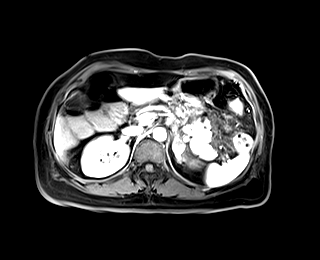
[im 72/72]
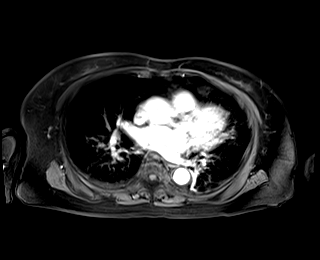

[Series 17: T1 dynamic fat-sat · axial · 3.5mm · 1.19mm/px · z∈[-121,+128]mm · 3 of 72 slices shown (2 of 5)]
[im 1/72]
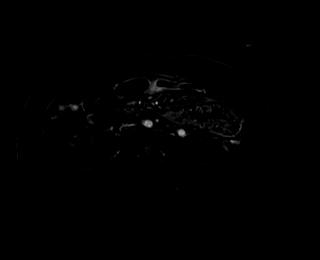
[im 36/72]
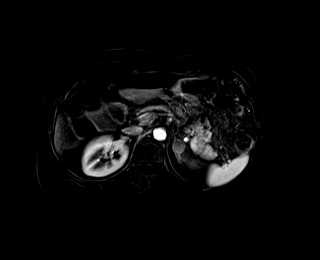
[im 72/72]
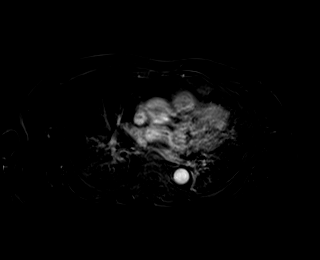

[Series 18: T1 dynamic fat-sat post-contrast · axial · 3.5mm · 1.19mm/px · z∈[-121,+128]mm · 3 of 72 slices shown (2 of 4)]
[im 1/72]
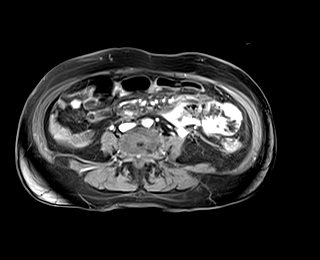
[im 36/72]
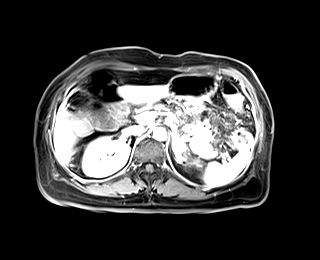
[im 72/72]
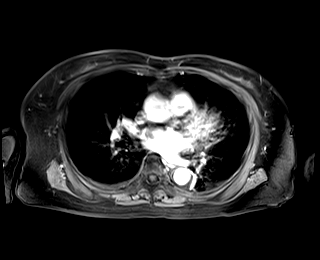

[Series 19: T1 dynamic fat-sat · axial · 3.5mm · 1.19mm/px · z∈[-121,+128]mm · 3 of 72 slices shown (3 of 5)]
[im 1/72]
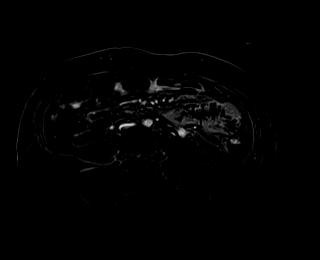
[im 36/72]
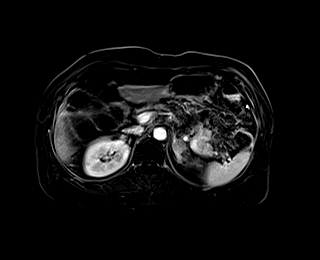
[im 72/72]
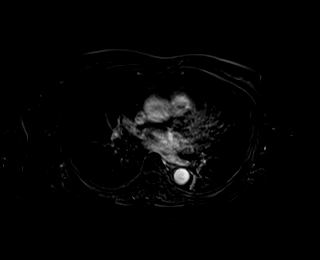

[Series 20: T1 dynamic fat-sat post-contrast · axial · 3.5mm · 1.19mm/px · z∈[-121,+128]mm · 3 of 72 slices shown (3 of 4)]
[im 1/72]
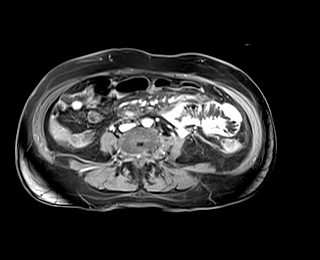
[im 36/72]
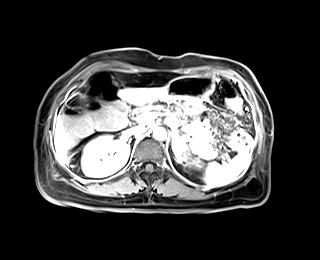
[im 72/72]
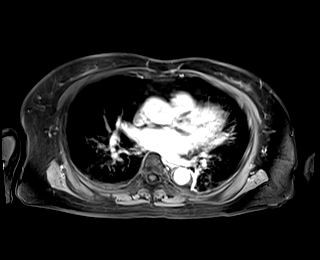

[Series 21: T1 dynamic fat-sat · axial · 3.5mm · 1.19mm/px · z∈[-121,+128]mm · 3 of 72 slices shown (4 of 5)]
[im 1/72]
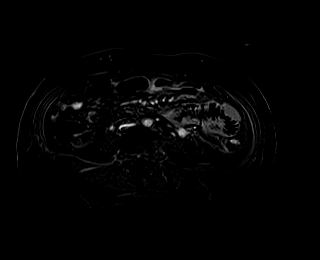
[im 36/72]
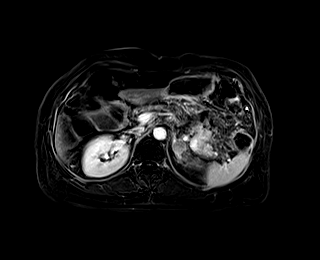
[im 72/72]
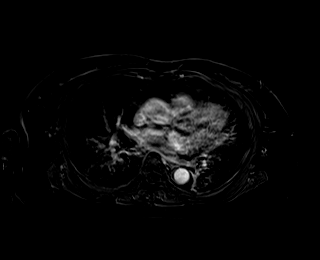

[Series 22: T1 dynamic post-contrast · coronal · 3.0mm · 1.31mm/px · 3 of 72 slices shown]
[im 1/72]
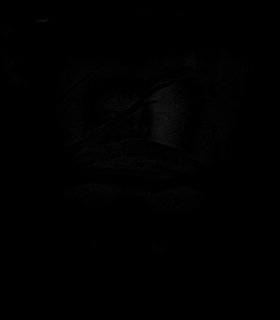
[im 36/72]
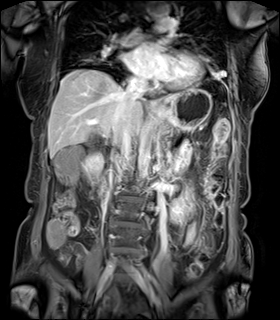
[im 72/72]
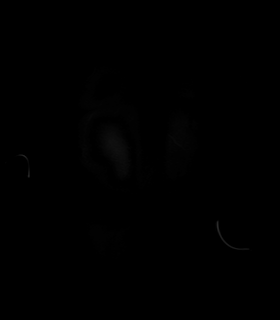

[Series 23: T1 dynamic fat-sat post-contrast · axial · 3.5mm · 1.19mm/px · z∈[-121,+128]mm · 3 of 72 slices shown (4 of 4)]
[im 1/72]
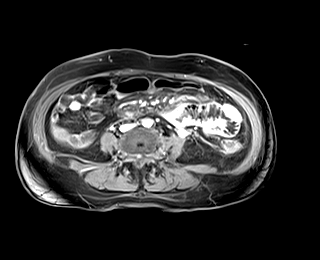
[im 36/72]
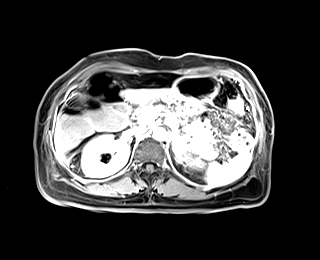
[im 72/72]
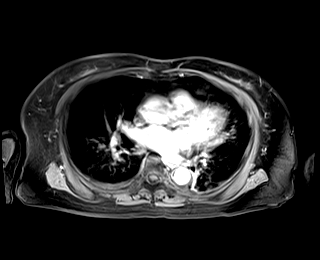

[Series 24: T1 dynamic fat-sat · axial · 3.5mm · 1.19mm/px · z∈[-121,+128]mm · 3 of 72 slices shown (5 of 5)]
[im 1/72]
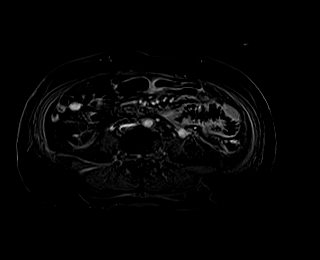
[im 36/72]
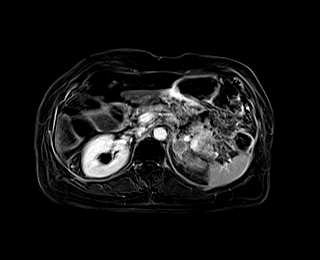
[im 72/72]
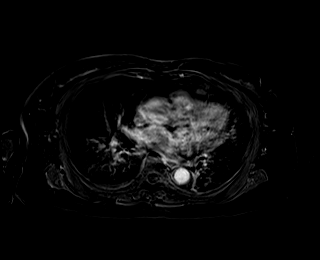

[43 of 48 positions shown; findings below may reference images not displayed]

FINDINGS: Lower chest: Mild bibasilar atelectasis.

Hepatobiliary: Subcentimeter cyst is seen in the central left
hepatic lobe. A 1 cm lesion is also seen in the dome of the right
lobe which shows T1 hypointensity, T2 isointensity, and no
significant contrast enhancement. This appears to show
calcifications on recent CT, and is an indeterminate but probably
benign lesion. No evidence of steatosis.

Tiny gallstones are better visualized on recent CT. No evidence of
cholecystitis. No evidence of biliary ductal dilatation with common
bile duct measuring 5 mm in diameter. No evidence of
choledocholithiasis or biliary stricture.

Pancreas: Mild-to-moderate pancreatic edema is seen which is
greatest in the pancreatic tail. Mild peripancreatic inflammatory
changes seen with minimal amount of peripancreatic fluid. These
findings are consistent with acute pancreatitis. No evidence of
pancreatic ductal dilatation or pancreas divisum. No evidence of
pancreatic mass or pancreatic necrosis. No pseudocysts identified.

Spleen:  Within normal limits in size and appearance.

Adrenals/Urinary Tract: A 1.8 cm homogeneous left adrenal mass is
seen, however no signal dropout is seen on chemical shift imaging.
Small right renal cyst is noted. No evidence of renal mass or
hydronephrosis.

Stomach/Bowel: Visualized portion unremarkable.

Vascular/Lymphatic: No pathologically enlarged lymph nodes
identified. No abdominal aortic aneurysm.

Other:  None.

Musculoskeletal:  No suspicious bone lesions identified.
IMPRESSION: 1. Mild to moderate acute pancreatitis. No evidence of pancreatic
necrosis or pseudocyst.
2. Cholelithiasis. No radiographic evidence of cholecystitis.
3. No evidence of biliary ductal dilatation or choledocholithiasis.
4. 1.8 cm indeterminate left renal mass. This most likely represents
a benign adenoma in patient without history of cancer. Recommend
continued follow-up by MRI in 12 months to confirm stability. This
recommendation follows ACR consensus guidelines: Management of
Incidental Adrenal Masses: A White Paper of the ACR Incidental
Findings Committee. [HOSPITAL] [[id number]];14:[PHONE_NUMBER].
5. 1.8 cm indeterminate right lobe lesion, likely benign. Recommend
continued attention on follow-up MRI in 12 months.

## 2019-01-02 MED ORDER — DEXTROSE-NACL 5-0.45 % IV SOLN: INTRAVENOUS | Status: DC

## 2019-01-02 MED ORDER — SODIUM CHLORIDE 0.9 % IV SOLN
2.0000 g | INTRAVENOUS | Status: DC
  Administered 2019-01-02 – 2019-01-05 (×4): 2 g via INTRAVENOUS
  Filled 2019-01-02 (×4): qty 2

## 2019-01-02 MED ORDER — POTASSIUM CHLORIDE CRYS ER 20 MEQ PO TBCR
40.0000 meq | EXTENDED_RELEASE_TABLET | Freq: Two times a day (BID) | ORAL | Status: DC
  Administered 2019-01-02 – 2019-01-04 (×6): 40 meq via ORAL
  Filled 2019-01-02 (×6): qty 2

## 2019-01-02 MED ORDER — GADOBUTROL 1 MMOL/ML IV SOLN
4.0000 mL | Freq: Once | INTRAVENOUS | Status: AC | PRN
  Administered 2019-01-02: 4 mL via INTRAVENOUS

## 2019-01-02 MED ORDER — ENSURE ENLIVE PO LIQD: 237.0000 mL | Freq: Three times a day (TID) | ORAL | Status: DC

## 2019-01-02 MED ORDER — SODIUM CHLORIDE 0.9 % IV SOLN
500.0000 mg | INTRAVENOUS | Status: DC
  Administered 2019-01-02 – 2019-01-05 (×4): 500 mg via INTRAVENOUS
  Filled 2019-01-02 (×4): qty 500

## 2019-01-02 NOTE — Progress Notes (Addendum)
PROGRESS NOTE    Madeline Taylor  GNF:[id number] DOB: 25-Apr-1935 DOA: 12/31/2018 PCP: Galen Manila, NP (Inactive)    Brief Narrative:  2 days ago, Due to persistent symptoms she presented to [facility] for evaluation. She left before lab results returned which did reveal an lipase of 3,922! She returns today for persistent pain with N/V and increasing weakness. She describes the pain as severe, piercing to the back. She has not been able to eat and has taken in very little fluid. No prior GI history and no knolwedge of having Gallstones. Lab revealed mild AKI with Cr rising from 0.89 to 101 and BUN rising from 23 to 49. CT abd/pelvis reveals cholelithiasis w/o cholecystitis, no CBD dilatation, inflammation of the pancreas with swelling of the pancreatic tail w/o cyst or mass. Scan did reveal multi-focal ground glass infiltrates suggestive of viral/covid infection.     Consultants:   none  Procedures:  CT abdomen and pelvis IMPRESSION: 1. Acute pancreatitis. Area of heterogeneous enlargement in the tail of pancreas may reflect early pseudocyst formation. Small area of necrosis is felt less likely. No other evidence suggest necrosis. No formed pseudo cyst or evidence of an abscess. No venous thrombosis. 2. Lung base opacities consistent with multifocal infection. Recommend assessing for COVID-19 infection. 3. No other acute abnormalities. 4. Small gallstones. 5. Aortic atherosclerosis   CT angio chest IMPRESSION: 1. No evidence for pulmonary embolus. 2. Multifocal areas of consolidation as detailed above consistent with multifocal pneumonia. 3. Trace bilateral pleural effusions.  Aortic Atherosclerosis (ICD10-I70.0).  Antimicrobials:   None   Subjective: Tolerated clears barely with still abdominal pain.  But better than when she came in.  Denies shortness of breath or chest pain.  Objective: Vitals:   01/01/19 1458 01/02/19 0008 01/02/19 0630 01/02/19 0912  BP:  123/64 (!) 166/73 (!) 187/73 (!) 187/80  Pulse: 69 64 77 74  Resp:  18  20  Temp:  98.4 F (36.9 C)  98.3 F (36.8 C)  TempSrc:    Oral  SpO2:  95% 97% 97%  Weight:      Height:        Intake/Output Summary (Last 24 hours) at 01/02/2019 1222 Last data filed at 01/02/2019 0900 Gross per 24 hour  Intake 780 ml  Output --  Net 780 ml   Filed Weights   12/31/18 1116  Weight: 47.2 kg    Examination:  General exam: Appears calm and comfortable NAD Respiratory system: Clear to auscultation. Respiratory effort normal. Cardiovascular system: S1 & S2 heard, RRR. No JVD, murmurs, rubs, gallops or clicks. Gastrointestinal system: Abdomen is nondistended, soft and nontender. Normal bowel sounds heard. Central nervous system: Alert and oriented. No focal neurological deficits. Extremities: No edema Skin: Warm and dry Psychiatry: Judgement and insight appear normal. Mood & affect appropriate.     Data Reviewed: I have personally reviewed following labs and imaging studies  CBC: Recent Labs  Lab 12/29/18 2053 12/31/18 1118 01/02/19 0805  WBC 14.9* 10.9* 10.6*  HGB 15.3* 15.1* 12.5  HCT 47.3* 47.0* 37.6  MCV 90.8 90.9 90.6  PLT 254 199 164   Basic Metabolic Panel: Recent Labs  Lab 12/29/18 2053 12/31/18 1118 01/01/19 0413 01/02/19 0805  NA 143 138 139 139  K 3.0* 3.3* 3.8 2.8*  CL 105 99 107 106  CO2 17*  GLUCOSE 176* 127* 95 90  BUN 23 42* 28* 21  CREATININE 0.82 1.01* 0.62 0.53  CALCIUM 9.5 8.4* 7.0* 7.7*  GFR: Estimated Creatinine Clearance: 37.6 mL/min (by C-G formula based on SCr of 0.53 mg/dL). Liver Function Tests: Recent Labs  Lab 12/29/18 2053 12/31/18 1118 01/01/19 0413 01/02/19 0805  AST 399* 59* 48* 43*  ALT 225* 95* 57* 46*  ALKPHOS 139* 86 62 76  BILITOT 1.6* 1.7* 2.2* 2.2*  PROT 7.9 8.1 6.2* 6.8  ALBUMIN 4.3 4.2 3.1* 3.0*   Recent Labs  Lab 12/29/18 2053 12/31/18 1118 01/01/19 0413 01/02/19 0805  LIPASE 3,922* 322* 92*  29   No results for input(s): AMMONIA in the last 168 hours. Coagulation Profile: No results for input(s): INR, PROTIME in the last 168 hours. Cardiac Enzymes: No results for input(s): CKTOTAL, CKMB, CKMBINDEX, TROPONINI in the last 168 hours. BNP (last 3 results) No results for input(s): PROBNP in the last 8760 hours. HbA1C: No results for input(s): HGBA1C in the last 72 hours. CBG: No results for input(s): GLUCAP in the last 168 hours. Lipid Profile: No results for input(s): CHOL, HDL, LDLCALC, TRIG, CHOLHDL, LDLDIRECT in the last 72 hours. Thyroid Function Tests: No results for input(s): TSH, T4TOTAL, FREET4, T3FREE, THYROIDAB in the last 72 hours. Anemia Panel: Recent Labs    12/31/18 1118  FERRITIN 216   Sepsis Labs: Recent Labs  Lab 12/31/18 1118  PROCALCITON 25.26    Recent Results (from the past 240 hour(s))  SARS CORONAVIRUS 2 (TAT 6-24 HRS) Nasopharyngeal Nasopharyngeal Swab     Status: None   Collection Time: 12/31/18  7:42 PM   Specimen: Nasopharyngeal Swab  Result Value Ref Range Status   SARS Coronavirus 2 NEGATIVE NEGATIVE Final    Comment: (NOTE) SARS-CoV-2 target nucleic acids are NOT DETECTED. The SARS-CoV-2 RNA is generally detectable in upper and lower respiratory specimens during the acute phase of infection. Negative results do not preclude SARS-CoV-2 infection, do not rule out co-infections with other pathogens, and should not be used as the sole basis for treatment or other patient management decisions. Negative results must be combined with clinical observations, patient history, and epidemiological information. The expected result is Negative. Fact Sheet for Patients: HairSlick.no Fact Sheet for Healthcare Providers: quierodirigir.com This test is not yet approved or cleared by the [place] FDA and  has been authorized for detection and/or diagnosis of SARS-CoV-2 by FDA under an  Emergency Use Authorization (EUA). This EUA will remain  in effect (meaning this test can be used) for the duration of the COVID-19 declaration under Section 56 4(b)(1) of the Act, 21 U.S.C. section 360bbb-3(b)(1), unless the authorization is terminated or revoked sooner. Performed at [facility], [address], [place], [place] [address]   SARS CORONAVIRUS 2 (TAT 6-24 HRS) Nasopharyngeal Nasopharyngeal Swab     Status: None   Collection Time: 01/01/19  6:15 AM   Specimen: Nasopharyngeal Swab  Result Value Ref Range Status   SARS Coronavirus 2 NEGATIVE NEGATIVE Final    Comment: (NOTE) SARS-CoV-2 target nucleic acids are NOT DETECTED. The SARS-CoV-2 RNA is generally detectable in upper and lower respiratory specimens during the acute phase of infection. Negative results do not preclude SARS-CoV-2 infection, do not rule out co-infections with other pathogens, and should not be used as the sole basis for treatment or other patient management decisions. Negative results must be combined with clinical observations, patient history, and epidemiological information. The expected result is Negative. Fact Sheet for Patients: HairSlick.no Fact Sheet for Healthcare Providers: quierodirigir.com This test is not yet approved or cleared by the [place] FDA and  has  been authorized for detection and/or diagnosis of SARS-CoV-2 by FDA under an Emergency Use Authorization (EUA). This EUA will remain  in effect (meaning this test can be used) for the duration of the COVID-19 declaration under Section 56 4(b)(1) of the Act, 21 U.S.C. section 360bbb-3(b)(1), unless the authorization is terminated or revoked sooner. Performed at [facility], [address] [place], [place] [address]          Radiology Studies: CT ANGIO CHEST PE W OR WO CONTRAST  Result Date: 01/01/2019 CLINICAL DATA:  Elevated D-dimer with shortness  of breath. EXAM: CT ANGIOGRAPHY CHEST WITH CONTRAST TECHNIQUE: Multidetector CT imaging of the chest was performed using the standard protocol during bolus administration of intravenous contrast. Multiplanar CT image reconstructions and MIPs were obtained to evaluate the vascular anatomy. CONTRAST:  75mL OMNIPAQUE IOHEXOL 350 MG/ML SOLN COMPARISON:  None. FINDINGS: Cardiovascular: Contrast injection is sufficient to demonstrate satisfactory opacification of the pulmonary arteries to the segmental level. There is no pulmonary embolus. The main pulmonary artery is within normal limits for size. There is no CT evidence of acute right heart strain. There are atherosclerotic changes of the visualized aorta. Heart size is mildly enlarged. There is no significant pericardial effusion. Coronary artery calcifications are noted. Mediastinum/Nodes: --No mediastinal or hilar lymphadenopathy. --No axillary lymphadenopathy. --No supraclavicular lymphadenopathy. --Normal thyroid gland. --The esophagus is unremarkable Lungs/Pleura: There are multifocal areas of consolidation involving the right middle lobe, lingula, and bilateral lower lobes. The greatest area of involvement is at the left lower lobe. The trachea is unremarkable. There is no pneumothorax. There are trace bilateral pleural effusions. Upper Abdomen: See separate recent CT report for complete details of the upper abdomen. Musculoskeletal: No chest wall abnormality. No acute or significant osseous findings. Review of the MIP images confirms the above findings. IMPRESSION: 1. No evidence for pulmonary embolus. 2. Multifocal areas of consolidation as detailed above consistent with multifocal pneumonia. 3. Trace bilateral pleural effusions. Aortic Atherosclerosis (ICD10-I70.0). Electronically Signed   By: Katherine Mantlehristopher  Green M.D.   On: 01/01/2019 00:05   CT ABDOMEN PELVIS W CONTRAST  Result Date: 12/31/2018 CLINICAL DATA:  Lambert ModySharp lower abdominal pain beginning  approximately 3 days ago, also with nausea, vomiting and diarrhea. Elevated lipase. EXAM: CT ABDOMEN AND PELVIS WITH CONTRAST TECHNIQUE: Multidetector CT imaging of the abdomen and pelvis was performed using the standard protocol following bolus administration of intravenous contrast. CONTRAST:  75mL OMNIPAQUE IOHEXOL 300 MG/ML  SOLN COMPARISON:  None. FINDINGS: Lower chest: There is consolidation in the medial base of the right middle lobe with hazy ground-glass opacities noted in both lower lobes and the base of the left upper lobe lingula and smaller areas of more dense consolidation also noted in the lower lobes, left greater than right. Findings consistent with multifocal pneumonia. Hepatobiliary: Liver normal in size and attenuation. 7 mm low-attenuation at the dome of the left lobe consistent with a cyst. Small amount of focal fat adjacent to the falciform ligament. 8 mm lesion with peripheral enhancement at the dome of the right lobe consistent with a hemangioma. Calcification along the dome of the right lobe. No other liver abnormalities. Dependent gallstones. Gallbladder wall appears slightly prominent, but not overtly thickened. There is no adjacent inflammation. No bile duct dilation. Pancreas: There is peripancreatic fluid attenuation consistent with inflammation, extends the root of the small bowel mesentery and along the left anterior pararenal fascia. The pancreatic tail appears mildly enlarged an area of heterogeneous attenuation, but no defined mass or  cyst. Portal vein, splenic vein and superior mesenteric veins are widely patent. Spleen: Normal in size without focal abnormality. Adrenals/Urinary Tract: 16 mm left adrenal nodule. Relative washout is 28%, indeterminate. No right adrenal mass. Kidneys normal in size, orientation and position. Symmetric renal enhancement and excretion. 1 cm midpole right renal cyst. No other masses, no stones and no hydronephrosis. Normal ureters. Bladder is  unremarkable. Stomach/Bowel: Stomach is unremarkable. Small bowel and colon are normal in caliber. No wall thickening. No inflammation. Normal appendix. Vascular/Lymphatic: Aortic atherosclerosis. No aneurysm. No enlarged lymph nodes. Reproductive: Uterus and bilateral adnexa are unremarkable. Other: Trace amount of free fluid in the pelvis. Musculoskeletal: No fracture or acute finding.  No bone lesion. IMPRESSION: 1. Acute pancreatitis. Area of heterogeneous enlargement in the tail of pancreas may reflect early pseudocyst formation. Small area of necrosis is felt less likely. No other evidence suggest necrosis. No formed pseudo cyst or evidence of an abscess. No venous thrombosis. 2. Lung base opacities consistent with multifocal infection. Recommend assessing for COVID-19 infection. 3. No other acute abnormalities. 4. Small gallstones. 5. Aortic atherosclerosis. Electronically Signed   By: Amie Portland M.D.   On: 12/31/2018 17:49   Portable chest 1 View  Result Date: 01/01/2019 CLINICAL DATA:  Bilateral pulmonary infiltrates EXAM: PORTABLE CHEST 1 VIEW COMPARISON:  CT PE study from the same day FINDINGS: Bilateral pulmonary opacities are again noted. There is no pneumothorax. No large pleural effusion. The heart size is enlarged. There is no acute osseous abnormality. IMPRESSION: Bilateral pulmonary opacities as seen on recent CT of the chest, consistent with multifocal pneumonia. Electronically Signed   By: Katherine Mantle M.D.   On: 01/01/2019 00:49        Scheduled Meds: . aspirin EC  81 mg Oral Daily  . enoxaparin (LOVENOX) injection  40 mg Subcutaneous Q24H  . irbesartan  300 mg Oral Daily  . metoprolol tartrate  25 mg Oral BID  . nitroGLYCERIN  1 inch Topical Q8H  . polyethylene glycol  17 g Oral Daily  . potassium chloride  40 mEq Oral BID   Continuous Infusions: . azithromycin 500 mg (01/02/19 1031)  . cefTRIAXone (ROCEPHIN)  IV 2 g (01/02/19 0930)    Assessment & Plan:     Active Problems:   Essential hypertension   Pancreatitis   Gallstone   Multifocal pneumonia   1. Acute Pancreatitis - most likely passed a ?allstone with cholelithiasis on CT but no CBD dilatation. CT with small gallstones, LFT up , now improving.  Initial Lipase from 3,922  Trending down Continue IVF D5 1/2NS @ 122ml/hr until eating more Pain control Advance to soft diet tonight -Fasting lipid panel shows triglyceride of 254 We will consult general surgery for questionable early pseudocyst and gallstone   2. Multifocal PNA -on CT Clinically doing fine, asx, and on RA. covid negative Will start iv abx for PNA    3. HTN -labile  - elevated BP in ED. Patiend has missed meds for several days plus pain. -Resumed home meds-need clarification if she takes irbesartan metoprolol 25 mg twice daily was added -Also as needed IVhydralazine and labetalol with parameters given  4.  Hypokalemia-we will replace aggressively  DVT prophylaxis: lovnenox Code Status:full Family Communication: None at bedside Disposition Plan: Likely be here 1-2 more days until medically stable       LOS: 1 day   Time spent: 45 minutes with more than 50% on COC    Lynn Ito, MD [facility] Pager  [phone]  If 7PM-7AM, please contact night-coverage [email or web address] Password [email or web address] 01/02/2019, 12:22 PM

## 2019-01-02 NOTE — Plan of Care (Signed)

## 2019-01-02 NOTE — Consult Note (Signed)
SURGICAL CONSULTATION NOTE   HISTORY OF PRESENT ILLNESS (HPI):  83 y.o. female presented to [facility] ED for evaluation of abdominal pain. Patient reports she started with abdominal pain 2 days prior to her admission to the hospital.  The pain was on her mid abdomen.  The pain radiated to her back.  There was no alleviating or aggravating factor.  She endorses some nausea.  She denies any chest pain.  She denies any cough.  She denies any shortness of breath.  She denies any fever chills.  Evaluation in the hospital patient had labs that shows lipase that went up to 3922.  Today lipase has come down to 29.  Patient also with low potassium today.  AST ALT initially elevated.  Slowly decreasing.  Interestingly the total bilirubin has been increasing from 1.6 to 2.2.  Due to the severity of the pain CT of the abdomen was done.  This shows cholelithiasis.  His ultrasound shows inflammation around the pancreas.  Today at the moment of my evaluation patient with significant pain.  She described the pain 9 out of 10.  Pain on the paramedical area.  Pain has been able to be controlled with current pain medication.  Surgery is consulted by Dr. Kurtis Bushman in this context for evaluation and management of gallstone pancreatitis.  PAST MEDICAL HISTORY (PMH):  Past Medical History:  Diagnosis Date  . Essential hypertension 07/29/2016  . History of vertigo 07/29/2016  . Hyperlipidemia 07/29/2016  . Osteoarthritis of back 07/29/2016  . Osteopenia of multiple sites 07/29/2016  . Seasonal allergies 07/29/2016  . Urinary, incontinence, stress female 07/29/2016     PAST SURGICAL HISTORY [facility]):  Past Surgical History:  Procedure Laterality Date  . no past surgery       MEDICATIONS:  Prior to Admission medications   Medication Sig Start Date End Date Taking? Authorizing Provider  aspirin 500 MG tablet Take 500 mg by mouth every 6 (six) hours as needed for pain.   Yes [provider]  aspirin 81 MG tablet Take  81 mg by mouth daily.   Yes [provider]  irbesartan (AVAPRO) 300 MG tablet Take 1 tablet by mouth once daily 10/25/18  Yes Mikey College, NP     ALLERGIES:  Allergies  Allergen Reactions  . Lisinopril Cough  . Statins      SOCIAL HISTORY:  Social History   Socioeconomic History  . Marital status: Married    Spouse name: Not on file  . Number of children: Not on file  . Years of education: Not on file  . Highest education level: 12th grade  Occupational History  . Not on file  Tobacco Use  . Smoking status: Never Smoker  . Smokeless tobacco: Never Used  Substance and Sexual Activity  . Alcohol use: No  . Drug use: No  . Sexual activity: Not Currently    Birth control/protection: None, Post-menopausal  Other Topics Concern  . Not on file  Social History Narrative  . Not on file   Social Determinants of Health   Financial Resource Strain:   . Difficulty of Paying Living Expenses: Not on file  Food Insecurity:   . Worried About Charity fundraiser in the Last Year: Not on file  . Ran Out of Food in the Last Year: Not on file  Transportation Needs:   . Lack of Transportation (Medical): Not on file  . Lack of Transportation (Non-Medical): Not on file  Physical Activity:   .  Days of Exercise per Week: Not on file  . Minutes of Exercise per Session: Not on file  Stress:   . Feeling of Stress : Not on file  Social Connections:   . Frequency of Communication with Friends and Family: Not on file  . Frequency of Social Gatherings with Friends and Family: Not on file  . Attends Religious Services: Not on file  . Active Member of Clubs or Organizations: Not on file  . Attends Banker Meetings: Not on file  . Marital Status: Not on file  Intimate Partner Violence:   . Fear of Current or Ex-Partner: Not on file  . Emotionally Abused: Not on file  . Physically Abused: Not on file  . Sexually Abused: Not on file    The patient currently  resides (home / rehab facility / nursing home): Home The patient normally is (ambulatory / bedbound): Ambulatory   FAMILY HISTORY:  Family History  Problem Relation Age of Onset  . Breast cancer Sister   . Cancer Sister 46       breast cancer  . Alzheimer's disease Mother   . Healthy Father      REVIEW OF SYSTEMS:  Constitutional: denies weight loss, fever, chills, or sweats  Eyes: denies any other vision changes, history of eye injury  ENT: denies sore throat, hearing problems  Respiratory: denies shortness of breath, wheezing  Cardiovascular: denies chest pain, palpitations  Gastrointestinal: Positive abdominal pain, N/V Genitourinary: denies burning with urination or urinary frequency Musculoskeletal: denies any other joint pains or cramps  Skin: denies any other rashes or skin discolorations  Neurological: denies any other headache, dizziness, weakness  Psychiatric: denies any other depression, anxiety   All other review of systems were negative   VITAL SIGNS:  Temp:  [98.3 F (36.8 C)-98.9 F (37.2 C)] 98.9 F (37.2 C) (12/15 1630) Pulse Rate:  [64-90] 90 (12/15 1630) Resp:  [18-20] 18 (12/15 1630) BP: (166-187)/(73-80) 178/78 (12/15 1630) SpO2:  [94 %-97 %] 94 % (12/15 1630)     Height: 4\' 11"  (149.9 cm) Weight: 47.2 kg BMI (Calculated): 21.01   INTAKE/OUTPUT:  This shift: Total I/O In: 780 [P.O.:780] Out: -   Last 2 shifts: @IOLAST2SHIFTS @   PHYSICAL EXAM:  Constitutional:  -- Normal body habitus  -- Awake, alert, and oriented x3  Eyes:  -- Pupils equally round and reactive to light  -- No scleral icterus  Ear, nose, and throat:  -- No jugular venous distension  Pulmonary:  -- No crackles  -- Equal breath sounds bilaterally -- Breathing non-labored at rest Cardiovascular:  -- S1, S2 present  -- No pericardial rubs Gastrointestinal:  -- Abdomen soft, nontender, non-distended, no guarding or rebound tenderness -- No abdominal masses appreciated,  pulsatile or otherwise  Musculoskeletal and Integumentary:  -- Wounds or skin discoloration: None appreciated -- Extremities: B/L UE and LE FROM, hands and feet warm, no edema  Neurologic:  -- Motor function: intact and symmetric -- Sensation: intact and symmetric   Labs:  CBC Latest Ref Rng & Units 01/02/2019 12/31/2018 12/29/2018  WBC 4.0 - 10.5 K/uL 10.6(H) 10.9(H) 14.9(H)  Hemoglobin 12.0 - 15.0 g/dL 01/02/2019 15.1(H) 15.3(H)  Hematocrit 36.0 - 46.0 % 37.6 47.0(H) 47.3(H)  Platelets 150 - 400 K/uL 164 199 254   CMP Latest Ref Rng & Units 01/02/2019 01/01/2019 12/31/2018  Glucose 70 - 99 mg/dL 90 95 01/03/2019)  BUN 8 - 23 mg/dL 21 01/02/2019) 185(U)  Creatinine 0.44 - 1.00 mg/dL 31(S 97(W  1.01(H)  Sodium 135 - 145 mmol/L 139 139 138  Potassium 3.5 - 5.1 mmol/L 2.8(L) 3.8 3.3(L)  Chloride 98 - 111 mmol/L 106 107 99  CO2 22 - 32 mmol/L 17(L) 22 25  Calcium 8.9 - 10.3 mg/dL 7.7(L) 7.0(L) 8.4(L)  Total Protein 6.5 - 8.1 g/dL 6.8 0.9(W6.2(L) 8.1  Total Bilirubin 0.3 - 1.2 mg/dL 2.2(H) 2.2(H) 1.7(H)  Alkaline Phos 38 - 126 U/L 76 62 86  AST 15 - 41 U/L 43(H) 48(H) 59(H)  ALT 0 - 44 U/L 46(H) 57(H) 95(H)   Lipase: 29  Imaging studies:  EXAM: CT ABDOMEN AND PELVIS WITH CONTRAST  TECHNIQUE: Multidetector CT imaging of the abdomen and pelvis was performed using the standard protocol following bolus administration of intravenous contrast.  CONTRAST:  75mL OMNIPAQUE IOHEXOL 300 MG/ML  SOLN  COMPARISON:  None.  FINDINGS: Lower chest: There is consolidation in the medial base of the right middle lobe with hazy ground-glass opacities noted in both lower lobes and the base of the left upper lobe lingula and smaller areas of more dense consolidation also noted in the lower lobes, left greater than right. Findings consistent with multifocal pneumonia.  Hepatobiliary: Liver normal in size and attenuation. 7 mm low-attenuation at the dome of the left lobe consistent with a cyst. Small amount of  focal fat adjacent to the falciform ligament. 8 mm lesion with peripheral enhancement at the dome of the right lobe consistent with a hemangioma. Calcification along the dome of the right lobe. No other liver abnormalities.  Dependent gallstones. Gallbladder wall appears slightly prominent, but not overtly thickened. There is no adjacent inflammation. No bile duct dilation.  Pancreas: There is peripancreatic fluid attenuation consistent with inflammation, extends the root of the small bowel mesentery and along the left anterior pararenal fascia. The pancreatic tail appears mildly enlarged an area of heterogeneous attenuation, but no defined mass or cyst.  Portal vein, splenic vein and superior mesenteric veins are widely patent.  Spleen: Normal in size without focal abnormality.  Adrenals/Urinary Tract: 16 mm left adrenal nodule. Relative washout is 28%, indeterminate. No right adrenal mass.  Kidneys normal in size, orientation and position. Symmetric renal enhancement and excretion. 1 cm midpole right renal cyst. No other masses, no stones and no hydronephrosis. Normal ureters. Bladder is unremarkable.  Stomach/Bowel: Stomach is unremarkable. Small bowel and colon are normal in caliber. No wall thickening. No inflammation. Normal appendix.  Vascular/Lymphatic: Aortic atherosclerosis. No aneurysm. No enlarged lymph nodes.  Reproductive: Uterus and bilateral adnexa are unremarkable.  Other: Trace amount of free fluid in the pelvis.  Musculoskeletal: No fracture or acute finding.  No bone lesion.  IMPRESSION: 1. Acute pancreatitis. Area of heterogeneous enlargement in the tail of pancreas may reflect early pseudocyst formation. Small area of necrosis is felt less likely. No other evidence suggest necrosis. No formed pseudo cyst or evidence of an abscess. No venous thrombosis. 2. Lung base opacities consistent with multifocal infection. Recommend assessing for  COVID-19 infection. 3. No other acute abnormalities. 4. Small gallstones. 5. Aortic atherosclerosis.   Electronically Signed   By: Amie Portlandavid  Ormond M.D.   On: 12/31/2018 17:49  Assessment/Plan:  83 y.o. female with calcium pancreatitis, complicated by pertinent comorbidities including pneumonia by imaging but not clinically, hypertension, hyperlipidemia.  Patient currently with significant pain.  This is conservative for now resolving tachycardic despite improvement of the lipase.  I would recommend to decrease patient diet to clear liquid due to the severity of the pain during my  evaluation.  I agree that the patient was improving earlier and advancing the diet was appropriate but at this moment the patient is very uncomfortable with her abdominal pain decreasing the diet to clear liquids should be considered.  I will also order an MRI of the abdomen (MRCP) due to persistent elevation of the bilirubin to rule out any choledocholithiasis.  I will keep following bilirubin trend and may be she will need another lipase level to make sure that the pancreatitis has not recurred.  Also patient will need clearance due to suspected pneumonia.  Patient is not coughing but images concerning for multi focal.  The patient improved her abdominal pain, there is no choledocholithiasis and stable from the suspected pneumonia, cholecystectomy during admission will be considered.  I will follow the MRI results and follow labs and also follow patient clinically for further recommendations.   Gae Gallop, MD

## 2019-01-02 NOTE — Progress Notes (Signed)
Initial Nutrition Assessment  DOCUMENTATION CODES:   Non-severe (moderate) malnutrition in context of social or environmental circumstances  INTERVENTION:  Provide Ensure Enlive po TID, each supplement provides 350 kcal and 20 grams of protein.   NUTRITION DIAGNOSIS:   Moderate Malnutrition related to social / environmental circumstances(inadequate oral intake) as evidenced by moderate fat depletion, moderate muscle depletion.  GOAL:   Patient will meet greater than or equal to 90% of their needs  MONITOR:   PO intake, Supplement acceptance, Labs, Weight trends, Diet advancement, I & O's  REASON FOR ASSESSMENT:   Malnutrition Screening Tool    ASSESSMENT:   83 year old female with PMHx of HLD, osteopenia, HTN admitted with acute pancreatitis, multifocal PNA.   Met with patient and her husband at bedside. Patient reports she has had poor PO intake since 12/9 in setting of abdominal pain and nausea. At home she reports she has not had anything to eat since then but has taken small sips of water. She is unable to report her usual intake prior to onset of abdominal pain as she is not feeling well. She reports she has not been trying much of her liquid diet here. She has only taken in small sips and some ice chips. Diet has been advanced to soft today but patient reports she will not be able to eat any solid foods today. She is amenable to trying small sips of an oral nutrition shake and increasing intake as her nausea improves.   Patient reports her UBW was around 112 lbs. She is unsure when she started losing weight. She is now 47.2 kg (104.06 lbs). It appears she weighed 52.9 kg in 2018.  Medications reviewed and include: Miralax 17 grams daily, potassium chloride 40 mEq BID, azithromycin, ceftriaxone, D5-1/2NS at 100 ml/hr.  Labs reviewed: Potassium 2.8, CO2 17, Anion gap 16.  NUTRITION - FOCUSED PHYSICAL EXAM:    Most Recent Value  Orbital Region  Moderate depletion  Upper  Arm Region  Moderate depletion  Thoracic and Lumbar Region  Mild depletion  Buccal Region  Moderate depletion  Temple Region  Moderate depletion  Clavicle Bone Region  Moderate depletion  Clavicle and Acromion Bone Region  Moderate depletion  Scapular Bone Region  Unable to assess  Dorsal Hand  Moderate depletion  Patellar Region  Mild depletion  Anterior Thigh Region  Mild depletion  Posterior Calf Region  Moderate depletion  Edema (RD Assessment)  None  Hair  Reviewed  Eyes  Reviewed  Mouth  Reviewed  Skin  Reviewed  Nails  Reviewed     Diet Order:   Diet Order            DIET SOFT Room service appropriate? Yes; Fluid consistency: Thin  Diet effective now             EDUCATION NEEDS:   Not appropriate for education at this time  Skin:  Skin Assessment: Reviewed RN Assessment  Last BM:  12/28/2018 per chart  Height:   Ht Readings from Last 1 Encounters:  12/31/18 4' 11" (1.499 m)   Weight:   Wt Readings from Last 1 Encounters:  12/31/18 47.2 kg   Ideal Body Weight:  44.7 kg  BMI:  Body mass index is 21.02 kg/m.  Estimated Nutritional Needs:   Kcal:  1400-1600  Protein:  70-80 grams  Fluid:  1.4-1.6 L/day  Jacklynn Barnacle, MS, RD, LDN Office: [phone] Pager: [phone] After Hours/Weekend Pager: [phone]

## 2019-01-03 DIAGNOSIS — E440 Moderate protein-calorie malnutrition: Secondary | ICD-10-CM

## 2019-01-03 LAB — BASIC METABOLIC PANEL
Anion gap: 11 (ref 5–15)
BUN: 12 mg/dL (ref 8–23)
CO2: 20 mmol/L — ABNORMAL LOW (ref 22–32)
Calcium: 7.6 mg/dL — ABNORMAL LOW (ref 8.9–10.3)
Chloride: 106 mmol/L (ref 98–111)
Creatinine, Ser: 0.51 mg/dL (ref 0.44–1.00)
GFR calc Af Amer: >60 mL/min (ref >60)
GFR calc non Af Amer: >60 mL/min (ref >60)
Glucose, Bld: 179 mg/dL — ABNORMAL HIGH (ref 70–99)
Potassium: 3.4 mmol/L — ABNORMAL LOW (ref 3.5–5.1)
Sodium: 137 mmol/L (ref 135–145)

## 2019-01-03 LAB — HEPATIC FUNCTION PANEL
ALT: 44 U/L (ref 0–44)
AST: 40 U/L (ref 15–41)
Albumin: 2.7 g/dL — ABNORMAL LOW (ref 3.5–5.0)
Alkaline Phosphatase: 83 U/L (ref 38–126)
Bilirubin, Direct: 0.4 mg/dL — ABNORMAL HIGH (ref 0.0–0.2)
Indirect Bilirubin: 1.3 mg/dL — ABNORMAL HIGH (ref 0.3–0.9)
Total Bilirubin: 1.7 mg/dL — ABNORMAL HIGH (ref 0.3–1.2)
Total Protein: 6.3 g/dL — ABNORMAL LOW (ref 6.5–8.1)

## 2019-01-03 LAB — LIPASE, BLOOD: Lipase: 33 U/L (ref 11–51)

## 2019-01-03 MED ORDER — BOOST / RESOURCE BREEZE PO LIQD CUSTOM
1.0000 | Freq: Three times a day (TID) | ORAL | Status: DC
  Administered 2019-01-03 – 2019-01-06 (×3): 1 via ORAL

## 2019-01-03 MED ORDER — METOPROLOL TARTRATE 50 MG PO TABS
50.0000 mg | ORAL_TABLET | Freq: Two times a day (BID) | ORAL | Status: DC
  Administered 2019-01-03 – 2019-01-06 (×5): 50 mg via ORAL
  Filled 2019-01-03 (×5): qty 1

## 2019-01-03 NOTE — Progress Notes (Signed)
PROGRESS NOTE    Madeline Taylor  AVW:[id number] DOB: 1935-11-19 DOA: 12/31/2018 PCP: Galen Manila, NP (Inactive)    Brief Narrative:  2 days ago, Due to persistent symptoms she presented to [facility] for evaluation. She left before lab results returned which did reveal an lipase of 3,922! She returns today for persistent pain with N/V and increasing weakness. She describes the pain as severe, piercing to the back. She has not been able to eat and has taken in very little fluid. No prior GI history and no knolwedge of having Gallstones. Lab revealed mild AKI with Cr rising from 0.89 to 101 and BUN rising from 23 to 49. CT abd/pelvis reveals cholelithiasis w/o cholecystitis, no CBD dilatation, inflammation of the pancreas with swelling of the pancreatic tail w/o cyst or mass. Scan did reveal multi-focal ground glass infiltrates suggestive of viral/covid infection.     Consultants:   none  Procedures:  CT abdomen and pelvis IMPRESSION: 1. Acute pancreatitis. Area of heterogeneous enlargement in the tail of pancreas may reflect early pseudocyst formation. Small area of necrosis is felt less likely. No other evidence suggest necrosis. No formed pseudo cyst or evidence of an abscess. No venous thrombosis. 2. Lung base opacities consistent with multifocal infection. Recommend assessing for COVID-19 infection. 3. No other acute abnormalities. 4. Small gallstones. 5. Aortic atherosclerosis   CT angio chest IMPRESSION: 1. No evidence for pulmonary embolus. 2. Multifocal areas of consolidation as detailed above consistent with multifocal pneumonia. 3. Trace bilateral pleural effusions.  Aortic Atherosclerosis (ICD10-I70.0).  Antimicrobials:   None   Subjective: She reports pain of 2 out of 10, does not want to eat as she is afraid of pain getting worse.  Tolerating clear liquid, blood pressure elevated Objective: Vitals:   01/03/19 0016 01/03/19 0220 01/03/19 0613  01/03/19 0752  BP: (!) 197/90 (!) 182/84 (!) 192/80 (!) 178/83  Pulse: 79 86 83 93  Resp:      Temp: 98.4 F (36.9 C)   98.7 F (37.1 C)  TempSrc: Oral   Oral  SpO2: 96%   95%  Weight:      Height:        Intake/Output Summary (Last 24 hours) at 01/03/2019 1354 Last data filed at 01/03/2019 0500 Gross per 24 hour  Intake 1333.04 ml  Output --  Net 1333.04 ml   Filed Weights   12/31/18 1116  Weight: 47.2 kg    Examination:  General exam: Appears calm and comfortable NAD Respiratory system: Clear to auscultation. Respiratory effort normal. Cardiovascular system: S1 & S2 heard, RRR. No JVD, murmurs, rubs, gallops or clicks. Gastrointestinal system: Abdomen is nondistended, soft and nontender. Normal bowel sounds heard. Central nervous system: Alert and oriented. No focal neurological deficits. Extremities: No edema Skin: Warm and dry Psychiatry: Judgement and insight appear normal. Mood & affect appropriate.     Data Reviewed: I have personally reviewed following labs and imaging studies  CBC: Recent Labs  Lab 12/29/18 2053 12/31/18 1118 01/02/19 0805  WBC 14.9* 10.9* 10.6*  HGB 15.3* 15.1* 12.5  HCT 47.3* 47.0* 37.6  MCV 90.8 90.9 90.6  PLT 254 199 164   Basic Metabolic Panel: Recent Labs  Lab 12/29/18 2053 12/31/18 1118 01/01/19 0413 01/02/19 0805 01/03/19 0230  NA 143 138 139 139 137  K 3.0* 3.3* 3.8 2.8* 3.4*  CL 105 99 107 106 106  CO2 17* 20*  GLUCOSE 176* 127* 95 90 179*  BUN 23 42* 28* 21  12  CREATININE 0.82 1.01* 0.62 0.53 0.51  CALCIUM 9.5 8.4* 7.0* 7.7* 7.6*   GFR: Estimated Creatinine Clearance: 37.6 mL/min (by C-G formula based on SCr of 0.51 mg/dL). Liver Function Tests: Recent Labs  Lab 12/29/18 2053 12/31/18 1118 01/01/19 0413 01/02/19 0805 01/03/19 0230  AST 399* 59* 48* 43* 40  ALT 225* 95* 57* 46* 44  ALKPHOS 139* 86 62 76 83  BILITOT 1.6* 1.7* 2.2* 2.2* 1.7*  PROT 7.9 8.1 6.2* 6.8 6.3*  ALBUMIN 4.3 4.2 3.1*  3.0* 2.7*   Recent Labs  Lab 12/29/18 2053 12/31/18 1118 01/01/19 0413 01/02/19 0805 01/03/19 0230  LIPASE 3,922* 322* 92* 29 33   Sepsis Labs: Recent Labs  Lab 12/31/18 1118  PROCALCITON 25.26    Recent Results (from the past 240 hour(s))  SARS CORONAVIRUS 2 (TAT 6-24 HRS) Nasopharyngeal Nasopharyngeal Swab     Status: None   Collection Time: 12/31/18  7:42 PM   Specimen: Nasopharyngeal Swab  Result Value Ref Range Status   SARS Coronavirus 2 NEGATIVE NEGATIVE Final    Comment: (NOTE) SARS-CoV-2 target nucleic acids are NOT DETECTED. The SARS-CoV-2 RNA is generally detectable in upper and lower respiratory specimens during the acute phase of infection. Negative results do not preclude SARS-CoV-2 infection, do not rule out co-infections with other pathogens, and should not be used as the sole basis for treatment or other patient management decisions. Negative results must be combined with clinical observations, patient history, and epidemiological information. The expected result is Negative. Fact Sheet for Patients: SugarRoll.be Fact Sheet for Healthcare Providers: https://www.woods-mathews.com/ This test is not yet approved or cleared by the [place] FDA and  has been authorized for detection and/or diagnosis of SARS-CoV-2 by FDA under an Emergency Use Authorization (EUA). This EUA will remain  in effect (meaning this test can be used) for the duration of the COVID-19 declaration under Section 56 4(b)(1) of the Act, 21 U.S.C. section 360bbb-3(b)(1), unless the authorization is terminated or revoked sooner. Performed at [facility], [place] [address], [place], [place] [address]   SARS CORONAVIRUS 2 (TAT 6-24 HRS) Nasopharyngeal Nasopharyngeal Swab     Status: None   Collection Time: 01/01/19  6:15 AM   Specimen: Nasopharyngeal Swab  Result Value Ref Range Status   SARS Coronavirus 2 NEGATIVE NEGATIVE Final     Comment: (NOTE) SARS-CoV-2 target nucleic acids are NOT DETECTED. The SARS-CoV-2 RNA is generally detectable in upper and lower respiratory specimens during the acute phase of infection. Negative results do not preclude SARS-CoV-2 infection, do not rule out co-infections with other pathogens, and should not be used as the sole basis for treatment or other patient management decisions. Negative results must be combined with clinical observations, patient history, and epidemiological information. The expected result is Negative. Fact Sheet for Patients: SugarRoll.be Fact Sheet for Healthcare Providers: https://www.woods-mathews.com/ This test is not yet approved or cleared by the [place] FDA and  has been authorized for detection and/or diagnosis of SARS-CoV-2 by FDA under an Emergency Use Authorization (EUA). This EUA will remain  in effect (meaning this test can be used) for the duration of the COVID-19 declaration under Section 56 4(b)(1) of the Act, 21 U.S.C. section 360bbb-3(b)(1), unless the authorization is terminated or revoked sooner. Performed at [facility], [place] [address] [place], [place] [address]          Radiology Studies: MR 3D Recon At Scanner  Result Date: 01/03/2019 CLINICAL DATA:  Abdominal pain for  2 days. Nausea. Cholelithiasis. Acute pancreatitis. EXAM: MRI ABDOMEN WITHOUT AND WITH CONTRAST (INCLUDING MRCP) TECHNIQUE: Multiplanar multisequence MR imaging of the abdomen was performed both before and after the administration of intravenous contrast. Heavily T2-weighted images of the biliary and pancreatic ducts were obtained, and three-dimensional MRCP images were rendered by post processing. CONTRAST:  4mL GADAVIST GADOBUTROL 1 MMOL/ML IV SOLN COMPARISON:  CT on 12/31/2018 FINDINGS: Lower chest: Mild bibasilar atelectasis. Hepatobiliary: Subcentimeter cyst is seen in the central left hepatic lobe. A 1 cm  lesion is also seen in the dome of the right lobe which shows T1 hypointensity, T2 isointensity, and no significant contrast enhancement. This appears to show calcifications on recent CT, and is an indeterminate but probably benign lesion. No evidence of steatosis. Tiny gallstones are better visualized on recent CT. No evidence of cholecystitis. No evidence of biliary ductal dilatation with common bile duct measuring 5 mm in diameter. No evidence of choledocholithiasis or biliary stricture. Pancreas: Mild-to-moderate pancreatic edema is seen which is greatest in the pancreatic tail. Mild peripancreatic inflammatory changes seen with minimal amount of peripancreatic fluid. These findings are consistent with acute pancreatitis. No evidence of pancreatic ductal dilatation or pancreas divisum. No evidence of pancreatic mass or pancreatic necrosis. No pseudocysts identified. Spleen:  Within normal limits in size and appearance. Adrenals/Urinary Tract: A 1.8 cm homogeneous left adrenal mass is seen, however no signal dropout is seen on chemical shift imaging. Small right renal cyst is noted. No evidence of renal mass or hydronephrosis. Stomach/Bowel: Visualized portion unremarkable. Vascular/Lymphatic: No pathologically enlarged lymph nodes identified. No abdominal aortic aneurysm. Other:  None. Musculoskeletal:  No suspicious bone lesions identified. IMPRESSION: 1. Mild to moderate acute pancreatitis. No evidence of pancreatic necrosis or pseudocyst. 2. Cholelithiasis. No radiographic evidence of cholecystitis. 3. No evidence of biliary ductal dilatation or choledocholithiasis. 4. 1.8 cm indeterminate left renal mass. This most likely represents a benign adenoma in patient without history of cancer. Recommend continued follow-up by MRI in 12 months to confirm stability. This recommendation follows ACR consensus guidelines: Management of Incidental Adrenal Masses: A White Paper of the ACR Incidental Findings Committee. J  Am Coll Radiol 2017;14:1038-1044. 5. 1.8 cm indeterminate right lobe lesion, likely benign. Recommend continued attention on follow-up MRI in 12 months. Electronically Signed   By: Danae OrleansJohn A Stahl M.D.   On: 01/03/2019 08:20   MR ABDOMEN MRCP W WO CONTAST  Result Date: 01/03/2019 CLINICAL DATA:  Abdominal pain for 2 days. Nausea. Cholelithiasis. Acute pancreatitis. EXAM: MRI ABDOMEN WITHOUT AND WITH CONTRAST (INCLUDING MRCP) TECHNIQUE: Multiplanar multisequence MR imaging of the abdomen was performed both before and after the administration of intravenous contrast. Heavily T2-weighted images of the biliary and pancreatic ducts were obtained, and three-dimensional MRCP images were rendered by post processing. CONTRAST:  4mL GADAVIST GADOBUTROL 1 MMOL/ML IV SOLN COMPARISON:  CT on 12/31/2018 FINDINGS: Lower chest: Mild bibasilar atelectasis. Hepatobiliary: Subcentimeter cyst is seen in the central left hepatic lobe. A 1 cm lesion is also seen in the dome of the right lobe which shows T1 hypointensity, T2 isointensity, and no significant contrast enhancement. This appears to show calcifications on recent CT, and is an indeterminate but probably benign lesion. No evidence of steatosis. Tiny gallstones are better visualized on recent CT. No evidence of cholecystitis. No evidence of biliary ductal dilatation with common bile duct measuring 5 mm in diameter. No evidence of choledocholithiasis or biliary stricture. Pancreas: Mild-to-moderate pancreatic edema is seen which is greatest in the pancreatic tail. Mild peripancreatic  inflammatory changes seen with minimal amount of peripancreatic fluid. These findings are consistent with acute pancreatitis. No evidence of pancreatic ductal dilatation or pancreas divisum. No evidence of pancreatic mass or pancreatic necrosis. No pseudocysts identified. Spleen:  Within normal limits in size and appearance. Adrenals/Urinary Tract: A 1.8 cm homogeneous left adrenal mass is seen,  however no signal dropout is seen on chemical shift imaging. Small right renal cyst is noted. No evidence of renal mass or hydronephrosis. Stomach/Bowel: Visualized portion unremarkable. Vascular/Lymphatic: No pathologically enlarged lymph nodes identified. No abdominal aortic aneurysm. Other:  None. Musculoskeletal:  No suspicious bone lesions identified. IMPRESSION: 1. Mild to moderate acute pancreatitis. No evidence of pancreatic necrosis or pseudocyst. 2. Cholelithiasis. No radiographic evidence of cholecystitis. 3. No evidence of biliary ductal dilatation or choledocholithiasis. 4. 1.8 cm indeterminate left renal mass. This most likely represents a benign adenoma in patient without history of cancer. Recommend continued follow-up by MRI in 12 months to confirm stability. This recommendation follows ACR consensus guidelines: Management of Incidental Adrenal Masses: A White Paper of the ACR Incidental Findings Committee. J Am Coll Radiol 2017;14:1038-1044. 5. 1.8 cm indeterminate right lobe lesion, likely benign. Recommend continued attention on follow-up MRI in 12 months. Electronically Signed   By: Danae Orleans M.D.   On: 01/03/2019 08:20        Scheduled Meds:  enoxaparin (LOVENOX) injection  40 mg Subcutaneous Q24H   feeding supplement  1 Container Oral TID BM   irbesartan  300 mg Oral Daily   metoprolol tartrate  25 mg Oral BID   nitroGLYCERIN  1 inch Topical Q8H   polyethylene glycol  17 g Oral Daily   potassium chloride  40 mEq Oral BID   Continuous Infusions:  azithromycin 500 mg (01/03/19 1046)   cefTRIAXone (ROCEPHIN)  IV 2 g (01/03/19 0931)   dextrose 5 % and 0.45% NaCl 100 mL/hr at 01/03/19 0019    Assessment & Plan:   Active Problems:   Essential hypertension   Pancreatitis   Gallstone   Multifocal pneumonia   Malnutrition of moderate degree 83 y.o. female with hypertension, hyperlipidemia admitted for acute pancreatitis.  1. Acute gallstone pancreatitis -  most likely passed a Gallstone with cholelithiasis on CT but no CBD dilatation. CT with small gallstones, LFTs improving.  Initial Lipase from 3,922  Trending down 33 Continue IVF D5 1/2NS until eating  Pain control Continue clear liquid diet for now -Fasting lipid panel shows triglyceride of 254 MRCP not showing any choledocholithiasis -Appreciate surgery input.  Likely conservative management -1.8 cm left renal mass seen, likely benign adenoma per radiology   2. Multifocal PNA -on CT Clinically doing fine, asx, and on RA. covid negative Continue IV Rocephin and Zithromax for now  3. HTN -uncontrolled - Continue Avapro - Increase metoprolol to 50 mg twice a day -Also as needed IVhydralazine and labetalol with parameters given  4.  Hypokalemia-replete and recheck, check magnesium  DVT prophylaxis: lovnenox Code Status:full Family Communication: None at bedside Disposition Plan: Likely be here 1-2 more days until medically stable       LOS: 2 days   Time spent: 35 minutes with more than 50% on COC    Delfino Lovett, MD [facility] Pager [phone]  If 7PM-7AM, please contact night-coverage [email or web address] Password [email or web address] 01/03/2019, 1:54 PM

## 2019-01-03 NOTE — Progress Notes (Signed)
[place] Hospital Day(s): 2.   Post op day(s):  Marland Kitchen   Interval History: Patient seen and examined, no acute events or new complaints overnight. Patient reports feeling improved abdominal pain. Pain today is 6/10. Compared to yesterday 9/10. No pain radiation. No alleviating or aggravating factor.   Vital signs in last 24 hours: [min-max] current  Temp:  [98.4 F (36.9 C)-99.9 F (37.7 C)] 99.9 F (37.7 C) (12/16 2009) Pulse Rate:  [79-93] 91 (12/16 2009) Resp:  [18] 18 (12/16 2009) BP: (178-197)/(80-90) 182/90 (12/16 2009) SpO2:  [95 %-96 %] 95 % (12/16 2009)     Height: 4\' 11"  (149.9 cm) Weight: 47.2 kg BMI (Calculated): 21.01    Physical Exam:  Constitutional: alert, cooperative and no distress  Respiratory: breathing non-labored at rest  Cardiovascular: regular rate and sinus rhythm  Gastrointestinal: soft, mild-tender, and non-distended  Labs:  CBC Latest Ref Rng & Units 01/02/2019 12/31/2018 12/29/2018  WBC 4.0 - 10.5 K/uL 10.6(H) 10.9(H) 14.9(H)  Hemoglobin 12.0 - 15.0 g/dL 12.5 15.1(H) 15.3(H)  Hematocrit 36.0 - 46.0 % 37.6 47.0(H) 47.3(H)  Platelets 150 - 400 K/uL 164 199 254   CMP Latest Ref Rng & Units 01/03/2019 01/02/2019 01/01/2019  Glucose 70 - 99 mg/dL 179(H) 90 95  BUN 8 - 23 mg/dL 12 21 28(H)  Creatinine 0.44 - 1.00 mg/dL 0.51 0.53 0.62  Sodium 135 - 145 mmol/L 137 139 139  Potassium 3.5 - 5.1 mmol/L 3.4(L) 2.8(L) 3.8  Chloride 98 - 111 mmol/L 106 106 107  CO2 22 - 32 mmol/L 20(L) 17(L) 22  Calcium 8.9 - 10.3 mg/dL 7.6(L) 7.7(L) 7.0(L)  Total Protein 6.5 - 8.1 g/dL 6.3(L) 6.8 6.2(L)  Total Bilirubin 0.3 - 1.2 mg/dL 1.7(H) 2.2(H) 2.2(H)  Alkaline Phos 38 - 126 U/L 83 76 62  AST 15 - 41 U/L 40 43(H) 48(H)  ALT 0 - 44 U/L 44 46(H) 57(H)    Imaging studies: No new pertinent imaging studies   Assessment/Plan:  83 y.o. 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 Pancreatitis improving clinically today. Pain improved to 6/10. Camila Li will need to improve more before surgery. As discussed with Dr. Kurtis Bushman yesterday, patient currently on antibiotic for multifocal pneumonia. He recommended to days for the antibiotics. I think that this will give time to abdominal pain to improve to take patient to OR for cholecystectomy before discharge. Potassium improved. Bilirubin decreasing. MRI negative for choledocholithiasis. Will continue to follow for cholecystectomy before discharge.   Arnold Long, MD

## 2019-01-04 ENCOUNTER — Inpatient Hospital Stay: Payer: Medicare Other

## 2019-01-04 DIAGNOSIS — K8050 Calculus of bile duct without cholangitis or cholecystitis without obstruction: Secondary | ICD-10-CM

## 2019-01-04 LAB — COMPREHENSIVE METABOLIC PANEL
ALT: 38 U/L (ref 0–44)
AST: 30 U/L (ref 15–41)
Albumin: 2.6 g/dL — ABNORMAL LOW (ref 3.5–5.0)
Alkaline Phosphatase: 90 U/L (ref 38–126)
Anion gap: 8 (ref 5–15)
BUN: 9 mg/dL (ref 8–23)
CO2: 21 mmol/L — ABNORMAL LOW (ref 22–32)
Calcium: 7.9 mg/dL — ABNORMAL LOW (ref 8.9–10.3)
Chloride: 109 mmol/L (ref 98–111)
Creatinine, Ser: 0.48 mg/dL (ref 0.44–1.00)
GFR calc Af Amer: >60 mL/min (ref >60)
GFR calc non Af Amer: >60 mL/min (ref >60)
Glucose, Bld: 155 mg/dL — ABNORMAL HIGH (ref 70–99)
Potassium: 3.8 mmol/L (ref 3.5–5.1)
Sodium: 138 mmol/L (ref 135–145)
Total Bilirubin: 1 mg/dL (ref 0.3–1.2)
Total Protein: 6.1 g/dL — ABNORMAL LOW (ref 6.5–8.1)

## 2019-01-04 LAB — LIPASE, BLOOD: Lipase: 35 U/L (ref 11–51)

## 2019-01-04 IMAGING — DX DG ABD PORTABLE 1V
2 series · 2 of 2 positions shown · non-contrast
Comparison: MRI [DATE]

CLINICAL DATA: Pancreatitis.

EXAM:
PORTABLE ABDOMEN - 1 VIEW

[abdomen supine (1 of 2)]
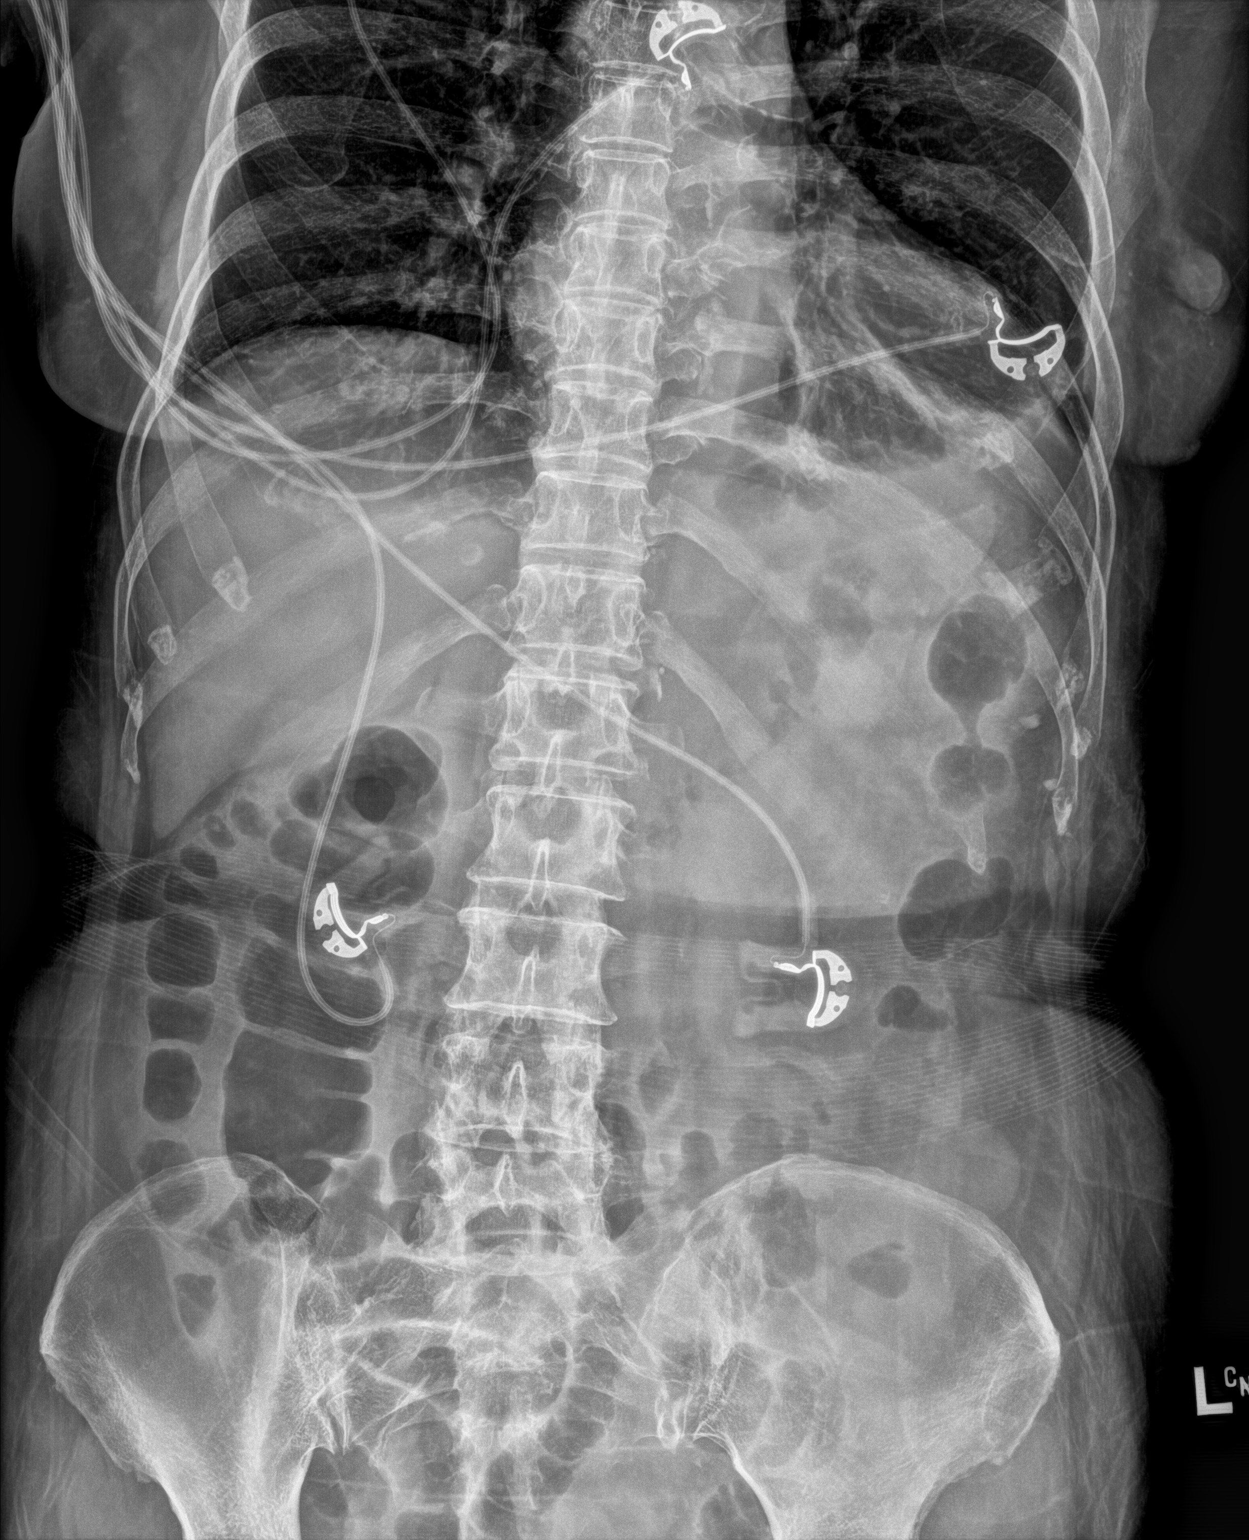

[abdomen supine (2 of 2)]
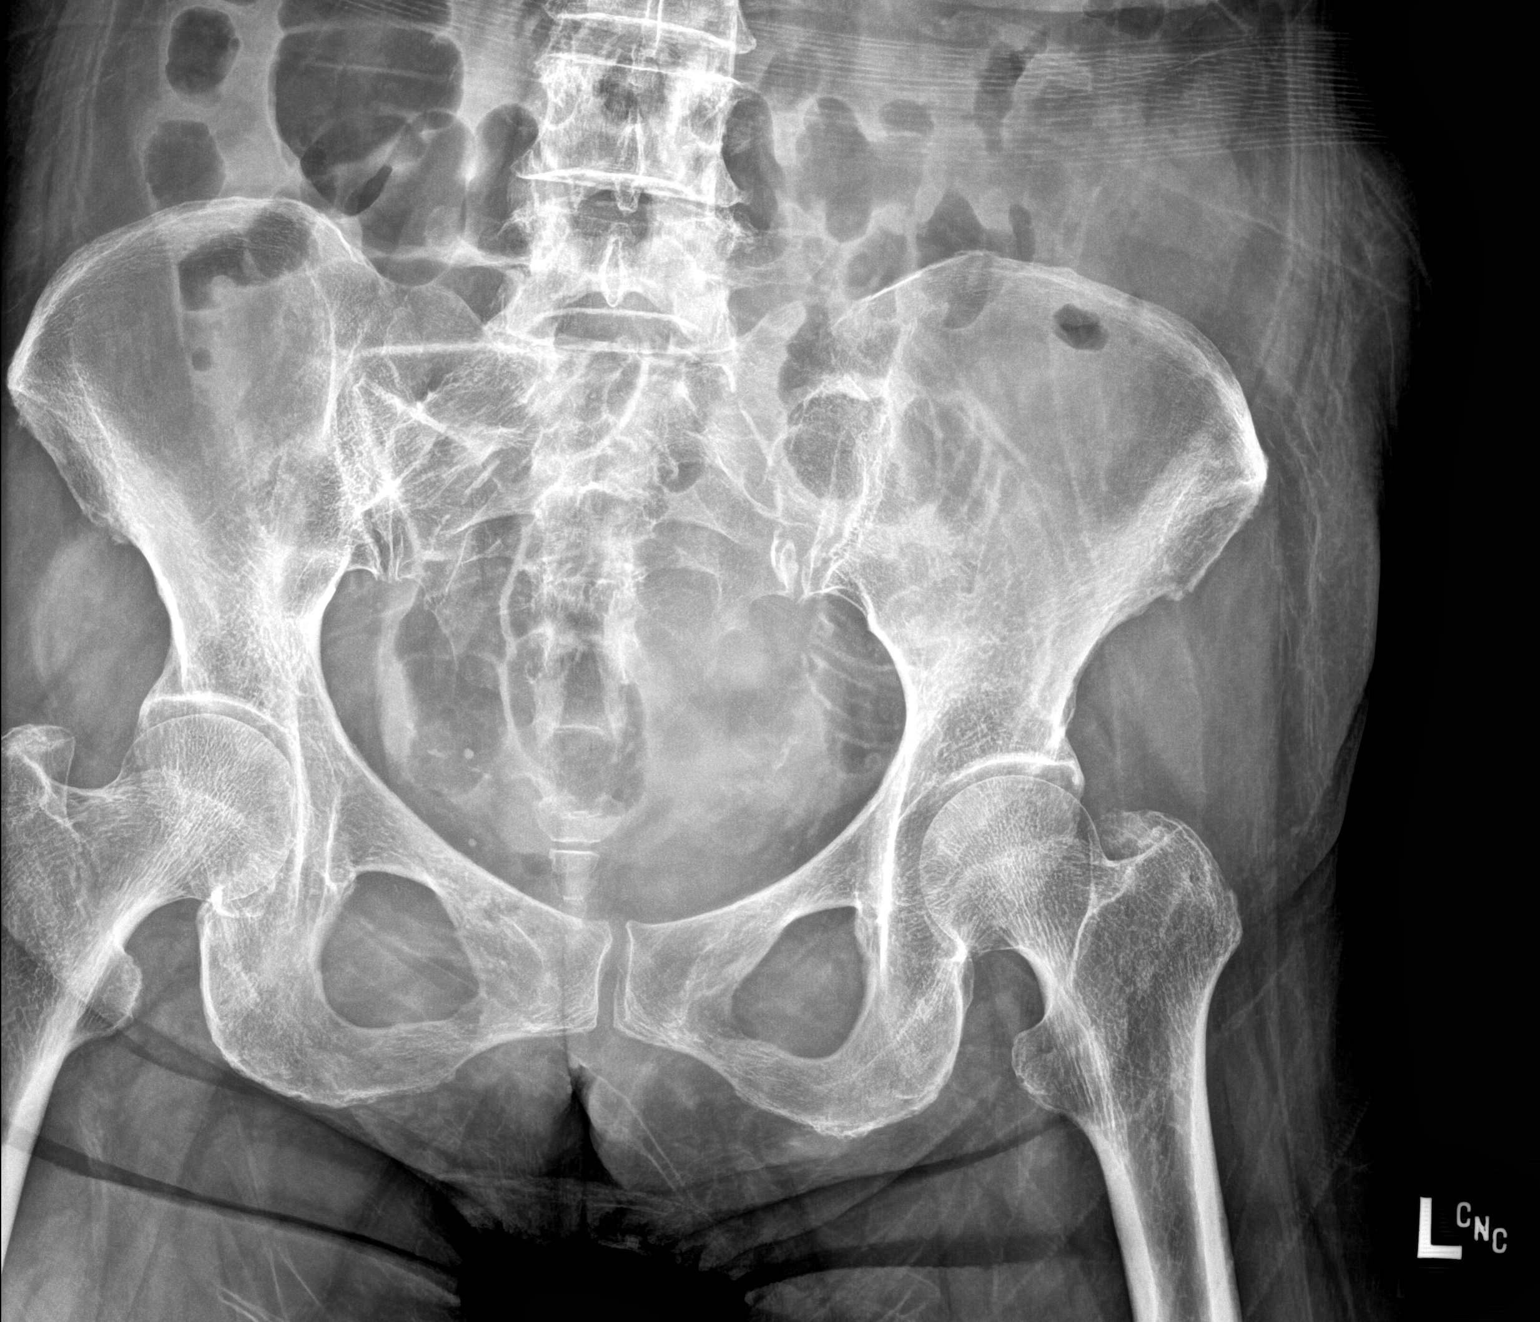

[2 of 2 positions shown; findings below may reference images not displayed]

FINDINGS: No dilated large or small bowel. There is gas in the rectum. No
intraperitoneal free air. No pathologic calcifications identified.
LEFT basilar atelectasis.
IMPRESSION: No evidence of bowel obstruction.  No free air.

LEFT basilar atelectasis.

## 2019-01-04 MED ORDER — MORPHINE SULFATE (PF) 2 MG/ML IV SOLN
2.0000 mg | INTRAVENOUS | Status: DC | PRN
  Administered 2019-01-05: 2 mg via INTRAVENOUS
  Filled 2019-01-04: qty 1

## 2019-01-04 MED ORDER — OXYCODONE HCL 5 MG PO TABS
5.0000 mg | ORAL_TABLET | ORAL | Status: DC | PRN
  Administered 2019-01-04 – 2019-01-05 (×5): 5 mg via ORAL
  Filled 2019-01-04 (×5): qty 1

## 2019-01-04 MED ORDER — METHOCARBAMOL 500 MG PO TABS
500.0000 mg | ORAL_TABLET | Freq: Three times a day (TID) | ORAL | Status: AC
  Administered 2019-01-04 (×3): 500 mg via ORAL
  Filled 2019-01-04 (×3): qty 1

## 2019-01-04 NOTE — Anesthesia Preprocedure Evaluation (Addendum)
Anesthesia Evaluation  Patient identified by MRN, date of birth, ID band Patient awake    Reviewed: Allergy & Precautions, NPO status , Patient's Chart, lab work & pertinent test results, reviewed documented beta blocker date and time   Airway Mallampati: II  TM Distance: >3 FB     Dental  (+) Chipped   Pulmonary           Cardiovascular hypertension,      Neuro/Psych    GI/Hepatic   Endo/Other    Renal/GU      Musculoskeletal  (+) Arthritis ,   Abdominal   Peds  Hematology   Anesthesia Other Findings Past Medical History: 07/29/2016: Essential hypertension 07/29/2016: History of vertigo 07/29/2016: Hyperlipidemia 07/29/2016: Osteoarthritis of back 07/29/2016: Osteopenia of multiple sites 07/29/2016: Seasonal allergies 07/29/2016: Urinary, incontinence, stress female   Reproductive/Obstetrics                            Anesthesia Physical Anesthesia Plan  ASA: II  Anesthesia Plan: General   Post-op Pain Management:    Induction: Intravenous  PONV Risk Score and Plan:   Airway Management Planned: Oral ETT  Additional Equipment:   Intra-op Plan:   Post-operative Plan:   Informed Consent: I have reviewed the patients History and Physical, chart, labs and discussed the procedure including the risks, benefits and alternatives for the proposed anesthesia with the patient or authorized representative who has indicated his/her understanding and acceptance.       Plan Discussed with: CRNA  Anesthesia Plan Comments:         Anesthesia Quick Evaluation

## 2019-01-04 NOTE — Care Management Important Message (Signed)
Important Message  Patient Details  Name: EMEREE MAHLER MRN: [id number] Date of Birth: September 11, 1935   Medicare Important Message Given:  Yes     Dannette Barbara 01/04/2019, 2:15 PM

## 2019-01-04 NOTE — Progress Notes (Signed)
[place] Hospital Day(s): 3.   Post op day(s):  Marland Kitchen   Interval History: Patient seen and examined, no acute events or new complaints overnight. Patient reports feeling better today.  Today the patient reported the pain is 0 out of 10 in the afternoon.  She is tolerating diet.  Denies any nausea or vomiting.  There is no pain radiation.  There is no alleviating or aggravating factor.  Vital signs in last 24 hours: [min-max] current  Temp:  [98 F (36.7 C)-99.9 F (37.7 C)] 98 F (36.7 C) (12/17 1528) Pulse Rate:  [69-91] 70 (12/17 1528) Resp:  [18] 18 (12/17 1528) BP: (168-182)/(73-90) 174/79 (12/17 1528) SpO2:  [95 %-98 %] 97 % (12/17 1528)     Height: 4\' 11"  (149.9 cm) Weight: 47.2 kg BMI (Calculated): 21.01   Physical Exam:  Constitutional: alert, cooperative and no distress  Respiratory: breathing non-labored at rest  Cardiovascular: regular rate and sinus rhythm  Gastrointestinal: soft, non-tender, and non-distended  Labs:  CBC Latest Ref Rng & Units 01/02/2019 12/31/2018 12/29/2018  WBC 4.0 - 10.5 K/uL 10.6(H) 10.9(H) 14.9(H)  Hemoglobin 12.0 - 15.0 g/dL 12.5 15.1(H) 15.3(H)  Hematocrit 36.0 - 46.0 % 37.6 47.0(H) 47.3(H)  Platelets 150 - 400 K/uL 164 199 254   CMP Latest Ref Rng & Units 01/04/2019 01/03/2019 01/02/2019  Glucose 70 - 99 mg/dL 155(H) 179(H) 90  BUN 8 - 23 mg/dL 9 12 21   Creatinine 0.44 - 1.00 mg/dL 0.48 0.51 0.53  Sodium 135 - 145 mmol/L 138 137 139  Potassium 3.5 - 5.1 mmol/L 3.8 3.4(L) 2.8(L)  Chloride 98 - 111 mmol/L 109 106 106  CO2 22 - 32 mmol/L 21(L) 20(L) 17(L)  Calcium 8.9 - 10.3 mg/dL 7.9(L) 7.6(L) 7.7(L)  Total Protein 6.5 - 8.1 g/dL 6.1(L) 6.3(L) 6.8  Total Bilirubin 0.3 - 1.2 mg/dL 1.0 1.7(H) 2.2(H)  Alkaline Phos 38 - 126 U/L 90 83 76  AST 15 - 41 U/L 30 40 43(H)  ALT 0 - 44 U/L 38 44 46(H)    Imaging studies: No new pertinent imaging studies   Assessment/Plan:  83 y.o.femalewith calcium pancreatitis, complicated  by pertinent comorbidities includingpneumonia by imaging but not clinically, hypertension, hyperlipidemia. Pancreatitis resolved clinically and with labs.  Today no abdominal pain.  Patient without chest pain or shortness of breath.  Patient oriented again about the need of surgical management for removal of the gallbladder due to gas and pancreatitis.  The risk of having another episode is very high.  Patient understood.  Patient notified about the risk of surgery that include common bile duct injury, bile leak, stone retention, need of second surgery, infection, bleeding, among others.  Today the patient with adequate potassium and electrolytes.  White blood cell count within normal limits.  Normal hemoglobin and platelet count.  We will proceed with surgery tomorrow.  Arnold Long, MD

## 2019-01-04 NOTE — H&P (View-Only) (Signed)
SURGICAL PROGRESS NOTE   Hospital Day(s): 3.   Post op day(s):  .   Interval History: Patient seen and examined, no acute events or new complaints overnight. Patient reports feeling better today.  Today the patient reported the pain is 0 out of 10 in the afternoon.  She is tolerating diet.  Denies any nausea or vomiting.  There is no pain radiation.  There is no alleviating or aggravating factor.  Vital signs in last 24 hours: [min-max] current  Temp:  [98 F (36.7 C)-99.9 F (37.7 C)] 98 F (36.7 C) (12/17 1528) Pulse Rate:  [69-91] 70 (12/17 1528) Resp:  [18] 18 (12/17 1528) BP: (168-182)/(73-90) 174/79 (12/17 1528) SpO2:  [95 %-98 %] 97 % (12/17 1528)     Height: 4' 11" (149.9 cm) Weight: 47.2 kg BMI (Calculated): 21.01   Physical Exam:  Constitutional: alert, cooperative and no distress  Respiratory: breathing non-labored at rest  Cardiovascular: regular rate and sinus rhythm  Gastrointestinal: soft, non-tender, and non-distended  Labs:  CBC Latest Ref Rng & Units 01/02/2019 12/31/2018 12/29/2018  WBC 4.0 - 10.5 K/uL 10.6(H) 10.9(H) 14.9(H)  Hemoglobin 12.0 - 15.0 g/dL 12.5 15.1(H) 15.3(H)  Hematocrit 36.0 - 46.0 % 37.6 47.0(H) 47.3(H)  Platelets 150 - 400 K/uL 164 199 254   CMP Latest Ref Rng & Units 01/04/2019 01/03/2019 01/02/2019  Glucose 70 - 99 mg/dL 155(H) 179(H) 90  BUN 8 - 23 mg/dL 9 12 21  Creatinine 0.44 - 1.00 mg/dL 0.48 0.51 0.53  Sodium 135 - 145 mmol/L 138 137 139  Potassium 3.5 - 5.1 mmol/L 3.8 3.4(L) 2.8(L)  Chloride 98 - 111 mmol/L 109 106 106  CO2 22 - 32 mmol/L 21(L) 20(L) 17(L)  Calcium 8.9 - 10.3 mg/dL 7.9(L) 7.6(L) 7.7(L)  Total Protein 6.5 - 8.1 g/dL 6.1(L) 6.3(L) 6.8  Total Bilirubin 0.3 - 1.2 mg/dL 1.0 1.7(H) 2.2(H)  Alkaline Phos 38 - 126 U/L 90 83 76  AST 15 - 41 U/L 30 40 43(H)  ALT 0 - 44 U/L 38 44 46(H)    Imaging studies: No new pertinent imaging studies   Assessment/Plan:  83 y.o.femalewith calcium pancreatitis, complicated  by pertinent comorbidities includingpneumonia by imaging but not clinically, hypertension, hyperlipidemia. Pancreatitis resolved clinically and with labs.  Today no abdominal pain.  Patient without chest pain or shortness of breath.  Patient oriented again about the need of surgical management for removal of the gallbladder due to gas and pancreatitis.  The risk of having another episode is very high.  Patient understood.  Patient notified about the risk of surgery that include common bile duct injury, bile leak, stone retention, need of second surgery, infection, bleeding, among others.  Today the patient with adequate potassium and electrolytes.  White blood cell count within normal limits.  Normal hemoglobin and platelet count.  We will proceed with surgery tomorrow.  Kimberlye Dilger Cintrn-Daz, MD    

## 2019-01-04 NOTE — Progress Notes (Addendum)
PROGRESS NOTE    Madeline Taylor  DXI:[id number] DOB: 03-Sep-1935 DOA: 12/31/2018 PCP: Mikey College, NP (Inactive)    Brief Narrative:  2 days ago, Due to persistent symptoms she presented to [facility] for evaluation. She left before lab results returned which did reveal an lipase of 3,922! She returns today for persistent pain with N/V and increasing weakness. She describes the pain as severe, piercing to the back. She has not been able to eat and has taken in very little fluid. No prior GI history and no knolwedge of having Gallstones. Lab revealed mild AKI with Cr rising from 0.89 to 101 and BUN rising from 23 to 49. CT abd/pelvis reveals cholelithiasis w/o cholecystitis, no CBD dilatation, inflammation of the pancreas with swelling of the pancreatic tail w/o cyst or mass. Scan did reveal multi-focal ground glass infiltrates suggestive of viral/covid infection.   Consultants:   General surgery  Procedures:  CT abdomen and pelvis IMPRESSION: 1. Acute pancreatitis. Area of heterogeneous enlargement in the tail of pancreas may reflect early pseudocyst formation. Small area of necrosis is felt less likely. No other evidence suggest necrosis. No formed pseudo cyst or evidence of an abscess. No venous thrombosis. 2. Lung base opacities consistent with multifocal infection. Recommend assessing for COVID-19 infection. 3. No other acute abnormalities. 4. Small gallstones. 5. Aortic atherosclerosis   CT angio chest IMPRESSION: 1. No evidence for pulmonary embolus. 2. Multifocal areas of consolidation as detailed above consistent with multifocal pneumonia. 3. Trace bilateral pleural effusions.  Aortic Atherosclerosis (ICD10-I70.0).  Antimicrobials:  Antibiotics Given (last 72 hours)    Date/Time Action Medication Dose Rate   01/02/19 0930 New Bag/Given   cefTRIAXone (ROCEPHIN) 2 g in sodium chloride 0.9 % 100 mL IVPB 2 g 200 mL/hr   01/02/19 1031 New Bag/Given   azithromycin  (ZITHROMAX) 500 mg in sodium chloride 0.9 % 250 mL IVPB 500 mg 250 mL/hr   01/03/19 0931 New Bag/Given   cefTRIAXone (ROCEPHIN) 2 g in sodium chloride 0.9 % 100 mL IVPB 2 g 200 mL/hr   01/03/19 1046 New Bag/Given   azithromycin (ZITHROMAX) 500 mg in sodium chloride 0.9 % 250 mL IVPB 500 mg 250 mL/hr   01/04/19 0920 New Bag/Given   cefTRIAXone (ROCEPHIN) 2 g in sodium chloride 0.9 % 100 mL IVPB 2 g 200 mL/hr   01/04/19 1055 New Bag/Given  [other antibiotic running]   azithromycin (ZITHROMAX) 500 mg in sodium chloride 0.9 % 250 mL IVPB 500 mg 250 mL/hr   01/05/19 0751 New Bag/Given   cefTRIAXone (ROCEPHIN) 2 g in sodium chloride 0.9 % 100 mL IVPB 2 g 200 mL/hr   01/05/19 0852 New Bag/Given   azithromycin (ZITHROMAX) 500 mg in sodium chloride 0.9 % 250 mL IVPB 500 mg 250 mL/hr         Subjective: Continues to have abdominal pain.  No nausea no vomiting. Passing gas. No bowel movement. No shortness of breath. No cough.   Objective: Vitals:   01/04/19 0336 01/04/19 0908 01/04/19 1412 01/04/19 1528  BP: (!) 180/74 (!) 174/83 (!) 168/73 (!) 174/79  Pulse: 77 88 69 70  Resp:  18  18  Temp: 98.8 F (37.1 C) 98 F (36.7 C)  98 F (36.7 C)  TempSrc: Oral Oral  Oral  SpO2: 98% 97%  97%  Weight:      Height:        Intake/Output Summary (Last 24 hours) at 01/04/2019 1911 Last data filed at 01/04/2019 0600 Gross per  24 hour  Intake 2376.75 ml  Output --  Net 2376.75 ml   Filed Weights   12/31/18 1116  Weight: 47.2 kg    Examination:  General exam: Appears calm and comfortable NAD Respiratory system: Clear to auscultation. Respiratory effort normal. Cardiovascular system: S1 & S2 heard, RRR. No JVD, murmurs, rubs, gallops or clicks. Gastrointestinal system: Abdomen is nondistended, soft and nontender. Normal bowel sounds heard. Central nervous system: Alert and oriented. No focal neurological deficits. Extremities: No edema Skin: Warm and dry Psychiatry: Judgement and  insight appear normal. Mood & affect appropriate.     Data Reviewed: I have personally reviewed following labs and imaging studies  CBC: Recent Labs  Lab 12/29/18 2053 12/31/18 1118 01/02/19 0805  WBC 14.9* 10.9* 10.6*  HGB 15.3* 15.1* 12.5  HCT 47.3* 47.0* 37.6  MCV 90.8 90.9 90.6  PLT 254 199 164   Basic Metabolic Panel: Recent Labs  Lab 12/31/18 1118 01/01/19 0413 01/02/19 0805 01/03/19 0230 01/04/19 0326  NA 138 139 139 137 138  K 3.3* 3.8 2.8* 3.4* 3.8  CL 99 107 106 106 109  CO2 25 22 17* 20* 21*  GLUCOSE 127* 95 90 179* 155*  BUN 42* 28* 21 12 9   CREATININE 1.01* 0.62 0.53 0.51 0.48  CALCIUM 8.4* 7.0* 7.7* 7.6* 7.9*   GFR: Estimated Creatinine Clearance: 37.6 mL/min (by C-G formula based on SCr of 0.48 mg/dL). Liver Function Tests: Recent Labs  Lab 12/31/18 1118 01/01/19 0413 01/02/19 0805 01/03/19 0230 01/04/19 0326  AST 59* 48* 43* 40 30  ALT 95* 57* 46* 44 38  ALKPHOS 86 62 76 83 90  BILITOT 1.7* 2.2* 2.2* 1.7* 1.0  PROT 8.1 6.2* 6.8 6.3* 6.1*  ALBUMIN 4.2 3.1* 3.0* 2.7* 2.6*   Recent Labs  Lab 12/31/18 1118 01/01/19 0413 01/02/19 0805 01/03/19 0230 01/04/19 0326  LIPASE 322* 92* 29 33 35   Sepsis Labs: Recent Labs  Lab 12/31/18 1118  PROCALCITON 25.26    Recent Results (from the past 240 hour(s))  SARS CORONAVIRUS 2 (TAT 6-24 HRS) Nasopharyngeal Nasopharyngeal Swab     Status: None   Collection Time: 12/31/18  7:42 PM   Specimen: Nasopharyngeal Swab  Result Value Ref Range Status   SARS Coronavirus 2 NEGATIVE NEGATIVE Final    Comment: (NOTE) SARS-CoV-2 target nucleic acids are NOT DETECTED. The SARS-CoV-2 RNA is generally detectable in upper and lower respiratory specimens during the acute phase of infection. Negative results do not preclude SARS-CoV-2 infection, do not rule out co-infections with other pathogens, and should not be used as the sole basis for treatment or other patient management decisions. Negative results  must be combined with clinical observations, patient history, and epidemiological information. The expected result is Negative. Fact Sheet for Patients: 01/02/19 Fact Sheet for Healthcare Providers: HairSlick.no This test is not yet approved or cleared by the [place] FDA and  has been authorized for detection and/or diagnosis of SARS-CoV-2 by FDA under an Emergency Use Authorization (EUA). This EUA will remain  in effect (meaning this test can be used) for the duration of the COVID-19 declaration under Section 56 4(b)(1) of the Act, 21 U.S.C. section 360bbb-3(b)(1), unless the authorization is terminated or revoked sooner. Performed at [facility], [address] [place], [place]   SARS CORONAVIRUS 2 (TAT 6-24 HRS) Nasopharyngeal Nasopharyngeal Swab     Status: None   Collection Time: 01/01/19  6:15 AM   Specimen: Nasopharyngeal Swab  Result Value Ref  Range Status   SARS Coronavirus 2 NEGATIVE NEGATIVE Final    Comment: (NOTE) SARS-CoV-2 target nucleic acids are NOT DETECTED. The SARS-CoV-2 RNA is generally detectable in upper and lower respiratory specimens during the acute phase of infection. Negative results do not preclude SARS-CoV-2 infection, do not rule out co-infections with other pathogens, and should not be used as the sole basis for treatment or other patient management decisions. Negative results must be combined with clinical observations, patient history, and epidemiological information. The expected result is Negative. Fact Sheet for Patients: HairSlick.no Fact Sheet for Healthcare Providers: quierodirigir.com This test is not yet approved or cleared by the [place] FDA and  has been authorized for detection and/or diagnosis of SARS-CoV-2 by FDA under an Emergency Use Authorization (EUA). This EUA will remain  in effect  (meaning this test can be used) for the duration of the COVID-19 declaration under Section 56 4(b)(1) of the Act, 21 U.S.C. section 360bbb-3(b)(1), unless the authorization is terminated or revoked sooner. Performed at [facility], [address], [place], [place] [address]          Radiology Studies: MR 3D Recon At Scanner  Result Date: 01/03/2019 CLINICAL DATA:  Abdominal pain for 2 days. Nausea. Cholelithiasis. Acute pancreatitis. EXAM: MRI ABDOMEN WITHOUT AND WITH CONTRAST (INCLUDING MRCP) TECHNIQUE: Multiplanar multisequence MR imaging of the abdomen was performed both before and after the administration of intravenous contrast. Heavily T2-weighted images of the biliary and pancreatic ducts were obtained, and three-dimensional MRCP images were rendered by post processing. CONTRAST:  4mL GADAVIST GADOBUTROL 1 MMOL/ML IV SOLN COMPARISON:  CT on 12/31/2018 FINDINGS: Lower chest: Mild bibasilar atelectasis. Hepatobiliary: Subcentimeter cyst is seen in the central left hepatic lobe. A 1 cm lesion is also seen in the dome of the right lobe which shows T1 hypointensity, T2 isointensity, and no significant contrast enhancement. This appears to show calcifications on recent CT, and is an indeterminate but probably benign lesion. No evidence of steatosis. Tiny gallstones are better visualized on recent CT. No evidence of cholecystitis. No evidence of biliary ductal dilatation with common bile duct measuring 5 mm in diameter. No evidence of choledocholithiasis or biliary stricture. Pancreas: Mild-to-moderate pancreatic edema is seen which is greatest in the pancreatic tail. Mild peripancreatic inflammatory changes seen with minimal amount of peripancreatic fluid. These findings are consistent with acute pancreatitis. No evidence of pancreatic ductal dilatation or pancreas divisum. No evidence of pancreatic mass or pancreatic necrosis. No pseudocysts identified. Spleen:  Within normal limits in size  and appearance. Adrenals/Urinary Tract: A 1.8 cm homogeneous left adrenal mass is seen, however no signal dropout is seen on chemical shift imaging. Small right renal cyst is noted. No evidence of renal mass or hydronephrosis. Stomach/Bowel: Visualized portion unremarkable. Vascular/Lymphatic: No pathologically enlarged lymph nodes identified. No abdominal aortic aneurysm. Other:  None. Musculoskeletal:  No suspicious bone lesions identified. IMPRESSION: 1. Mild to moderate acute pancreatitis. No evidence of pancreatic necrosis or pseudocyst. 2. Cholelithiasis. No radiographic evidence of cholecystitis. 3. No evidence of biliary ductal dilatation or choledocholithiasis. 4. 1.8 cm indeterminate left renal mass. This most likely represents a benign adenoma in patient without history of cancer. Recommend continued follow-up by MRI in 12 months to confirm stability. This recommendation follows ACR consensus guidelines: Management of Incidental Adrenal Masses: A White Paper of the ACR Incidental Findings Committee. J Am Coll Radiol 2017;14:1038-1044. 5. 1.8 cm indeterminate right lobe lesion, likely benign. Recommend continued attention on follow-up MRI in 12 months. Electronically  Signed   By: Danae Orleans M.D.   On: 01/03/2019 08:20   DG Abd Portable 1V  Result Date: 01/04/2019 CLINICAL DATA:  Pancreatitis. EXAM: PORTABLE ABDOMEN - 1 VIEW COMPARISON:  MRI 01/02/2019 FINDINGS: No dilated large or small bowel. There is gas in the rectum. No intraperitoneal free air. No pathologic calcifications identified. LEFT basilar atelectasis. IMPRESSION: No evidence of bowel obstruction.  No free air. LEFT basilar atelectasis. Electronically Signed   By: Genevive Bi M.D.   On: 01/04/2019 11:46   MR ABDOMEN MRCP W WO CONTAST  Result Date: 01/03/2019 CLINICAL DATA:  Abdominal pain for 2 days. Nausea. Cholelithiasis. Acute pancreatitis. EXAM: MRI ABDOMEN WITHOUT AND WITH CONTRAST (INCLUDING MRCP) TECHNIQUE: Multiplanar  multisequence MR imaging of the abdomen was performed both before and after the administration of intravenous contrast. Heavily T2-weighted images of the biliary and pancreatic ducts were obtained, and three-dimensional MRCP images were rendered by post processing. CONTRAST:  4mL GADAVIST GADOBUTROL 1 MMOL/ML IV SOLN COMPARISON:  CT on 12/31/2018 FINDINGS: Lower chest: Mild bibasilar atelectasis. Hepatobiliary: Subcentimeter cyst is seen in the central left hepatic lobe. A 1 cm lesion is also seen in the dome of the right lobe which shows T1 hypointensity, T2 isointensity, and no significant contrast enhancement. This appears to show calcifications on recent CT, and is an indeterminate but probably benign lesion. No evidence of steatosis. Tiny gallstones are better visualized on recent CT. No evidence of cholecystitis. No evidence of biliary ductal dilatation with common bile duct measuring 5 mm in diameter. No evidence of choledocholithiasis or biliary stricture. Pancreas: Mild-to-moderate pancreatic edema is seen which is greatest in the pancreatic tail. Mild peripancreatic inflammatory changes seen with minimal amount of peripancreatic fluid. These findings are consistent with acute pancreatitis. No evidence of pancreatic ductal dilatation or pancreas divisum. No evidence of pancreatic mass or pancreatic necrosis. No pseudocysts identified. Spleen:  Within normal limits in size and appearance. Adrenals/Urinary Tract: A 1.8 cm homogeneous left adrenal mass is seen, however no signal dropout is seen on chemical shift imaging. Small right renal cyst is noted. No evidence of renal mass or hydronephrosis. Stomach/Bowel: Visualized portion unremarkable. Vascular/Lymphatic: No pathologically enlarged lymph nodes identified. No abdominal aortic aneurysm. Other:  None. Musculoskeletal:  No suspicious bone lesions identified. IMPRESSION: 1. Mild to moderate acute pancreatitis. No evidence of pancreatic necrosis or  pseudocyst. 2. Cholelithiasis. No radiographic evidence of cholecystitis. 3. No evidence of biliary ductal dilatation or choledocholithiasis. 4. 1.8 cm indeterminate left renal mass. This most likely represents a benign adenoma in patient without history of cancer. Recommend continued follow-up by MRI in 12 months to confirm stability. This recommendation follows ACR consensus guidelines: Management of Incidental Adrenal Masses: A White Paper of the ACR Incidental Findings Committee. J Am Coll Radiol 2017;14:1038-1044. 5. 1.8 cm indeterminate right lobe lesion, likely benign. Recommend continued attention on follow-up MRI in 12 months. Electronically Signed   By: Danae Orleans M.D.   On: 01/03/2019 08:20        Scheduled Meds: . enoxaparin (LOVENOX) injection  40 mg Subcutaneous Q24H  . feeding supplement  1 Container Oral TID BM  . irbesartan  300 mg Oral Daily  . methocarbamol  500 mg Oral TID  . metoprolol tartrate  50 mg Oral BID  . nitroGLYCERIN  1 inch Topical Q8H  . polyethylene glycol  17 g Oral Daily  . potassium chloride  40 mEq Oral BID   Continuous Infusions: . azithromycin 500 mg (01/04/19 1055)  .  cefTRIAXone (ROCEPHIN)  IV 2 g (01/04/19 0920)  . dextrose 5 % and 0.45% NaCl 100 mL/hr at 01/04/19 [id number]    Assessment & Plan:   Active Problems:   Essential hypertension   Pancreatitis   Gallstone   Multifocal pneumonia   Malnutrition of moderate degree 83 y.o. female with hypertension, hyperlipidemia admitted for acute pancreatitis.  1. Acute gallstone pancreatitis - most likely passed a Gallstone with cholelithiasis on CT but no CBD dilatation. CT with small gallstones, LFTs improving.  Initial Lipase from 3,922  Trending down 33 Continue IVF D5 1/2NS until eating  Pain control Continue clear liquid diet for now -Fasting lipid panel shows triglyceride of 254 MRCP not showing any choledocholithiasis -Appreciate surgery input.  Likely conservative management -1.8 cm  left renal mass seen, likely benign adenoma per radiology   2. Multifocal PNA -on CT, stable.  Clinically doing fine, asx, and on RA. covid negative. Continue IV Rocephin and Zithromax for now Incidental finding, likely pneumonitis than pneumonia from vomiting. Pt does not have any respi findings.   3. HTN -uncontrolled - Continue Avapro - Increase metoprolol to 50 mg twice a day -Also as needed IVhydralazine and labetalol with parameters given  4.  Hypokalemia-replete and recheck, check magnesium  DVT prophylaxis: lovnenox Code Status:full Family Communication: None at bedside Disposition Plan: Likely be here 1-2 more days until medically stable       LOS: 3 days   Time spent: 35 minutes with more than 50% on COC    Lynden OxfordPranav Kyng Matlock, MD [facility]  If 7PM-7AM, please contact night-coverage [email or web address] Password [email or web address] 01/04/2019, 7:11 PM

## 2019-01-05 ENCOUNTER — Encounter
Admission: EM | Disposition: A | Discharge status: Home / Self Care | Payer: Self-pay | Source: Home / Self Care | Attending: Internal Medicine

## 2019-01-05 ENCOUNTER — Inpatient Hospital Stay: Payer: Medicare Other | Admitting: Anesthesiology

## 2019-01-05 ENCOUNTER — Encounter: Payer: Self-pay | Admitting: Family Medicine

## 2019-01-05 LAB — CBC WITH DIFFERENTIAL/PLATELET
Abs Immature Granulocytes: 0.27 10*3/uL — ABNORMAL HIGH (ref 0.00–0.07)
Basophils Absolute: 0.1 10*3/uL (ref 0.0–0.1)
Basophils Relative: 1 %
Eosinophils Absolute: 0 10*3/uL (ref 0.0–0.5)
Eosinophils Relative: 0 %
HCT: 33.1 % — ABNORMAL LOW (ref 36.0–46.0)
Hemoglobin: 11.6 g/dL — ABNORMAL LOW (ref 12.0–15.0)
Immature Granulocytes: 2 %
Lymphocytes Relative: 7 %
Lymphs Abs: 1 10*3/uL (ref 0.7–4.0)
MCH: 29.7 pg (ref 26.0–34.0)
MCHC: 35 g/dL (ref 30.0–36.0)
MCV: 84.9 fL (ref 80.0–100.0)
Monocytes Absolute: 0.5 10*3/uL (ref 0.1–1.0)
Monocytes Relative: 3 %
Neutro Abs: 12.2 10*3/uL — ABNORMAL HIGH (ref 1.7–7.7)
Neutrophils Relative %: 87 %
Platelets: 205 10*3/uL (ref 150–400)
RBC: 3.9 MIL/uL (ref 3.87–5.11)
RDW: 11.9 % (ref 11.5–15.5)
WBC: 14 10*3/uL — ABNORMAL HIGH (ref 4.0–10.5)
nRBC: 0 % (ref 0.0–0.2)

## 2019-01-05 LAB — COMPREHENSIVE METABOLIC PANEL
ALT: 30 U/L (ref 0–44)
AST: 23 U/L (ref 15–41)
Albumin: 2.4 g/dL — ABNORMAL LOW (ref 3.5–5.0)
Alkaline Phosphatase: 91 U/L (ref 38–126)
Anion gap: 9 (ref 5–15)
BUN: 9 mg/dL (ref 8–23)
CO2: 22 mmol/L (ref 22–32)
Calcium: 7.6 mg/dL — ABNORMAL LOW (ref 8.9–10.3)
Chloride: 107 mmol/L (ref 98–111)
Creatinine, Ser: 0.5 mg/dL (ref 0.44–1.00)
GFR calc Af Amer: >60 mL/min (ref >60)
GFR calc non Af Amer: >60 mL/min (ref >60)
Glucose, Bld: 131 mg/dL — ABNORMAL HIGH (ref 70–99)
Potassium: 3.9 mmol/L (ref 3.5–5.1)
Sodium: 138 mmol/L (ref 135–145)
Total Bilirubin: 0.8 mg/dL (ref 0.3–1.2)
Total Protein: 5.8 g/dL — ABNORMAL LOW (ref 6.5–8.1)

## 2019-01-05 LAB — MAGNESIUM: Magnesium: 2 mg/dL (ref 1.7–2.4)

## 2019-01-05 LAB — LACTIC ACID, PLASMA: Lactic Acid, Venous: 1.3 mmol/L (ref 0.5–1.9)

## 2019-01-05 SURGERY — CHOLECYSTECTOMY, ROBOT-ASSISTED, LAPAROSCOPIC
Anesthesia: General

## 2019-01-05 MED ORDER — FENTANYL CITRATE (PF) 100 MCG/2ML IJ SOLN
INTRAMUSCULAR | Status: DC | PRN
  Administered 2019-01-05 (×2): 50 ug via INTRAVENOUS

## 2019-01-05 MED ORDER — ESMOLOL HCL 100 MG/10ML IV SOLN
INTRAVENOUS | Status: AC
  Filled 2019-01-05: qty 10

## 2019-01-05 MED ORDER — ACETAMINOPHEN 10 MG/ML IV SOLN
INTRAVENOUS | Status: DC | PRN
  Administered 2019-01-05: 1000 mg via INTRAVENOUS

## 2019-01-05 MED ORDER — ONDANSETRON HCL 4 MG/2ML IJ SOLN
INTRAMUSCULAR | Status: DC | PRN
  Administered 2019-01-05: 4 mg via INTRAVENOUS

## 2019-01-05 MED ORDER — PROPOFOL 10 MG/ML IV BOLUS
INTRAVENOUS | Status: DC | PRN
  Administered 2019-01-05: 80 mg via INTRAVENOUS

## 2019-01-05 MED ORDER — SODIUM CHLORIDE 0.9 % IV SOLN
3.0000 g | Freq: Four times a day (QID) | INTRAVENOUS | Status: DC
  Administered 2019-01-05 – 2019-01-06 (×4): 3 g via INTRAVENOUS
  Filled 2019-01-05: qty 3
  Filled 2019-01-05 (×2): qty 8
  Filled 2019-01-05: qty 3
  Filled 2019-01-05: qty 8
  Filled 2019-01-05 (×2): qty 3
  Filled 2019-01-05: qty 8

## 2019-01-05 MED ORDER — SEVOFLURANE IN SOLN
RESPIRATORY_TRACT | Status: AC
  Filled 2019-01-05: qty 500

## 2019-01-05 MED ORDER — BUPIVACAINE HCL (PF) 0.5 % IJ SOLN
INTRAMUSCULAR | Status: AC
  Filled 2019-01-05: qty 30

## 2019-01-05 MED ORDER — LACTATED RINGERS IV BOLUS
300.0000 mL | Freq: Once | INTRAVENOUS | Status: AC
  Administered 2019-01-05: 300 mL via INTRAVENOUS

## 2019-01-05 MED ORDER — ROCURONIUM BROMIDE 50 MG/5ML IV SOLN
INTRAVENOUS | Status: AC
  Filled 2019-01-05: qty 1

## 2019-01-05 MED ORDER — DEXAMETHASONE SODIUM PHOSPHATE 10 MG/ML IJ SOLN
INTRAMUSCULAR | Status: AC
  Filled 2019-01-05: qty 1

## 2019-01-05 MED ORDER — MIDAZOLAM HCL 2 MG/2ML IJ SOLN
INTRAMUSCULAR | Status: AC
  Filled 2019-01-05: qty 2

## 2019-01-05 MED ORDER — FENTANYL CITRATE (PF) 100 MCG/2ML IJ SOLN
INTRAMUSCULAR | Status: AC
  Filled 2019-01-05: qty 2

## 2019-01-05 MED ORDER — PHENYLEPHRINE HCL (PRESSORS) 10 MG/ML IV SOLN
INTRAVENOUS | Status: DC | PRN
  Administered 2019-01-05: 100 ug via INTRAVENOUS
  Administered 2019-01-05: 150 ug via INTRAVENOUS
  Administered 2019-01-05: 100 ug via INTRAVENOUS
  Administered 2019-01-05: 150 ug via INTRAVENOUS
  Administered 2019-01-05: 100 ug via INTRAVENOUS

## 2019-01-05 MED ORDER — INDOCYANINE GREEN 25 MG IV SOLR
2.5000 mg | Freq: Once | INTRAVENOUS | Status: AC
  Administered 2019-01-05: 2.5 mg via INTRAVENOUS
  Filled 2019-01-05: qty 25

## 2019-01-05 MED ORDER — LIDOCAINE HCL (CARDIAC) PF 100 MG/5ML IV SOSY
PREFILLED_SYRINGE | INTRAVENOUS | Status: DC | PRN
  Administered 2019-01-05: 50 mg via INTRAVENOUS

## 2019-01-05 MED ORDER — BUPIVACAINE-EPINEPHRINE (PF) 0.5% -1:200000 IJ SOLN
INTRAMUSCULAR | Status: DC | PRN
  Administered 2019-01-05: 20 mL
  Administered 2019-01-05: 10 mL

## 2019-01-05 MED ORDER — ROCURONIUM BROMIDE 100 MG/10ML IV SOLN
INTRAVENOUS | Status: DC | PRN
  Administered 2019-01-05: 10 mg via INTRAVENOUS
  Administered 2019-01-05: 35 mg via INTRAVENOUS
  Administered 2019-01-05: 5 mg via INTRAVENOUS

## 2019-01-05 MED ORDER — SUCCINYLCHOLINE CHLORIDE 20 MG/ML IJ SOLN
INTRAMUSCULAR | Status: DC | PRN
  Administered 2019-01-05: 80 mg via INTRAVENOUS

## 2019-01-05 MED ORDER — DEXAMETHASONE SODIUM PHOSPHATE 10 MG/ML IJ SOLN
INTRAMUSCULAR | Status: DC | PRN
  Administered 2019-01-05: 5 mg via INTRAVENOUS

## 2019-01-05 MED ORDER — MIDAZOLAM HCL 2 MG/2ML IJ SOLN
INTRAMUSCULAR | Status: DC | PRN
  Administered 2019-01-05: 2 mg via INTRAVENOUS

## 2019-01-05 MED ORDER — DEXMEDETOMIDINE HCL 200 MCG/2ML IV SOLN
INTRAVENOUS | Status: DC | PRN
  Administered 2019-01-05 (×2): 8 ug via INTRAVENOUS

## 2019-01-05 MED ORDER — LACTATED RINGERS IV SOLN: INTRAVENOUS | Status: DC | PRN

## 2019-01-05 MED ORDER — SUGAMMADEX SODIUM 200 MG/2ML IV SOLN
INTRAVENOUS | Status: DC | PRN
  Administered 2019-01-05: 100 mg via INTRAVENOUS

## 2019-01-05 MED ORDER — EPINEPHRINE PF 1 MG/ML IJ SOLN
INTRAMUSCULAR | Status: AC
  Filled 2019-01-05: qty 1

## 2019-01-05 MED ORDER — SEVOFLURANE IN SOLN
RESPIRATORY_TRACT | Status: AC
  Filled 2019-01-05: qty 250

## 2019-01-05 SURGICAL SUPPLY — 77 items
"PENCIL ELECTRO HAND CTR " (MISCELLANEOUS) ×1 IMPLANT
APPLIER CLIP 5 13 M/L LIGAMAX5 (MISCELLANEOUS)
BLADE SURG SZ11 CARB STEEL (BLADE) ×3 IMPLANT
CANISTER SUCT 1200ML W/VALVE (MISCELLANEOUS) ×3 IMPLANT
CANNULA REDUC XI 12-8 STAPL (CANNULA) ×1
CANNULA REDUC XI 12-8MM STAPL (CANNULA) ×1
CANNULA REDUCER 12-8 DVNC XI (CANNULA) ×1 IMPLANT
CATH CHOLANG 76X19 KUMAR (CATHETERS) ×3 IMPLANT
CHLORAPREP W/TINT 26 (MISCELLANEOUS) ×3 IMPLANT
CLIP APPLIE 5 13 M/L LIGAMAX5 (MISCELLANEOUS) ×1 IMPLANT
CLIP VESOLOCK MED LG 6/CT (CLIP) ×3 IMPLANT
COVER TIP SHEARS 8 DVNC (MISCELLANEOUS) ×1 IMPLANT
COVER TIP SHEARS 8MM DA VINCI (MISCELLANEOUS) ×2
COVER WAND RF STERILE (DRAPES) ×3 IMPLANT
DECANTER SPIKE VIAL GLASS SM (MISCELLANEOUS) ×6 IMPLANT
DEFOGGER SCOPE WARMER CLEARIFY (MISCELLANEOUS) ×3 IMPLANT
DERMABOND ADVANCED (GAUZE/BANDAGES/DRESSINGS) ×2
DERMABOND ADVANCED .7 DNX12 (GAUZE/BANDAGES/DRESSINGS) ×1 IMPLANT
DRAPE 3/4 80X56 (DRAPES) ×3 IMPLANT
DRAPE ARM DVNC X/XI (DISPOSABLE) ×4 IMPLANT
DRAPE COLUMN DVNC XI (DISPOSABLE) ×1 IMPLANT
DRAPE DA VINCI XI ARM (DISPOSABLE) ×8
DRAPE DA VINCI XI COLUMN (DISPOSABLE) ×2
ELECT CAUTERY BLADE 6.4 (BLADE) ×3 IMPLANT
ELECT REM PT RETURN 9FT ADLT (ELECTROSURGICAL) ×3
ELECTRODE REM PT RTRN 9FT ADLT (ELECTROSURGICAL) ×1 IMPLANT
GLOVE BIO SURGEON STRL SZ 6.5 (GLOVE) ×4 IMPLANT
GLOVE BIO SURGEONS STRL SZ 6.5 (GLOVE) ×2
GLOVE BIOGEL PI IND STRL 6.5 (GLOVE) ×2 IMPLANT
GLOVE BIOGEL PI INDICATOR 6.5 (GLOVE) ×4
GOWN STRL REUS W/ TWL LRG LVL3 (GOWN DISPOSABLE) ×4 IMPLANT
GOWN STRL REUS W/TWL LRG LVL3 (GOWN DISPOSABLE) ×8
GRASPER SUT TROCAR 14GX15 (MISCELLANEOUS) ×3 IMPLANT
HEMOSTAT SURGICEL 2X3 (HEMOSTASIS) IMPLANT
IRRIGATION STRYKERFLOW (MISCELLANEOUS) ×1 IMPLANT
IRRIGATOR STRYKERFLOW (MISCELLANEOUS)
IRRIGATOR SUCT 8 DISP DVNC XI (IRRIGATION / IRRIGATOR) IMPLANT
IRRIGATOR SUCTION 8MM XI DISP (IRRIGATION / IRRIGATOR)
IV NS 1000ML (IV SOLUTION) ×2
IV NS 1000ML BAXH (IV SOLUTION) ×1 IMPLANT
KIT PINK PAD W/HEAD ARE REST (MISCELLANEOUS) ×3
KIT PINK PAD W/HEAD ARM REST (MISCELLANEOUS) ×1 IMPLANT
KIT TURNOVER KIT A (KITS) ×3 IMPLANT
LABEL OR SOLS (LABEL) ×3 IMPLANT
NDL HYPO 25X1 1.5 SAFETY (NEEDLE) ×1 IMPLANT
NDL INSUFFLATION 14GA 120MM (NEEDLE) ×1 IMPLANT
NEEDLE HYPO 22GX1.5 SAFETY (NEEDLE) ×3 IMPLANT
NEEDLE HYPO 25X1 1.5 SAFETY (NEEDLE) ×3 IMPLANT
NEEDLE INSUFFLATION 14GA 120MM (NEEDLE) ×3 IMPLANT
NEEDLE VERESS 14GA 120MM (NEEDLE) ×3 IMPLANT
NS IRRIG 500ML POUR BTL (IV SOLUTION) ×3 IMPLANT
OBTURATOR OPTICAL STANDARD 8MM (TROCAR) ×2
OBTURATOR OPTICAL STND 8 DVNC (TROCAR) ×1
OBTURATOR OPTICALSTD 8 DVNC (TROCAR) ×1 IMPLANT
PACK LAP CHOLECYSTECTOMY (MISCELLANEOUS) ×3 IMPLANT
PENCIL ELECTRO HAND CTR (MISCELLANEOUS) ×3 IMPLANT
POUCH SPECIMEN RETRIEVAL 10MM (ENDOMECHANICALS) ×3 IMPLANT
SCISSORS METZENBAUM CVD 33 (INSTRUMENTS) ×1 IMPLANT
SEAL CANN UNIV 5-8 DVNC XI (MISCELLANEOUS) ×3 IMPLANT
SEAL XI 5MM-8MM UNIVERSAL (MISCELLANEOUS) ×6
SET TUBE SMOKE EVAC HIGH FLOW (TUBING) ×3 IMPLANT
SLEEVE ENDOPATH XCEL 5M (ENDOMECHANICALS) ×6 IMPLANT
SOLUTION ELECTROLUBE (MISCELLANEOUS) ×3 IMPLANT
SPONGE LAP 4X18 RFD (DISPOSABLE) ×2 IMPLANT
STAPLER CANNULA SEAL DVNC XI (STAPLE) ×1 IMPLANT
STAPLER CANNULA SEAL XI (STAPLE)
SUT MNCRL 4-0 (SUTURE) ×2
SUT MNCRL 4-0 27XMFL (SUTURE) ×1
SUT MNCRL AB 4-0 PS2 18 (SUTURE) ×3 IMPLANT
SUT VIC AB 0 CT1 36 (SUTURE) IMPLANT
SUT VIC AB 3-0 SH 27 (SUTURE) ×2
SUT VIC AB 3-0 SH 27X BRD (SUTURE) ×1 IMPLANT
SUT VICRYL 0 AB UR-6 (SUTURE) ×3 IMPLANT
SUTURE MNCRL 4-0 27XMF (SUTURE) ×1 IMPLANT
TROCAR XCEL NON-BLD 11X100MML (ENDOMECHANICALS) ×3 IMPLANT
TROCAR XCEL NON-BLD 5MMX100MML (ENDOMECHANICALS) ×3 IMPLANT
TUBING EVAC SMOKE HEATED PNEUM (TUBING) ×3 IMPLANT

## 2019-01-05 NOTE — Progress Notes (Signed)
Pharmacy Antibiotic Note  Madeline Taylor is a 83 y.o. female admitted on 12/31/2018 with pneumonia.  Pharmacy has been consulted for Unasyn dosing. Changing from ceftriaxone/azithromycin to Unasyn.   Plan: Unasyn 3 g IV q6h   Height: 4\' 11"  (149.9 cm) Weight: 104 lb 0.9 oz (47.2 kg) IBW/kg (Calculated) : 43.2  Temp (24hrs), Avg:98.3 F (36.8 C), Min:98 F (36.7 C), Max:98.6 F (37 C)  Recent Labs  Lab 12/29/18 2053 12/31/18 1118 01/01/19 0413 01/02/19 0805 01/03/19 0230 01/04/19 0326 01/05/19 0258  WBC 14.9* 10.9*  --  10.6*  --   --  14.0*  CREATININE 0.82 1.01* 0.62 0.53 0.51 0.48 0.50  LATICACIDVEN  --   --   --   --   --   --  1.3    Estimated Creatinine Clearance: 37.6 mL/min (by C-G formula based on SCr of 0.5 mg/dL).    Allergies  Allergen Reactions  . Lisinopril Cough  . Statins     Antimicrobials this admission: Azithromycin 12/15 >> 12/18 CTX 12/15 >>12/18 Unasyn 12/18>>  Dose adjustments this admission:   Microbiology results:   Thank you for allowing pharmacy to be a part of this patient's care.  Rocky Morel 01/05/2019 9:36 AM

## 2019-01-05 NOTE — Transfer of Care (Signed)
Immediate Anesthesia Transfer of Care Note  Patient: Madeline Taylor  Procedure(s) Performed: XI ROBOTIC ASSISTED LAPAROSCOPIC CHOLECYSTECTOMY (N/A )  Patient Location: PACU  Anesthesia Type:General  Level of Consciousness: sedated  Airway & Oxygen Therapy: Patient Spontanous Breathing and Patient connected to face mask oxygen  Post-op Assessment: Report given to RN and Post -op Vital signs reviewed and stable  Post vital signs: Reviewed and stable  Last Vitals:  Vitals Value Taken Time  BP 98/49 01/05/19 1555  Temp 36.4 C 01/05/19 1553  Pulse 68 01/05/19 1558  Resp 16 01/05/19 1558  SpO2 100 % 01/05/19 1558  Vitals shown include unvalidated device data.  Last Pain:  Vitals:   01/05/19 1553  TempSrc:   PainSc: Asleep      Patients Stated Pain Goal: 5 (35/59/74 1638)  Complications: No apparent anesthesia complications

## 2019-01-05 NOTE — Progress Notes (Signed)
PROGRESS NOTE    Madeline Taylor  TIR:[id number] DOB: Apr 08, 1935 DOA: 12/31/2018 PCP: Mikey College, NP (Inactive)    Brief Narrative:  2 days ago, Due to persistent symptoms she presented to [facility] for evaluation. She left before lab results returned which did reveal an lipase of 3,922! She returns today for persistent pain with N/V and increasing weakness. She describes the pain as severe, piercing to the back. She has not been able to eat and has taken in very little fluid. No prior GI history and no knolwedge of having Gallstones. Lab revealed mild AKI with Cr rising from 0.89 to 101 and BUN rising from 23 to 49. CT abd/pelvis reveals cholelithiasis w/o cholecystitis, no CBD dilatation, inflammation of the pancreas with swelling of the pancreatic tail w/o cyst or mass. Scan did reveal multi-focal ground glass infiltrates suggestive of viral/covid infection.   Consultants:   General surgery  Procedures:  CT abdomen and pelvis IMPRESSION: 1. Acute pancreatitis. Area of heterogeneous enlargement in the tail of pancreas may reflect early pseudocyst formation. Small area of necrosis is felt less likely. No other evidence suggest necrosis. No formed pseudo cyst or evidence of an abscess. No venous thrombosis. 2. Lung base opacities consistent with multifocal infection. Recommend assessing for COVID-19 infection. 3. No other acute abnormalities. 4. Small gallstones. 5. Aortic atherosclerosis   CT angio chest IMPRESSION: 1. No evidence for pulmonary embolus. 2. Multifocal areas of consolidation as detailed above consistent with multifocal pneumonia. 3. Trace bilateral pleural effusions.  Aortic Atherosclerosis (ICD10-I70.0).  Antimicrobials:  Antibiotics Given (last 72 hours)    Date/Time Action Medication Dose Rate   01/03/19 0931 New Bag/Given   cefTRIAXone (ROCEPHIN) 2 g in sodium chloride 0.9 % 100 mL IVPB 2 g 200 mL/hr   01/03/19 1046 New Bag/Given   azithromycin  (ZITHROMAX) 500 mg in sodium chloride 0.9 % 250 mL IVPB 500 mg 250 mL/hr   01/04/19 0920 New Bag/Given   cefTRIAXone (ROCEPHIN) 2 g in sodium chloride 0.9 % 100 mL IVPB 2 g 200 mL/hr   01/04/19 1055 New Bag/Given  [other antibiotic running]   azithromycin (ZITHROMAX) 500 mg in sodium chloride 0.9 % 250 mL IVPB 500 mg 250 mL/hr   01/05/19 0751 New Bag/Given   cefTRIAXone (ROCEPHIN) 2 g in sodium chloride 0.9 % 100 mL IVPB 2 g 200 mL/hr   01/05/19 6761 New Bag/Given   azithromycin (ZITHROMAX) 500 mg in sodium chloride 0.9 % 250 mL IVPB 500 mg 250 mL/hr   01/05/19 1149 New Bag/Given   Ampicillin-Sulbactam (UNASYN) 3 g in sodium chloride 0.9 % 100 mL IVPB 3 g 200 mL/hr         Subjective: Still has significant abdominal pain.  No nausea or vomiting.  Passing gas.  Had a bowel movement as well.  No cough no shortness of breath.  Objective: Vitals:   01/05/19 1653 01/05/19 1703 01/05/19 1713 01/05/19 1742  BP: (!) 106/55 (!) 109/59 131/64 (!) 148/70  Pulse: 69 69 71 78  Resp: 13 12 14 17   Temp:   98.4 F (36.9 C) 97.7 F (36.5 C)  TempSrc:    Oral  SpO2: 95% 94% 99% 97%  Weight:      Height:        Intake/Output Summary (Last 24 hours) at 01/05/2019 1824 Last data filed at 01/05/2019 1749 Gross per 24 hour  Intake 2772.28 ml  Output 50 ml  Net 2722.28 ml   Filed Weights   12/31/18 1116  Weight: 47.2 kg    Examination:  General exam: Appears calm and comfortable NAD Respiratory system: Clear to auscultation. Respiratory effort normal. Cardiovascular system: S1 & S2 heard, RRR. No JVD, murmurs, rubs, gallops or clicks. Gastrointestinal system: Abdomen is nondistended, soft and nontender. Normal bowel sounds heard. Central nervous system: Alert and oriented. No focal neurological deficits. Extremities: No edema Skin: Warm and dry Psychiatry: Judgement and insight appear normal. Mood & affect appropriate.     Data Reviewed: I have personally reviewed following labs  and imaging studies  CBC: Recent Labs  Lab 12/29/18 2053 12/31/18 1118 01/02/19 0805 01/05/19 0258  WBC 14.9* 10.9* 10.6* 14.0*  NEUTROABS  --   --   --  12.2*  HGB 15.3* 15.1* 12.5 11.6*  HCT 47.3* 47.0* 37.6 33.1*  MCV 90.8 90.9 90.6 84.9  PLT 254 199 164 205   Basic Metabolic Panel: Recent Labs  Lab 01/01/19 0413 01/02/19 0805 01/03/19 0230 01/04/19 0326 01/05/19 0258  NA 139 139 137 138 138  K 3.8 2.8* 3.4* 3.8 3.9  CL 107 106 106 109 107  CO2 22 17* 20* 21* 22  GLUCOSE 95 90 179* 155* 131*  BUN 28* 21 12 9 9   CREATININE 0.62 0.53 0.51 0.48 0.50  CALCIUM 7.0* 7.7* 7.6* 7.9* 7.6*  MG  --   --   --   --  2.0   GFR: Estimated Creatinine Clearance: 37.6 mL/min (by C-G formula based on SCr of 0.5 mg/dL). Liver Function Tests: Recent Labs  Lab 01/01/19 0413 01/02/19 0805 01/03/19 0230 01/04/19 0326 01/05/19 0258  AST 48* 43* 40 30 23  ALT 57* 46* 44 38 30  ALKPHOS 62 76 83 90 91  BILITOT 2.2* 2.2* 1.7* 1.0 0.8  PROT 6.2* 6.8 6.3* 6.1* 5.8*  ALBUMIN 3.1* 3.0* 2.7* 2.6* 2.4*   Recent Labs  Lab 12/31/18 1118 01/01/19 0413 01/02/19 0805 01/03/19 0230 01/04/19 0326  LIPASE 322* 92* 29 33 35   Sepsis Labs: Recent Labs  Lab 12/31/18 1118 01/05/19 0258  PROCALCITON 25.26  --   LATICACIDVEN  --  1.3    Recent Results (from the past 240 hour(s))  SARS CORONAVIRUS 2 (TAT 6-24 HRS) Nasopharyngeal Nasopharyngeal Swab     Status: None   Collection Time: 12/31/18  7:42 PM   Specimen: Nasopharyngeal Swab  Result Value Ref Range Status   SARS Coronavirus 2 NEGATIVE NEGATIVE Final    Comment: (NOTE) SARS-CoV-2 target nucleic acids are NOT DETECTED. The SARS-CoV-2 RNA is generally detectable in upper and lower respiratory specimens during the acute phase of infection. Negative results do not preclude SARS-CoV-2 infection, do not rule out co-infections with other pathogens, and should not be used as the sole basis for treatment or other patient management  decisions. Negative results must be combined with clinical observations, patient history, and epidemiological information. The expected result is Negative. Fact Sheet for Patients: 01/02/19 Fact Sheet for Healthcare Providers: HairSlick.no This test is not yet approved or cleared by the [place] FDA and  has been authorized for detection and/or diagnosis of SARS-CoV-2 by FDA under an Emergency Use Authorization (EUA). This EUA will remain  in effect (meaning this test can be used) for the duration of the COVID-19 declaration under Section 56 4(b)(1) of the Act, 21 U.S.C. section 360bbb-3(b)(1), unless the authorization is terminated or revoked sooner. Performed at [facility], [address], [place], [place]   SARS CORONAVIRUS 2 (TAT 6-24 HRS) Nasopharyngeal Nasopharyngeal Swab  Status: None   Collection Time: 01/01/19  6:15 AM   Specimen: Nasopharyngeal Swab  Result Value Ref Range Status   SARS Coronavirus 2 NEGATIVE NEGATIVE Final    Comment: (NOTE) SARS-CoV-2 target nucleic acids are NOT DETECTED. The SARS-CoV-2 RNA is generally detectable in upper and lower respiratory specimens during the acute phase of infection. Negative results do not preclude SARS-CoV-2 infection, do not rule out co-infections with other pathogens, and should not be used as the sole basis for treatment or other patient management decisions. Negative results must be combined with clinical observations, patient history, and epidemiological information. The expected result is Negative. Fact Sheet for Patients: HairSlick.no Fact Sheet for Healthcare Providers: quierodirigir.com This test is not yet approved or cleared by the [place] FDA and  has been authorized for detection and/or diagnosis of SARS-CoV-2 by FDA under an Emergency Use Authorization (EUA). This  EUA will remain  in effect (meaning this test can be used) for the duration of the COVID-19 declaration under Section 56 4(b)(1) of the Act, 21 U.S.C. section 360bbb-3(b)(1), unless the authorization is terminated or revoked sooner. Performed at [facility], [address], [place], [place] [address]          Radiology Studies: DG Abd Portable 1V  Result Date: 01/04/2019 CLINICAL DATA:  Pancreatitis. EXAM: PORTABLE ABDOMEN - 1 VIEW COMPARISON:  MRI 01/02/2019 FINDINGS: No dilated large or small bowel. There is gas in the rectum. No intraperitoneal free air. No pathologic calcifications identified. LEFT basilar atelectasis. IMPRESSION: No evidence of bowel obstruction.  No free air. LEFT basilar atelectasis. Electronically Signed   By: Genevive Bi M.D.   On: 01/04/2019 11:46        Scheduled Meds: . enoxaparin (LOVENOX) injection  40 mg Subcutaneous Q24H  . feeding supplement  1 Container Oral TID BM  . irbesartan  300 mg Oral Daily  . methocarbamol  500 mg Oral TID  . metoprolol tartrate  50 mg Oral BID  . nitroGLYCERIN  1 inch Topical Q8H  . polyethylene glycol  17 g Oral Daily   Continuous Infusions: . ampicillin-sulbactam (UNASYN) IV 3 g (01/05/19 1149)  . dextrose 5 % and 0.45% NaCl 100 mL/hr at 01/05/19 1749    Assessment & Plan:   Active Problems:   Essential hypertension   Pancreatitis   Gallstone   Multifocal pneumonia   Malnutrition of moderate degree 83 y.o. female with hypertension, hyperlipidemia admitted for acute pancreatitis.  1. Acute gallstone pancreatitis - most likely passed a Gallstone with cholelithiasis on CT but no CBD dilatation. CT with small gallstones, LFTs improving.  Initial Lipase from 3,922  Trending down 33 Continue IVF D5 1/2NS until eating  Pain control Fasting lipid panel shows triglyceride of 254 MRCP not showing any choledocholithiasis -Appreciate surgery input.  Underwent cholecystectomy. We will monitor  postoperatively.  2. Multifocal PNA -on CT, stable.  Clinically doing fine, asx, and on RA. covid negative. Continue IV Rocephin and Zithromax for now Incidental finding, likely pneumonitis than pneumonia from vomiting. Pt does not have any respi findings.   3. HTN -uncontrolled - Continue Avapro - Increase metoprolol to 50 mg twice a day -Also as needed IVhydralazine and labetalol with parameters given  4.  Hypokalemia-repleted  -1.8 cm left renal mass seen, likely benign adenoma per radiology   DVT prophylaxis: lovnenox Code Status:full Family Communication: None at bedside Disposition Plan: Likely be here 1-2 more days until medically stable       LOS:  4 days   Time spent: 35 minutes with more than 50% on COC    Lynden OxfordPranav Joyanne Eddinger, MD [facility]  If 7PM-7AM, please contact night-coverage [email or web address] Password [email or web address] 01/05/2019, 6:24 PM

## 2019-01-05 NOTE — Anesthesia Procedure Notes (Signed)
Procedure Name: Intubation Date/Time: 01/05/2019 1:48 PM Performed by: Justus Memory, CRNA Pre-anesthesia Checklist: Patient identified, Patient being monitored, Timeout performed, Emergency Drugs available and Suction available Patient Re-evaluated:Patient Re-evaluated prior to induction Oxygen Delivery Method: Circle system utilized Preoxygenation: Pre-oxygenation with 100% oxygen Induction Type: IV induction, Rapid sequence and Cricoid Pressure applied Laryngoscope Size: Mac and 3 Grade View: Grade I Tube type: Oral Tube size: 7.0 mm Number of attempts: 1 Airway Equipment and Method: Stylet and Video-laryngoscopy Placement Confirmation: ETT inserted through vocal cords under direct vision,  positive ETCO2 and breath sounds checked- equal and bilateral Secured at: 19 cm Tube secured with: Tape Dental Injury: Teeth and Oropharynx as per pre-operative assessment

## 2019-01-05 NOTE — Anesthesia Post-op Follow-up Note (Signed)
Anesthesia QCDR form completed.        

## 2019-01-05 NOTE — Interval H&P Note (Signed)
History and Physical Interval Note:  01/05/2019 1:14 PM  Madeline Taylor  has presented today for surgery, with the diagnosis of Gallstone Pancreatitis.  The various methods of treatment have been discussed with the patient and family. After consideration of risks, benefits and other options for treatment, the patient has consented to  Procedure(s): XI ROBOTIC ASSISTED LAPAROSCOPIC CHOLECYSTECTOMY (N/A) LAPAROSCOPIC CHOLECYSTECTOMY (N/A) as a surgical intervention.  The patient's history has been reviewed, patient examined, no change in status, stable for surgery.  I have reviewed the patient's chart and labs.  Questions were answered to the patient's satisfaction.     Herbert Pun

## 2019-01-05 NOTE — Op Note (Signed)
Preoperative diagnosis: Gallstone pancreatitis  Postoperative diagnosis: Gallstone pancreatitis  Procedure: Robotic Assisted Laparoscopic Cholecystectomy.   Anesthesia: GETA   Surgeon: Dr. Windell Moment  Wound Classification: Clean Contaminated  Indications: Patient is a 83 y.o. female developed right upper quadrant pain, leukocytosis and on workup was found to have cholelithiasis with a normal common duct and biliary pancreatitis. MRCP showed normal CBD without obstruction. Robotic assisted Laparoscopic cholecystectomy was elected.  Findings: Severely inflamed gallbladder.  Critical view of safety achieved Cystic duct and artery identified, ligated and divided Adequate hemostasis  Description of procedure: The patient was placed on the operating table in the supine position. General anesthesia was induced. A time-out was completed verifying correct patient, procedure, site, positioning, and implant(s) and/or special equipment prior to beginning this procedure. An orogastric tube was placed. The abdomen was prepped and draped in the usual sterile fashion.  An incision was made in a natural skin line below the umbilicus.  The fascia was elevated and the Veress needle inserted. Proper position was confirmed by aspiration and saline meniscus test.  The abdomen was insufflated with carbon dioxide to a pressure of 15 mmHg. The patient tolerated insufflation well. A 8-mm trocar was then inserted in optiview fashion.  The laparoscope was inserted and the abdomen inspected. No injuries from initial trocar placement were noted. Additional trocars were then inserted in the following locations: an 8-mm trocar in the left lateral abdomen, and another two 8-mm trocars to the right side of the abdomen 5 cm appart. The umbilical trocar was changed to a 12 mm trocar all under direct visualization. The abdomen was inspected and no abnormalities were found. The table was placed in the reverse Trendelenburg  position with the right side up. The robotic arms were docked and target anatomy identified. Instrument inserted under direct visualization.  Filmy adhesions between the gallbladder and omentum, duodenum and transverse colon were lysed with electrocautery. The dome of the gallbladder was grasped with a prograsp and retracted over the dome of the liver. The infundibulum was also grasped with an atraumatic grasper and retracted toward the right lower quadrant. This maneuver exposed Calot's triangle. The peritoneum overlying the gallbladder infundibulum was then incised and the cystic duct and cystic artery identified and circumferentially dissected. Critical view of safety and firefly reviewed before ligating any structure. The cystic duct and cystic artery were then doubly clipped and divided close to the gallbladder.  The gallbladder was then dissected from its peritoneal attachments by electrocautery. Hemostasis was checked and the gallbladder and contained stones were removed using an endoscopic retrieval bag. The gallbladder was passed off the table as a specimen. The gallbladder fossa was copiously irrigated with saline and hemostasis was obtained. There was no evidence of bleeding from the gallbladder fossa or cystic artery or leakage of the bile from the cystic duct stump. Secondary trocars were removed under direct vision. No bleeding was noted. The robotic arms were undoked. The scope was withdrawn and the umbilical trocar removed. The abdomen was allowed to collapse. The fascia of the 77mm trocar sites was closed with figure-of-eight 0 vicryl sutures. The skin was closed with subcuticular sutures of 4-0 monocryl and topical skin adhesive. The orogastric tube was removed.  The patient tolerated the procedure well and was taken to the postanesthesia care unit in stable condition.   Specimen: Gallbladder  Complications: None  EBL: 25 mL

## 2019-01-06 LAB — COMPREHENSIVE METABOLIC PANEL
ALT: 48 U/L — ABNORMAL HIGH (ref 0–44)
AST: 68 U/L — ABNORMAL HIGH (ref 15–41)
Albumin: 2.1 g/dL — ABNORMAL LOW (ref 3.5–5.0)
Alkaline Phosphatase: 90 U/L (ref 38–126)
Anion gap: 9 (ref 5–15)
BUN: 10 mg/dL (ref 8–23)
CO2: 22 mmol/L (ref 22–32)
Calcium: 7.4 mg/dL — ABNORMAL LOW (ref 8.9–10.3)
Chloride: 107 mmol/L (ref 98–111)
Creatinine, Ser: 0.48 mg/dL (ref 0.44–1.00)
GFR calc Af Amer: >60 mL/min (ref >60)
GFR calc non Af Amer: >60 mL/min (ref >60)
Glucose, Bld: 181 mg/dL — ABNORMAL HIGH (ref 70–99)
Potassium: 3.8 mmol/L (ref 3.5–5.1)
Sodium: 138 mmol/L (ref 135–145)
Total Bilirubin: 0.6 mg/dL (ref 0.3–1.2)
Total Protein: 5.3 g/dL — ABNORMAL LOW (ref 6.5–8.1)

## 2019-01-06 LAB — CBC
HCT: 31.1 % — ABNORMAL LOW (ref 36.0–46.0)
Hemoglobin: 10.3 g/dL — ABNORMAL LOW (ref 12.0–15.0)
MCH: 29.8 pg (ref 26.0–34.0)
MCHC: 33.1 g/dL (ref 30.0–36.0)
MCV: 89.9 fL (ref 80.0–100.0)
Platelets: 221 10*3/uL (ref 150–400)
RBC: 3.46 MIL/uL — ABNORMAL LOW (ref 3.87–5.11)
RDW: 12.2 % (ref 11.5–15.5)
WBC: 12.4 10*3/uL — ABNORMAL HIGH (ref 4.0–10.5)
nRBC: 0 % (ref 0.0–0.2)

## 2019-01-06 LAB — BILIRUBIN, DIRECT: Bilirubin, Direct: 0.1 mg/dL (ref 0.0–0.2)

## 2019-01-06 LAB — LIPASE, BLOOD: Lipase: 30 U/L (ref 11–51)

## 2019-01-06 MED ORDER — AMOXICILLIN-POT CLAVULANATE 875-125 MG PO TABS: 1.0000 | ORAL_TABLET | Freq: Two times a day (BID) | ORAL | 0 refills | Status: AC

## 2019-01-06 MED ORDER — POLYETHYLENE GLYCOL 3350 17 G PO PACK: 17.0000 g | PACK | Freq: Every day | ORAL | 0 refills | Status: DC

## 2019-01-06 MED ORDER — HYDROCODONE-ACETAMINOPHEN 5-325 MG PO TABS: 1.0000 | ORAL_TABLET | ORAL | 0 refills | Status: AC | PRN

## 2019-01-06 MED ORDER — METHOCARBAMOL 500 MG PO TABS: 500.0000 mg | ORAL_TABLET | Freq: Three times a day (TID) | ORAL | 0 refills | Status: DC | PRN

## 2019-01-06 NOTE — Discharge Instructions (Signed)
  Diet: Resume home heart healthy low fat diet.   Activity: Increase activity as tolerated. Light activity and walking are encouraged. Do not drive or drink alcohol if taking narcotic pain medications.  Wound care: May shower with soapy water and pat dry (do not rub incisions), but no baths or submerging incision underwater until follow-up. (no swimming)   Medications: Resume all home medications. For mild to moderate pain: acetaminophen (Tylenol) or ibuprofen (if no kidney disease). Combining Tylenol with alcohol can substantially increase your risk of causing liver disease. Narcotic pain medications, if prescribed, can be used for severe pain, though may cause nausea, constipation, and drowsiness. Do not combine Tylenol and Norco within a 6 hour period as Norco contains Tylenol. If you do not need the narcotic pain medication, you do not need to fill the prescription.  Call office [phone]) at any time if any questions, worsening pain, fevers/chills, bleeding, drainage from incision site, or other concerns.

## 2019-01-06 NOTE — Anesthesia Postprocedure Evaluation (Signed)
Anesthesia Post Note  Patient: Madeline Taylor  Procedure(s) Performed: XI ROBOTIC ASSISTED LAPAROSCOPIC CHOLECYSTECTOMY (N/A )  Patient location during evaluation: PACU Anesthesia Type: General Level of consciousness: awake and alert Pain management: pain level controlled Vital Signs Assessment: post-procedure vital signs reviewed and stable Respiratory status: spontaneous breathing, nonlabored ventilation, respiratory function stable and patient connected to nasal cannula oxygen Cardiovascular status: blood pressure returned to baseline and stable Postop Assessment: no apparent nausea or vomiting Anesthetic complications: no     Last Vitals:  Vitals:   01/06/19 0100 01/06/19 0615  BP: (!) 156/73 (!) 172/80  Pulse: 61 68  Resp: 16 16  Temp:    SpO2: 98% 97%    Last Pain:  Vitals:   01/05/19 2334  TempSrc:   PainSc: Asleep                 Lolitha Tortora S     

## 2019-01-06 NOTE — Anesthesia Postprocedure Evaluation (Signed)
Anesthesia Post Note  Patient: Madeline Taylor  Procedure(s) Performed: XI ROBOTIC ASSISTED LAPAROSCOPIC CHOLECYSTECTOMY (N/A )  Patient location during evaluation: PACU Anesthesia Type: General Level of consciousness: awake and alert Pain management: pain level controlled Vital Signs Assessment: post-procedure vital signs reviewed and stable Respiratory status: spontaneous breathing, nonlabored ventilation, respiratory function stable and patient connected to nasal cannula oxygen Cardiovascular status: blood pressure returned to baseline and stable Postop Assessment: no apparent nausea or vomiting Anesthetic complications: no     Last Vitals:  Vitals:   01/06/19 0100 01/06/19 0615  BP: (!) 156/73 (!) 172/80  Pulse: 61 68  Resp: 16 16  Temp:    SpO2: 98% 97%    Last Pain:  Vitals:   01/05/19 2334  TempSrc:   PainSc: [name]

## 2019-01-06 NOTE — Plan of Care (Signed)

## 2019-01-06 NOTE — Progress Notes (Signed)
Patient c/o 5/10 pain, administered prn 5mg  oxycodone, patient lethargic, slow to respond, VS WNL. Discussed with on call provider Sharion Settler NP, received verbal order to discontinue oxycodone. Patient up/alert eating ice at the moment, more alert/ answering questions appropriately. Will continue to monitor.

## 2019-01-06 NOTE — Progress Notes (Signed)
D: Pt alert and oriented. Pt denies experiencing any pain at this time.   A: Pt and spouse received discharge and medication education/information. Pt belongings were gathered and taken with pt upon discharge.   R: Pt and spouse verbalized understanding of discharge and medication education/information.  Pt escorted by staff via wheelchair to medical mall front lobby where spouse picked her up.

## 2019-01-06 NOTE — Progress Notes (Signed)
[facility] Day(s): 5.   Post op day(s): 1 Day Post-Op.   Interval History: Patient seen and examined, no acute events or new complaints overnight. Patient reports "feeling wonderful". Today no pain and tolerating diet.  Vital signs in last 24 hours: [min-max] current  Temp:  [97.6 F (36.4 C)-99 F (37.2 C)] 97.8 F (36.6 C) (12/19 0826) Pulse Rate:  [61-94] 72 (12/19 1012) Resp:  [12-18] 16 (12/19 0615) BP: (82-174)/(44-80) 162/63 (12/19 1012) SpO2:  [94 %-100 %] 98 % (12/19 1012)     Height: 4\' 11"  (149.9 cm) Weight: 47.2 kg BMI (Calculated): 21.01   Physical Exam:  Constitutional: alert, cooperative and no distress  Respiratory: breathing non-labored at rest  Cardiovascular: regular rate and sinus rhythm  Gastrointestinal: soft, non-tender, and non-distended. Wounds are dry and clean.   Labs:  CBC Latest Ref Rng & Units 01/06/2019 01/05/2019 01/02/2019  WBC 4.0 - 10.5 K/uL 12.4(H) 14.0(H) 10.6(H)  Hemoglobin 12.0 - 15.0 g/dL 10.3(L) 11.6(L) 12.5  Hematocrit 36.0 - 46.0 % 31.1(L) 33.1(L) 37.6  Platelets 150 - 400 K/uL 221 205 164   CMP Latest Ref Rng & Units 01/06/2019 01/05/2019 01/04/2019  Glucose 70 - 99 mg/dL 181(H) 131(H) 155(H)  BUN 8 - 23 mg/dL 10 9 9   Creatinine 0.44 - 1.00 mg/dL 0.48 0.50 0.48  Sodium 135 - 145 mmol/L 138 138 138  Potassium 3.5 - 5.1 mmol/L 3.8 3.9 3.8  Chloride 98 - 111 mmol/L 107 107 109  CO2 22 - 32 mmol/L 22 22 21(L)  Calcium 8.9 - 10.3 mg/dL 7.4(L) 7.6(L) 7.9(L)  Total Protein 6.5 - 8.1 g/dL 5.3(L) 5.8(L) 6.1(L)  Total Bilirubin 0.3 - 1.2 mg/dL 0.6 0.8 1.0  Alkaline Phos 38 - 126 U/L 90 91 90  AST 15 - 41 U/L 68(H) 23 30  ALT 0 - 44 U/L 48(H) 30 38    Imaging studies: No new pertinent imaging studies   Assessment/Plan:  83 y.o. female with gallstone pancreatitis 1 Day Post-Op s/p robotic assisted lap cholecystectomy, complicated by pertinent comorbidities including pneumonia by imaging but not clinically,  hypertension, hyperlipidemia.  Patient tolerated surgery well. Today no pain on the abdomen during my evaluation. She is reporting that is feeling wonderful. Labs shows normal bilirubin, normal lipase. No further surgical management. If medically stable, patient can be discharge from surgery standpoint. Pain medications at home ordered and I will follow at my clinic in 2 weeks.   Arnold Long, MD

## 2019-01-07 NOTE — Discharge Summary (Signed)
[facility] Discharge Summary   Patient: Madeline Taylor LJQ:[id number]   PCP: Madeline Manila, NP (Inactive) DOB: 04/12/1935   Date of admission: 12/31/2018   Date of discharge: 01/06/2019     Discharge Diagnoses:  Principal diagnosis Acute gallstone pancreatitis  Active Problems:   Essential hypertension   Pancreatitis   Gallstone   Multifocal pneumonia   Malnutrition of moderate degree   Admitted From: home Disposition:  Home   Recommendations for Outpatient Follow-up:  1. PCP: please ensure pt has appropriate follow up with General surgery 2. Follow up LABS/TEST:  none  Follow-up Information    Madeline Shiver, MD Follow up in 2 week(s).   Specialty: General Surgery Contact information: [address] [place] [address] [phone]        Madeline Manila, NP. Schedule an appointment as soon as possible for a visit in 1 week(s).   Specialty: Nurse Practitioner Contact information: [address] [place] [address] [phone]          Diet recommendation: Cardiac diet  Activity: The patient is advised to gradually reintroduce usual activities,as tolerated  Discharge Condition: good  Code Status: Full code   History of present illness: As per the H and P dictated on admission, "Madeline Taylor is a 83 y.o. female with medical history significant of HT, HDL, OA. Two days ago she reports the sudden onset of severe upper abdominal pain with N/V. Due to persistent symptoms she presented to [facility] for evaluation. She left before lab results returned which did reveal an lipase of 3,922! She returns today for persistent pain with N/V and increasing weakness. She describes the pain as severe, piercing to the back. She has not been able to eat and has taken in very little fluid. No prior GI history and no knolwedge of having Gallstones."  Hospital Course:  Summary of her active problems in the hospital is as following. 1. Acute  gallstone pancreatitis most likely passed a Gallstone with cholelithiasis on CT but no CBD dilatation. CT with small gallstones, LFTs improving. Initial Lipase from 3,922  Trending down 33 Pain control Fasting lipid panel shows triglyceride of 254 MRCP not showing any choledocholithiasis -Appreciate surgery input.  Underwent cholecystectomy. Doing well postoperatively.  Eating okay. We will discharge home.  2. Multifocal PNA -on CT, stable.  Clinically doing fine, asx, and on RA. covid negative. Continue IV Rocephin and Zithromax for now Incidental finding, likely pneumonitis than pneumonia from vomiting. Pt does not have any respi findings.   3. HTN -uncontrolled -Continue Avapro - Increase metoprolol to 50 mg twice a day -Also as needed IVhydralazine and labetalol with parameters given  4.  Hypokalemia-repleted  5.  Moderate protein calorie malnutrition In the setting of gallstone pancreatitis. Anticipating improvement after treatment.  Nutrition Problem: Moderate Malnutrition Etiology: social / environmental circumstances(inadequate oral intake) Nutrition Interventions: Interventions: Refer to RD note for recommendations  6. Pain control  - [employer] Controlled Substance Reporting System database was reviewed. -General surgery provided Norco prescription for 3 days - Patient was instructed, not to drive, operate heavy machinery, perform activities at heights, swimming or participation in water activities or provide baby sitting services while on Pain, Sleep and Anxiety Medications; until her outpatient Physician has advised to do so again.  - Also recommended to not to take more than prescribed Pain, Sleep and Anxiety Medications.  7. 1.8 cm left renal mass seen, likely benign adenoma per radiology  Patient was ambulatory without any  assistance. On the day of the discharge the patient's vitals were stable, and no other acute medical condition were reported by  patient. the patient was felt safe to be discharge at home with no therapy needed on discharge.  Consultants: General surgery  Procedures: Lap chole  DISCHARGE MEDICATION: Allergies as of 01/06/2019      Reactions   Lisinopril Cough   Statins       Medication List    TAKE these medications   amoxicillin-clavulanate 875-125 MG tablet Commonly known as: Augmentin Take 1 tablet by mouth 2 (two) times daily for 3 days.   aspirin 81 MG tablet Take 81 mg by mouth daily. What changed: Another medication with the same name was removed. Continue taking this medication, and follow the directions you see here.   HYDROcodone-acetaminophen 5-325 MG tablet Commonly known as: Norco Take 1 tablet by mouth every 4 (four) hours as needed for up to 3 days for moderate pain.   irbesartan 300 MG tablet Commonly known as: AVAPRO Take 1 tablet by mouth once daily   methocarbamol 500 MG tablet Commonly known as: ROBAXIN Take 1 tablet (500 mg total) by mouth every 8 (eight) hours as needed for muscle spasms.   polyethylene glycol 17 g packet Commonly known as: MIRALAX / GLYCOLAX Take 17 g by mouth daily.      Allergies  Allergen Reactions  . Lisinopril Cough  . Statins    Discharge Instructions    Diet - low sodium heart healthy   Complete by: As directed    Discharge instructions   Complete by: As directed    It is important that you read the given instructions as well as go over your medication list with RN to help you understand your care after this hospitalization.  Please follow-up with PCP in 1-2 weeks.  Please note that NO REFILLS for any discharge medications will be authorized once you are discharged, as it is imperative that you return to your primary care physician (or establish a relationship with a primary care physician if you do not have one) for your aftercare needs so that they can reassess your need for medications and monitor your lab values.  Please request your  primary care physician to go over all Hospital Tests and Procedure/Radiological results at the follow up. Please get all Hospital records sent to your PCP by signing hospital release before you go home.   Do not drive, operating heavy machinery, perform activities at heights, swimming or participation in water activities or provide baby sitting services while you are on Pain, Sleep and Anxiety Medications; until you have been seen by Primary Care Physician or a Neurologist and are cleared to do such activities.  Do not take more than prescribed Pain, Sleep and Anxiety Medications.  You were cared for by a hospitalist during your hospital stay. If you have any questions about your discharge medications or the care you received while you were in the hospital after you are discharged, you can call the unit @UNIT @ you were admitted to and ask to speak with the hospitalist Lynden OxfordPranav Phebe Dettmer. Ask for Hospitalist on call if the hospitalist that took care of you is not available.   Once you are discharged, your primary care physician will handle any further medical issues.  You Must read complete instructions/literature along with all the possible adverse reactions/side effects for all the Medicines you take and that have been prescribed to you. Take any new Medicines after you have completely understood  and accept all the possible adverse reactions/side effects.  If you have smoked or chewed Tobacco in the last 2 yrs please STOP smoking If you drink alcohol, please safely STOP the use. Do not drive, operating heavy machinery, perform activities at heights, swimming or participation in water activities or provide baby sitting services under influence.  Wear Seat belts while driving.   Increase activity slowly   Complete by: As directed      Discharge Exam: Filed Weights   12/31/18 1116  Weight: 47.2 kg   Vitals:   01/06/19 0826 01/06/19 1012  BP: (!) 172/73 (!) 162/63  Pulse: 77 72  Resp:    Temp:  97.8 F (36.6 C)   SpO2: 97% 98%   General: Appear in mild distress, no Rash; Oral Mucosa Clear, moist. no Abnormal Mass Or lumps Cardiovascular: S1 and S2 Present, no Murmur, Respiratory: normal respiratory effort, Bilateral Air entry present and Clear to Auscultation, no Crackles, no wheezes Abdomen: Bowel Sound present, Soft and mild tenderness, no hernia Extremities: no Pedal edema, no calf tenderness Neurology: alert and oriented to time, place, and person affect appropriate.  The results of significant diagnostics from this hospitalization (including imaging, microbiology, ancillary and laboratory) are listed below for reference.    Significant Diagnostic Studies: CT ANGIO CHEST PE W OR WO CONTRAST  Result Date: 01/01/2019 CLINICAL DATA:  Elevated D-dimer with shortness of breath. EXAM: CT ANGIOGRAPHY CHEST WITH CONTRAST TECHNIQUE: Multidetector CT imaging of the chest was performed using the standard protocol during bolus administration of intravenous contrast. Multiplanar CT image reconstructions and MIPs were obtained to evaluate the vascular anatomy. CONTRAST:  75mL OMNIPAQUE IOHEXOL 350 MG/ML SOLN COMPARISON:  None. FINDINGS: Cardiovascular: Contrast injection is sufficient to demonstrate satisfactory opacification of the pulmonary arteries to the segmental level. There is no pulmonary embolus. The main pulmonary artery is within normal limits for size. There is no CT evidence of acute right heart strain. There are atherosclerotic changes of the visualized aorta. Heart size is mildly enlarged. There is no significant pericardial effusion. Coronary artery calcifications are noted. Mediastinum/Nodes: --No mediastinal or hilar lymphadenopathy. --No axillary lymphadenopathy. --No supraclavicular lymphadenopathy. --Normal thyroid gland. --The esophagus is unremarkable Lungs/Pleura: There are multifocal areas of consolidation involving the right middle lobe, lingula, and bilateral lower lobes.  The greatest area of involvement is at the left lower lobe. The trachea is unremarkable. There is no pneumothorax. There are trace bilateral pleural effusions. Upper Abdomen: See separate recent CT report for complete details of the upper abdomen. Musculoskeletal: No chest wall abnormality. No acute or significant osseous findings. Review of the MIP images confirms the above findings. IMPRESSION: 1. No evidence for pulmonary embolus. 2. Multifocal areas of consolidation as detailed above consistent with multifocal pneumonia. 3. Trace bilateral pleural effusions. Aortic Atherosclerosis (ICD10-I70.0). Electronically Signed   By: Katherine Mantle M.D.   On: 01/01/2019 00:05   CT ABDOMEN PELVIS W CONTRAST  Result Date: 12/31/2018 CLINICAL DATA:  Lambert Mody lower abdominal pain beginning approximately 3 days ago, also with nausea, vomiting and diarrhea. Elevated lipase. EXAM: CT ABDOMEN AND PELVIS WITH CONTRAST TECHNIQUE: Multidetector CT imaging of the abdomen and pelvis was performed using the standard protocol following bolus administration of intravenous contrast. CONTRAST:  75mL OMNIPAQUE IOHEXOL 300 MG/ML  SOLN COMPARISON:  None. FINDINGS: Lower chest: There is consolidation in the medial base of the right middle lobe with hazy ground-glass opacities noted in both lower lobes and the base of the left upper lobe lingula and smaller  areas of more dense consolidation also noted in the lower lobes, left greater than right. Findings consistent with multifocal pneumonia. Hepatobiliary: Liver normal in size and attenuation. 7 mm low-attenuation at the dome of the left lobe consistent with a cyst. Small amount of focal fat adjacent to the falciform ligament. 8 mm lesion with peripheral enhancement at the dome of the right lobe consistent with a hemangioma. Calcification along the dome of the right lobe. No other liver abnormalities. Dependent gallstones. Gallbladder wall appears slightly prominent, but not overtly  thickened. There is no adjacent inflammation. No bile duct dilation. Pancreas: There is peripancreatic fluid attenuation consistent with inflammation, extends the root of the small bowel mesentery and along the left anterior pararenal fascia. The pancreatic tail appears mildly enlarged an area of heterogeneous attenuation, but no defined mass or cyst. Portal vein, splenic vein and superior mesenteric veins are widely patent. Spleen: Normal in size without focal abnormality. Adrenals/Urinary Tract: 16 mm left adrenal nodule. Relative washout is 28%, indeterminate. No right adrenal mass. Kidneys normal in size, orientation and position. Symmetric renal enhancement and excretion. 1 cm midpole right renal cyst. No other masses, no stones and no hydronephrosis. Normal ureters. Bladder is unremarkable. Stomach/Bowel: Stomach is unremarkable. Small bowel and colon are normal in caliber. No wall thickening. No inflammation. Normal appendix. Vascular/Lymphatic: Aortic atherosclerosis. No aneurysm. No enlarged lymph nodes. Reproductive: Uterus and bilateral adnexa are unremarkable. Other: Trace amount of free fluid in the pelvis. Musculoskeletal: No fracture or acute finding.  No bone lesion. IMPRESSION: 1. Acute pancreatitis. Area of heterogeneous enlargement in the tail of pancreas may reflect early pseudocyst formation. Small area of necrosis is felt less likely. No other evidence suggest necrosis. No formed pseudo cyst or evidence of an abscess. No venous thrombosis. 2. Lung base opacities consistent with multifocal infection. Recommend assessing for COVID-19 infection. 3. No other acute abnormalities. 4. Small gallstones. 5. Aortic atherosclerosis. Electronically Signed   By: Lajean Manes M.D.   On: 12/31/2018 17:49   MR 3D Recon At Scanner  Result Date: 01/03/2019 CLINICAL DATA:  Abdominal pain for 2 days. Nausea. Cholelithiasis. Acute pancreatitis. EXAM: MRI ABDOMEN WITHOUT AND WITH CONTRAST (INCLUDING MRCP)  TECHNIQUE: Multiplanar multisequence MR imaging of the abdomen was performed both before and after the administration of intravenous contrast. Heavily T2-weighted images of the biliary and pancreatic ducts were obtained, and three-dimensional MRCP images were rendered by post processing. CONTRAST:  19mL GADAVIST GADOBUTROL 1 MMOL/ML IV SOLN COMPARISON:  CT on 12/31/2018 FINDINGS: Lower chest: Mild bibasilar atelectasis. Hepatobiliary: Subcentimeter cyst is seen in the central left hepatic lobe. A 1 cm lesion is also seen in the dome of the right lobe which shows T1 hypointensity, T2 isointensity, and no significant contrast enhancement. This appears to show calcifications on recent CT, and is an indeterminate but probably benign lesion. No evidence of steatosis. Tiny gallstones are better visualized on recent CT. No evidence of cholecystitis. No evidence of biliary ductal dilatation with common bile duct measuring 5 mm in diameter. No evidence of choledocholithiasis or biliary stricture. Pancreas: Mild-to-moderate pancreatic edema is seen which is greatest in the pancreatic tail. Mild peripancreatic inflammatory changes seen with minimal amount of peripancreatic fluid. These findings are consistent with acute pancreatitis. No evidence of pancreatic ductal dilatation or pancreas divisum. No evidence of pancreatic mass or pancreatic necrosis. No pseudocysts identified. Spleen:  Within normal limits in size and appearance. Adrenals/Urinary Tract: A 1.8 cm homogeneous left adrenal mass is seen, however no signal dropout is  seen on chemical shift imaging. Small right renal cyst is noted. No evidence of renal mass or hydronephrosis. Stomach/Bowel: Visualized portion unremarkable. Vascular/Lymphatic: No pathologically enlarged lymph nodes identified. No abdominal aortic aneurysm. Other:  None. Musculoskeletal:  No suspicious bone lesions identified. IMPRESSION: 1. Mild to moderate acute pancreatitis. No evidence of  pancreatic necrosis or pseudocyst. 2. Cholelithiasis. No radiographic evidence of cholecystitis. 3. No evidence of biliary ductal dilatation or choledocholithiasis. 4. 1.8 cm indeterminate left renal mass. This most likely represents a benign adenoma in patient without history of cancer. Recommend continued follow-up by MRI in 12 months to confirm stability. This recommendation follows ACR consensus guidelines: Management of Incidental Adrenal Masses: A White Paper of the ACR Incidental Findings Committee. J Am Coll Radiol 2017;14:1038-1044. 5. 1.8 cm indeterminate right lobe lesion, likely benign. Recommend continued attention on follow-up MRI in 12 months. Electronically Signed   By: Danae Orleans M.D.   On: 01/03/2019 08:20   Portable chest 1 View  Result Date: 01/01/2019 CLINICAL DATA:  Bilateral pulmonary infiltrates EXAM: PORTABLE CHEST 1 VIEW COMPARISON:  CT PE study from the same day FINDINGS: Bilateral pulmonary opacities are again noted. There is no pneumothorax. No large pleural effusion. The heart size is enlarged. There is no acute osseous abnormality. IMPRESSION: Bilateral pulmonary opacities as seen on recent CT of the chest, consistent with multifocal pneumonia. Electronically Signed   By: Katherine Mantle M.D.   On: 01/01/2019 00:49   DG Abd Portable 1V  Result Date: 01/04/2019 CLINICAL DATA:  Pancreatitis. EXAM: PORTABLE ABDOMEN - 1 VIEW COMPARISON:  MRI 01/02/2019 FINDINGS: No dilated large or small bowel. There is gas in the rectum. No intraperitoneal free air. No pathologic calcifications identified. LEFT basilar atelectasis. IMPRESSION: No evidence of bowel obstruction.  No free air. LEFT basilar atelectasis. Electronically Signed   By: Genevive Bi M.D.   On: 01/04/2019 11:46   MR ABDOMEN MRCP W WO CONTAST  Result Date: 01/03/2019 CLINICAL DATA:  Abdominal pain for 2 days. Nausea. Cholelithiasis. Acute pancreatitis. EXAM: MRI ABDOMEN WITHOUT AND WITH CONTRAST (INCLUDING  MRCP) TECHNIQUE: Multiplanar multisequence MR imaging of the abdomen was performed both before and after the administration of intravenous contrast. Heavily T2-weighted images of the biliary and pancreatic ducts were obtained, and three-dimensional MRCP images were rendered by post processing. CONTRAST:  4mL GADAVIST GADOBUTROL 1 MMOL/ML IV SOLN COMPARISON:  CT on 12/31/2018 FINDINGS: Lower chest: Mild bibasilar atelectasis. Hepatobiliary: Subcentimeter cyst is seen in the central left hepatic lobe. A 1 cm lesion is also seen in the dome of the right lobe which shows T1 hypointensity, T2 isointensity, and no significant contrast enhancement. This appears to show calcifications on recent CT, and is an indeterminate but probably benign lesion. No evidence of steatosis. Tiny gallstones are better visualized on recent CT. No evidence of cholecystitis. No evidence of biliary ductal dilatation with common bile duct measuring 5 mm in diameter. No evidence of choledocholithiasis or biliary stricture. Pancreas: Mild-to-moderate pancreatic edema is seen which is greatest in the pancreatic tail. Mild peripancreatic inflammatory changes seen with minimal amount of peripancreatic fluid. These findings are consistent with acute pancreatitis. No evidence of pancreatic ductal dilatation or pancreas divisum. No evidence of pancreatic mass or pancreatic necrosis. No pseudocysts identified. Spleen:  Within normal limits in size and appearance. Adrenals/Urinary Tract: A 1.8 cm homogeneous left adrenal mass is seen, however no signal dropout is seen on chemical shift imaging. Small right renal cyst is noted. No evidence of renal mass or hydronephrosis.  Stomach/Bowel: Visualized portion unremarkable. Vascular/Lymphatic: No pathologically enlarged lymph nodes identified. No abdominal aortic aneurysm. Other:  None. Musculoskeletal:  No suspicious bone lesions identified. IMPRESSION: 1. Mild to moderate acute pancreatitis. No evidence of  pancreatic necrosis or pseudocyst. 2. Cholelithiasis. No radiographic evidence of cholecystitis. 3. No evidence of biliary ductal dilatation or choledocholithiasis. 4. 1.8 cm indeterminate left renal mass. This most likely represents a benign adenoma in patient without history of cancer. Recommend continued follow-up by MRI in 12 months to confirm stability. This recommendation follows ACR consensus guidelines: Management of Incidental Adrenal Masses: A White Paper of the ACR Incidental Findings Committee. J Am Coll Radiol 2017;14:1038-1044. 5. 1.8 cm indeterminate right lobe lesion, likely benign. Recommend continued attention on follow-up MRI in 12 months. Electronically Signed   By: Danae Orleans M.D.   On: 01/03/2019 08:20    Microbiology: Recent Results (from the past 240 hour(s))  SARS CORONAVIRUS 2 (TAT 6-24 HRS) Nasopharyngeal Nasopharyngeal Swab     Status: None   Collection Time: 12/31/18  7:42 PM   Specimen: Nasopharyngeal Swab  Result Value Ref Range Status   SARS Coronavirus 2 NEGATIVE NEGATIVE Final    Comment: (NOTE) SARS-CoV-2 target nucleic acids are NOT DETECTED. The SARS-CoV-2 RNA is generally detectable in upper and lower respiratory specimens during the acute phase of infection. Negative results do not preclude SARS-CoV-2 infection, do not rule out co-infections with other pathogens, and should not be used as the sole basis for treatment or other patient management decisions. Negative results must be combined with clinical observations, patient history, and epidemiological information. The expected result is Negative. Fact Sheet for Patients: HairSlick.no Fact Sheet for Healthcare Providers: quierodirigir.com This test is not yet approved or cleared by the [place] FDA and  has been authorized for detection and/or diagnosis of SARS-CoV-2 by FDA under an Emergency Use Authorization (EUA). This EUA will remain  in  effect (meaning this test can be used) for the duration of the COVID-19 declaration under Section 56 4(b)(1) of the Act, 21 U.S.C. section 360bbb-3(b)(1), unless the authorization is terminated or revoked sooner. Performed at [facility], [address] [place], [place] [address]   SARS CORONAVIRUS 2 (TAT 6-24 HRS) Nasopharyngeal Nasopharyngeal Swab     Status: None   Collection Time: 01/01/19  6:15 AM   Specimen: Nasopharyngeal Swab  Result Value Ref Range Status   SARS Coronavirus 2 NEGATIVE NEGATIVE Final    Comment: (NOTE) SARS-CoV-2 target nucleic acids are NOT DETECTED. The SARS-CoV-2 RNA is generally detectable in upper and lower respiratory specimens during the acute phase of infection. Negative results do not preclude SARS-CoV-2 infection, do not rule out co-infections with other pathogens, and should not be used as the sole basis for treatment or other patient management decisions. Negative results must be combined with clinical observations, patient history, and epidemiological information. The expected result is Negative. Fact Sheet for Patients: HairSlick.no Fact Sheet for Healthcare Providers: quierodirigir.com This test is not yet approved or cleared by the [place] FDA and  has been authorized for detection and/or diagnosis of SARS-CoV-2 by FDA under an Emergency Use Authorization (EUA). This EUA will remain  in effect (meaning this test can be used) for the duration of the COVID-19 declaration under Section 56 4(b)(1) of the Act, 21 U.S.C. section 360bbb-3(b)(1), unless the authorization is terminated or revoked sooner. Performed at [facility], [address], [place], [place] [address]      Labs: CBC: Recent Labs  Lab 12/31/18 1118 01/02/19 0805 01/05/19 0258 01/06/19 0324  WBC 10.9* 10.6* 14.0* 12.4*  NEUTROABS  --   --  12.2*  --   HGB 15.1* 12.5 11.6* 10.3*  HCT 47.0* 37.6  33.1* 31.1*  MCV 90.9 90.6 84.9 89.9  PLT 199 164 205 221   Basic Metabolic Panel: Recent Labs  Lab 01/02/19 0805 01/03/19 0230 01/04/19 0326 01/05/19 0258 01/06/19 0324  NA 139 137 138 138 138  K 2.8* 3.4* 3.8 3.9 3.8  CL 106 106 109 107 107  CO2 17* 20* 21* 22 22  GLUCOSE 90 179* 155* 131* 181*  BUN CREATININE 0.53 0.51 0.48 0.50 0.48  CALCIUM 7.7* 7.6* 7.9* 7.6* 7.4*  MG  --   --   --  2.0  --    Liver Function Tests: Recent Labs  Lab 01/02/19 0805 01/03/19 0230 01/04/19 0326 01/05/19 0258 01/06/19 0324  AST 43* 40 30 23 68*  ALT 46* 44 38 30 48*  ALKPHOS 76 83 90 91 90  BILITOT 2.2* 1.7* 1.0 0.8 0.6  PROT 6.8 6.3* 6.1* 5.8* 5.3*  ALBUMIN 3.0* 2.7* 2.6* 2.4* 2.1*   Recent Labs  Lab 01/01/19 0413 01/02/19 0805 01/03/19 0230 01/04/19 0326 01/06/19 0324  LIPASE 92* 29 33 35 30   No results for input(s): AMMONIA in the last 168 hours. Cardiac Enzymes: No results for input(s): CKTOTAL, CKMB, CKMBINDEX, TROPONINI in the last 168 hours. BNP (last 3 results) No results for input(s): BNP in the last 8760 hours. CBG: No results for input(s): GLUCAP in the last 168 hours.  Time spent: 35 minutes  Signed:  Lynden Oxford  [facility] 01/06/2019 8:31 AM

## 2019-01-08 ENCOUNTER — Encounter: Payer: Self-pay | Admitting: Family Medicine

## 2019-01-08 ENCOUNTER — Telehealth: Payer: Self-pay

## 2019-01-08 ENCOUNTER — Other Ambulatory Visit: Payer: Self-pay

## 2019-01-08 ENCOUNTER — Ambulatory Visit (INDEPENDENT_AMBULATORY_CARE_PROVIDER_SITE_OTHER): Payer: Medicare Other | Admitting: Family Medicine

## 2019-01-08 DIAGNOSIS — R109 Unspecified abdominal pain: Secondary | ICD-10-CM

## 2019-01-08 NOTE — Telephone Encounter (Signed)
Transition Care Management Follow-up Telephone Call  Spoke with patients husband- Cici Rodriges     Date of discharge and from where: 01/06/2019 from [facility]  How have you been since you were released from the hospital? "weak, cause she hasnt had any food in 12 days."  Any questions or concerns? Yes  "the pharmacy only had one medication for her when I went to get them from [place]. The only one I received was miralax. They told me they didn't have any more on file. But I see on the discharge summary there are 3 other ones I was supposed to get for her. "   Called pharmacy for patient and was told that they gave patient all 4 of the medications. Augmentin, Miralax,Norco, and Robaxin.   Called patients husband back and again states he only received the miralax, and even questioned it when he checked out and was told that was the only one.   Is requesting new rx's be placed for these so he can go get them for patient as she has been complaining about pain today and also needs her abx.   Also went ahead and scheduled for hospital follow up today at 3:40pm via virtual visit.  Items Reviewed:  Did the pt receive and understand the discharge instructions provided? Yes   Medications obtained and verified? No verified, see above notes about obtaining medications   Any new allergies since your discharge? Yes   Dietary orders reviewed? Yes  Do you have support at home? Yes   Functional Questionnaire: (I = Independent and D = Dependent) ADLs:   Bathing/Dressing- d  Meal Prep- i  Eating- d  Maintaining continence- i  Transferring/Ambulation- i  Managing Meds- d  Follow up appointments reviewed:   PCP Hospital f/u appt confirmed? Yes  Scheduled to see Dr.karamalegos on 01/08/2019 @ 340pm via virtual visit .  [facility] f/u appt confirmed? No    Are transportation arrangements needed? No   If their condition worsens, is the pt aware to call PCP or go to the Emergency  Dept.? Yes  Was the patient provided with contact information for the PCP's office or ED? Yes  Was to pt encouraged to call back with questions or concerns? Yes

## 2019-01-08 NOTE — Progress Notes (Signed)
Patient was given triage advice by CMA today. I did not evaluate patient remotely on phone or provide any new additional medical treatment or diagnosis today. See note below. She had not started the antibiotic and pain medicine post op. They have contact info for surgeon if needed as well, did not see a scheduled follow-up. They cancelled this apt and will start medications, we offered to re-schedule when ready or provided follow-up options sooner if need  Nobie Putnam, [facility] 01/08/2019, 4:33 PM

## 2019-01-08 NOTE — Progress Notes (Signed)
Pt husband stated that the patient is having a lot of discomfort post surgery. He was unable to get her pain medication because it was not available at the pharmacy. I contacted the pharmacy and they informed me that the medication is ready for pick up. I notifed the patient husband and he wanted to cancel/ reschedule the appt. He was going to go and pick up the medication.

## 2019-01-09 LAB — SURGICAL PATHOLOGY

## 2019-01-16 ENCOUNTER — Emergency Department: Payer: Medicare Other

## 2019-01-16 ENCOUNTER — Other Ambulatory Visit: Payer: Self-pay

## 2019-01-16 ENCOUNTER — Inpatient Hospital Stay
Admission: EM | Admit: 2019-01-16 | Discharge: 2019-01-18 | DRG: 438 | Disposition: A | Discharge status: Home / Self Care | Payer: Medicare Other | Attending: Internal Medicine | Admitting: Internal Medicine

## 2019-01-16 DIAGNOSIS — Z8616 Personal history of COVID-19: Secondary | ICD-10-CM

## 2019-01-16 DIAGNOSIS — N393 Stress incontinence (female) (male): Secondary | ICD-10-CM | POA: Diagnosis present

## 2019-01-16 DIAGNOSIS — K8500 Idiopathic acute pancreatitis without necrosis or infection: Secondary | ICD-10-CM | POA: Diagnosis not present

## 2019-01-16 DIAGNOSIS — Z79899 Other long term (current) drug therapy: Secondary | ICD-10-CM

## 2019-01-16 DIAGNOSIS — E871 Hypo-osmolality and hyponatremia: Secondary | ICD-10-CM | POA: Diagnosis present

## 2019-01-16 DIAGNOSIS — Z888 Allergy status to other drugs, medicaments and biological substances status: Secondary | ICD-10-CM | POA: Diagnosis not present

## 2019-01-16 DIAGNOSIS — R531 Weakness: Secondary | ICD-10-CM | POA: Diagnosis not present

## 2019-01-16 DIAGNOSIS — Z803 Family history of malignant neoplasm of breast: Secondary | ICD-10-CM | POA: Diagnosis not present

## 2019-01-16 DIAGNOSIS — K8591 Acute pancreatitis with uninfected necrosis, unspecified: Principal | ICD-10-CM | POA: Diagnosis present

## 2019-01-16 DIAGNOSIS — M479 Spondylosis, unspecified: Secondary | ICD-10-CM | POA: Diagnosis present

## 2019-01-16 DIAGNOSIS — I10 Essential (primary) hypertension: Secondary | ICD-10-CM | POA: Diagnosis not present

## 2019-01-16 DIAGNOSIS — E440 Moderate protein-calorie malnutrition: Secondary | ICD-10-CM | POA: Diagnosis present

## 2019-01-16 DIAGNOSIS — E860 Dehydration: Secondary | ICD-10-CM | POA: Diagnosis present

## 2019-01-16 DIAGNOSIS — Z681 Body mass index (BMI) 19 or less, adult: Secondary | ICD-10-CM

## 2019-01-16 DIAGNOSIS — M8589 Other specified disorders of bone density and structure, multiple sites: Secondary | ICD-10-CM | POA: Diagnosis present

## 2019-01-16 DIAGNOSIS — I700 Atherosclerosis of aorta: Secondary | ICD-10-CM | POA: Diagnosis present

## 2019-01-16 DIAGNOSIS — Z820 Family history of epilepsy and other diseases of the nervous system: Secondary | ICD-10-CM | POA: Diagnosis not present

## 2019-01-16 DIAGNOSIS — Z7982 Long term (current) use of aspirin: Secondary | ICD-10-CM

## 2019-01-16 DIAGNOSIS — U071 COVID-19: Secondary | ICD-10-CM

## 2019-01-16 DIAGNOSIS — E785 Hyperlipidemia, unspecified: Secondary | ICD-10-CM | POA: Diagnosis present

## 2019-01-16 DIAGNOSIS — G8918 Other acute postprocedural pain: Secondary | ICD-10-CM | POA: Diagnosis present

## 2019-01-16 DIAGNOSIS — K859 Acute pancreatitis without necrosis or infection, unspecified: Secondary | ICD-10-CM | POA: Diagnosis not present

## 2019-01-16 DIAGNOSIS — Z9049 Acquired absence of other specified parts of digestive tract: Secondary | ICD-10-CM | POA: Diagnosis not present

## 2019-01-16 HISTORY — DX: Personal history of COVID-19: Z86.16

## 2019-01-16 LAB — BASIC METABOLIC PANEL
Anion gap: 15 (ref 5–15)
BUN: 12 mg/dL (ref 8–23)
CO2: 18 mmol/L — ABNORMAL LOW (ref 22–32)
Calcium: 8.1 mg/dL — ABNORMAL LOW (ref 8.9–10.3)
Chloride: 100 mmol/L (ref 98–111)
Creatinine, Ser: 0.61 mg/dL (ref 0.44–1.00)
GFR calc Af Amer: >60 mL/min (ref >60)
GFR calc non Af Amer: >60 mL/min (ref >60)
Glucose, Bld: 79 mg/dL (ref 70–99)
Potassium: 3.7 mmol/L (ref 3.5–5.1)
Sodium: 133 mmol/L — ABNORMAL LOW (ref 135–145)

## 2019-01-16 LAB — HEPATIC FUNCTION PANEL
ALT: 27 U/L (ref 0–44)
AST: 51 U/L — ABNORMAL HIGH (ref 15–41)
Albumin: 2.5 g/dL — ABNORMAL LOW (ref 3.5–5.0)
Alkaline Phosphatase: 69 U/L (ref 38–126)
Bilirubin, Direct: 0.2 mg/dL (ref 0.0–0.2)
Indirect Bilirubin: 1.1 mg/dL — ABNORMAL HIGH (ref 0.3–0.9)
Total Bilirubin: 1.3 mg/dL — ABNORMAL HIGH (ref 0.3–1.2)
Total Protein: 7 g/dL (ref 6.5–8.1)

## 2019-01-16 LAB — CBC
HCT: 34.1 % — ABNORMAL LOW (ref 36.0–46.0)
Hemoglobin: 11 g/dL — ABNORMAL LOW (ref 12.0–15.0)
MCH: 29.5 pg (ref 26.0–34.0)
MCHC: 32.3 g/dL (ref 30.0–36.0)
MCV: 91.4 fL (ref 80.0–100.0)
Platelets: 377 10*3/uL (ref 150–400)
RBC: 3.73 MIL/uL — ABNORMAL LOW (ref 3.87–5.11)
RDW: 12.1 % (ref 11.5–15.5)
WBC: 8.3 10*3/uL (ref 4.0–10.5)
nRBC: 0 % (ref 0.0–0.2)

## 2019-01-16 LAB — GLUCOSE, CAPILLARY: Glucose-Capillary: 70 mg/dL (ref 70–99)

## 2019-01-16 LAB — LIPASE, BLOOD: Lipase: 102 U/L — ABNORMAL HIGH (ref 11–51)

## 2019-01-16 IMAGING — CT CT ABD-PELV W/ CM
2 of 5 series · 12 of 46 positions shown, 14 images · IV contrast (omnipaque)
Comparison: CT of the abdomen pelvis dated [DATE].
COMPARISON: CT of the abdomen pelvis dated [DATE].

Addendum:
CLINICAL DATA: 83-year-old female with nausea and vomiting. History
of pancreatitis.

EXAM:
CT ABDOMEN AND PELVIS WITH CONTRAST
TECHNIQUE: Multidetector CT imaging of the abdomen and pelvis was performed
using the standard protocol following bolus administration of
intravenous contrast.
CONTRAST:  75mL OMNIPAQUE IOHEXOL 300 MG/ML  SOLN

[Series 2: routine abd/pel with · axial · 0.59mm/px · z∈[-463,-98]mm · 9 of 87 slices shown, 11 images]
[im 7/87  soft-tissue]
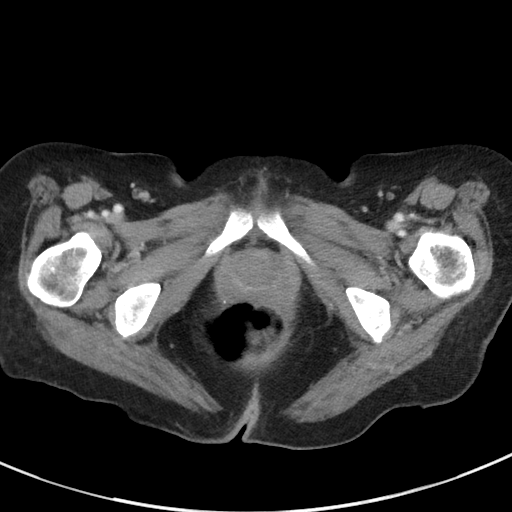
[im 7/87  bone]
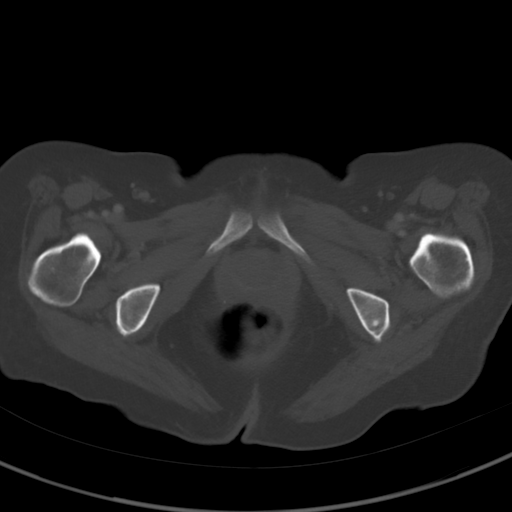
[im 19/87  soft-tissue]
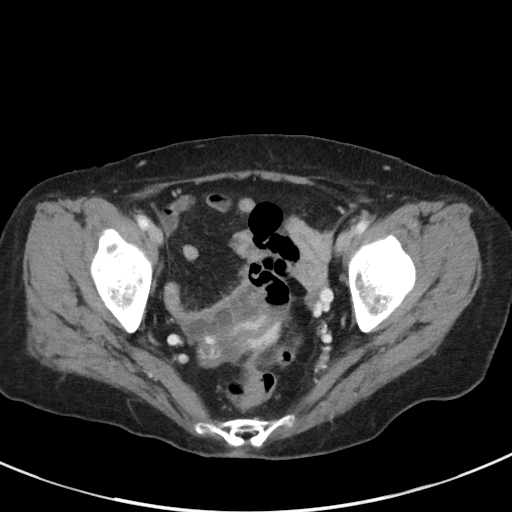
[im 25/87  soft-tissue]
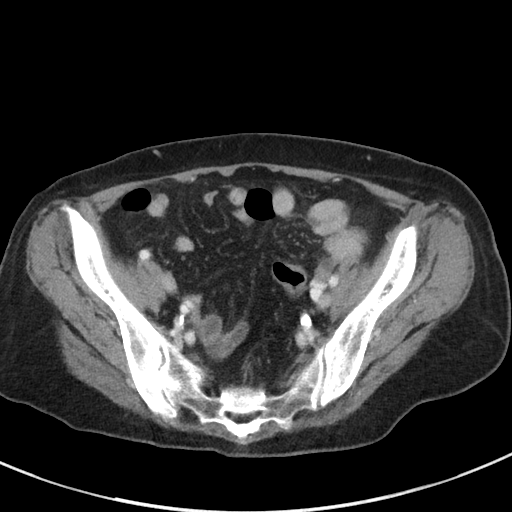
[im 37/87  soft-tissue]
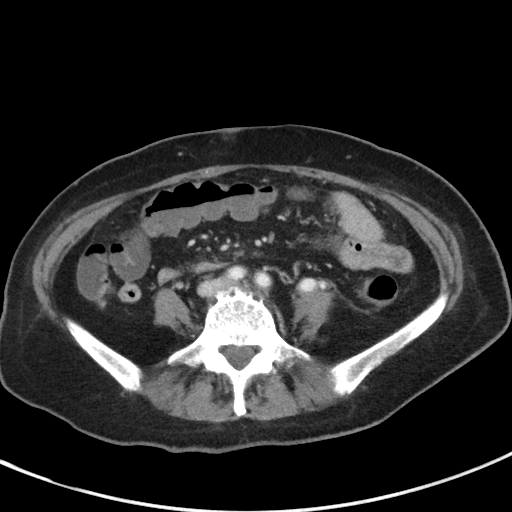
[im 44/87  soft-tissue]
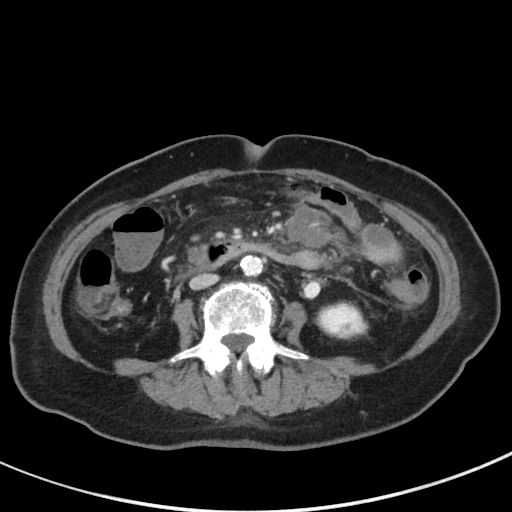
[im 50/87  soft-tissue]
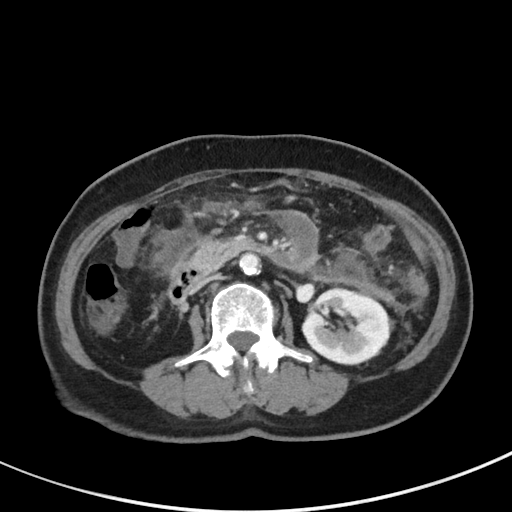
[im 62/87  soft-tissue]
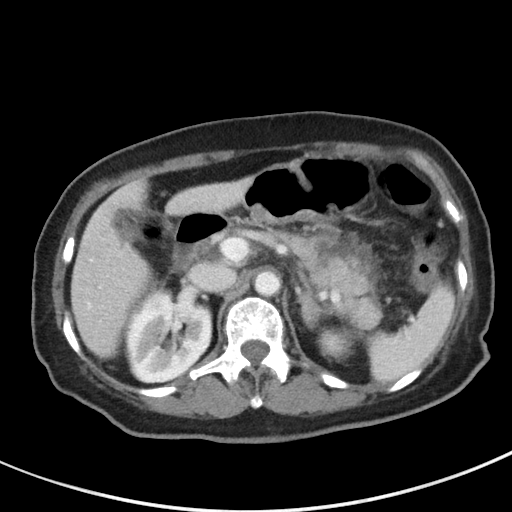
[im 68/87  soft-tissue]
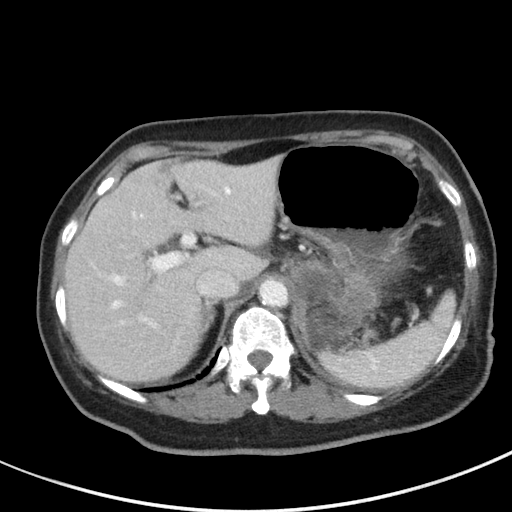
[im 80/87  soft-tissue]
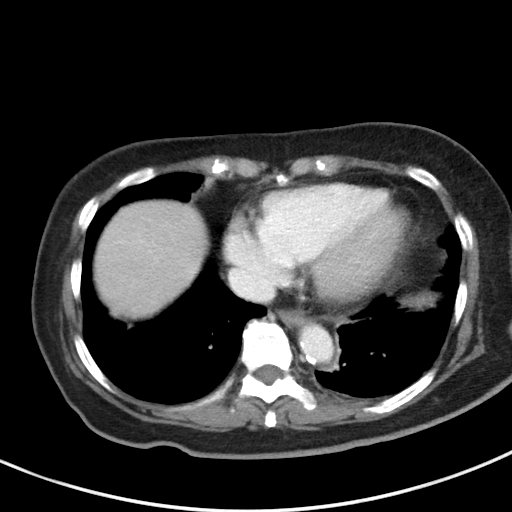
[im 80/87  bone]
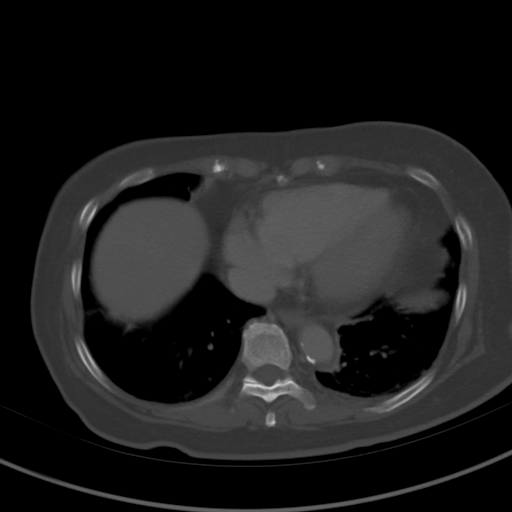

[Series 5: coronal st · coronal · 0.59mm/px · 3 of 72 slices shown]
[im 24/72  soft-tissue]
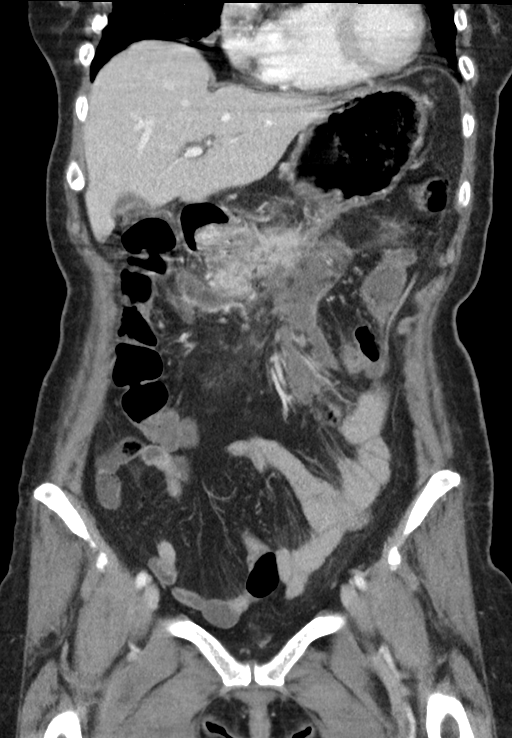
[im 32/72  soft-tissue]
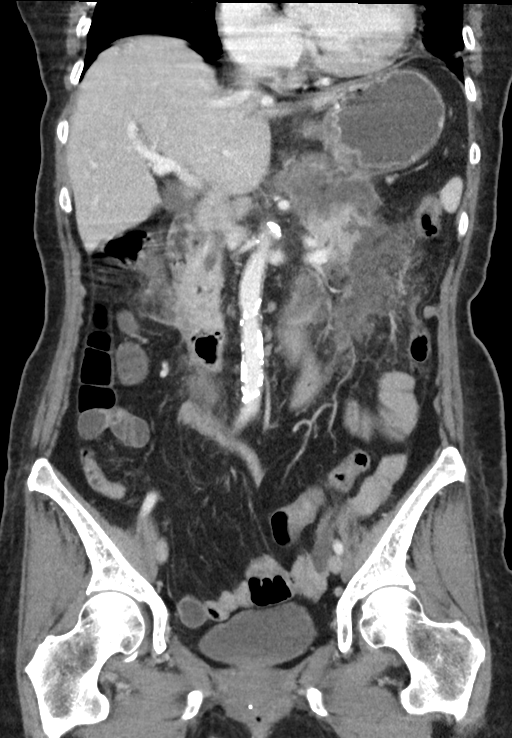
[im 40/72  soft-tissue]
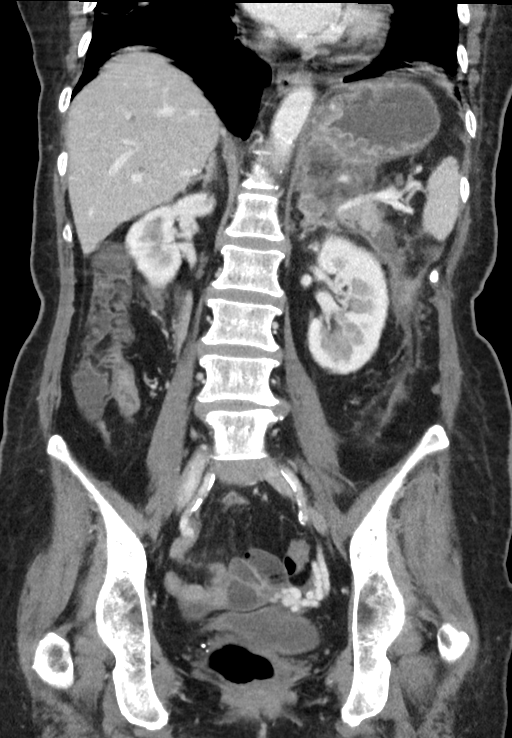

[12 of 46 positions shown; findings below may reference images not displayed]

FINDINGS: Lower chest: There are bibasilar linear atelectasis/scarring. There
is mild cardiomegaly. Coronary vascular calcification primarily
involving the RCA noted.

No intra-abdominal free air. No free fluid.

Hepatobiliary: Indeterminate 8 mm hypodense focus in the dome of the
liver similar to prior CT. Additional subcentimeter hypodensity in
the superior left lobe of the liver is not characterized, likely
cyst. Focal area of fatty infiltration adjacent to the falciform
ligament. The gallbladder is partially contracted and grossly
unremarkable. No calcified gallstone or pericholecystic fluid.

Pancreas: There is extensive peripancreatic inflammatory changes
with areas of fat necrosis and pseudocyst formation. Overall
significant interval increase in the peripancreatic inflammatory
changes and fat necrosis since the prior CT. No drainable fluid
collection identified at this time.

Spleen: Normal in size without focal abnormality.

Adrenals/Urinary Tract: Mild left adrenal thickening or
indeterminate left adrenal nodule similar to prior CT. The right
adrenal gland is unremarkable. There is no hydronephrosis on either
side. There is symmetric enhancement and excretion of contrast by
both kidneys. Subcentimeter right renal hypodense focus is not
characterized. The visualized ureters and urinary bladder appear
unremarkable.

Stomach/Bowel: Thickened appearance of the inferior gastric wall and
greater curvature of the stomach, reactive to inflammatory changes
of the pancreas. There is no bowel obstruction. Loose stool
throughout the colon consistent with diarrheal state. The appendix
is normal.

Vascular/Lymphatic: Moderate aortoiliac atherosclerotic disease. The
IVC is unremarkable. No portal venous gas. There is no adenopathy.

Reproductive: The uterus is grossly unremarkable. There is a 15 mm
left adnexal cystic structure which may represent an ovarian or
paraovarian cyst.

Other: None

Musculoskeletal: Degenerative changes of the spine. No acute osseous
pathology. Grade 1 L4-L5 anterolisthesis.
IMPRESSION: 1. Acute pancreatitis with interval increase in the peripancreatic
inflammatory changes and fat necrosis since the prior CT. No
drainable fluid collection identified at this time.
2. Diarrheal state. No bowel obstruction. Normal appendix.
3. Aortic Atherosclerosis ([8I]-[8I]).

ADDENDUM:
Please note the gallbladder is surgically absent. Minimal low
attenuating density along the surgical bed, postoperative. No
drainable fluid collection.

*** End of Addendum ***
FINDINGS: Lower chest: There are bibasilar linear atelectasis/scarring. There
is mild cardiomegaly. Coronary vascular calcification primarily
involving the RCA noted.

No intra-abdominal free air. No free fluid.

Hepatobiliary: Indeterminate 8 mm hypodense focus in the dome of the
liver similar to prior CT. Additional subcentimeter hypodensity in
the superior left lobe of the liver is not characterized, likely
cyst. Focal area of fatty infiltration adjacent to the falciform
ligament. The gallbladder is partially contracted and grossly
unremarkable. No calcified gallstone or pericholecystic fluid.

Pancreas: There is extensive peripancreatic inflammatory changes
with areas of fat necrosis and pseudocyst formation. Overall
significant interval increase in the peripancreatic inflammatory
changes and fat necrosis since the prior CT. No drainable fluid
collection identified at this time.

Spleen: Normal in size without focal abnormality.

Adrenals/Urinary Tract: Mild left adrenal thickening or
indeterminate left adrenal nodule similar to prior CT. The right
adrenal gland is unremarkable. There is no hydronephrosis on either
side. There is symmetric enhancement and excretion of contrast by
both kidneys. Subcentimeter right renal hypodense focus is not
characterized. The visualized ureters and urinary bladder appear
unremarkable.

Stomach/Bowel: Thickened appearance of the inferior gastric wall and
greater curvature of the stomach, reactive to inflammatory changes
of the pancreas. There is no bowel obstruction. Loose stool
throughout the colon consistent with diarrheal state. The appendix
is normal.

Vascular/Lymphatic: Moderate aortoiliac atherosclerotic disease. The
IVC is unremarkable. No portal venous gas. There is no adenopathy.

Reproductive: The uterus is grossly unremarkable. There is a 15 mm
left adnexal cystic structure which may represent an ovarian or
paraovarian cyst.

Other: None

Musculoskeletal: Degenerative changes of the spine. No acute osseous
pathology. Grade 1 L4-L5 anterolisthesis.
IMPRESSION: 1. Acute pancreatitis with interval increase in the peripancreatic
inflammatory changes and fat necrosis since the prior CT. No
drainable fluid collection identified at this time.
2. Diarrheal state. No bowel obstruction. Normal appendix.
3. Aortic Atherosclerosis ([8I]-[8I]).

## 2019-01-16 MED ORDER — METOPROLOL TARTRATE 25 MG PO TABS
25.0000 mg | ORAL_TABLET | Freq: Two times a day (BID) | ORAL | Status: DC
  Administered 2019-01-16 – 2019-01-18 (×5): 25 mg via ORAL
  Filled 2019-01-16 (×5): qty 1

## 2019-01-16 MED ORDER — ONDANSETRON HCL 4 MG PO TABS: 4.0000 mg | ORAL_TABLET | Freq: Four times a day (QID) | ORAL | Status: DC | PRN

## 2019-01-16 MED ORDER — ONDANSETRON HCL 4 MG/2ML IJ SOLN
4.0000 mg | Freq: Once | INTRAMUSCULAR | Status: AC
  Administered 2019-01-16: 13:00:00 4 mg via INTRAVENOUS
  Filled 2019-01-16: qty 2

## 2019-01-16 MED ORDER — SODIUM CHLORIDE 0.9 % IV SOLN: Freq: Once | INTRAVENOUS | Status: AC

## 2019-01-16 MED ORDER — MORPHINE SULFATE (PF) 4 MG/ML IV SOLN
4.0000 mg | Freq: Once | INTRAVENOUS | Status: AC
  Administered 2019-01-16: 13:00:00 4 mg via INTRAVENOUS
  Filled 2019-01-16: qty 1

## 2019-01-16 MED ORDER — DOCUSATE SODIUM 100 MG PO CAPS
100.0000 mg | ORAL_CAPSULE | Freq: Two times a day (BID) | ORAL | Status: DC
  Administered 2019-01-16 – 2019-01-18 (×4): 100 mg via ORAL
  Filled 2019-01-16 (×4): qty 1

## 2019-01-16 MED ORDER — TRAZODONE HCL 50 MG PO TABS: 25.0000 mg | ORAL_TABLET | Freq: Every evening | ORAL | Status: DC | PRN

## 2019-01-16 MED ORDER — BISACODYL 5 MG PO TBEC: 5.0000 mg | DELAYED_RELEASE_TABLET | Freq: Every day | ORAL | Status: DC | PRN

## 2019-01-16 MED ORDER — SODIUM CHLORIDE 0.9 % IV SOLN: INTRAVENOUS | Status: DC

## 2019-01-16 MED ORDER — IOHEXOL 300 MG/ML  SOLN
75.0000 mL | Freq: Once | INTRAMUSCULAR | Status: AC | PRN
  Administered 2019-01-16: 13:00:00 75 mL via INTRAVENOUS

## 2019-01-16 MED ORDER — ACETAMINOPHEN 650 MG RE SUPP: 650.0000 mg | Freq: Four times a day (QID) | RECTAL | Status: DC | PRN

## 2019-01-16 MED ORDER — HYDROCODONE-ACETAMINOPHEN 5-325 MG PO TABS
1.0000 | ORAL_TABLET | ORAL | Status: DC | PRN
  Administered 2019-01-17: 2 via ORAL
  Administered 2019-01-17: 1 via ORAL
  Filled 2019-01-16 (×2): qty 2

## 2019-01-16 MED ORDER — HEPARIN SODIUM (PORCINE) 5000 UNIT/ML IJ SOLN
5000.0000 [IU] | Freq: Three times a day (TID) | INTRAMUSCULAR | Status: DC
  Administered 2019-01-16 – 2019-01-18 (×5): 5000 [IU] via SUBCUTANEOUS
  Filled 2019-01-16 (×5): qty 1

## 2019-01-16 MED ORDER — ONDANSETRON HCL 4 MG/2ML IJ SOLN: 4.0000 mg | Freq: Four times a day (QID) | INTRAMUSCULAR | Status: DC | PRN

## 2019-01-16 MED ORDER — ACETAMINOPHEN 325 MG PO TABS
650.0000 mg | ORAL_TABLET | Freq: Four times a day (QID) | ORAL | Status: DC | PRN
  Administered 2019-01-17: 21:00:00 650 mg via ORAL
  Filled 2019-01-16 (×2): qty 2

## 2019-01-16 MED ORDER — SODIUM CHLORIDE 0.9% FLUSH
3.0000 mL | Freq: Once | INTRAVENOUS | Status: AC
  Administered 2019-01-16: 12:00:00 3 mL via INTRAVENOUS

## 2019-01-16 NOTE — ED Notes (Signed)
BEHAVIORAL HEALTH ROUNDING Patient sleeping: No. Patient alert and oriented: yes Behavior appropriate: Yes.  ; If no, describe:  Nutrition and fluids offered: yes Toileting and hygiene offered: Yes  Sitter present: q15 minute observations  Law enforcement present: Yes [employer]  

## 2019-01-16 NOTE — H&P (Signed)
[facility] NAME: Madeline Taylor    MR#:  [id number]  DATE OF BIRTH:  1935/12/08  DATE OF ADMISSION:  01/16/2019  PRIMARY CARE PHYSICIAN: Mikey College, NP (Inactive)   REQUESTING/REFERRING PHYSICIAN: Earleen Newport, MD  CHIEF COMPLAINT:   Chief Complaint  Patient presents with  . Weakness  . Post-op Problem    HISTORY OF PRESENT ILLNESS:  Madeline Taylor  is a 83 y.o. female with a known history of hypertension, vertigo, hyperlipidemia  is being admitted for acute pancreatitis.  Patient presented to the emergency department for generalized weakness.  Patient had her gallbladder removed on 18 December she reports increased nausea with vomiting for the past 24 hours.  Patient states she has not felt well after being discharged from the hospital.  Pain medication given in the emergency department seem to have helped ease her pain.  Her pain is about 2 out of 10 right now.  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 PAST MEDICAL HISTORY:   Past Medical History:  Diagnosis Date  . Essential hypertension 07/29/2016  . History of vertigo 07/29/2016  . Hyperlipidemia 07/29/2016  . Osteoarthritis of back 07/29/2016  . Osteopenia of multiple sites 07/29/2016  . Seasonal allergies 07/29/2016  . Urinary, incontinence, stress female 07/29/2016    PAST SURGICAL HISTORY:   Past Surgical History:  Procedure Laterality Date  . no past surgery      SOCIAL HISTORY:   Social History   Tobacco Use  . Smoking status: Never Smoker  . Smokeless tobacco: Never Used  Substance Use Topics  . Alcohol use: No    FAMILY HISTORY:   Family History  Problem Relation Age of Onset  . Breast cancer Sister   . Cancer Sister 61       breast cancer  . Alzheimer's disease Mother   . Healthy Father     DRUG ALLERGIES:   Allergies  Allergen Reactions  . Lisinopril Cough  . Statins     REVIEW OF SYSTEMS:   Review of Systems    Constitutional: Negative for diaphoresis, fever, malaise/fatigue and weight loss.  HENT: Negative for ear discharge, ear pain, hearing loss, nosebleeds, sore throat and tinnitus.   Eyes: Negative for blurred vision and pain.  Respiratory: Negative for cough, hemoptysis, shortness of breath and wheezing.   Cardiovascular: Negative for chest pain, palpitations, orthopnea and leg swelling.  Gastrointestinal: Positive for abdominal pain. Negative for blood in stool, constipation, diarrhea, heartburn, nausea and vomiting.  Genitourinary: Negative for dysuria, frequency and urgency.  Musculoskeletal: Negative for back pain and myalgias.  Skin: Negative for itching and rash.  Neurological: Negative for dizziness, tingling, tremors, focal weakness, seizures, weakness and headaches.  Psychiatric/Behavioral: Negative for depression. The patient is not nervous/anxious.     MEDICATIONS AT HOME:   Prior to Admission medications   Medication Sig Start Date End Date Taking? Authorizing Provider  aspirin 81 MG tablet Take 81 mg by mouth daily.    [provider]  irbesartan (AVAPRO) 300 MG tablet Take 1 tablet by mouth once daily Patient not taking: Reported on 01/08/2019 10/25/18   Mikey College, NP  methocarbamol (ROBAXIN) 500 MG tablet Take 1 tablet (500 mg total) by mouth every 8 (eight) hours as needed for muscle spasms. Patient not taking: Reported on 01/08/2019 01/06/19   Lavina Hamman, MD  polyethylene glycol (MIRALAX / GLYCOLAX) 17 g packet Take 17 g by mouth daily. Patient  not taking: Reported on 01/08/2019 01/07/19   Rolly Salter, MD      VITAL SIGNS:  Blood pressure (!) 150/67, pulse 84, temperature 99.8 F (37.7 C), temperature source Oral, resp. rate 18, height 5' (1.524 m), weight 42.2 kg, SpO2 99 %.  PHYSICAL EXAMINATION:  Physical Exam  GENERAL:  83 y.o.-year-old patient lying in the bed with no acute distress.  EYES: Pupils equal, round, reactive to light  and accommodation. No scleral icterus. Extraocular muscles intact.  HEENT: Head atraumatic, normocephalic. Oropharynx and nasopharynx clear.  NECK:  Supple, no jugular venous distention. No thyroid enlargement, no tenderness.  LUNGS: Normal breath sounds bilaterally, no wheezing, rales,rhonchi or crepitation. No use of accessory muscles of respiration.  CARDIOVASCULAR: S1, S2 normal. No murmurs, rubs, or gallops.  ABDOMEN: Soft with nonfocal tenderness, nondistended. Bowel sounds present. No organomegaly or mass.  EXTREMITIES: No pedal edema, cyanosis, or clubbing.  NEUROLOGIC: Cranial nerves II through XII are intact. Muscle strength 5/5 in all extremities. Sensation intact. Gait not checked.  PSYCHIATRIC: The patient is alert and oriented x 3.  SKIN: No obvious rash, lesion, or ulcer.   LABORATORY PANEL:   CBC Recent Labs  Lab 01/16/19 1231  WBC 8.3  HGB 11.0*  HCT 34.1*  PLT 377   ------------------------------------------------------------------------------------------------------------------  Chemistries  Recent Labs  Lab 01/16/19 1231  NA 133*  K 3.7  CL 100  CO2 18*  GLUCOSE 79  BUN 12  CREATININE 0.61  CALCIUM 8.1*  AST 51*  ALT 27  ALKPHOS 69  BILITOT 1.3*   ------------------------------------------------------------------------------------------------------------------  Cardiac Enzymes No results for input(s): TROPONINI in the last 168 hours. ------------------------------------------------------------------------------------------------------------------  RADIOLOGY:  CT ABDOMEN PELVIS W CONTRAST  Addendum Date: 01/16/2019   ADDENDUM REPORT: 01/16/2019 14:42 ADDENDUM: Please note the gallbladder is surgically absent. Minimal low attenuating density along the surgical bed, postoperative. No drainable fluid collection. Electronically Signed   By: Elgie Collard M.D.   On: 01/16/2019 14:42   Result Date: 01/16/2019 CLINICAL DATA:  83 year old female  with nausea and vomiting. History of pancreatitis. EXAM: CT ABDOMEN AND PELVIS WITH CONTRAST TECHNIQUE: Multidetector CT imaging of the abdomen and pelvis was performed using the standard protocol following bolus administration of intravenous contrast. CONTRAST:  85mL OMNIPAQUE IOHEXOL 300 MG/ML  SOLN COMPARISON:  CT of the abdomen pelvis dated 12/31/2018. FINDINGS: Lower chest: There are bibasilar linear atelectasis/scarring. There is mild cardiomegaly. Coronary vascular calcification primarily involving the RCA noted. No intra-abdominal free air. No free fluid. Hepatobiliary: Indeterminate 8 mm hypodense focus in the dome of the liver similar to prior CT. Additional subcentimeter hypodensity in the superior left lobe of the liver is not characterized, likely cyst. Focal area of fatty infiltration adjacent to the falciform ligament. The gallbladder is partially contracted and grossly unremarkable. No calcified gallstone or pericholecystic fluid. Pancreas: There is extensive peripancreatic inflammatory changes with areas of fat necrosis and pseudocyst formation. Overall significant interval increase in the peripancreatic inflammatory changes and fat necrosis since the prior CT. No drainable fluid collection identified at this time. Spleen: Normal in size without focal abnormality. Adrenals/Urinary Tract: Mild left adrenal thickening or indeterminate left adrenal nodule similar to prior CT. The right adrenal gland is unremarkable. There is no hydronephrosis on either side. There is symmetric enhancement and excretion of contrast by both kidneys. Subcentimeter right renal hypodense focus is not characterized. The visualized ureters and urinary bladder appear unremarkable. Stomach/Bowel: Thickened appearance of the inferior gastric wall and greater curvature of the stomach, reactive  to inflammatory changes of the pancreas. There is no bowel obstruction. Loose stool throughout the colon consistent with diarrheal state.  The appendix is normal. Vascular/Lymphatic: Moderate aortoiliac atherosclerotic disease. The IVC is unremarkable. No portal venous gas. There is no adenopathy. Reproductive: The uterus is grossly unremarkable. There is a 15 mm left adnexal cystic structure which may represent an ovarian or paraovarian cyst. Other: None Musculoskeletal: Degenerative changes of the spine. No acute osseous pathology. Grade 1 L4-L5 anterolisthesis. IMPRESSION: 1. Acute pancreatitis with interval increase in the peripancreatic inflammatory changes and fat necrosis since the prior CT. No drainable fluid collection identified at this time. 2. Diarrheal state. No bowel obstruction. Normal appendix. 3. Aortic Atherosclerosis (ICD10-I70.0). Electronically Signed: By: Elgie CollardArash  Radparvar M.D. On: 01/16/2019 13:43   IMPRESSION AND PLAN:  83 year old female being admitted for acute pancreatitis  *Acute pancreatitis -recent cholecystectomy on 12/18 N.p.o. except meds and sips of water Monitor pancreatic enzymes  2.  Hyponatremia Likely due to dehydration.  Start IV hydration  3. HTN  Start metoprolol  4. Moderate protein calorie malnutrition Monitor   Covid test pending   All the records are reviewed and case discussed with ED provider. Management plans discussed with the patient, nursing and they are in agreement.  CODE STATUS: Full code  TOTAL TIME TAKING CARE OF THIS PATIENT: 45 minutes.    Delfino LovettVipul Nalda Shackleford M.D on 01/16/2019 at 2:46 PM  Between 7am to 6pm - Pager - [phone]  After 6pm go to [email or web address] - password [email or web address]  [facility]   CC: Primary care physician; Galen ManilaKennedy, Lauren Renee, NP (Inactive)   Note: This dictation was prepared with Dragon dictation along with smaller phrase technology. Any transcriptional errors that result from this process are unintentional.

## 2019-01-16 NOTE — ED Notes (Signed)
Spoke w/ pt's husband Sula Soda and updated him on his wife's room assignment.

## 2019-01-16 NOTE — ED Notes (Signed)
Provided pt w/ phone to call husband.

## 2019-01-16 NOTE — ED Triage Notes (Signed)
She arrives today via [facility] from home with reports of generalized weakness  Pt had gall bladder removed on 12/19 and she reports increased nausea with vomiting in the last 24 hours  Pt presents alert and oriented   She appears pale and tired

## 2019-01-16 NOTE — ED Notes (Signed)
Assisted pt to the hallway bathroom in a wheelchair.

## 2019-01-16 NOTE — Consult Note (Signed)
SURGICAL CONSULTATION NOTE   HISTORY OF PRESENT ILLNESS (HPI):  83 y.o. female presented to [facility] ED for evaluation of generalized weakness since yesterday. Patient reports associated nausea and vomiting and abdominal pain.  There is no pain radiation.  Pain is localized to the epigastric area.  Pain is intermittent.  The history was very inconsistent.  Sometime the patient denied abdominal pains alert time the patient reports having intermittent abdominal pain.  She also mentioned that she is here because her husband has still been able to take care of her due to his medical conditions including stroke.  In the ED she had labs that shows no leukocytosis and stable hemoglobin.  Normal platelet level.  There is no significant electrolyte abnormality.  The lipase is elevated to 102.  Normal AST/ALT, normal alkaline phosphatase, elevated total bilirubin (1.3) which is almost completely indirect bilirubin (1.1).  CT scan of the abdomen was done showing worsening peripancreatic inflammation with fat necrosis and fluid.  I personally evaluated the images.  The fluid was reported as pseudocyst formation but at this point of the pancreatitis without well formed wall that should be considered acute necrotic collection.  He was also initially reported as contracting the bladder but the antibiotic was removed 10 days ago.  Surgery is consulted by Dr. Manuella Ghazi in this context for evaluation and management of acute pancreatitis.  PAST MEDICAL HISTORY (PMH):  Past Medical History:  Diagnosis Date  . Essential hypertension 07/29/2016  . History of vertigo 07/29/2016  . Hyperlipidemia 07/29/2016  . Osteoarthritis of back 07/29/2016  . Osteopenia of multiple sites 07/29/2016  . Seasonal allergies 07/29/2016  . Urinary, incontinence, stress female 07/29/2016     PAST SURGICAL HISTORY [facility]):  Past Surgical History:  Procedure Laterality Date  . no past surgery       MEDICATIONS:  Prior to Admission medications    Medication Sig Start Date End Date Taking? Authorizing Provider  aspirin 81 MG tablet Take 81 mg by mouth daily.   Yes [provider]  irbesartan (AVAPRO) 300 MG tablet Take 1 tablet by mouth once daily Patient not taking: Reported on 01/08/2019 10/25/18   Mikey College, NP  methocarbamol (ROBAXIN) 500 MG tablet Take 1 tablet (500 mg total) by mouth every 8 (eight) hours as needed for muscle spasms. Patient not taking: Reported on 01/08/2019 01/06/19   Lavina Hamman, MD  polyethylene glycol (MIRALAX / GLYCOLAX) 17 g packet Take 17 g by mouth daily. Patient not taking: Reported on 01/08/2019 01/07/19   Lavina Hamman, MD     ALLERGIES:  Allergies  Allergen Reactions  . Lisinopril Cough  . Statins      SOCIAL HISTORY:  Social History   Socioeconomic History  . Marital status: Married    Spouse name: Not on file  . Number of children: Not on file  . Years of education: Not on file  . Highest education level: 12th grade  Occupational History  . Not on file  Tobacco Use  . Smoking status: Never Smoker  . Smokeless tobacco: Never Used  Substance and Sexual Activity  . Alcohol use: No  . Drug use: No  . Sexual activity: Not Currently    Birth control/protection: None, Post-menopausal  Other Topics Concern  . Not on file  Social History Narrative  . Not on file   Social Determinants of Health   Financial Resource Strain:   . Difficulty of Paying Living Expenses: Not on file  Food Insecurity:   .  Worried About Programme researcher, broadcasting/film/video in the Last Year: Not on file  . Ran Out of Food in the Last Year: Not on file  Transportation Needs:   . Lack of Transportation (Medical): Not on file  . Lack of Transportation (Non-Medical): Not on file  Physical Activity:   . Days of Exercise per Week: Not on file  . Minutes of Exercise per Session: Not on file  Stress:   . Feeling of Stress : Not on file  Social Connections:   . Frequency of Communication with Friends  and Family: Not on file  . Frequency of Social Gatherings with Friends and Family: Not on file  . Attends Religious Services: Not on file  . Active Member of Clubs or Organizations: Not on file  . Attends Banker Meetings: Not on file  . Marital Status: Not on file  Intimate Partner Violence:   . Fear of Current or Ex-Partner: Not on file  . Emotionally Abused: Not on file  . Physically Abused: Not on file  . Sexually Abused: Not on file    The patient currently resides (home / rehab facility / nursing home): Home The patient normally is (ambulatory / bedbound): Ambulatory   FAMILY HISTORY:  Family History  Problem Relation Age of Onset  . Breast cancer Sister   . Cancer Sister 57       breast cancer  . Alzheimer's disease Mother   . Healthy Father      REVIEW OF SYSTEMS:  Constitutional: denies weight loss, fever, chills, or sweats.  Positive for weakness Eyes: denies any other vision changes, history of eye injury  ENT: denies sore throat, hearing problems  Respiratory: denies shortness of breath, wheezing  Cardiovascular: denies chest pain, palpitations  Gastrointestinal: Positive for abdominal pain nausea and vomiting genitourinary: denies burning with urination or urinary frequency Musculoskeletal: denies any other joint pains or cramps  Skin: denies any other rashes or skin discolorations  Neurological: denies any other headache, dizziness Psychiatric: denies any other depression, anxiety   All other review of systems were negative   VITAL SIGNS:  Temp:  [98.2 F (36.8 C)-99.8 F (37.7 C)] 98.2 F (36.8 C) (12/29 1636) Pulse Rate:  [84-87] 87 (12/29 1636) Resp:  [18-20] 20 (12/29 1636) BP: (150-151)/(67-78) 151/78 (12/29 1636) SpO2:  [94 %-99 %] 94 % (12/29 1636) Weight:  [42.2 kg] 42.2 kg (12/29 1210)     Height: 5' (152.4 cm) Weight: 42.2 kg BMI (Calculated): 18.16   INTAKE/OUTPUT:  This shift: Total I/O In: 1000 [I.V.:1000] Out: 300  [Urine:300]  Last 2 shifts: @   PHYSICAL EXAM:  Constitutional:  -- Normal body habitus  -- Awake, alert, and no distress Eyes:  -- Pupils equally round and reactive to light  -- No scleral icterus  Ear, nose, and throat:  -- No jugular venous distension  Pulmonary:  -- No crackles  -- Equal breath sounds bilaterally -- Breathing non-labored at rest Cardiovascular:  -- S1, S2 present  -- No pericardial rubs Gastrointestinal:  -- Abdomen soft, nontender, non-distended, no guarding or rebound tenderness.  Wounds are well healed -- No abdominal masses appreciated, pulsatile or otherwise  Musculoskeletal and Integumentary:  -- Wounds or skin discoloration: None appreciated -- Extremities: B/L UE and LE FROM, hands and feet warm, no edema  Neurologic:  -- Motor function: intact and symmetric -- Sensation: intact and symmetric   Labs:  CBC Latest Ref Rng & Units 01/16/2019 01/06/2019 01/05/2019  WBC 4.0 -  10.5 K/uL 8.3 12.4(H) 14.0(H)  Hemoglobin 12.0 - 15.0 g/dL 11.0(L) 10.3(L) 11.6(L)  Hematocrit 36.0 - 46.0 % 34.1(L) 31.1(L) 33.1(L)  Platelets 150 - 400 K/uL 377 221 205   CMP Latest Ref Rng & Units 01/16/2019 01/06/2019 01/05/2019  Glucose 70 - 99 mg/dL 79 161(W181(H) 960(A131(H)  BUN 8 - 23 mg/dL 12 10 9   Creatinine 0.44 - 1.00 mg/dL 5.400.61 9.810.48 1.910.50  Sodium 135 - 145 mmol/L 133(L) 138 138  Potassium 3.5 - 5.1 mmol/L 3.7 3.8 3.9  Chloride 98 - 111 mmol/L 100 107 107  CO2 22 - 32 mmol/L 18(L) 22 22  Calcium 8.9 - 10.3 mg/dL 8.1(L) 7.4(L) 7.6(L)  Total Protein 6.5 - 8.1 g/dL 7.0 4.7(W5.3(L) 2.9(F5.8(L)  Total Bilirubin 0.3 - 1.2 mg/dL 6.2(Z1.3(H) 0.6 0.8  Alkaline Phos 38 - 126 U/L 69 90 91  AST 15 - 41 U/L 51(H) 68(H) 23  ALT 0 - 44 U/L 27 48(H) 30   Lipase: 102  Imaging studies: I personally evaluated the images EXAM: CT ABDOMEN AND PELVIS WITH CONTRAST  TECHNIQUE: Multidetector CT imaging of the abdomen and pelvis was performed using the standard protocol following  bolus administration of intravenous contrast.  CONTRAST:  75mL OMNIPAQUE IOHEXOL 300 MG/ML  SOLN  COMPARISON:  CT of the abdomen pelvis dated 12/31/2018.  FINDINGS: Lower chest: There are bibasilar linear atelectasis/scarring. There is mild cardiomegaly. Coronary vascular calcification primarily involving the RCA noted.  No intra-abdominal free air. No free fluid.  Hepatobiliary: Indeterminate 8 mm hypodense focus in the dome of the liver similar to prior CT. Additional subcentimeter hypodensity in the superior left lobe of the liver is not characterized, likely cyst. Focal area of fatty infiltration adjacent to the falciform ligament. The gallbladder is partially contracted and grossly unremarkable. No calcified gallstone or pericholecystic fluid.  Pancreas: There is extensive peripancreatic inflammatory changes with areas of fat necrosis and pseudocyst formation. Overall significant interval increase in the peripancreatic inflammatory changes and fat necrosis since the prior CT. No drainable fluid collection identified at this time.  Spleen: Normal in size without focal abnormality.  Adrenals/Urinary Tract: Mild left adrenal thickening or indeterminate left adrenal nodule similar to prior CT. The right adrenal gland is unremarkable. There is no hydronephrosis on either side. There is symmetric enhancement and excretion of contrast by both kidneys. Subcentimeter right renal hypodense focus is not characterized. The visualized ureters and urinary bladder appear unremarkable.  Stomach/Bowel: Thickened appearance of the inferior gastric wall and greater curvature of the stomach, reactive to inflammatory changes of the pancreas. There is no bowel obstruction. Loose stool throughout the colon consistent with diarrheal state. The appendix is normal.  Vascular/Lymphatic: Moderate aortoiliac atherosclerotic disease. The IVC is unremarkable. No portal venous gas. There  is no adenopathy.  Reproductive: The uterus is grossly unremarkable. There is a 15 mm left adnexal cystic structure which may represent an ovarian or paraovarian cyst.  Other: None  Musculoskeletal: Degenerative changes of the spine. No acute osseous pathology. Grade 1 L4-L5 anterolisthesis.  IMPRESSION: 1. Acute pancreatitis with interval increase in the peripancreatic inflammatory changes and fat necrosis since the prior CT. No drainable fluid collection identified at this time. 2. Diarrheal state. No bowel obstruction. Normal appendix. 3. Aortic Atherosclerosis (ICD10-I70.0).  Electronically Signed: By: Elgie CollardArash  Radparvar M.D. On: 01/16/2019 13:43   Assessment/Plan: 83 y.o. female with worsening acute pancreatitis, complicated by pertinent comorbidities including hypertension, hyperlipidemia, vertigo.  Patient presented with acute pancreatitis admitted 16 days ago.  During that admission patient  had a cholecystectomy due to gallstone pancreatitis.  Patient was discharged 10 days ago.  She tolerated the surgery well.  The day after surgery her lipase was normal, her bilirubin was normal and the pain was controlled.  She was recovering well at home until few days that she has been having more abdominal pain nausea or vomiting.  CT scan of the abdomen shows worsening peripancreatic fat necrosis and fluid.  At this point 16 days from initial symptoms of pancreatitis tissue not can be considered a pancreatic pseudocyst which is usually seen 4 weeks after pancreatitis.    With these findings patient currently with moderately severe necrotizing acute pancreatitis with significant peripancreatic fat necrosis and fluid collection.  With these findings and the combination of symptoms including weakness, nausea and vomiting I agree with admission for hydration and pain management.  Most of these acute necrotic collections can be controlled and resolved with conservative management.  Low  percentage of discoloration can involve to infected necrosis.  If the patient does not improve early in the course of conservative management treatment I would recommend early transfer to tertiary center with gastroenterology and hepatobiliary surgery services capable of doing endoscopic drainage versus percutaneous drainage versus aggressive surgical management of pancreatic complications.  I will continue to follow to assist in the management of this patient.  No surgical management indicated.  Gae Gallop, MD

## 2019-01-16 NOTE — ED Provider Notes (Signed)
[facility] Emergency Department Provider Note       Time seen: ----------------------------------------- 12:11 PM on 01/16/2019 -----------------------------------------   I have reviewed the triage vital signs and the nursing notes.  HISTORY   Chief Complaint Weakness and Post-op Problem    HPI Madeline Taylor is a 83 y.o. female with a history of hypertension, vertigo, hyperlipidemia who presents to the ED for generalized weakness.  Patient had her gallbladder removed on 19 December she reports increased nausea with vomiting for the past 24 hours.  Patient states she has not felt well after being discharged from the hospital.  Discomfort is 5 out of 10 in the abdomen.  Past Medical History:  Diagnosis Date  . Essential hypertension 07/29/2016  . History of vertigo 07/29/2016  . Hyperlipidemia 07/29/2016  . Osteoarthritis of back 07/29/2016  . Osteopenia of multiple sites 07/29/2016  . Seasonal allergies 07/29/2016  . Urinary, incontinence, stress female 07/29/2016    Patient Active Problem List   Diagnosis Date Noted  . Malnutrition of moderate degree 01/03/2019  . Pancreatitis 12/31/2018  . Gallstone 12/31/2018  . Multifocal pneumonia 12/31/2018  . Osteoarthritis of multiple joints 05/18/2018  . OAB (overactive bladder) 10/16/2016  . Hyperlipidemia 07/29/2016  . Essential hypertension 07/29/2016  . Osteoporosis of femur without pathological fracture 07/29/2016  . Seasonal allergies 07/29/2016  . History of vertigo 07/29/2016  . Urinary, incontinence, stress female 07/29/2016  . Osteoarthritis of back 07/29/2016    Past Surgical History:  Procedure Laterality Date  . no past surgery      Allergies Lisinopril and Statins  Social History Social History   Tobacco Use  . Smoking status: Never Smoker  . Smokeless tobacco: Never Used  Substance Use Topics  . Alcohol use: No  . Drug use: No   Review of Systems Constitutional: Negative for  fever. Cardiovascular: Negative for chest pain. Respiratory: Negative for shortness of breath. Gastrointestinal: Positive for abdominal pain, nausea and vomiting Musculoskeletal: Negative for back pain. Skin: Negative for rash. Neurological: Negative for headaches, focal weakness or numbness.  All systems negative/normal/unremarkable except as stated in the HPI  ____________________________________________   PHYSICAL EXAM:  VITAL SIGNS: ED Triage Vitals  Enc Vitals Group     BP --      Pulse --      Resp --      Temp --      Temp src --      SpO2 --      Weight 01/16/19 1210 93 lb (42.2 kg)     Height 01/16/19 1210 5' (1.524 m)     Head Circumference --      Peak Flow --      Pain Score 01/16/19 1204 5     Pain Loc --      Pain Edu? --      Excl. in GC? --     Constitutional: Alert and oriented.  Mild to moderate distress Eyes: Conjunctivae are normal. Normal extraocular movements. ENT      Head: Normocephalic and atraumatic.      Nose: No congestion/rhinnorhea.      Mouth/Throat: Mucous membranes are moist.      Neck: No stridor. Cardiovascular: Normal rate, regular rhythm. No murmurs, rubs, or gallops. Respiratory: Normal respiratory effort without tachypnea nor retractions. Breath sounds are clear and equal bilaterally. No wheezes/rales/rhonchi. Gastrointestinal: Soft with nonfocal tenderness.  Normal bowel sounds. Musculoskeletal: Nontender with normal range of motion in extremities. No lower extremity tenderness nor  edema. Neurologic:  Normal speech and language. No gross focal neurologic deficits are appreciated.  Skin:  Skin is warm, dry and intact. No rash noted. Psychiatric: Depressed mood and affect ____________________________________________  ED COURSE:  As part of my medical decision making, I reviewed the following data within the electronic MEDICAL RECORD NUMBER History obtained from family if available, nursing notes, old chart and ekg, as well as notes  from prior ED visits. Patient presented for weakness with nausea and vomiting, we will assess with labs and imaging as indicated at this time.   Procedures  CHEYANNA STRICK was evaluated in Emergency Department on 01/16/2019 for the symptoms described in the history of present illness. She was evaluated in the context of the global COVID-19 pandemic, which necessitated consideration that the patient might be at risk for infection with the SARS-CoV-2 virus that causes COVID-19. Institutional protocols and algorithms that pertain to the evaluation of patients at risk for COVID-19 are in a state of rapid change based on information released by regulatory bodies including the CDC and federal and state organizations. These policies and algorithms were followed during the patient's care in the ED.   EKG: Interpreted by me, sinus rhythm rate of 84 bpm, normal PR interval, normal QRS, normal QT ____________________________________________   LABS (pertinent positives/negatives)  Labs Reviewed  BASIC METABOLIC PANEL - Abnormal; Notable for the following components:      Result Value   Sodium 133 (*)    CO2 18 (*)    Calcium 8.1 (*)    All other components within normal limits  CBC - Abnormal; Notable for the following components:   RBC 3.73 (*)    Hemoglobin 11.0 (*)    HCT 34.1 (*)    All other components within normal limits  LIPASE, BLOOD - Abnormal; Notable for the following components:   Lipase 102 (*)    All other components within normal limits  HEPATIC FUNCTION PANEL - Abnormal; Notable for the following components:   Albumin 2.5 (*)    AST 51 (*)    Total Bilirubin 1.3 (*)    Indirect Bilirubin 1.1 (*)    All other components within normal limits  GLUCOSE, CAPILLARY  URINALYSIS, COMPLETE (UACMP) WITH MICROSCOPIC  CBG MONITORING, ED    RADIOLOGY Images were viewed by me  CT the abdomen pelvis with contrast  IMPRESSION:  1. Acute pancreatitis with interval increase in the  peripancreatic  inflammatory changes and fat necrosis since the prior CT. No  drainable fluid collection identified at this time.  2. Diarrheal state. No bowel obstruction. Normal appendix.  3. Aortic Atherosclerosis (ICD10-I70.0).  ____________________________________________   DIFFERENTIAL DIAGNOSIS   Postoperative complication, dehydration, electrolyte abnormality, postop infection, bowel obstruction  FINAL ASSESSMENT AND PLAN  Postoperative pain, vomiting, pancreatitis   Plan: The patient had presented for postoperative pain and vomiting. Patient's labs did indicate recurrent or worsening pancreatitis, unclear if this is related to recent surgery or not. Patient's imaging was consistent with same with acute pancreatitis and an interval increase in the peripancreatic inflammatory changes with fat necrosis.  Given her degree of discomfort, nausea and weakness I will discuss with the hospitalist for admission with reconsult to general surgery.   Ulice Dash, MD    Note: This note was generated in part or whole with voice recognition software. Voice recognition is usually quite accurate but there are transcription errors that can and very often do occur. I apologize for any typographical errors that were not detected  and corrected.     Earleen Newport, MD 01/16/19 1425

## 2019-01-17 ENCOUNTER — Encounter: Payer: Self-pay | Admitting: Internal Medicine

## 2019-01-17 DIAGNOSIS — U071 COVID-19: Secondary | ICD-10-CM

## 2019-01-17 LAB — COMPREHENSIVE METABOLIC PANEL
ALT: 23 U/L (ref 0–44)
AST: 41 U/L (ref 15–41)
Albumin: 2.1 g/dL — ABNORMAL LOW (ref 3.5–5.0)
Alkaline Phosphatase: 62 U/L (ref 38–126)
Anion gap: 12 (ref 5–15)
BUN: 8 mg/dL (ref 8–23)
CO2: 20 mmol/L — ABNORMAL LOW (ref 22–32)
Calcium: 7.5 mg/dL — ABNORMAL LOW (ref 8.9–10.3)
Chloride: 104 mmol/L (ref 98–111)
Creatinine, Ser: 0.55 mg/dL (ref 0.44–1.00)
GFR calc Af Amer: >60 mL/min (ref >60)
GFR calc non Af Amer: >60 mL/min (ref >60)
Glucose, Bld: 80 mg/dL (ref 70–99)
Potassium: 3.4 mmol/L — ABNORMAL LOW (ref 3.5–5.1)
Sodium: 136 mmol/L (ref 135–145)
Total Bilirubin: 1.3 mg/dL — ABNORMAL HIGH (ref 0.3–1.2)
Total Protein: 5.9 g/dL — ABNORMAL LOW (ref 6.5–8.1)

## 2019-01-17 LAB — CBC
HCT: 31.6 % — ABNORMAL LOW (ref 36.0–46.0)
Hemoglobin: 10 g/dL — ABNORMAL LOW (ref 12.0–15.0)
MCH: 29 pg (ref 26.0–34.0)
MCHC: 31.6 g/dL (ref 30.0–36.0)
MCV: 91.6 fL (ref 80.0–100.0)
Platelets: 321 10*3/uL (ref 150–400)
RBC: 3.45 MIL/uL — ABNORMAL LOW (ref 3.87–5.11)
RDW: 11.9 % (ref 11.5–15.5)
WBC: 8.1 10*3/uL (ref 4.0–10.5)
nRBC: 0 % (ref 0.0–0.2)

## 2019-01-17 LAB — URINALYSIS, COMPLETE (UACMP) WITH MICROSCOPIC
Bacteria, UA: NONE SEEN
Bilirubin Urine: NEGATIVE
Glucose, UA: NEGATIVE mg/dL
Ketones, ur: 20 mg/dL — AB
Leukocytes,Ua: NEGATIVE
Nitrite: NEGATIVE
Protein, ur: NEGATIVE mg/dL
Specific Gravity, Urine: 1.015 (ref 1.005–1.030)
pH: 6 (ref 5.0–8.0)

## 2019-01-17 LAB — SARS CORONAVIRUS 2 (TAT 6-24 HRS): SARS Coronavirus 2: POSITIVE — AB

## 2019-01-17 LAB — PROTIME-INR
INR: 1.3 — ABNORMAL HIGH (ref 0.8–1.2)
Prothrombin Time: 16 seconds — ABNORMAL HIGH (ref 11.4–15.2)

## 2019-01-17 LAB — LIPASE, BLOOD: Lipase: 85 U/L — ABNORMAL HIGH (ref 11–51)

## 2019-01-17 LAB — APTT: aPTT: 48 seconds — ABNORMAL HIGH (ref 24–36)

## 2019-01-17 MED ORDER — POTASSIUM CHLORIDE CRYS ER 20 MEQ PO TBCR
20.0000 meq | EXTENDED_RELEASE_TABLET | Freq: Once | ORAL | Status: AC
  Administered 2019-01-17: 20 meq via ORAL
  Filled 2019-01-17: qty 1

## 2019-01-17 MED ORDER — GUAIFENESIN 100 MG/5ML PO SOLN
5.0000 mL | ORAL | Status: DC | PRN
  Administered 2019-01-17: 100 mg via ORAL
  Filled 2019-01-17 (×3): qty 5

## 2019-01-17 NOTE — Progress Notes (Signed)
SURGICAL PROGRESS NOTE   Hospital Day(s): 1.   Post op day(s):  Marland Kitchen   Interval History: Patient seen and examined, no acute events or new complaints overnight. Patient reports having mild abdominal pain. Reports pain is 4/10. Denies nausea or vomiting today. Denies fever or chills.   Patient was found with positive COVID-19 test. Denies shortness of breath or coughing.    Vital signs in last 24 hours: [min-max] current  Temp:  [97.8 F (36.6 C)-98.8 F (37.1 C)] 98.2 F (36.8 C) (12/30 1252) Pulse Rate:  [61-69] 63 (12/30 1252) Resp:  [18] 18 (12/30 1252) BP: (117-128)/(50-58) 128/56 (12/30 1252) SpO2:  [96 %-99 %] 99 % (12/30 1252)     Height: 5' (152.4 cm) Weight: 42.2 kg BMI (Calculated): 18.16   Physical Exam:  Constitutional: alert, cooperative and no distress  Respiratory: breathing non-labored at rest  Cardiovascular: regular rate and sinus rhythm  Gastrointestinal: soft, non-tender, and non-distended  Labs:  CBC Latest Ref Rng & Units 01/17/2019 01/16/2019 01/06/2019  WBC 4.0 - 10.5 K/uL 8.1 8.3 12.4(H)  Hemoglobin 12.0 - 15.0 g/dL 10.0(L) 11.0(L) 10.3(L)  Hematocrit 36.0 - 46.0 % 31.6(L) 34.1(L) 31.1(L)  Platelets 150 - 400 K/uL 321 377 221   CMP Latest Ref Rng & Units 01/17/2019 01/16/2019 01/06/2019  Glucose 70 - 99 mg/dL 80 79 035(K)  BUN 8 - 23 mg/dL 8 12 10   Creatinine 0.44 - 1.00 mg/dL 0.93 8.18  Sodium 135 - 145 mmol/L 136 133(L) 138  Potassium 3.5 - 5.1 mmol/L 3.4(L) 3.7 3.8  Chloride 98 - 111 mmol/L 104 100 107  CO2 22 - 32 mmol/L 20(L) 18(L) 22  Calcium 8.9 - 10.3 mg/dL 7.5(L) 8.1(L) 7.4(L)  Total Protein 6.5 - 8.1 g/dL 5.9(L) 7.0 5.3(L)  Total Bilirubin 0.3 - 1.2 mg/dL 2.99) 1.3(H) 0.6  Alkaline Phos 38 - 126 U/L 62 69 90  AST 15 - 41 U/L 41 51(H) 68(H)  ALT 0 - 44 U/L 23 27 48(H)    Imaging studies: No new pertinent imaging studies   Assessment/Plan:  83 y.o. female with acute pancreatitis with peripancreatic fat necrosis and fluid  collection, complicated by pertinent comorbidities including hypertension, hyperlipidemia, vertigo.  Patient with initial episode of pancreatitis 17 days ago. At that moment the patient was found with cholelithiasis. During that admission upon resolution of pancreatitis with normal lipase and improvement of abdominal pain, cholecystectomy was done.   During the admission patient with initial COVID 19 test negative. Due to elevated D dimers, CTA of the chest done and found with bilateral pulmonary infiltrates. A second COVID-19 test was done and was also negative.   Patient was discharged with no abdominal pain, tolerating diet.   Patient returns with nausea, vomiting and abdominal pain. Pain has always been intermittent. New CT scan shows significant worsening of peri pancreatic inflammation with peri pancreatic fat necrosis and fluid collection.   Differential could be progression of initial pancreatitis episode to severe acute pancreatitis with necrotic collection. Other differential could be from other etiologies of pancreatitis.   I agree with current conservative management with bowel rest, IV fluid for hydration and pain management. If pain continue to improve, early enteral feeding is recommended. If patient does not improve clinically in the early course, transfer to a tertiary center will be recommended. If the necrotic collection progress patient will benefit of advanced procedure such as endoscopic drainage (endoscopic necrosectomies), vs percutanous drainage, vs management by Hepatobiliary surgeon as last resource if patient needs open  necrosectomy. The morbility and mortality of the latter procedure is very high.    There is no surgical management needed at this moment. Workup for other causes of pancreatitis should be considered.   Arnold Long, MD

## 2019-01-17 NOTE — Progress Notes (Signed)
Initial Nutrition Assessment  DOCUMENTATION CODES:   Underweight(Suspect PCM)  INTERVENTION:  -Advance diet as medically feasible and provide nutrition supplements as appropriate   NUTRITION DIAGNOSIS:   Inadequate oral intake related to altered GI function(moderatly severe necrotizing acute pancreatitis) as evidenced by percent weight loss(11.2 lb wt loss in 2 weeks).   GOAL:   Patient will meet greater than or equal to 90% of their needs   MONITOR:   I & O's, Labs, Weight trends, Diet advancement  REASON FOR ASSESSMENT:   Malnutrition Screening Tool    ASSESSMENT:  RD working remotely.  83 year old female with past known medical history of HTN, h/o vertigo, HLD, osteoarthritis of back, and osteopenia, recently discharged from [facility] on 12/19 for calcium pancreatitis s/p robotic assisted laparoscopic cholecystectomy who presented to ED with generalized weakness and increased nausea with vomiting for the past 24 hrs and reports not feeling well since being discharged from hospital. Patient admitted with postoperative pain, vomiting, and pancreatitis and found to be COVID-19 positive.  Patient evaluated by surgery, per notes pt currently with moderately severe necrotizing acute pancreatitis with significant peripancreatic fat necrosis and fluid collection. Collections controlled/resolved with conservative management; no surgical indications at this time.   Patient seen by inpatient RD during previous admission and was diagnosed with moderate malnutrition at that time. Patient reported poor po intake beginning 12/9 secondary to abdominal pain and nausea and endorsed poor intake during admission. Highly suspect ongoing malnutrition given severe wt loss over the past 2 weeks, current underweight status, and the mild/moderate fat and muscle depletions present on 12/15 NFPE noted below. RD attempted to contact patient via phone this afternoon to obtain recent nutrition history, pt did not  answer inpatient phone. Unfortunately, unable to diagnose malnutrition at this time without recent dietary recall and physical exam.   Patient is NPO at this time, RD will continue to monitor for diet advancement and provide nutrition supplements as appropriate.   UBW 112 lbs per 12/15 RD note Current wt 42.2 kg (92.84 lbs) Weight history reviewed, she has lost 11.22 lbs (10.8%) in 2 weeks, which is severe for time frame. On 12/15 pt wt 47.2 kg (104.06 lbs)  I/Os: +700 ml since admit UOP: 300 ml since admit  Medications and labs reviewed  NUTRITION - FOCUSED PHYSICAL EXAM: Unable to complete at this time, RD working remotely.  12/15 Findings: Mild fat depletions to thoracic and lumbar region; Moderate fat depletions to orbital, buccal, and upper arm region; Mild muscle depletions to patellar and anterior thigh region; Moderate muscle depletion to temple, clavicle, clavicle and acromion, dorsal hand, and posterior calf regions.   Diet Order:   Diet Order            Diet NPO time specified Except for: Sips with Meds  Diet effective now              EDUCATION NEEDS:   Not appropriate for education at this time  Skin:  Skin Assessment: Reviewed RN Assessment(incision closed;abdomen)  Last BM:  12/29  Height:   Ht Readings from Last 1 Encounters:  01/16/19 5' (1.524 m)    Weight:   Wt Readings from Last 1 Encounters:  01/16/19 42.2 kg    Ideal Body Weight:  44.7 kg  BMI:  Body mass index is 18.16 kg/m.  Estimated Nutritional Needs:   Kcal:  1400-1600  Protein:  70-80  Fluid:  >/= 1.4 L/day   Lajuan Lines, RD, LDN Clinical Nutrition Office [phone]  After Hours/Weekend Pager: [phone]

## 2019-01-17 NOTE — ED Notes (Signed)
[facility] lab called with positive Covid test, Jeanette Caprice RN made aware.

## 2019-01-18 DIAGNOSIS — R531 Weakness: Secondary | ICD-10-CM

## 2019-01-18 DIAGNOSIS — U071 COVID-19: Secondary | ICD-10-CM

## 2019-01-18 MED ORDER — HYDROCODONE-ACETAMINOPHEN 5-325 MG PO TABS: 1.0000 | ORAL_TABLET | Freq: Three times a day (TID) | ORAL | 0 refills | Status: DC | PRN

## 2019-01-18 MED ORDER — METOPROLOL TARTRATE 25 MG PO TABS: 25.0000 mg | ORAL_TABLET | Freq: Two times a day (BID) | ORAL | 0 refills | Status: DC

## 2019-01-18 MED ORDER — ASPIRIN EC 81 MG PO TBEC
81.0000 mg | DELAYED_RELEASE_TABLET | Freq: Every day | ORAL | Status: DC
  Administered 2019-01-18: 11:00:00 81 mg via ORAL
  Filled 2019-01-18: qty 1

## 2019-01-18 MED ORDER — GUAIFENESIN 100 MG/5ML PO SOLN: 5.0000 mL | ORAL | 0 refills | Status: DC | PRN

## 2019-01-18 NOTE — Discharge Summary (Signed)
[facility] [place] at [facility]   PATIENT NAME: Madeline Taylor    MR#:  [id number]  DATE OF BIRTH:  12-25-1935  DATE OF ADMISSION:  01/16/2019 ADMITTING PHYSICIAN: Delfino Lovett, MD  DATE OF DISCHARGE: 12.31/2020  PRIMARY CARE PHYSICIAN: Galen Manila, NP (Inactive)    ADMISSION DIAGNOSIS:  Acute pancreatitis [K85.90] Weakness [R53.1] Idiopathic acute pancreatitis without infection or necrosis [K85.00]  DISCHARGE DIAGNOSIS:  acute pancreatitis COVID-19 positive  SECONDARY DIAGNOSIS:   Past Medical History:  Diagnosis Date  . Essential hypertension 07/29/2016  . History of vertigo 07/29/2016  . Hyperlipidemia 07/29/2016  . Osteoarthritis of back 07/29/2016  . Osteopenia of multiple sites 07/29/2016  . Seasonal allergies 07/29/2016  . Urinary, incontinence, stress female 07/29/2016    HOSPITAL COURSE:   [name] a83 y.o.femalewith a known history of hypertension, vertigo, hyperlipidemiabeing admitted for acute pancreatitis  *Acute pancreatitiswith h/orecent cholecystectomy on 12/18 -patient tolerating clear liquid well. Denies any abdominal pain. R -Pt feeling better. No abdominal pain.  -lipase trending down 85 -seen by Dr Hazle Quant -no fever vomiting -recommend to continue clear liquid/full liquid for 2 to 3 days and continue hydration at home then advance to soft as tolerated.  2.Hyponatremia Likely due to dehydration.  received IV hydration -mild 133-->136  3. HTN On metoprolol  4. Moderate protein calorie malnutrition Ensure drinks  5. COVID positive -no cp or sob sats good, mild cough patient advised to self currently not home.  I discussed at length discharge plan with Mr. Ned patient's husband on the phone. Patient ambulates by herself at home. She did agreement with plan and wants to go home.   Procedures:none Family communication :pt and husband on the phone Consults :surgery Discharge Disposition  :home CODE STATUS: full DVT Prophylaxis :heparin  CONSULTS OBTAINED:  Treatment Team:  Carolan Shiver, MD  DRUG ALLERGIES:   Allergies  Allergen Reactions  . Lisinopril Cough  . Statins     DISCHARGE MEDICATIONS:   Allergies as of 01/18/2019      Reactions   Lisinopril Cough   Statins       Medication List    STOP taking these medications   irbesartan 300 MG tablet Commonly known as: AVAPRO   methocarbamol 500 MG tablet Commonly known as: ROBAXIN   polyethylene glycol 17 g packet Commonly known as: MIRALAX / GLYCOLAX     TAKE these medications   aspirin 81 MG tablet Take 81 mg by mouth daily.   guaiFENesin 100 MG/5ML Soln Commonly known as: ROBITUSSIN Take 5 mLs (100 mg total) by mouth every 4 (four) hours as needed for cough or to loosen phlegm.   HYDROcodone-acetaminophen 5-325 MG tablet Commonly known as: NORCO/VICODIN Take 1 tablet by mouth every 8 (eight) hours as needed for moderate pain.   metoprolol tartrate 25 MG tablet Commonly known as: LOPRESSOR Take 1 tablet (25 mg total) by mouth 2 (two) times daily.       If you experience worsening of your admission symptoms, develop shortness of breath, life threatening emergency, suicidal or homicidal thoughts you must seek medical attention immediately by calling 911 or calling your MD immediately  if symptoms less severe.  You Must read complete instructions/literature along with all the possible adverse reactions/side effects for all the Medicines you take and that have been prescribed to you. Take any new Medicines after you have completely understood and accept all the possible adverse reactions/side effects.   Please note  You were cared for by a  hospitalist during your hospital stay. If you have any questions about your discharge medications or the care you received while you were in the hospital after you are discharged, you can call the unit and asked to speak with the hospitalist on  call if the hospitalist that took care of you is not available. Once you are discharged, your primary care physician will handle any further medical issues. Please note that NO REFILLS for any discharge medications will be authorized once you are discharged, as it is imperative that you return to your primary care physician (or establish a relationship with a primary care physician if you do not have one) for your aftercare needs so that they can reassess your need for medications and monitor your lab values. Today   SUBJECTIVE  denies any abdominal pain. Tolerating clear liquid. No vomiting no fever. Mild cough. No shortness of breath or respiratory distress   VITAL SIGNS:  Blood pressure (!) 158/59, pulse 73, temperature 99.1 F (37.3 C), temperature source Oral, resp. rate 18, height 5' (1.524 m), weight 42.2 kg, SpO2 100 %.  I/O:    Intake/Output Summary (Last 24 hours) at 01/18/2019 1422 Last data filed at 01/18/2019 0700 Gross per 24 hour  Intake 1200 ml  Output 350 ml  Net 850 ml    PHYSICAL EXAMINATION:  GENERAL:  83 y.o.-year-old patient lying in the bed with no acute distress.  EYES: Pupils equal, round, reactive to light and accommodation. No scleral icterus. Extraocular muscles intact.  HEENT: Head atraumatic, normocephalic. Oropharynx and nasopharynx clear.  NECK:  Supple, no jugular venous distention. No thyroid enlargement, no tenderness.  LUNGS: Normal breath sounds bilaterally, no wheezing, rales,rhonchi or crepitation. No use of accessory muscles of respiration.  CARDIOVASCULAR: S1, S2 normal. No murmurs, rubs, or gallops.  ABDOMEN: Soft, non-tender, non-distended. Bowel sounds present. No organomegaly or mass.  EXTREMITIES: No pedal edema, cyanosis, or clubbing.  NEUROLOGIC: Cranial nerves II through XII are intact. Muscle strength 5/5 in all extremities. Sensation intact. Gait not checked.  PSYCHIATRIC: The patient is alert and oriented x 3.  SKIN: No obvious  rash, lesion, or ulcer.   DATA REVIEW:   CBC  Recent Labs  Lab 01/17/19 0437  WBC 8.1  HGB 10.0*  HCT 31.6*  PLT 321    Chemistries  Recent Labs  Lab 01/17/19 0437  NA 136  K 3.4*  CL 104  CO2 20*  GLUCOSE 80  BUN 8  CREATININE 0.55  CALCIUM 7.5*  AST 41  ALT 23  ALKPHOS 62  BILITOT 1.3*    Microbiology Results   Recent Results (from the past 240 hour(s))  SARS CORONAVIRUS 2 (TAT 6-24 HRS) Nasopharyngeal Nasopharyngeal Swab     Status: Abnormal   Collection Time: 01/16/19  3:10 PM   Specimen: Nasopharyngeal Swab  Result Value Ref Range Status   SARS Coronavirus 2 POSITIVE (A) NEGATIVE Final    Comment: RESULT CALLED TO, READ BACK BY AND VERIFIED WITH: RN RAQUEL DAVID AT [place] ON 01/17/2019 (NOTE) SARS-CoV-2 target nucleic acids are DETECTED. The SARS-CoV-2 RNA is generally detectable in upper and lower respiratory specimens during the acute phase of infection. Positive results are indicative of the presence of SARS-CoV-2 RNA. Clinical correlation with patient history and other diagnostic information is  necessary to determine patient infection status. Positive results do not rule out bacterial infection or co-infection with other viruses.  The expected result is Negative. Fact Sheet for Patients: SugarRoll.be Fact Sheet for Healthcare  Providers: quierodirigir.comhttps://www.fda.gov/media/138095/download This test is not yet approved or cleared by the [place] FDA and  has been authorized for detection and/or diagnosis of SARS-CoV-2 by FDA under an Emergency Use Authorization (EUA). This EUA will remain  in effect (meaning this te st can be used) for the duration of the COVID-19 declaration under Section 564(b)(1) of the Act, 21 U.S.C. section 360bbb-3(b)(1), unless the authorization is terminated or revoked sooner. Performed at [facility], [address] [place], [place] [address]     RADIOLOGY:  No  results found.   CODE STATUS:     Code Status Orders  (From admission, onward)         Start     Ordered   01/16/19 1541  Full code  Continuous     01/16/19 1540        Code Status History    Date Active Date Inactive Code Status Order ID Comments User Context   12/31/2018 1937 01/06/2019 1721 Full Code [id number]  Jacques NavyNorins, Michael E, MD ED   Advance Care Planning Activity       TOTAL TIME TAKING CARE OF THIS PATIENT: 40 minutes.    Enedina FinnerSona Myli Pae M.D on 01/18/2019 at 2:22 PM  Between 7am to 6pm - Pager - [phone] After 6pm go to [email or web address] - password [email or web address]  [facility]    CC: Primary care physician; Galen ManilaKennedy, Lauren Renee, NP (Inactive)

## 2019-01-18 NOTE — Discharge Instructions (Signed)
Patient advised to self quarantine in the house. Clear liquid/full liquid diet for 2 to 3 days--- advance to soft as tolerated

## 2019-01-18 NOTE — Plan of Care (Signed)
Education completed upon the patient's discharge. IV removed. Covid virus education performed. On a clear liquid diet.  Problem: Education: Goal: Knowledge of General Education information will improve Description: Including pain rating scale, medication(s)/side effects and non-pharmacologic comfort measures Outcome: Completed/Met   Problem: Health Behavior/Discharge Planning: Goal: Ability to manage health-related needs will improve Outcome: Completed/Met   Problem: Clinical Measurements: Goal: Ability to maintain clinical measurements within normal limits will improve Outcome: Completed/Met Goal: Will remain free from infection Outcome: Completed/Met Goal: Diagnostic test results will improve Outcome: Completed/Met Goal: Respiratory complications will improve Outcome: Completed/Met Goal: Cardiovascular complication will be avoided Outcome: Completed/Met   Problem: Nutrition: Goal: Adequate nutrition will be maintained Outcome: Completed/Met   Problem: Coping: Goal: Level of anxiety will decrease Outcome: Completed/Met   Problem: Elimination: Goal: Will not experience complications related to bowel motility Outcome: Completed/Met Goal: Will not experience complications related to urinary retention Outcome: Completed/Met   Problem: Pain Managment: Goal: General experience of comfort will improve Outcome: Completed/Met   Problem: Safety: Goal: Ability to remain free from injury will improve Outcome: Completed/Met   Problem: Skin Integrity: Goal: Risk for impaired skin integrity will decrease Outcome: Completed/Met   Problem: Inadequate Intake (NI-2.1) Goal: Food and/or nutrient delivery Description: Individualized approach for food/nutrient provision. Outcome: Completed/Met

## 2019-01-18 NOTE — Progress Notes (Addendum)
[facility]  - [place] at [facility]   PATIENT NAME: Madeline Taylor    MR#:  [id number]  DATE OF BIRTH:  1935-12-11  SUBJECTIVE:  Came in with abdominal pain-epigastric area. Pt is post Cholecystectomy 10 days ago  Denies any abd pain today. COVID+. No cp or sob Mild cough  REVIEW OF SYSTEMS:   Review of Systems  Constitutional: Negative for chills, fever and weight loss.  HENT: Negative for ear discharge, ear pain and nosebleeds.   Eyes: Negative for blurred vision, pain and discharge.  Respiratory: Positive for cough. Negative for sputum production, shortness of breath, wheezing and stridor.   Cardiovascular: Negative for chest pain, palpitations, orthopnea and PND.  Gastrointestinal: Negative for abdominal pain, diarrhea, nausea and vomiting.  Genitourinary: Negative for frequency and urgency.  Musculoskeletal: Negative for back pain and joint pain.  Neurological: Positive for weakness. Negative for sensory change, speech change and focal weakness.  Psychiatric/Behavioral: Negative for depression and hallucinations. The patient is not nervous/anxious.    Tolerating Diet:npo Tolerating PT:   DRUG ALLERGIES:   Allergies  Allergen Reactions  . Lisinopril Cough  . Statins     VITALS:  Blood pressure (!) 145/68, pulse 73, temperature 98.3 F (36.8 C), temperature source Oral, resp. rate 20, height 5' (1.524 m), weight 42.2 kg, SpO2 96 %.  PHYSICAL EXAMINATION:   Physical Exam  GENERAL:  83 y.o.-year-old patient lying in the bed with no acute distress.  EYES: Pupils equal, round, reactive to light and accommodation. No scleral icterus. Extraocular muscles intact.  HEENT: Head atraumatic, normocephalic. Oropharynx and nasopharynx clear.  NECK:  Supple, no jugular venous distention. No thyroid enlargement, no tenderness.  LUNGS: Normal breath sounds bilaterally, no wheezing, rales, rhonchi. No use of accessory muscles of respiration.  CARDIOVASCULAR: S1, S2  normal. No murmurs, rubs, or gallops.  ABDOMEN: Soft, nontender, nondistended. Bowel sounds present. No organomegaly or mass.  EXTREMITIES: No cyanosis, clubbing or edema b/l.    NEUROLOGIC: Cranial nerves II through XII are intact. No focal Motor or sensory deficits b/l.   PSYCHIATRIC:  patient is alert and oriented x 3.  SKIN: No obvious rash, lesion, or ulcer.   LABORATORY PANEL:  CBC Recent Labs  Lab 01/17/19 0437  WBC 8.1  HGB 10.0*  HCT 31.6*  PLT 321    Chemistries  Recent Labs  Lab 01/17/19 0437  NA 136  K 3.4*  CL 104  CO2 20*  GLUCOSE 80  BUN 8  CREATININE 0.55  CALCIUM 7.5*  AST 41  ALT 23  ALKPHOS 62  BILITOT 1.3*   Cardiac Enzymes No results for input(s): TROPONINI in the last 168 hours. RADIOLOGY:  CT ABDOMEN PELVIS W CONTRAST  Addendum Date: 01/16/2019   ADDENDUM REPORT: 01/16/2019 14:42 ADDENDUM: Please note the gallbladder is surgically absent. Minimal low attenuating density along the surgical bed, postoperative. No drainable fluid collection. Electronically Signed   By: Elgie Collard M.D.   On: 01/16/2019 14:42   Result Date: 01/16/2019 CLINICAL DATA:  83 year old female with nausea and vomiting. History of pancreatitis. EXAM: CT ABDOMEN AND PELVIS WITH CONTRAST TECHNIQUE: Multidetector CT imaging of the abdomen and pelvis was performed using the standard protocol following bolus administration of intravenous contrast. CONTRAST:  58mL OMNIPAQUE IOHEXOL 300 MG/ML  SOLN COMPARISON:  CT of the abdomen pelvis dated 12/31/2018. FINDINGS: Lower chest: There are bibasilar linear atelectasis/scarring. There is mild cardiomegaly. Coronary vascular calcification primarily involving the RCA noted. No intra-abdominal free air. No free fluid.  Hepatobiliary: Indeterminate 8 mm hypodense focus in the dome of the liver similar to prior CT. Additional subcentimeter hypodensity in the superior left lobe of the liver is not characterized, likely cyst. Focal area of  fatty infiltration adjacent to the falciform ligament. The gallbladder is partially contracted and grossly unremarkable. No calcified gallstone or pericholecystic fluid. Pancreas: There is extensive peripancreatic inflammatory changes with areas of fat necrosis and pseudocyst formation. Overall significant interval increase in the peripancreatic inflammatory changes and fat necrosis since the prior CT. No drainable fluid collection identified at this time. Spleen: Normal in size without focal abnormality. Adrenals/Urinary Tract: Mild left adrenal thickening or indeterminate left adrenal nodule similar to prior CT. The right adrenal gland is unremarkable. There is no hydronephrosis on either side. There is symmetric enhancement and excretion of contrast by both kidneys. Subcentimeter right renal hypodense focus is not characterized. The visualized ureters and urinary bladder appear unremarkable. Stomach/Bowel: Thickened appearance of the inferior gastric wall and greater curvature of the stomach, reactive to inflammatory changes of the pancreas. There is no bowel obstruction. Loose stool throughout the colon consistent with diarrheal state. The appendix is normal. Vascular/Lymphatic: Moderate aortoiliac atherosclerotic disease. The IVC is unremarkable. No portal venous gas. There is no adenopathy. Reproductive: The uterus is grossly unremarkable. There is a 15 mm left adnexal cystic structure which may represent an ovarian or paraovarian cyst. Other: None Musculoskeletal: Degenerative changes of the spine. No acute osseous pathology. Grade 1 L4-L5 anterolisthesis. IMPRESSION: 1. Acute pancreatitis with interval increase in the peripancreatic inflammatory changes and fat necrosis since the prior CT. No drainable fluid collection identified at this time. 2. Diarrheal state. No bowel obstruction. Normal appendix. 3. Aortic Atherosclerosis (ICD10-I70.0). Electronically Signed: By: Anner Crete M.D. On: 01/16/2019  13:43   ASSESSMENT AND PLAN:  Madeline Taylor  is a 83 y.o. female with a known history of hypertension, vertigo, hyperlipidemia being admitted for acute pancreatitis  *Acute pancreatitis -recent cholecystectomy on 12/18 N.p.o. except meds and sips of water Pt feeling better. No abdominal pain.  -lipase trending down -seen by Dr Windell Moment  2. Hyponatremia Likely due to dehydration.  Start IV hydration -mild 133--136  3. HTN  On metoprolol  4. Moderate protein calorie malnutrition Ensure drinks  5. COVID positive -no cp or sob sats good, mild cough     Procedures:none Family communication :pt Consults :surgery Discharge Disposition :home CODE STATUS: full DVT Prophylaxis :heparin  TOTAL TIME TAKING CARE OF THIS PATIENT: *25* minutes.  >50% time spent on counselling and coordination of care  POSSIBLE D/C IN *1-3* DAYS, DEPENDING ON CLINICAL CONDITION.  Note: This dictation was prepared with Dragon dictation along with smaller phrase technology. Any transcriptional errors that result from this process are unintentional.  Fritzi Mandes M.D on 01/18/2019 at 7:32 AM  Between 7am to 6pm - Pager - [phone]  After 6pm go to [email or web address]  [facility]   CC: Primary care physician; Mikey College, NP (Inactive)Patient ID: Madeline Taylor, female   DOB: May 17, 1935, 83 y.o.   MRN: [id number]

## 2019-01-22 ENCOUNTER — Telehealth: Payer: Self-pay

## 2019-01-22 NOTE — Telephone Encounter (Signed)
Transition Care Management Follow-up Telephone Call  Date of discharge and from where: [facility] 12/31  How have you been since you were released from the hospital? "shes doing a little better, she ate something today if that says anything"  Any questions or concerns? No   Items Reviewed:  Did the pt receive and understand the discharge instructions provided? Yes   Medications obtained and verified? Yes   Any new allergies since your discharge? No   Dietary orders reviewed? Yes  Do you have support at home? Yes   Functional Questionnaire: (I = Independent and D = Dependent) ADLs: i  Bathing/Dressing- i  Meal Prep- i  Eating- i  Maintaining continence- i  Transferring/Ambulation- i  Managing Meds- i  Follow up appointments reviewed:   PCP Hospital f/u appt confirmed? Yes  Scheduled to see Dr.Karamalegos on 11/24/2019 @ 11am.  Specialist [facility] f/u appt confirmed? no  Are transportation arrangements needed? No   If their condition worsens, is the pt aware to call PCP or go to the Emergency Dept.? Yes  Was the patient provided with contact information for the PCP's office or ED? Yes  Was to pt encouraged to call back with questions or concerns? Yes

## 2019-01-23 ENCOUNTER — Telehealth: Payer: Self-pay

## 2019-01-23 NOTE — Telephone Encounter (Signed)
The pt husband was notified. He verbalize understanding, no questions or concern.

## 2019-01-23 NOTE — Telephone Encounter (Signed)
I  contact the patient to f/u on her recent telephone call placed to After hours line by her husband Mr. Sova stating she has coronavirus and isn't eating well.  I spoke with Mr. Navarrette and he said that she last ate something yesterday afternoon around 4pm. He said she is drinking and that she keeps a bottle of water beside her bed. She is able to get up to go to the restroom on her own. No fever, temp 98.2. I recommended him in the meantime to try to continue to force plenty of fluids to prevent dehydration.    After hours nurse recommended clear fluids in small amounts until the nausea Is resolved for 8 hours. Solids: gradually return to a mormal diet. Start with saltine crackers, white bread, rice, mashed potatoes, cereal apple sauce etc. If nausea last over a week or symptoms worsen to seek care.

## 2019-01-23 NOTE — Telephone Encounter (Signed)
Agree with advice, goal to keep up and maintain hydration and oral meal intake as tolerated. Can try meal supplement shake such as Ensure / Boost if need.  If she would like virtual visit to discuss symptoms and consider rx management such as nausea medicine, they may schedule that as well.  Saralyn Pilar, DO [facility] 01/23/2019, 12:50 PM

## 2019-01-24 ENCOUNTER — Encounter: Payer: Self-pay | Admitting: Family Medicine

## 2019-01-24 ENCOUNTER — Other Ambulatory Visit: Payer: Self-pay

## 2019-01-24 ENCOUNTER — Ambulatory Visit (INDEPENDENT_AMBULATORY_CARE_PROVIDER_SITE_OTHER): Payer: Medicare Other | Admitting: Family Medicine

## 2019-01-24 VITALS — Temp 98.2°F

## 2019-01-24 DIAGNOSIS — R630 Anorexia: Secondary | ICD-10-CM | POA: Diagnosis not present

## 2019-01-24 DIAGNOSIS — U071 COVID-19: Secondary | ICD-10-CM | POA: Diagnosis not present

## 2019-01-24 DIAGNOSIS — R110 Nausea: Secondary | ICD-10-CM

## 2019-01-24 DIAGNOSIS — K859 Acute pancreatitis without necrosis or infection, unspecified: Secondary | ICD-10-CM

## 2019-01-24 MED ORDER — ONDANSETRON 4 MG PO TBDP: 4.0000 mg | ORAL_TABLET | Freq: Three times a day (TID) | ORAL | 0 refills | Status: DC | PRN

## 2019-01-24 NOTE — Progress Notes (Signed)
Subjective:    Patient ID: Madeline Taylor, female    DOB: 10/07/1935, 84 y.o.   MRN: [id number]  Madeline Taylor is a 84 y.o. female presenting on 01/24/2019 for Hospitalization Follow-up (diagnose with COVID 19 x 8 days ago. pt husband concern because she is not eating, the last time she ate was about yesterday afternoon around 4-5pm. She ate about 3 spoon fulls of chicken noodle soup and 1/2 cracker . She seems to be confused, coughing, and nauseated. She is getting up to go to ther rest room on her on. )  Virtual / Telehealth Encounter - Telephone  The purpose of this virtual visit is to provide medical care while limiting exposure to the novel coronavirus (COVID19) for both patient and office staff.  Consent was obtained for remote visit:  Yes.   Answered questions that patient had about telehealth interaction:  Yes.   I discussed the limitations, risks, security and privacy concerns of performing an evaluation and management service by video/telephone. I also discussed with the patient that there may be a patient responsible charge related to this service. The patient expressed understanding and agreed to proceed.  Patient Location: Home Provider Location: [facility])  History primarily provided by patient's spouse Leory Plowman.   HPI  HOSPITAL FOLLOW-UP VISIT  Hospital/Location: [facility] Date of Admission: 01/16/19 Date of Discharge: 01/18/19 Transitions of care telephone call: Completed 01/22/19 by Marin Roberts LPN.  Reason for Admission: Abdominal pain pancreatitis, weakness Primary (+Secondary) Diagnosis: Acute Pancreatitis with recent cholecystectomy on 01/05/19, COVID19 positive, Dehydration  - Hospital H&P and Discharge Summary have been reviewed - Patient presents today 6 days after recent hospitalization. Brief summary of recent course, patient had symptoms of abdominal pain, pancreatitis developed after cholecystectomy earlier in December 12/18, and then  diagnosed covid positive, hospitalized, treated with IVF rehydration, pain control, and ultimately discharged on full/clear liquid diet. - Weight down from 110 lbs to 90 lbs approx by his report due to poor appetite.  - Today reports overall has not done very well after discharge. Symptoms of abdominal pain has improved. Not taking pain medicine anymore. Taking OTC [facility] nausea medicine as needed but not often. Drinking some liquids and small amount of soup, not tolerating Ensure due to taste too sweet, reduced activity. - They have not returned to surgeon for post op yet. Dr Hazle Quant [facility].  - New medications on discharge: Hydrocodone-Acetaminophen on discharge, no longer taking.  Denies fever, cough chill sweats, shortness of breath.  I have reviewed the discharge medication list, and have reconciled the current and discharge medications today.   Current Outpatient Medications:  .  aspirin 81 MG tablet, Take 81 mg by mouth daily., Disp: , Rfl:  .  metoprolol tartrate (LOPRESSOR) 25 MG tablet, Take 1 tablet (25 mg total) by mouth 2 (two) times daily. (Patient not taking: Reported on 01/24/2019), Disp: 60 tablet, Rfl: 0 .  ondansetron (ZOFRAN ODT) 4 MG disintegrating tablet, Take 1 tablet (4 mg total) by mouth every 8 (eight) hours as needed for nausea or vomiting., Disp: 30 tablet, Rfl: 0  ------------------------------------------------------------------------- Social History   Tobacco Use  . Smoking status: Never Smoker  . Smokeless tobacco: Never Used  Substance Use Topics  . Alcohol use: No  . Drug use: No    Review of Systems Per HPI unless specifically indicated above     Objective:    Temp 98.2 F (36.8 C) (Oral)   Wt Readings from Last 3  Encounters:  01/16/19 93 lb (42.2 kg)  12/31/18 104 lb 0.9 oz (47.2 kg)  12/29/18 104 lb (47.2 kg)    Physical Exam   Virtual visit today on telephone not physical exam.   Results for orders placed or performed  during the hospital encounter of 01/16/19  SARS CORONAVIRUS 2 (TAT 6-24 HRS) Nasopharyngeal Nasopharyngeal Swab   Specimen: Nasopharyngeal Swab  Result Value Ref Range   SARS Coronavirus 2 POSITIVE (A) NEGATIVE  Basic metabolic panel  Result Value Ref Range   Sodium 133 (L) 135 - 145 mmol/L   Potassium 3.7 3.5 - 5.1 mmol/L   Chloride 100 98 - 111 mmol/L   CO2 18 (L) 22 - 32 mmol/L   Glucose, Bld 79 70 - 99 mg/dL   BUN 12 8 - 23 mg/dL   Creatinine, Ser 0.62 0.44 - 1.00 mg/dL   Calcium 8.1 (L) 8.9 - 10.3 mg/dL   GFR calc non Af Amer >60 >60 mL/min   GFR calc Af Amer >60 >60 mL/min   Anion gap 15 5 - 15  CBC  Result Value Ref Range   WBC 8.3 4.0 - 10.5 K/uL   RBC 3.73 (L) 3.87 - 5.11 MIL/uL   Hemoglobin 11.0 (L) 12.0 - 15.0 g/dL   HCT 69.4 (L) 85.4 - 62.7 %   MCV 91.4 80.0 - 100.0 fL   MCH 29.5 26.0 - 34.0 pg   MCHC 32.3 30.0 - 36.0 g/dL   RDW 03.5 00.9 - 38.1 %   Platelets 377 150 - 400 K/uL   nRBC 0.0 0.0 - 0.2 %  Urinalysis, Complete w Microscopic  Result Value Ref Range   Color, Urine YELLOW (A) YELLOW   APPearance CLEAR (A) CLEAR   Specific Gravity, Urine 1.015 1.005 - 1.030   pH 6.0 5.0 - 8.0   Glucose, UA NEGATIVE NEGATIVE mg/dL   Hgb urine dipstick SMALL (A) NEGATIVE   Bilirubin Urine NEGATIVE NEGATIVE   Ketones, ur 20 (A) NEGATIVE mg/dL   Protein, ur NEGATIVE NEGATIVE mg/dL   Nitrite NEGATIVE NEGATIVE   Leukocytes,Ua NEGATIVE NEGATIVE   RBC / HPF 0-5 0 - 5 RBC/hpf   WBC, UA 0-5 0 - 5 WBC/hpf   Bacteria, UA NONE SEEN NONE SEEN   Squamous Epithelial / LPF 0-5 0 - 5   Mucus PRESENT   Lipase, blood  Result Value Ref Range   Lipase 102 (H) 11 - 51 U/L  Hepatic function panel  Result Value Ref Range   Total Protein 7.0 6.5 - 8.1 g/dL   Albumin 2.5 (L) 3.5 - 5.0 g/dL   AST 51 (H) 15 - 41 U/L   ALT 27 0 - 44 U/L   Alkaline Phosphatase 69 38 - 126 U/L   Total Bilirubin 1.3 (H) 0.3 - 1.2 mg/dL   Bilirubin, Direct 0.2 0.0 - 0.2 mg/dL   Indirect Bilirubin 1.1 (H)  0.3 - 0.9 mg/dL  Glucose, capillary  Result Value Ref Range   Glucose-Capillary 70 70 - 99 mg/dL  CBC  Result Value Ref Range   WBC 8.1 4.0 - 10.5 K/uL   RBC 3.45 (L) 3.87 - 5.11 MIL/uL   Hemoglobin 10.0 (L) 12.0 - 15.0 g/dL   HCT 82.9 (L) 93.7 - 16.9 %   MCV 91.6 80.0 - 100.0 fL   MCH 29.0 26.0 - 34.0 pg   MCHC 31.6 30.0 - 36.0 g/dL   RDW 67.8 93.8 - 10.1 %   Platelets 321 150 - 400 K/uL  nRBC 0.0 0.0 - 0.2 %  Comprehensive metabolic panel  Result Value Ref Range   Sodium 136 135 - 145 mmol/L   Potassium 3.4 (L) 3.5 - 5.1 mmol/L   Chloride 104 98 - 111 mmol/L   CO2 20 (L) 22 - 32 mmol/L   Glucose, Bld 80 70 - 99 mg/dL   BUN 8 8 - 23 mg/dL   Creatinine, Ser 0.55 0.44 - 1.00 mg/dL   Calcium 7.5 (L) 8.9 - 10.3 mg/dL   Total Protein 5.9 (L) 6.5 - 8.1 g/dL   Albumin 2.1 (L) 3.5 - 5.0 g/dL   AST 41 15 - 41 U/L   ALT 23 0 - 44 U/L   Alkaline Phosphatase 62 38 - 126 U/L   Total Bilirubin 1.3 (H) 0.3 - 1.2 mg/dL   GFR calc non Af Amer >60 >60 mL/min   GFR calc Af Amer >60 >60 mL/min   Anion gap 12 5 - 15  Protime-INR  Result Value Ref Range   Prothrombin Time 16.0 (H) 11.4 - 15.2 seconds   INR 1.3 (H) 0.8 - 1.2  APTT  Result Value Ref Range   aPTT 48 (H) 24 - 36 seconds  Lipase, blood  Result Value Ref Range   Lipase 85 (H) 11 - 51 U/L      Assessment & Plan:   Problem List Items Addressed This Visit    Acute pancreatitis - Primary    Other Visit Diagnoses    Nausea       Relevant Medications   ondansetron (ZOFRAN ODT) 4 MG disintegrating tablet   Poor appetite       COVID-19         Clinically with resolving acute pancreatitis, now s/p hospitalization, trigger following cholecystectomy robotic 01/05/19 presumed likely cause. - Recent COVID19 positive in hospitalization, on quarantine at home  Primary concern now is poor appetite, weakness, weight loss - some nausea without pain and some change of taste/smell affecting her.  Several recommendations made today  on diet strategy improving small portions / amounts of liquid, meal supplement, goal to improve nutrition  Rx sent Zofran ODT trial for improving nausea / appetite  Strict return precautions if not taking in appropriate PO intake or notice signs of dehydration, no urine output >12 hours, may go to hospital notify them about COVID etc and be evaluated may need more immediate rehydration IVF therapy or other testing at that time.  Follow back up with Gen Surgery Dr Windell Moment [facility] - phone number given to patient's spouse with their contact info for post op   Meds ordered this encounter  Medications  . ondansetron (ZOFRAN ODT) 4 MG disintegrating tablet    Sig: Take 1 tablet (4 mg total) by mouth every 8 (eight) hours as needed for nausea or vomiting.    Dispense:  30 tablet    Refill:  0    Follow up plan: Return in about 1 week (around 01/31/2019), or if symptoms worsen or fail to improve, for appetite, pancreatitis, nausea.  LOS [id number] = within 7 days of discharge  Nobie Putnam, [facility] 01/24/2019, 11:32 AM

## 2019-01-25 ENCOUNTER — Other Ambulatory Visit: Payer: Self-pay | Admitting: *Deleted

## 2019-01-25 NOTE — Patient Outreach (Signed)
[facility]) Care Management  01/25/2019  Madeline Taylor 1935/03/21 [id number]    EMMI-GENERAL DISCHARGE RED ON EMMI ALERT Day #4 Date:01/24/2019 Red Alert Reason:Sad/hoseless/anxious/empty  Outreach#1 RN attempted outreach and spoke with someone in the home who indicated pt not available. States pt was "down and under the weather". RN offered to consult if pt needed some assistance at this time however informed to call back. RN scheduled a call back for tomorrow when pt would be available.  Plan: Will follow up tomorrow as scheduled.  Elliot Cousin, RN Care Management Coordinator [facility] Main Office [phone]

## 2019-01-26 ENCOUNTER — Other Ambulatory Visit: Payer: Self-pay | Admitting: *Deleted

## 2019-01-26 NOTE — Patient Outreach (Signed)
[facility]) Care Management  01/26/2019  MIQUELA COSTABILE 08/10/1935 [id number]    EMMI-GENERAL DISCHARGE-RESOLVED RED ON EMMI ALERT Day # 4 Date:01/24/2019 Red Alert Reason: Lost of interest/ sad, hopeless, anxious, empty  Outreach #1 RN spoke with pt today and introduced the purpose for today's call. Provided permission to speak with her spouse Leory Plowman due to her loosing her voice. Pt in background stating she "feels a lot better today then yesterday and the day before". Spouse states pt is not eating well and they have contacted the provider (awaiting a call back). Based upon the discharge orders encouraged pt with low increase of mobility and liquids to start with increase her diet accordingly to the discharge orders. No other issues to address at this time.  PLAN: Will close case with the resolved emmi as mentioned above. No additional needs.  Elliot Cousin, RN Care Management Coordinator [facility] Main Office [phone]

## 2019-02-14 ENCOUNTER — Encounter: Payer: Self-pay | Admitting: Intensive Care

## 2019-02-14 ENCOUNTER — Other Ambulatory Visit: Payer: Self-pay

## 2019-02-14 ENCOUNTER — Ambulatory Visit: Payer: Self-pay

## 2019-02-14 ENCOUNTER — Emergency Department
Admission: EM | Admit: 2019-02-14 | Discharge: 2019-02-14 | Disposition: A | Discharge status: Home / Self Care | Payer: Medicare Other | Attending: Student | Admitting: Student

## 2019-02-14 DIAGNOSIS — Z5321 Procedure and treatment not carried out due to patient leaving prior to being seen by health care provider: Secondary | ICD-10-CM | POA: Insufficient documentation

## 2019-02-14 DIAGNOSIS — R627 Adult failure to thrive: Secondary | ICD-10-CM | POA: Diagnosis not present

## 2019-02-14 DIAGNOSIS — R630 Anorexia: Secondary | ICD-10-CM | POA: Diagnosis present

## 2019-02-14 LAB — CBC WITH DIFFERENTIAL/PLATELET
Abs Immature Granulocytes: 0.08 10*3/uL — ABNORMAL HIGH (ref 0.00–0.07)
Basophils Absolute: 0 10*3/uL (ref 0.0–0.1)
Basophils Relative: 0 %
Eosinophils Absolute: 0.1 10*3/uL (ref 0.0–0.5)
Eosinophils Relative: 1 %
HCT: 33.3 % — ABNORMAL LOW (ref 36.0–46.0)
Hemoglobin: 10.2 g/dL — ABNORMAL LOW (ref 12.0–15.0)
Immature Granulocytes: 1 %
Lymphocytes Relative: 20 %
Lymphs Abs: 1.8 10*3/uL (ref 0.7–4.0)
MCH: 28.7 pg (ref 26.0–34.0)
MCHC: 30.6 g/dL (ref 30.0–36.0)
MCV: 93.8 fL (ref 80.0–100.0)
Monocytes Absolute: 0.5 10*3/uL (ref 0.1–1.0)
Monocytes Relative: 6 %
Neutro Abs: 6.6 10*3/uL (ref 1.7–7.7)
Neutrophils Relative %: 72 %
Platelets: 335 10*3/uL (ref 150–400)
RBC: 3.55 MIL/uL — ABNORMAL LOW (ref 3.87–5.11)
RDW: 13.2 % (ref 11.5–15.5)
WBC: 9.1 10*3/uL (ref 4.0–10.5)
nRBC: 0 % (ref 0.0–0.2)

## 2019-02-14 LAB — COMPREHENSIVE METABOLIC PANEL
ALT: 12 U/L (ref 0–44)
AST: 20 U/L (ref 15–41)
Albumin: 2.9 g/dL — ABNORMAL LOW (ref 3.5–5.0)
Alkaline Phosphatase: 69 U/L (ref 38–126)
Anion gap: 15 (ref 5–15)
BUN: 10 mg/dL (ref 8–23)
CO2: 24 mmol/L (ref 22–32)
Calcium: 8.7 mg/dL — ABNORMAL LOW (ref 8.9–10.3)
Chloride: 95 mmol/L — ABNORMAL LOW (ref 98–111)
Creatinine, Ser: 0.6 mg/dL (ref 0.44–1.00)
GFR calc Af Amer: >60 mL/min (ref >60)
GFR calc non Af Amer: >60 mL/min (ref >60)
Glucose, Bld: 102 mg/dL — ABNORMAL HIGH (ref 70–99)
Potassium: 3.6 mmol/L (ref 3.5–5.1)
Sodium: 134 mmol/L — ABNORMAL LOW (ref 135–145)
Total Bilirubin: 1.3 mg/dL — ABNORMAL HIGH (ref 0.3–1.2)
Total Protein: 7.6 g/dL (ref 6.5–8.1)

## 2019-02-14 NOTE — Telephone Encounter (Signed)
Incoming call from Patient husband with a complaint that Patient wont eat .  Had gall bladder surgery has not lost appetite.   Patient husband reports Patient Patient has lost a lot of weight.  Husband states that she is skin and bones.  Only takes bites of her food.   Cant contact  PCP .  Denies pain referred Patient to Urgent care or Ed.  To be evaluated.         Answer Assessment - Initial Assessment Questions 1. DESCRIPTION: "Describe how you are feeling."    2. SEVERITY: "How bad is it?"  "Can you stand and walk?"   - MILD - Feels weak or tired, but does not interfere with work, school or normal activities   - MODERATE - Able to stand and walk; weakness interferes with work, school, or normal activities   - SEVERE - Unable to stand or walk     For a short period 3. ONSET:  "When did the weakness begin?"    After the gall bladder surgery  4. CAUSE: "What do you think is causing the weakness?"    Looks at food  States she does  5. MEDICINES: "Have you recently started a new medicine or had a change in the amount of a medicine?"     denies 6. OTHER SYMPTOMS: "Do you have any other symptoms?" (e.g., chest pain, fever, cough, SOB, vomiting, diarrhea, bleeding, other areas of pain)     denies 7. PREGNANCY: "Is there any chance you are pregnant?" "When was your last menstrual period?"     na  Protocols used: WEAKNESS (GENERALIZED) AND FATIGUE-A-AH

## 2019-02-14 NOTE — ED Provider Notes (Signed)
Patient exiting her room stating she is leaving, upset by wait time, says she "has been waiting for 6 hours."  Asked patient to step back into her room as I was just coming to see and evaluate her and update her on her results. She continues to decline and states she would like to leave. Again offered to see her in her room, but she declines. Patient asked her RN to call her husband to come pick her up.   Updated patient that she is welcome to return and be evaluated as needed and especially if any symptoms worsen. She elects to leave AMA without being evaluated by an MD.    Miguel Aschoff., MD 02/14/19 [phone]

## 2019-02-14 NOTE — ED Notes (Signed)
Pt left room and states that she is leaving.  MD spoke with pt and asked her to come back to the room so she can help her.  Pt declined and appears upset about wait, attempted to get her to stay for help.  Pt requests that I call her husband to come get her.  Spoke with husband who is concerned about her leaving against medical advice, gave pt phone and after a brief conversation with her husband pt continues to leave.

## 2019-02-14 NOTE — ED Triage Notes (Signed)
Patient husbands brought patient in due to loss of appetite. He reports she has lost weight recently. Patient denies any symptoms and denies SOB or CP. A&O x4 in triage

## 2019-05-01 ENCOUNTER — Other Ambulatory Visit: Payer: Self-pay

## 2019-05-01 ENCOUNTER — Ambulatory Visit
Admission: EM | Admit: 2019-05-01 | Discharge: 2019-05-01 | Disposition: A | Discharge status: Home / Self Care | Payer: Medicare Other | Attending: Urgent Care | Admitting: Urgent Care

## 2019-05-01 ENCOUNTER — Encounter: Payer: Self-pay | Admitting: Emergency Medicine

## 2019-05-01 DIAGNOSIS — B029 Zoster without complications: Secondary | ICD-10-CM | POA: Diagnosis not present

## 2019-05-01 HISTORY — DX: Zoster without complications: B02.9

## 2019-05-01 HISTORY — DX: Acute pancreatitis without necrosis or infection, unspecified: K85.90

## 2019-05-01 MED ORDER — VALACYCLOVIR HCL 1 G PO TABS: 1000.0000 mg | ORAL_TABLET | Freq: Three times a day (TID) | ORAL | 0 refills | Status: DC

## 2019-05-01 NOTE — ED Triage Notes (Signed)
Pt has a blister like rash on her left arm. Started about 2-3 days ago. She started her entire arm hurts.

## 2019-05-01 NOTE — Discharge Instructions (Addendum)
It was very nice seeing you today in clinic. Thank you for entrusting me with your care.   Please utilize the medications that we discussed. Your prescriptions has been called in to your pharmacy.   Make arrangements to follow up with your regular doctor in 1 week for re-evaluation if not improving. If your symptoms/condition worsens, please seek follow up care either here or in the ER. Please remember, our [place] providers are "right here with you" when you need us.   Again, it was my pleasure to take care of you today. Thank you for choosing our clinic. I hope that you start to feel better quickly.   Tirso Laws, MSN, APRN, FNP-C, CEN Advanced Practice Provider [facility] [place] [facility] 

## 2019-05-02 ENCOUNTER — Encounter: Payer: Self-pay | Admitting: Urgent Care

## 2019-05-02 NOTE — ED Provider Notes (Signed)
[place], [place]   Name: Madeline Taylor DOB: July 30, 1935 MRN: [id number] CSN: [id number] PCP: Galen Manila, NP (Inactive)  Arrival date and time:  05/01/19 1125  Chief Complaint:  Rash  NOTE: Prior to seeing the patient today, I have reviewed the triage nursing documentation and vital signs. Clinical staff has updated patient's PMH/PSHx, current medication list, and drug allergies/intolerances to ensure comprehensive history available to assist in medical decision making.   History:   HPI: Madeline Taylor is a 84 y.o. female who presents today with complaints of rash to her LEFT anterior chest wall, LEFT neck, and LEFT upper extremity that initially declared 2-3 days ago. Rash started on her upper arm and has progressively spread. Patient denies any new medications, foods, soaps/body washes, laundry detergents, or cosmetics. She denies a past medical history significant for environmental allergies. She advises that she has never developed a skin eruption like this in the past. Rash is erythematous and painful; describes as a "burning" sensation. She has not appreciated any drainage associated with the rash. There is no facial involvement; no rash to periorbital, paranasal, or perioral areas. Patient notes that she is not experiencing any shortness of breath or sensation of pharyngeal/laryngeal fullness. She is noted to be seated in a comfortable position in the exam room with NAD noted. Despite her symptoms, patient has not taken any over the counter interventions to help improve/relieve her reported symptoms at home.    Past Medical History:  Diagnosis Date  . Essential hypertension 07/29/2016  . History of 2019 novel coronavirus disease (COVID-19) 01/16/2019  . History of vertigo 07/29/2016  . Hyperlipidemia 07/29/2016  . Osteoarthritis of back 07/29/2016  . Osteopenia of multiple sites 07/29/2016  . Pancreatitis   . Seasonal allergies 07/29/2016  . Shingles 05/01/2019  . Urinary,  incontinence, stress female 07/29/2016    Past Surgical History:  Procedure Laterality Date  . CHOLECYSTECTOMY    . no past surgery      Family History  Problem Relation Age of Onset  . Breast cancer Sister   . Cancer Sister 67       breast cancer  . Alzheimer's disease Mother   . Healthy Father     Social History   Tobacco Use  . Smoking status: Never Smoker  . Smokeless tobacco: Never Used  Substance Use Topics  . Alcohol use: No  . Drug use: No    Patient Active Problem List   Diagnosis Date Noted  . Malnutrition of moderate degree 01/03/2019  . Pancreatitis 12/31/2018  . Gallstone 12/31/2018  . Osteoarthritis of multiple joints 05/18/2018  . OAB (overactive bladder) 10/16/2016  . Hyperlipidemia 07/29/2016  . Essential hypertension 07/29/2016  . Osteoporosis of femur without pathological fracture 07/29/2016  . Seasonal allergies 07/29/2016  . History of vertigo 07/29/2016  . Urinary, incontinence, stress female 07/29/2016  . Osteoarthritis of back 07/29/2016    Home Medications:    Current Meds  Medication Sig  . aspirin 81 MG tablet Take 81 mg by mouth daily.    Allergies:   Lisinopril and Statins  Review of Systems (ROS):  Review of systems NEGATIVE unless otherwise noted in narrative H&P section.   Vital Signs: Today's Vitals   05/01/19 1201 05/01/19 1202 05/01/19 1207 05/01/19 1232  BP:   (!) 176/91   Pulse:   78   Resp:   18   Temp:   (!) 97.5 F (36.4 C)   TempSrc:   Oral   SpO2:  100%   Weight:  76 lb 0.9 oz (34.5 kg)    Height:  4\' 10"  (1.473 m)    PainSc: 5    5     Physical Exam: Physical Exam  Constitutional: She is oriented to person, place, and time and well-developed, well-nourished, and in no distress.  HENT:  Head: Normocephalic and atraumatic.  Eyes: Pupils are equal, round, and reactive to light.  Cardiovascular: Normal rate, regular rhythm, normal heart sounds and intact distal pulses.  Pulmonary/Chest: Effort  normal and breath sounds normal.  Musculoskeletal:     Cervical back: Normal range of motion and neck supple.  Neurological: She is alert and oriented to person, place, and time. Gait normal.  Skin: Skin is warm and dry. Rash (herpetic) noted. She is not diaphoretic.  Diffusely distributed rash to LEFT chest, neck, and arm. Rash follows along single dermatomal pattern and does not cross the midline. (+) TTP. No drainage.   Psychiatric: Memory, affect and judgment normal. Her mood appears anxious.  Nursing note and vitals reviewed.   Urgent Care Treatments / Results:   No orders of the defined types were placed in this encounter.   LABS: PLEASE NOTE: all labs that were ordered this encounter are listed, however only abnormal results are displayed. Labs Reviewed - No data to display  EKG: -None  RADIOLOGY: No results found.  PROCEDURES: Procedures  MEDICATIONS RECEIVED THIS VISIT: Medications - No data to display  PERTINENT CLINICAL COURSE NOTES/UPDATES:   Initial Impression / Assessment and Plan / Urgent Care Course:  Pertinent labs & imaging results that were available during my care of the patient were personally reviewed by me and considered in my medical decision making (see lab/imaging section of note for values and interpretations).  Madeline Taylor is a 84 y.o. female who presents to [facility] today with complaints of Rash  Patient is well appearing overall in clinic today. She does not appear to be in any acute distress. Presenting symptoms (see HPI) and exam as documented above. Symptoms worsening over the last 2-3 days. Rash follows along single dermatomal pattern and dose not cross midline. Exam consistent with HZV rash. Will treat with a 7 day course of oral valacyclovir. Patient advised to avoid touching areas. She was educated that until rash dries out and crusts over, she is considered contagious. Recommended washing clothing and all linens in hot water to  avoid spread/reinfection/transmission to others. Offered pain medication, however patient declined and advised that she would use APAP and IBU. She was advised to return call to the clinic should be feel as if she needs something sent in, as she has a significant herpetic rash.   Discussed follow up with primary care physician in 1 week for re-evaluation. I have reviewed the follow up and strict return precautions for any new or worsening symptoms. Patient is aware of symptoms that would be deemed urgent/emergent, and would thus require further evaluation either here or in the emergency department. At the time of discharge, she verbalized understanding and consent with the discharge plan as it was reviewed with her. All questions were fielded by provider and/or clinic staff prior to patient discharge.    Final Clinical Impressions / Urgent Care Diagnoses:   Final diagnoses:  Herpes zoster without complication    New Prescriptions:  [place] Controlled Substance Registry consulted? Not Applicable  Meds ordered this encounter  Medications  . valACYclovir (VALTREX) 1000 MG tablet    Sig: Take 1 tablet (1,000  mg total) by mouth 3 (three) times daily.    Dispense:  21 tablet    Refill:  0    Recommended Follow up Care:  Patient encouraged to follow up with the following provider within the specified time frame, or sooner as dictated by the severity of her symptoms. As always, she was instructed that for any urgent/emergent care needs, she should seek care either here or in the emergency department for more immediate evaluation.  Follow-up Information    Mikey College, NP In 1 week.   Specialty: Nurse Practitioner Why: General reassessment of symptoms if not improving Contact information: [address] [place] [address] [phone]         NOTE: This note was prepared using Dragon dictation software along with smaller phrase technology. Despite my best ability to proofread, there is  the potential that transcriptional errors may still occur from this process, and are completely unintentional.    Karen Kitchens, NP 05/02/19 2242

## 2019-11-10 DIAGNOSIS — I10 Essential (primary) hypertension: Secondary | ICD-10-CM | POA: Diagnosis not present

## 2019-11-10 DIAGNOSIS — Z5321 Procedure and treatment not carried out due to patient leaving prior to being seen by health care provider: Secondary | ICD-10-CM | POA: Diagnosis not present

## 2019-11-10 DIAGNOSIS — K0889 Other specified disorders of teeth and supporting structures: Secondary | ICD-10-CM | POA: Diagnosis not present

## 2019-11-10 DIAGNOSIS — Z20822 Contact with and (suspected) exposure to covid-19: Secondary | ICD-10-CM | POA: Diagnosis not present

## 2020-02-13 ENCOUNTER — Ambulatory Visit: Payer: Medicare Other | Admitting: Family Medicine

## 2020-03-16 ENCOUNTER — Emergency Department: Payer: Medicare Other

## 2020-03-16 ENCOUNTER — Other Ambulatory Visit: Payer: Self-pay

## 2020-03-16 ENCOUNTER — Emergency Department
Admission: EM | Admit: 2020-03-16 | Discharge: 2020-03-16 | Disposition: A | Discharge status: Home / Self Care | Payer: Medicare Other | Attending: Emergency Medicine | Admitting: Emergency Medicine

## 2020-03-16 DIAGNOSIS — Z8616 Personal history of COVID-19: Secondary | ICD-10-CM | POA: Diagnosis not present

## 2020-03-16 DIAGNOSIS — R569 Unspecified convulsions: Secondary | ICD-10-CM | POA: Insufficient documentation

## 2020-03-16 DIAGNOSIS — R404 Transient alteration of awareness: Secondary | ICD-10-CM | POA: Diagnosis not present

## 2020-03-16 DIAGNOSIS — N390 Urinary tract infection, site not specified: Secondary | ICD-10-CM | POA: Diagnosis not present

## 2020-03-16 DIAGNOSIS — I10 Essential (primary) hypertension: Secondary | ICD-10-CM | POA: Diagnosis not present

## 2020-03-16 DIAGNOSIS — R5381 Other malaise: Secondary | ICD-10-CM | POA: Diagnosis not present

## 2020-03-16 DIAGNOSIS — R4182 Altered mental status, unspecified: Secondary | ICD-10-CM | POA: Diagnosis not present

## 2020-03-16 DIAGNOSIS — Z7982 Long term (current) use of aspirin: Secondary | ICD-10-CM | POA: Insufficient documentation

## 2020-03-16 DIAGNOSIS — R32 Unspecified urinary incontinence: Secondary | ICD-10-CM | POA: Diagnosis present

## 2020-03-16 DIAGNOSIS — I6782 Cerebral ischemia: Secondary | ICD-10-CM | POA: Diagnosis not present

## 2020-03-16 DIAGNOSIS — G319 Degenerative disease of nervous system, unspecified: Secondary | ICD-10-CM | POA: Diagnosis not present

## 2020-03-16 LAB — COMPREHENSIVE METABOLIC PANEL
ALT: 21 U/L (ref 0–44)
AST: 24 U/L (ref 15–41)
Albumin: 4 g/dL (ref 3.5–5.0)
Alkaline Phosphatase: 66 U/L (ref 38–126)
Anion gap: 11 (ref 5–15)
BUN: 24 mg/dL — ABNORMAL HIGH (ref 8–23)
CO2: 23 mmol/L (ref 22–32)
Calcium: 8.8 mg/dL — ABNORMAL LOW (ref 8.9–10.3)
Chloride: 106 mmol/L (ref 98–111)
Creatinine, Ser: 0.7 mg/dL (ref 0.44–1.00)
GFR, Estimated: >60 mL/min (ref >60)
Glucose, Bld: 114 mg/dL — ABNORMAL HIGH (ref 70–99)
Potassium: 3.5 mmol/L (ref 3.5–5.1)
Sodium: 140 mmol/L (ref 135–145)
Total Bilirubin: 0.7 mg/dL (ref 0.3–1.2)
Total Protein: 8.2 g/dL — ABNORMAL HIGH (ref 6.5–8.1)

## 2020-03-16 LAB — CBC WITH DIFFERENTIAL/PLATELET
Abs Immature Granulocytes: 0.05 10*3/uL (ref 0.00–0.07)
Basophils Absolute: 0 10*3/uL (ref 0.0–0.1)
Basophils Relative: 0 %
Eosinophils Absolute: 0 10*3/uL (ref 0.0–0.5)
Eosinophils Relative: 0 %
HCT: 40.9 % (ref 36.0–46.0)
Hemoglobin: 13 g/dL (ref 12.0–15.0)
Immature Granulocytes: 1 %
Lymphocytes Relative: 7 %
Lymphs Abs: 0.6 10*3/uL — ABNORMAL LOW (ref 0.7–4.0)
MCH: 28.4 pg (ref 26.0–34.0)
MCHC: 31.8 g/dL (ref 30.0–36.0)
MCV: 89.5 fL (ref 80.0–100.0)
Monocytes Absolute: 0.1 10*3/uL (ref 0.1–1.0)
Monocytes Relative: 2 %
Neutro Abs: 7.8 10*3/uL — ABNORMAL HIGH (ref 1.7–7.7)
Neutrophils Relative %: 90 %
Platelets: 180 10*3/uL (ref 150–400)
RBC: 4.57 MIL/uL (ref 3.87–5.11)
RDW: 13.7 % (ref 11.5–15.5)
WBC: 8.6 10*3/uL (ref 4.0–10.5)
nRBC: 0 % (ref 0.0–0.2)

## 2020-03-16 LAB — URINALYSIS, COMPLETE (UACMP) WITH MICROSCOPIC
Bilirubin Urine: NEGATIVE
Glucose, UA: NEGATIVE mg/dL
Ketones, ur: 5 mg/dL — AB
Nitrite: POSITIVE — AB
Protein, ur: 30 mg/dL — AB
Specific Gravity, Urine: 1.017 (ref 1.005–1.030)
WBC, UA: >50 WBC/hpf — ABNORMAL HIGH (ref 0–5)
pH: 5 (ref 5.0–8.0)

## 2020-03-16 LAB — PROTIME-INR
INR: 1.1 (ref 0.8–1.2)
Prothrombin Time: 13.3 seconds (ref 11.4–15.2)

## 2020-03-16 LAB — TROPONIN I (HIGH SENSITIVITY): Troponin I (High Sensitivity): 7 ng/L (ref <18)

## 2020-03-16 IMAGING — CT CT HEAD W/O CM
3 series · 16 of 47 positions shown, 19 images · non-contrast
Comparison: None.

CLINICAL DATA: Seizure-like episode.

EXAM:
CT HEAD WITHOUT CONTRAST
TECHNIQUE: Contiguous axial images were obtained from the base of the skull
through the vertex without intravenous contrast.

[Series 2: head wo · axial · 0.38mm/px · z∈[+897,+1022]mm · 10 of 30 slices shown, 13 images]
[im 3/30  brain]
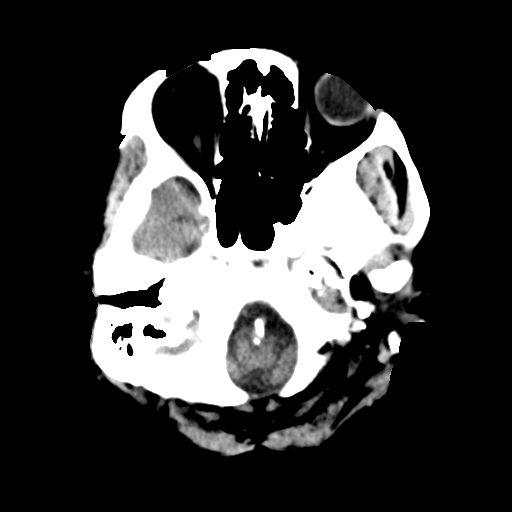
[im 3/30  bone]
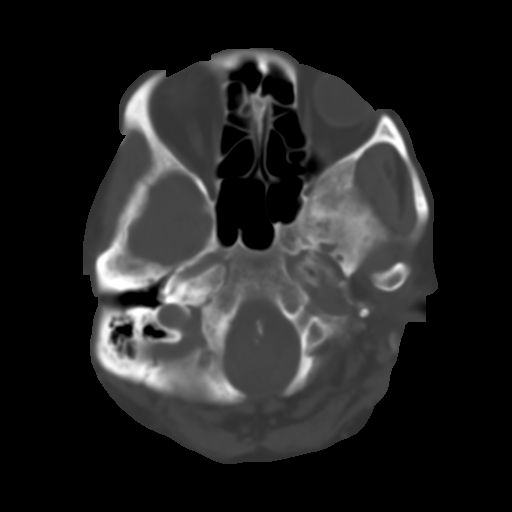
[im 6/30  brain]
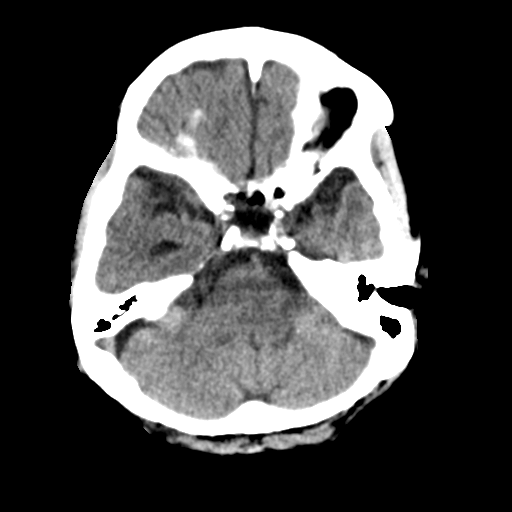
[im 9/30  brain]
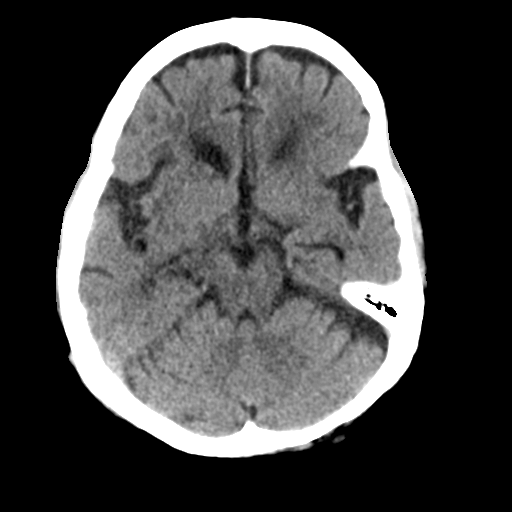
[im 11/30  brain]
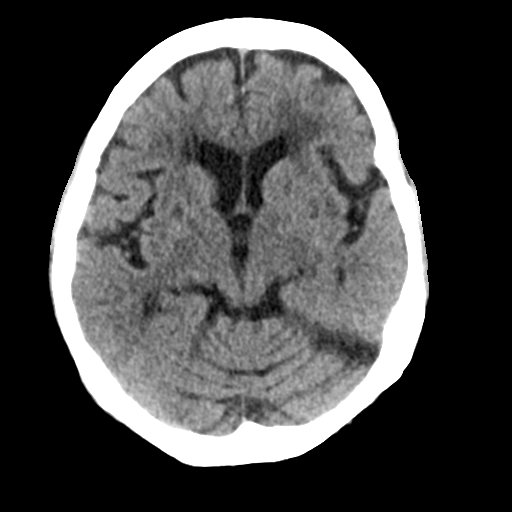
[im 14/30  brain]
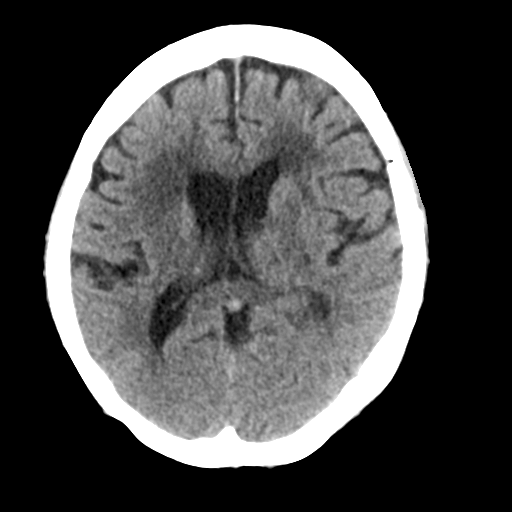
[im 14/30  bone]
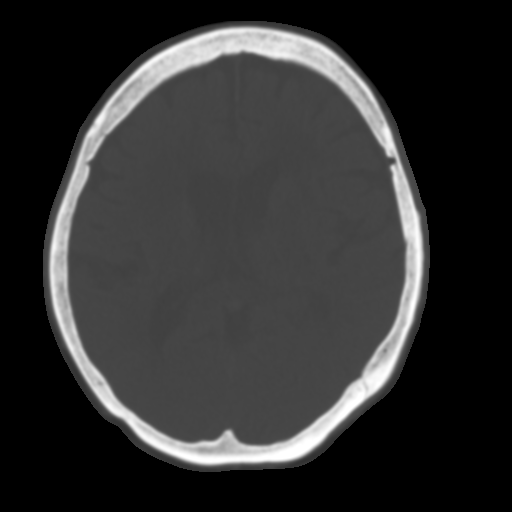
[im 17/30  brain]
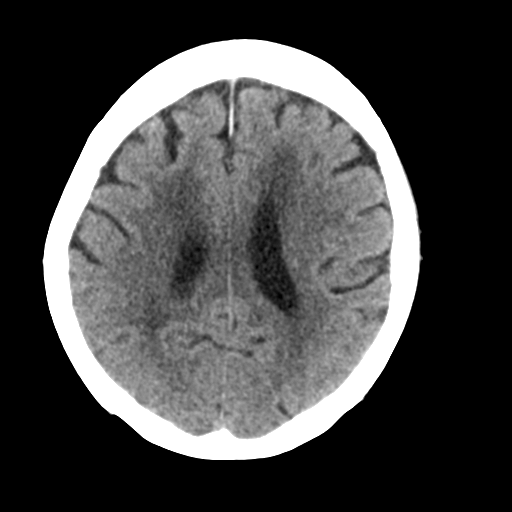
[im 20/30  brain]
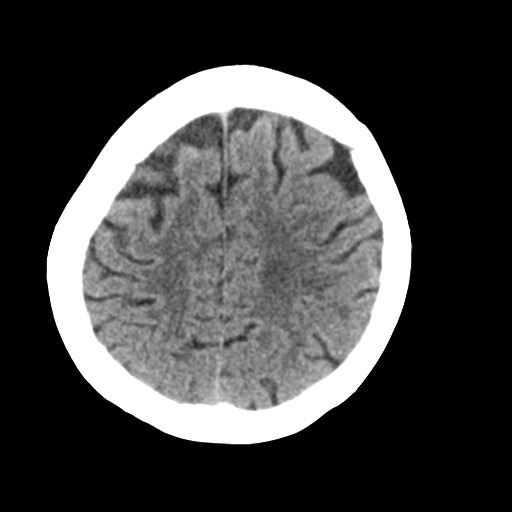
[im 23/30  brain]
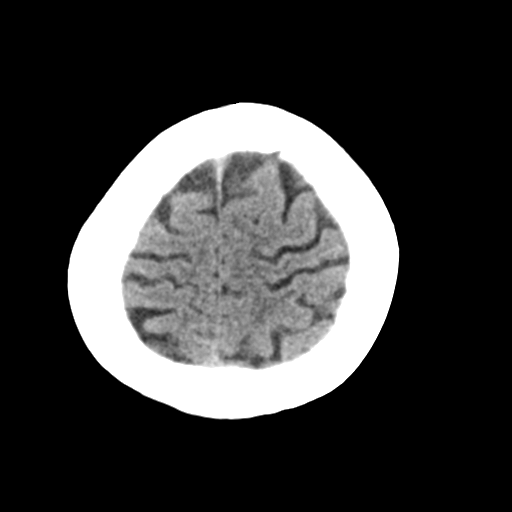
[im 25/30  brain]
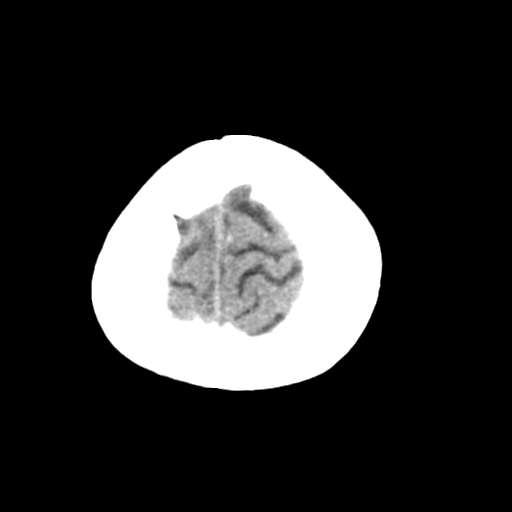
[im 25/30  bone]
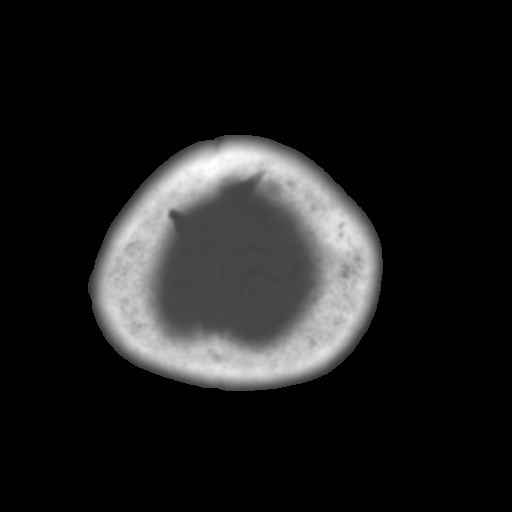
[im 28/30  brain]
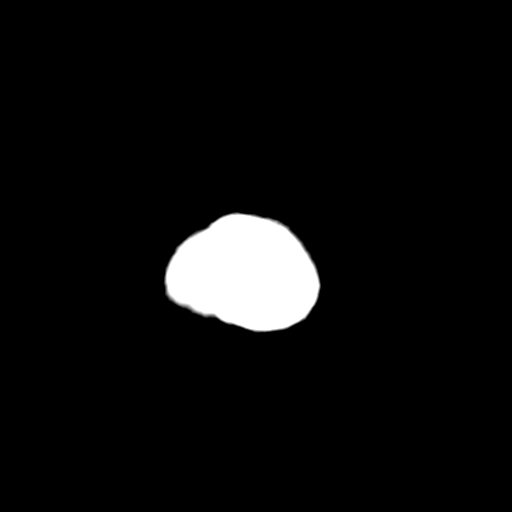

[Series 4: coronal soft tissue · coronal · 0.30mm/px · 3 of 61 slices shown]
[im 21/61  brain]
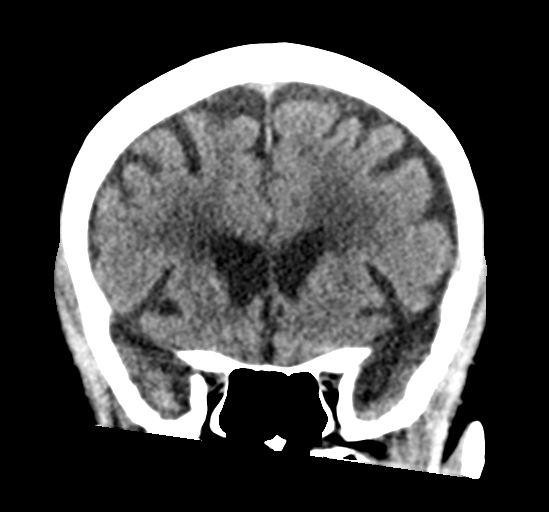
[im 27/61  brain]
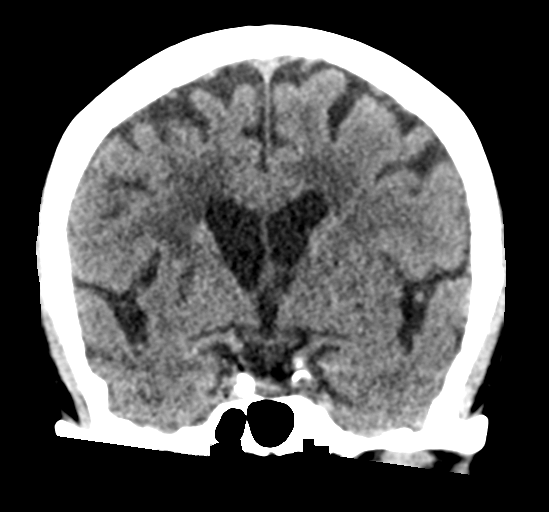
[im 34/61  brain]
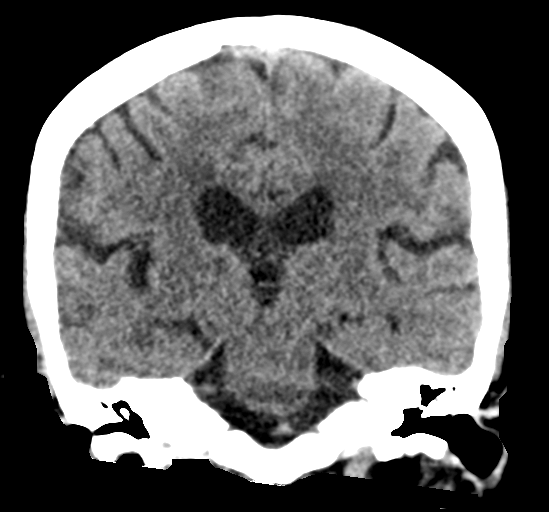

[Series 5: sagittal soft tissue · sagittal · 0.30mm/px · 3 of 55 slices shown]
[im 20/55  brain]
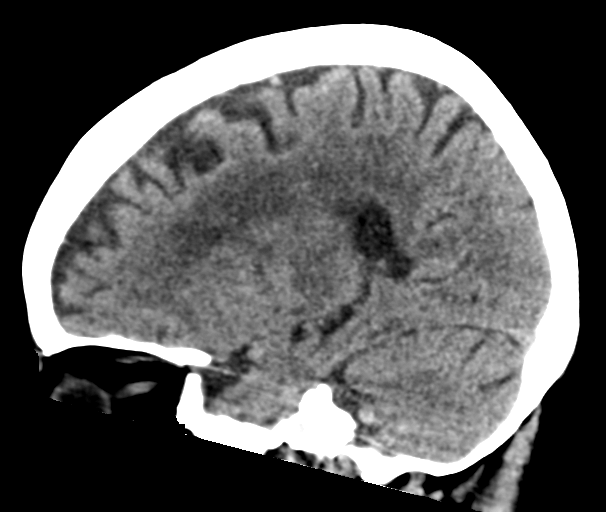
[im 28/55  brain]
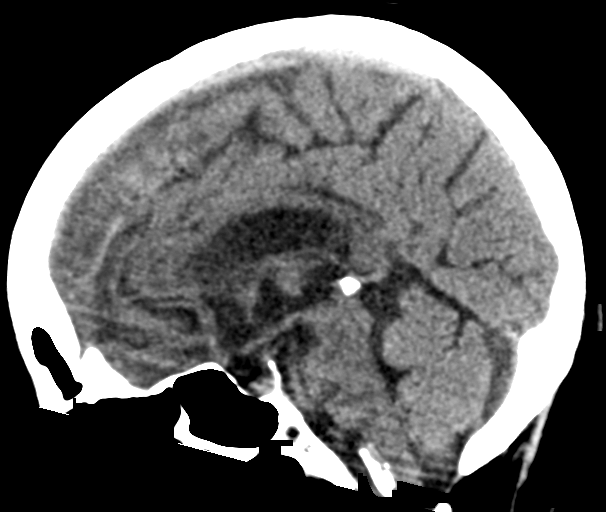
[im 36/55  brain]
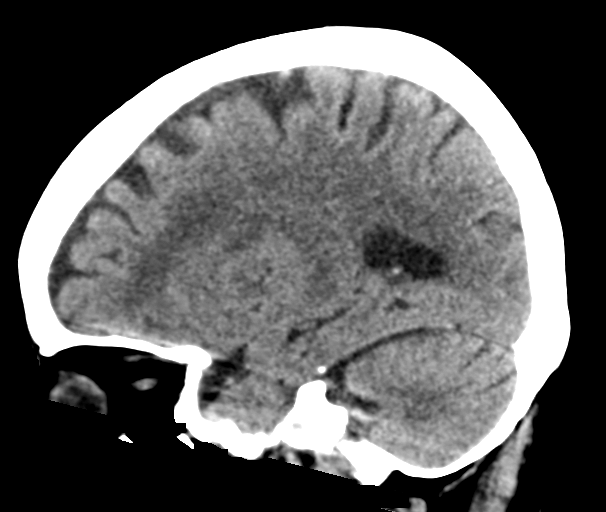

[16 of 47 positions shown; findings below may reference images not displayed]

FINDINGS: Brain:

Patchy and confluent areas of decreased attenuation are noted
throughout the deep and periventricular white matter of the cerebral
hemispheres bilaterally, compatible with chronic microvascular
ischemic disease. Chronic bilateral basal ganglia lacunar
infarction.

No evidence of large-territorial acute infarction. No parenchymal
hemorrhage. No mass lesion. No extra-axial collection.

No mass effect or midline shift. No hydrocephalus. Basilar cisterns
are patent.

Vascular: No hyperdense vessel. Atherosclerotic calcifications are
present within the cavernous internal carotid and vertebral
arteries.

Skull: No acute fracture or focal lesion.

Sinuses/Orbits: Paranasal sinuses and mastoid air cells are clear.
The orbits are unremarkable.

Other: None.
IMPRESSION: No acute intracranial abnormality.

## 2020-03-16 IMAGING — MR MR HEAD W/O CM
12 of 13 series · 41 of 48 positions shown · non-contrast
Comparison: [DATE].

CLINICAL DATA: seizure like episode

EXAM:
MRI HEAD WITHOUT CONTRAST
TECHNIQUE: Multiplanar, multiecho pulse sequences of the brain and surrounding
structures were obtained without intravenous contrast.

[Series 5: ax dwi_tracew · axial · 3.0mm · 0.60mm/px · z∈[-138,+16]mm · 7 of 96 slices shown]
[im 1/96]
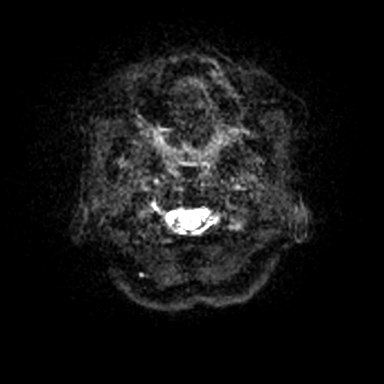
[im 16/96]
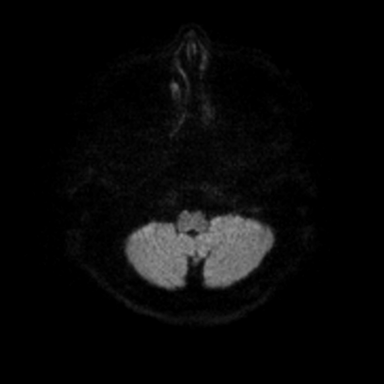
[im 32/96]
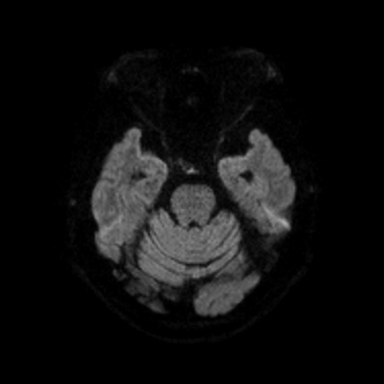
[im 48/96]
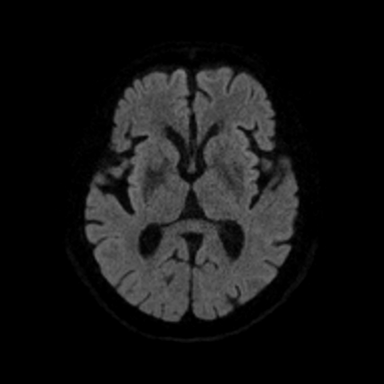
[im 64/96]
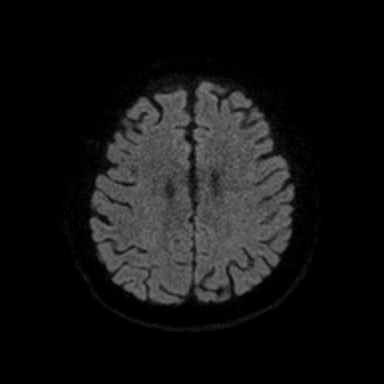
[im 80/96]
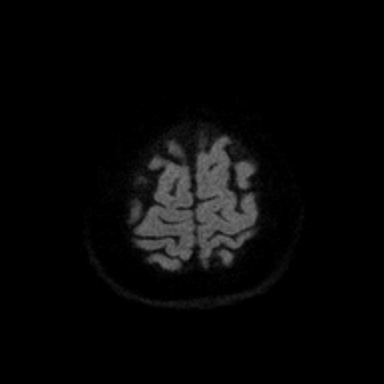
[im 96/96]
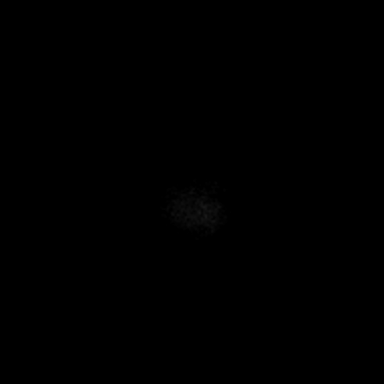

[Series 6: ax dwi_adc · axial · 3.0mm · 0.60mm/px · z∈[-138,+16]mm · 4 of 48 slices shown]
[im 1/48]
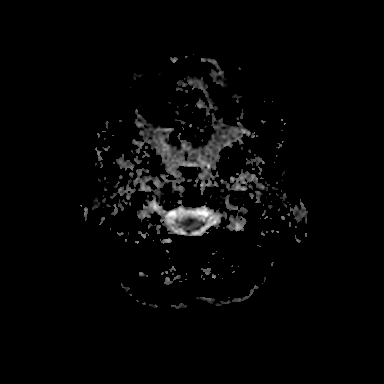
[im 16/48]
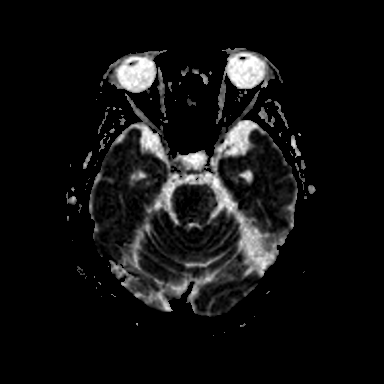
[im 32/48]
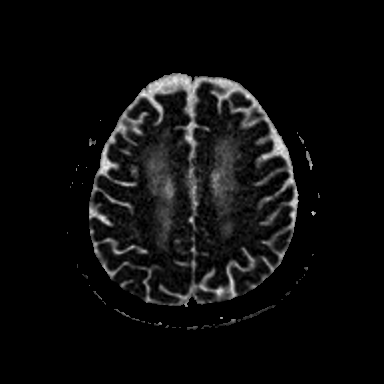
[im 48/48]
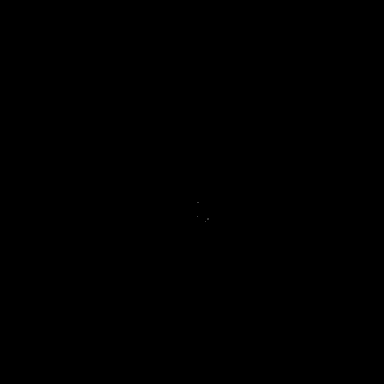

[Series 7: cor dwi_tracew · coronal · 5.0mm · 0.68mm/px · 6 of 78 slices shown]
[im 1/78]
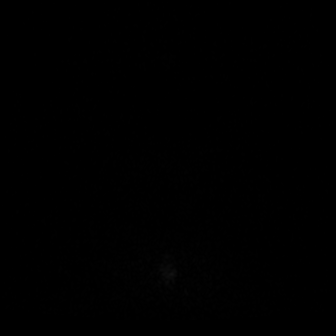
[im 16/78]
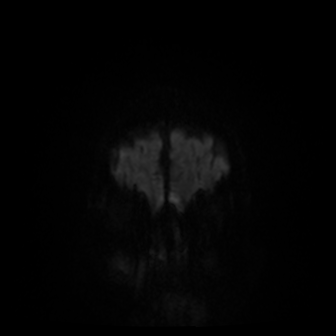
[im 31/78]
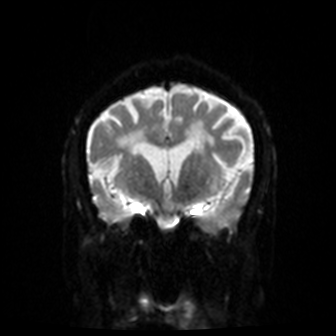
[im 47/78]
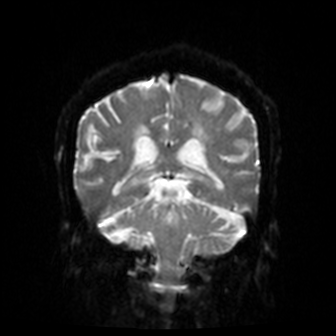
[im 62/78]
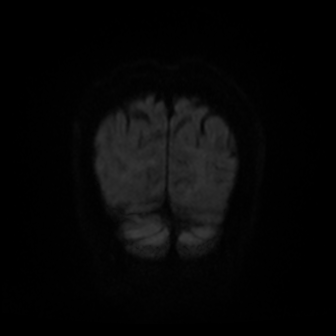
[im 78/78]
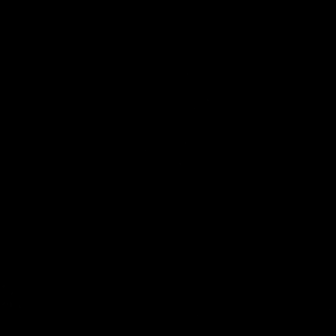

[Series 8: cor dwi_adc · coronal · 5.0mm · 0.68mm/px · 3 of 37 slices shown]
[im 1/37]
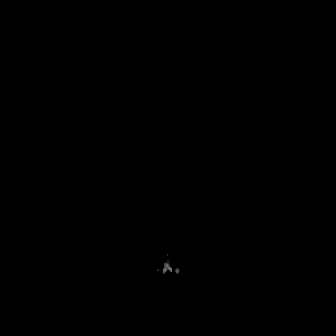
[im 19/37]
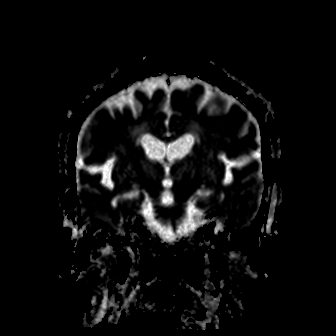
[im 37/37]
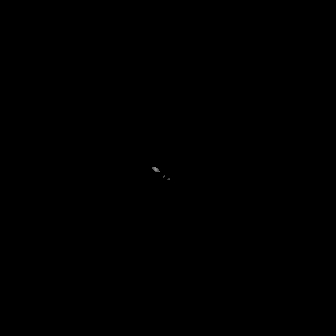

[Series 9: T1 · sagittal · 5.0mm · 0.62mm/px · 2 of 25 slices shown (1 of 2)]
[im 1/25]
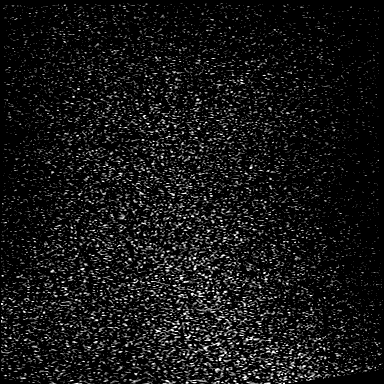
[im 25/25]
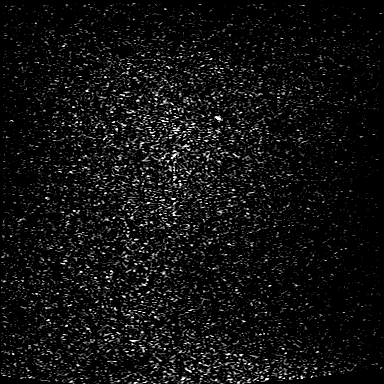

[Series 10: T2 · axial · 5.0mm · 0.53mm/px · z∈[-124,+19]mm · 2 of 25 slices shown (1 of 2)]
[im 1/25]
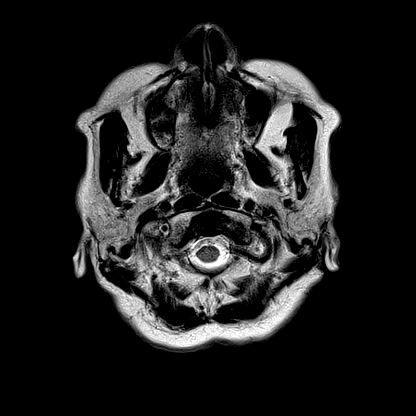
[im 25/25]
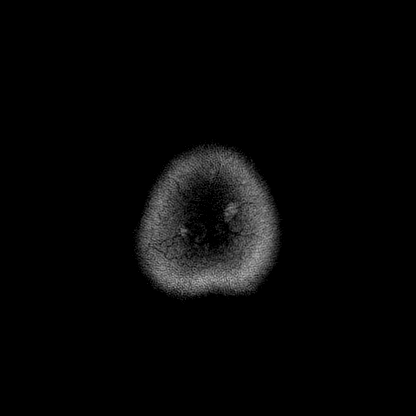

[Series 11: mag_images · axial · 3.0mm · 0.90mm/px · z∈[-140,+36]mm · 4 of 60 slices shown]
[im 1/60]
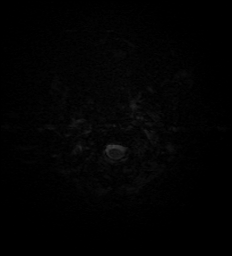
[im 20/60]
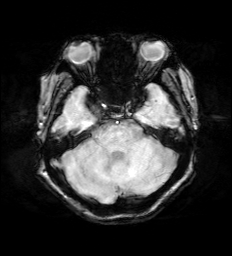
[im 40/60]
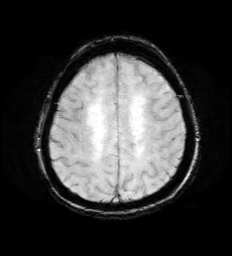
[im 60/60]
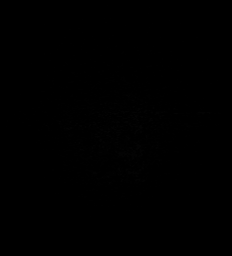

[Series 12: pha_images · axial · 3.0mm · 0.90mm/px · z∈[-140,+36]mm · 4 of 60 slices shown]
[im 1/60]
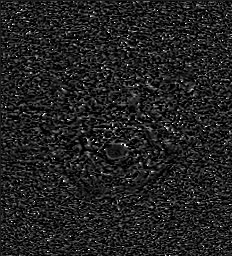
[im 20/60]
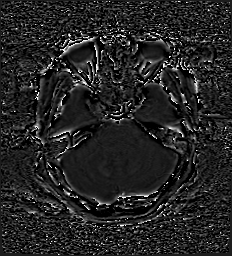
[im 40/60]
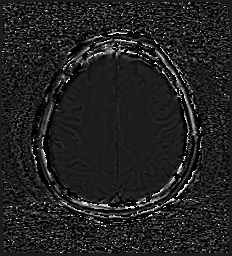
[im 60/60]
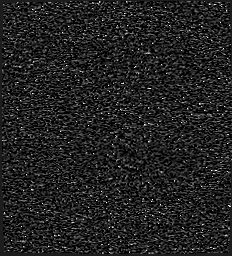

[Series 13: swi_images · axial · 3.0mm · 0.90mm/px · 1 of 60 slices shown]
[im 1/60]
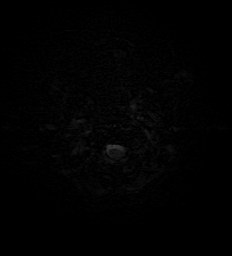

[Series 15: FLAIR · axial · 3.0mm · 0.53mm/px · z∈[-133,+28]mm · 4 of 55 slices shown]
[im 1/55]
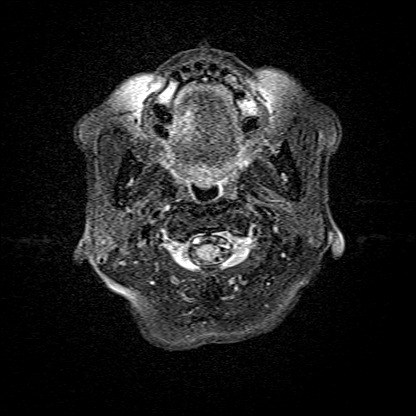
[im 19/55]
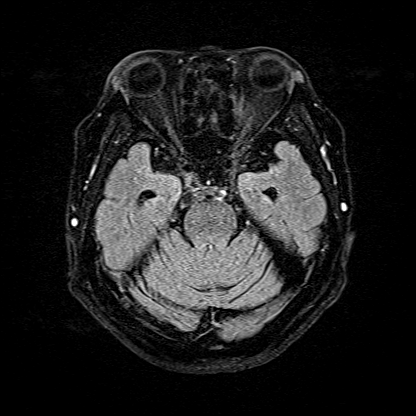
[im 37/55]
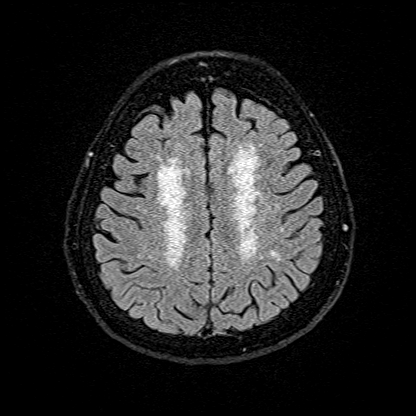
[im 55/55]
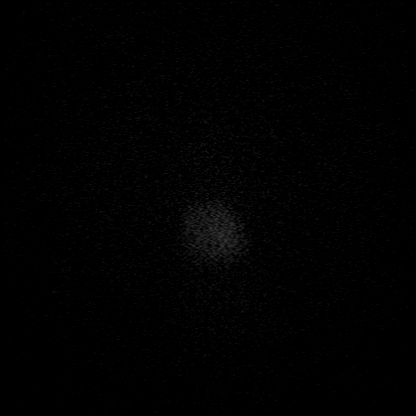

[Series 16: T1 · axial · 5.0mm · 0.90mm/px · z∈[-126,+29]mm · 2 of 27 slices shown (2 of 2)]
[im 1/27]
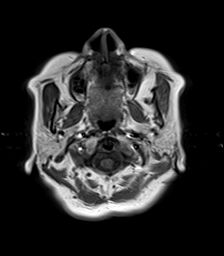
[im 27/27]
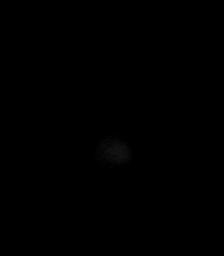

[Series 17: T2 · coronal · 5.0mm · 0.45mm/px · 2 of 31 slices shown (2 of 2)]
[im 1/31]
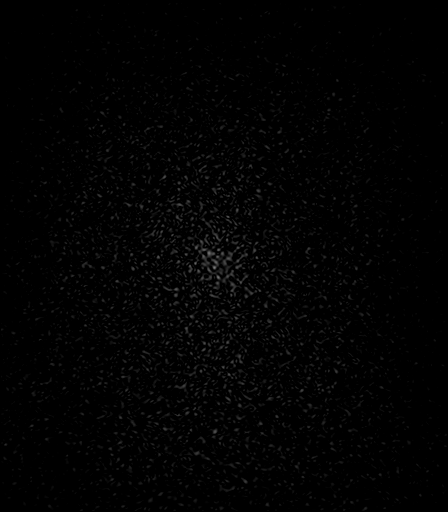
[im 31/31]
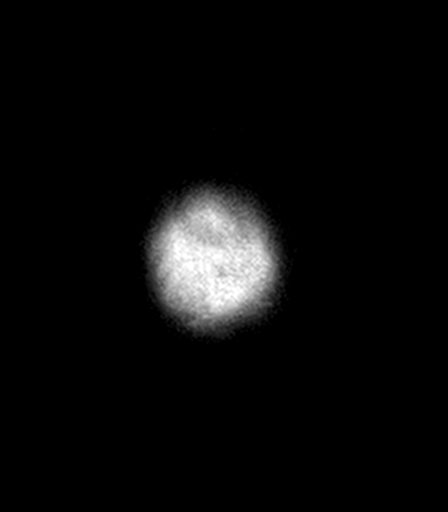

[41 of 48 positions shown; findings below may reference images not displayed]

FINDINGS: Brain: No diffusion-weighted signal abnormality. No intracranial
hemorrhage. No midline shift, ventriculomegaly or extra-axial fluid
collection. No mass lesion. Mild cerebral atrophy with ex vacuo
dilatation. Advanced chronic microvascular ischemic changes. Chronic
bilateral basal ganglia, right internal capsule and left thalamic
lacunar insults.

Vascular: Normal flow voids.

Skull and upper cervical spine: Normal marrow signal.

Sinuses/Orbits: Normal orbits. Clear paranasal sinuses. No mastoid
effusion.

Other: None.
IMPRESSION: No acute intracranial process. Multifocal chronic lacunar insults as
detailed above.

Mild cerebral atrophy. Advanced chronic microvascular ischemic
changes.

## 2020-03-16 MED ORDER — SODIUM CHLORIDE 0.9 % IV SOLN
1.0000 g | Freq: Once | INTRAVENOUS | Status: AC
  Administered 2020-03-16: 1 g via INTRAVENOUS
  Filled 2020-03-16: qty 10

## 2020-03-16 MED ORDER — CEPHALEXIN 500 MG PO CAPS: 500.0000 mg | ORAL_CAPSULE | Freq: Four times a day (QID) | ORAL | 0 refills | Status: AC

## 2020-03-16 NOTE — ED Notes (Signed)
Phone given to pt husband for MRI screening

## 2020-03-16 NOTE — ED Notes (Signed)
Urine collected and sent to lab at this time

## 2020-03-16 NOTE — ED Triage Notes (Signed)
Pt [facility] via [facility] after her husband noticed her shaking- after arriving to [facility] pt was noted to go unresponsive and shaking got worse, pt was incontinent of urine- pt has since become more responsive but is not back to baseline- no hx of seizures

## 2020-03-16 NOTE — ED Notes (Signed)
Pt went to CT

## 2020-03-16 NOTE — Discharge Instructions (Signed)
Please seek medical attention for any high fevers, chest pain, shortness of breath, change in behavior, persistent vomiting, bloody stool or any other new or concerning symptoms.  

## 2020-03-16 NOTE — ED Notes (Signed)
Assisted pt to walk to toilet to wipe off incontinent urine- bed sheets changed

## 2020-03-16 NOTE — ED Provider Notes (Signed)
[facility] Emergency Department Provider Note  ____________________________________________   I have reviewed the triage vital signs and the nursing notes.   HISTORY  Chief Complaint Altered Mental Status   History limited by: Not Limited   HPI Madeline Taylor is a 85 y.o. female who presents to the emergency department today after an apparent seizure-like episode.  The patient was with her husband at a restaurant when he noticed that she was starting to shake.  She was still awake but he states that she started to get a little slow.  They then went to [facility] where the patient was noted to go unresponsive and had some more shaking.  Patient was incontinent of urine.  The patient was then slow to recover to her mental baseline.  Patient has not had any similar symptoms in the past.  She denies any new medications.  Denies any recent illness.  Records reviewed. Per medical record review patient has a history of vertigo, hyperlipidemia.   Past Medical History:  Diagnosis Date  . Essential hypertension 07/29/2016  . History of 2019 novel coronavirus disease (COVID-19) 01/16/2019  . History of vertigo 07/29/2016  . Hyperlipidemia 07/29/2016  . Osteoarthritis of back 07/29/2016  . Osteopenia of multiple sites 07/29/2016  . Pancreatitis   . Seasonal allergies 07/29/2016  . Shingles 05/01/2019  . Urinary, incontinence, stress female 07/29/2016    Patient Active Problem List   Diagnosis Date Noted  . Malnutrition of moderate degree 01/03/2019  . Pancreatitis 12/31/2018  . Gallstone 12/31/2018  . Osteoarthritis of multiple joints 05/18/2018  . OAB (overactive bladder) 10/16/2016  . Hyperlipidemia 07/29/2016  . Essential hypertension 07/29/2016  . Osteoporosis of femur without pathological fracture 07/29/2016  . Seasonal allergies 07/29/2016  . History of vertigo 07/29/2016  . Urinary, incontinence, stress female 07/29/2016  . Osteoarthritis of back 07/29/2016     Past Surgical History:  Procedure Laterality Date  . CHOLECYSTECTOMY    . no past surgery      Prior to Admission medications   Medication Sig Start Date End Date Taking? Authorizing Provider  aspirin 81 MG tablet Take 81 mg by mouth daily.    [provider]  valACYclovir (VALTREX) 1000 MG tablet Take 1 tablet (1,000 mg total) by mouth 3 (three) times daily. 05/01/19   Verlee Monte, NP  metoprolol tartrate (LOPRESSOR) 25 MG tablet Take 1 tablet (25 mg total) by mouth 2 (two) times daily. Patient not taking: Reported on 01/24/2019 01/18/19 05/01/19  [name], MD    Allergies Lisinopril and Statins  Family History  Problem Relation Age of Onset  . Breast cancer Sister   . Cancer Sister 32       breast cancer  . Alzheimer's disease Mother   . Healthy Father     Social History Social History   Tobacco Use  . Smoking status: Never Smoker  . Smokeless tobacco: Never Used  Vaping Use  . Vaping Use: Never used  Substance Use Topics  . Alcohol use: No  . Drug use: No    Review of Systems Constitutional: No fever/chills Eyes: No visual changes. ENT: No sore throat. Cardiovascular: Denies chest pain. Respiratory: Denies shortness of breath. Gastrointestinal: No abdominal pain.  No nausea, no vomiting.  No diarrhea.   Genitourinary: Negative for dysuria. Musculoskeletal: Negative for back pain. Skin: Negative for rash. Neurological: Positive for seizure like episode.  ____________________________________________   PHYSICAL EXAM:  VITAL SIGNS: ED Triage Vitals  Enc Vitals Group  BP 03/16/20 1501 (!) 189/76     Pulse Rate 03/16/20 1501 95     Resp 03/16/20 1501 (!) 24     Temp 03/16/20 1501 98.5 F (36.9 C)     Temp Source 03/16/20 1501 Oral     SpO2 03/16/20 1501 94 %     Weight 03/16/20 1456 114 lb 3.2 oz (51.8 kg)     Height 03/16/20 1456 5\' 2"  (1.575 m)   Constitutional: Alert and oriented.  Eyes: Conjunctivae are normal.  ENT      Head:  Normocephalic and atraumatic.      Nose: No congestion/rhinnorhea.      Mouth/Throat: Mucous membranes are moist.      Neck: No stridor. Hematological/Lymphatic/Immunilogical: No cervical lymphadenopathy. Cardiovascular: Normal rate, regular rhythm.  No murmurs, rubs, or gallops.  Respiratory: Normal respiratory effort without tachypnea nor retractions. Breath sounds are clear and equal bilaterally. No wheezes/rales/rhonchi. Gastrointestinal: Soft and non tender. No rebound. No guarding.  Genitourinary: Deferred Musculoskeletal: Normal range of motion in all extremities. No lower extremity edema. Neurologic:  Normal speech and language. No gross focal neurologic deficits are appreciated.  Skin:  Skin is warm, dry and intact. No rash noted. Psychiatric: Mood and affect are normal. Speech and behavior are normal. Patient exhibits appropriate insight and judgment.  ____________________________________________    LABS (pertinent positives/negatives)  CBC wbc 8.6, hgb 13.0, plt 180 CMP na 140, k 3.5, glu 114, cr 0.70 UA hazy, moderate hgb dipstick, positive nitrite, large leukocytes, >50 wbc ____________________________________________   EKG  I, , attending physician, personally viewed and interpreted this EKG  EKG Time: 1457 Rate: 91 Rhythm: sinus rhythm Axis: right axis deviation Intervals: qtc 392 QRS: narrow ST changes: no st elevation Impression: abnormal ekg   ____________________________________________    RADIOLOGY  CT head No acute abnormality  MR brain No acute abnormality ____________________________________________   PROCEDURES  Procedures  ____________________________________________   INITIAL IMPRESSION / ASSESSMENT AND PLAN / ED COURSE  Pertinent labs & imaging results that were available during my care of the patient were reviewed by me and considered in my medical decision making (see chart for details).   Patient presented to  the emergency department today after an episode of shaking and then unresponsiveness.  This is unclear whether the patient truly had a epileptic episode.  Neuroimaging here in the emergency department without any acute abnormality.  Work-up was consistent with a urinary tract infection.  No leukocytosis or fever.  At this time I do not think patient is septic.  Possibly the patient had a vasovagal type episode with myoclonic jerking.  Patient was observed here in the emergency department for a number of hours without any other concerning episodes.  She was given IV antibiotics for the urinary tract infection.  Did discuss findings with patient and husband.  Did discuss the importance of very close follow-up with primary care.  ____________________________________________   FINAL CLINICAL IMPRESSION(S) / ED DIAGNOSES  Final diagnoses:  Lower urinary tract infectious disease     Note: This dictation was prepared with Dragon dictation. Any transcriptional errors that result from this process are unintentional     Phineas Semen, MD 03/16/20 1943

## 2020-03-16 NOTE — ED Notes (Signed)
Patient ambulated around the department with independent and steady gait.

## 2020-03-19 LAB — URINE CULTURE: Culture: >=100000 — AB

## 2020-05-02 DIAGNOSIS — M25552 Pain in left hip: Secondary | ICD-10-CM | POA: Diagnosis not present

## 2020-05-02 DIAGNOSIS — M25551 Pain in right hip: Secondary | ICD-10-CM | POA: Diagnosis not present

## 2020-11-17 ENCOUNTER — Encounter: Payer: Self-pay | Admitting: Internal Medicine

## 2020-11-17 ENCOUNTER — Ambulatory Visit (INDEPENDENT_AMBULATORY_CARE_PROVIDER_SITE_OTHER): Payer: Medicare Other | Admitting: Internal Medicine

## 2020-11-17 ENCOUNTER — Other Ambulatory Visit: Payer: Self-pay

## 2020-11-17 VITALS — BP 198/75 | HR 63 | Temp 97.1°F | Resp 18 | Ht 62.0 in | Wt 110.8 lb

## 2020-11-17 DIAGNOSIS — I700 Atherosclerosis of aorta: Secondary | ICD-10-CM

## 2020-11-17 DIAGNOSIS — M159 Polyosteoarthritis, unspecified: Secondary | ICD-10-CM | POA: Diagnosis not present

## 2020-11-17 DIAGNOSIS — Z23 Encounter for immunization: Secondary | ICD-10-CM

## 2020-11-17 DIAGNOSIS — Z789 Other specified health status: Secondary | ICD-10-CM

## 2020-11-17 DIAGNOSIS — F01A0 Vascular dementia, mild, without behavioral disturbance, psychotic disturbance, mood disturbance, and anxiety: Secondary | ICD-10-CM | POA: Diagnosis not present

## 2020-11-17 DIAGNOSIS — E782 Mixed hyperlipidemia: Secondary | ICD-10-CM

## 2020-11-17 DIAGNOSIS — M810 Age-related osteoporosis without current pathological fracture: Secondary | ICD-10-CM | POA: Diagnosis not present

## 2020-11-17 DIAGNOSIS — I10 Essential (primary) hypertension: Secondary | ICD-10-CM

## 2020-11-17 DIAGNOSIS — F0150 Vascular dementia without behavioral disturbance: Secondary | ICD-10-CM | POA: Insufficient documentation

## 2020-11-17 DIAGNOSIS — Z8673 Personal history of transient ischemic attack (TIA), and cerebral infarction without residual deficits: Secondary | ICD-10-CM | POA: Diagnosis not present

## 2020-11-17 DIAGNOSIS — M150 Primary generalized (osteo)arthritis: Secondary | ICD-10-CM

## 2020-11-17 MED ORDER — AMLODIPINE BESYLATE 10 MG PO TABS: 10.0000 mg | ORAL_TABLET | Freq: Every day | ORAL | 0 refills | Status: DC

## 2020-11-17 MED ORDER — ASPIRIN 81 MG PO TBEC: 81.0000 mg | DELAYED_RELEASE_TABLET | Freq: Every day | ORAL | 12 refills | Status: DC

## 2020-11-17 NOTE — Assessment & Plan Note (Signed)
Encouraged regular physical activity ?

## 2020-11-17 NOTE — Assessment & Plan Note (Signed)
Mild cognitive, stable functional needs Encouraged low fat diet, statin intolerant Encourage use of baby ASA daily

## 2020-11-17 NOTE — Assessment & Plan Note (Signed)
CMET And lipid profile today Encouraged her to consume a low fat diet Statin intolorant

## 2020-11-17 NOTE — Progress Notes (Signed)
Subjective:    Patient ID: Madeline Taylor, female    DOB: [birth year], 85 y.o.   MRN: [id number]  HPI  Pt presents to the clinic today for follow up of chronic conditions. She is establishing care with me today, transferring care from [name] NP.  HTN: Her BP today is 198/75. She is not taking any antihypertensive medications at this time. ECG from 02/2020 reviewed.  HLD with Aortic Atherosclerosis/Hx of TIA: Her last LDL was 177, triglycerides 254, 07/2016. She is not taking any cholesterol lowering medication at this time. She tries to consume a low fat diet.  OA: Multiple joints. She takes Bayer ASA as needed with good relief of symptoms.   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 Her husband reports she forgets a lot of things and is unable to drive.   Osteoporosis: She is not taking any Calcium or Vit D at this time. She does not get daily weight bearing exercise. Bone density from 07/2016 reviewed.  Review of Systems     Past Medical History:  Diagnosis Date   Essential hypertension 07/29/2016   History of 2019 novel coronavirus disease (COVID-19) 01/16/2019   History of vertigo 07/29/2016   Hyperlipidemia 07/29/2016   Osteoarthritis of back 07/29/2016   Osteopenia of multiple sites 07/29/2016   Pancreatitis    Seasonal allergies 07/29/2016   Shingles 05/01/2019   Urinary, incontinence, stress female 07/29/2016    Current Outpatient Medications  Medication Sig Dispense Refill   aspirin 81 MG tablet Take 81 mg by mouth daily. (Patient not taking: Reported on 11/17/2020)     valACYclovir (VALTREX) 1000 MG tablet Take 1 tablet (1,000 mg total) by mouth 3 (three) times daily. (Patient not taking: Reported on 11/17/2020) 21 tablet 0   No current facility-administered medications for this visit.    Allergies  Allergen Reactions   Lisinopril Cough   Statins     Family History  Problem Relation Age of Onset   Breast cancer Sister    Cancer Sister 16        breast cancer   Alzheimer's disease Mother    Healthy Father     Social History   Socioeconomic History   Marital status: Married    Spouse name: Not on file   Number of children: Not on file   Years of education: Not on file   Highest education level: 12th grade  Occupational History   Not on file  Tobacco Use   Smoking status: Never   Smokeless tobacco: Never  Vaping Use   Vaping Use: Never used  Substance and Sexual Activity   Alcohol use: No   Drug use: No   Sexual activity: Not Currently    Birth control/protection: None, Post-menopausal  Other Topics Concern   Not on file  Social History Narrative   Not on file   Social Determinants of Health   Financial Resource Strain: Not on file  Food Insecurity: Not on file  Transportation Needs: Not on file  Physical Activity: Not on file  Stress: Not on file  Social Connections: Not on file  Intimate Partner Violence: Not on file     Constitutional: Denies fever, malaise, fatigue, headache or abrupt weight changes.  HEENT: Denies eye pain, eye redness, ear pain, ringing in the ears, wax buildup, runny nose, nasal congestion, bloody nose, or sore throat. Respiratory: Denies difficulty breathing, shortness of breath, cough or sputum production.   Cardiovascular: Denies chest pain, chest tightness, palpitations or swelling  in the hands or feet.  Gastrointestinal: Denies abdominal pain, bloating, constipation, diarrhea or blood in the stool.  GU: Denies urgency, frequency, pain with urination, burning sensation, blood in urine, odor or discharge. Musculoskeletal: Pt reports joint pain. Denies decrease in range of motion, difficulty with gait, muscle pain or joint swelling.  Skin: Denies redness, rashes, lesions or ulcercations.  Neurological: Pt reports difficulty with memory. Denies dizziness, difficulty with speech or problems with balance and coordination.  Psych: Denies anxiety, depression, SI/HI.  No other  specific complaints in a complete review of systems (except as listed in HPI above).  Objective:   Physical Exam  BP (!) 198/75 (BP Location: Right Arm, Patient Position: Sitting, Cuff Size: Small)   Pulse 63   Temp (!) 97.1 F (36.2 C) (Temporal)   Resp 18   Ht 5\' 2"  (1.575 m)   Wt 110 lb 12.8 oz (50.3 kg)   SpO2 100%   BMI 20.27 kg/m   Wt Readings from Last 3 Encounters:  03/16/20 114 lb 3.2 oz (51.8 kg)  05/01/19 76 lb 0.9 oz (34.5 kg)  02/14/19 76 lb (34.5 kg)    General: Appears her stated age, well developed, well nourished in NAD. Skin: Warm, dry and intact.  HEENT: Head: normal shape and size; Eyes: EOMs intact;  Cardiovascular: Normal rate and rhythm. S1,S2 noted.  No murmur, rubs or gallops noted. No JVD or BLE edema. No carotid bruits noted. Pulmonary/Chest: Normal effort and positive vesicular breath sounds. No respiratory distress. No wheezes, rales or ronchi noted.  Musculoskeletal: No difficulty with gait.  Neurological: Alert and oriented.  Psychiatric: Mood and affect normal. Behavior is normal. Judgment and thought content normal.     BMET    Component Value Date/Time   NA 140 03/16/2020 1502   K 3.5 03/16/2020 1502   CL 106 03/16/2020 1502   CO2 23 03/16/2020 1502   GLUCOSE 114 (H) 03/16/2020 1502   BUN 24 (H) 03/16/2020 1502   CREATININE 0.70 03/16/2020 1502   CREATININE 0.82 02/17/2018 0840   CALCIUM 8.8 (L) 03/16/2020 1502   GFRNONAA >60 03/16/2020 1502   GFRNONAA 68 02/17/2018 0840   GFRAA >60 02/14/2019 1434   GFRAA 78 02/17/2018 0840    Lipid Panel     Component Value Date/Time   CHOL 280 (H) 07/29/2016 0823   TRIG 254 (H) 07/29/2016 0823   HDL 52 07/29/2016 0823   CHOLHDL 5.4 (H) 07/29/2016 0823   VLDL 51 (H) 07/29/2016 0823   LDLCALC 177 (H) 07/29/2016 0823    CBC    Component Value Date/Time   WBC 8.6 03/16/2020 1502   RBC 4.57 03/16/2020 1502   HGB 13.0 03/16/2020 1502   HCT 40.9 03/16/2020 1502   PLT 180 03/16/2020  1502   MCV 89.5 03/16/2020 1502   MCH 28.4 03/16/2020 1502   MCHC 31.8 03/16/2020 1502   RDW 13.7 03/16/2020 1502   LYMPHSABS 0.6 (L) 03/16/2020 1502   MONOABS 0.1 03/16/2020 1502   EOSABS 0.0 03/16/2020 1502   BASOSABS 0.0 03/16/2020 1502    Hgb A1C Lab Results  Component Value Date   HGBA1C 5.4 07/29/2016            Assessment & Plan:     09/29/2016, NP This visit occurred during the SARS-CoV-2 public health emergency.  Safety protocols were in place, including screening questions prior to the visit, additional usage of staff PPE, and extensive cleaning of exam room while observing appropriate contact time  as indicated for disinfecting solutions.

## 2020-11-17 NOTE — Assessment & Plan Note (Signed)
Uncontrolled RX for Amlodipine 10 mg daily Reinforced DASH diet CMET today

## 2020-11-17 NOTE — Patient Instructions (Signed)
Heart-Healthy Eating Plan Heart-healthy meal planning includes: Eating less unhealthy fats. Eating more healthy fats. Making other changes in your diet. Talk with your doctor or a diet specialist (dietitian) to create an eating plan that is right for you. What is my plan? Your doctor may recommend an eating plan that includes: Total fat: ______% or less of total calories a day. Saturated fat: ______% or less of total calories a day. Cholesterol: less than _________mg a day. What are tips for following this plan? Cooking Avoid frying your food. Try to bake, boil, grill, or broil it instead. You can also reduce fat by: Removing the skin from poultry. Removing all visible fats from meats. Steaming vegetables in water or broth. Meal planning  At meals, divide your plate into four equal parts: Fill one-half of your plate with vegetables and green salads. Fill one-fourth of your plate with whole grains. Fill one-fourth of your plate with lean protein foods. Eat 4-5 servings of vegetables per day. A serving of vegetables is: 1 cup of raw or cooked vegetables. 2 cups of raw leafy greens. Eat 4-5 servings of fruit per day. A serving of fruit is: 1 medium whole fruit.  cup of dried fruit.  cup of fresh, frozen, or canned fruit.  cup of 100% fruit juice. Eat more foods that have soluble fiber. These are apples, broccoli, carrots, beans, peas, and barley. Try to get 20-30 g of fiber per day. Eat 4-5 servings of nuts, legumes, and seeds per week: 1 serving of dried beans or legumes equals  cup after being cooked. 1 serving of nuts is  cup. 1 serving of seeds equals 1 tablespoon. General information Eat more home-cooked food. Eat less restaurant, buffet, and fast food. Limit or avoid alcohol. Limit foods that are high in starch and sugar. Avoid fried foods. Lose weight if you are overweight. Keep track of how much salt (sodium) you eat. This is important if you have high blood  pressure. Ask your doctor to tell you more about this. Try to add vegetarian meals each week. Fats Choose healthy fats. These include olive oil and canola oil, flaxseeds, walnuts, almonds, and seeds. Eat more omega-3 fats. These include salmon, mackerel, sardines, tuna, flaxseed oil, and ground flaxseeds. Try to eat fish at least 2 times each week. Check food labels. Avoid foods with trans fats or high amounts of saturated fat. Limit saturated fats. These are often found in animal products, such as meats, butter, and cream. These are also found in plant foods, such as palm oil, palm kernel oil, and coconut oil. Avoid foods with partially hydrogenated oils in them. These have trans fats. Examples are stick margarine, some tub margarines, cookies, crackers, and other baked goods. What foods can I eat? Fruits All fresh, canned (in natural juice), or frozen fruits. Vegetables Fresh or frozen vegetables (raw, steamed, roasted, or grilled). Green salads. Grains Most grains. Choose whole wheat and whole grains most of the time. Rice and pasta, including brown rice and pastas made with whole wheat. Meats and other proteins Lean, well-trimmed beef, veal, pork, and lamb. Chicken and turkey without skin. All fish and shellfish. Wild duck, rabbit, pheasant, and venison. Egg whites or low-cholesterol egg substitutes. Dried beans, peas, lentils, and tofu. Seeds and most nuts. Dairy Low-fat or nonfat cheeses, including ricotta and mozzarella. Skim or 1% milk that is liquid, powdered, or evaporated. Buttermilk that is made with low-fat milk. Nonfat or low-fat yogurt. Fats and oils Non-hydrogenated (trans-free) margarines. Vegetable oils, including   soybean, sesame, sunflower, olive, peanut, safflower, corn, canola, and cottonseed. Salad dressings or mayonnaise made with a vegetable oil. Beverages Mineral water. Coffee and tea. Diet carbonated beverages. Sweets and desserts Sherbet, gelatin, and fruit ice.  Small amounts of dark chocolate. Limit all sweets and desserts. Seasonings and condiments All seasonings and condiments. The items listed above may not be a complete list of foods and drinks you can eat. Contact a dietitian for more options. What foods should I avoid? Fruits Canned fruit in heavy syrup. Fruit in cream or butter sauce. Fried fruit. Limit coconut. Vegetables Vegetables cooked in cheese, cream, or butter sauce. Fried vegetables. Grains Breads that are made with saturated or trans fats, oils, or whole milk. Croissants. Sweet rolls. Donuts. High-fat crackers, such as cheese crackers. Meats and other proteins Fatty meats, such as hot dogs, ribs, sausage, bacon, rib-eye roast or steak. High-fat deli meats, such as salami and bologna. Caviar. Domestic duck and goose. Organ meats, such as liver. Dairy Cream, sour cream, cream cheese, and creamed cottage cheese. Whole-milk cheeses. Whole or 2% milk that is liquid, evaporated, or condensed. Whole buttermilk. Cream sauce or high-fat cheese sauce. Yogurt that is made from whole milk. Fats and oils Meat fat, or shortening. Cocoa butter, hydrogenated oils, palm oil, coconut oil, palm kernel oil. Solid fats and shortenings, including bacon fat, salt pork, lard, and butter. Nondairy cream substitutes. Salad dressings with cheese or sour cream. Beverages Regular sodas and juice drinks with added sugar. Sweets and desserts Frosting. Pudding. Cookies. Cakes. Pies. Milk chocolate or white chocolate. Buttered syrups. Full-fat ice cream or ice cream drinks. The items listed above may not be a complete list of foods and drinks to avoid. Contact a dietitian for more information. Summary Heart-healthy meal planning includes eating less unhealthy fats, eating more healthy fats, and making other changes in your diet. Eat a balanced diet. This includes fruits and vegetables, low-fat or nonfat dairy, lean protein, nuts and legumes, whole grains, and  heart-healthy oils and fats. This information is not intended to replace advice given to you by your health care provider. Make sure you discuss any questions you have with your health care provider. Document Revised: 05/15/2020 Document Reviewed: 05/15/2020 Elsevier Patient Education  2022 [employer]  

## 2020-11-17 NOTE — Assessment & Plan Note (Signed)
Encouraged Calcium and Vit D OTC Encouraged daily weight bearing exercise. Will repeat bone density at annual exam

## 2020-11-17 NOTE — Assessment & Plan Note (Signed)
CMET and lipid profile today Encouraged her to consume a low fat diet 

## 2020-11-17 NOTE — Assessment & Plan Note (Signed)
No residual effect Statin intolerant Discussed the importance of BP and cholesterol control Encouraged use of baby ASA daily

## 2020-11-18 LAB — LIPID PANEL
Cholesterol: 217 mg/dL — ABNORMAL HIGH (ref <200)
HDL: 57 mg/dL (ref >=50)
LDL Cholesterol (Calc): 124 mg/dL (calc) — ABNORMAL HIGH
Non-HDL Cholesterol (Calc): 160 mg/dL (calc) — ABNORMAL HIGH (ref <130)
Total CHOL/HDL Ratio: 3.8 (calc) (ref <5.0)
Triglycerides: 221 mg/dL — ABNORMAL HIGH (ref <150)

## 2020-11-18 LAB — COMPLETE METABOLIC PANEL WITH GFR
AG Ratio: 1.2 (calc) (ref 1.0–2.5)
ALT: 8 U/L (ref 6–29)
AST: 11 U/L (ref 10–35)
Albumin: 3.9 g/dL (ref 3.6–5.1)
Alkaline phosphatase (APISO): 67 U/L (ref 37–153)
BUN: 22 mg/dL (ref 7–25)
CO2: 27 mmol/L (ref 20–32)
Calcium: 8.5 mg/dL — ABNORMAL LOW (ref 8.6–10.4)
Chloride: 108 mmol/L (ref 98–110)
Creat: 0.77 mg/dL (ref 0.60–0.95)
Globulin: 3.3 g/dL (calc) (ref 1.9–3.7)
Glucose, Bld: 80 mg/dL (ref 65–99)
Potassium: 4.1 mmol/L (ref 3.5–5.3)
Sodium: 144 mmol/L (ref 135–146)
Total Bilirubin: 0.2 mg/dL (ref 0.2–1.2)
Total Protein: 7.2 g/dL (ref 6.1–8.1)
eGFR: 76 mL/min/{1.73_m2} (ref >=60)

## 2020-11-18 LAB — CBC
HCT: 38 % (ref 35.0–45.0)
Hemoglobin: 12.5 g/dL (ref 11.7–15.5)
MCH: 30 pg (ref 27.0–33.0)
MCHC: 32.9 g/dL (ref 32.0–36.0)
MCV: 91.1 fL (ref 80.0–100.0)
MPV: 9.3 fL (ref 7.5–12.5)
Platelets: 222 10*3/uL (ref 140–400)
RBC: 4.17 10*6/uL (ref 3.80–5.10)
RDW: 13.2 % (ref 11.0–15.0)
WBC: 5.7 10*3/uL (ref 3.8–10.8)

## 2020-11-20 MED ORDER — ATORVASTATIN CALCIUM 10 MG PO TABS: 10.0000 mg | ORAL_TABLET | Freq: Every day | ORAL | 2 refills | Status: DC

## 2020-11-20 NOTE — Addendum Note (Signed)
Addended by: [name] on: 11/20/2020 08:08 AM   Modules accepted: Orders

## 2020-12-01 ENCOUNTER — Ambulatory Visit: Payer: Medicare Other | Admitting: Internal Medicine

## 2020-12-01 NOTE — Progress Notes (Deleted)
Subjective:    Patient ID: Madeline Taylor, female    DOB: Apr 12, 1935, 85 y.o.   MRN: [id number]  HPI  Pt presents to the clinic today for 2 week follow up of HTN. At her last visit, she was started on Amlodipine. She has been taking the medication as prescribed. Her BP today is. ECG from 02/2020 reviewed.  Review of Systems     Past Medical History:  Diagnosis Date   Essential hypertension 07/29/2016   History of 2019 novel coronavirus disease (COVID-19) 01/16/2019   History of vertigo 07/29/2016   Hyperlipidemia 07/29/2016   Osteoarthritis of back 07/29/2016   Osteopenia of multiple sites 07/29/2016   Pancreatitis    Seasonal allergies 07/29/2016   Shingles 05/01/2019   Urinary, incontinence, stress female 07/29/2016    Current Outpatient Medications  Medication Sig Dispense Refill   amLODipine (NORVASC) 10 MG tablet Take 1 tablet (10 mg total) by mouth daily. 30 tablet 0   aspirin 81 MG EC tablet Take 1 tablet (81 mg total) by mouth daily. Swallow whole. 30 tablet 12   atorvastatin (LIPITOR) 10 MG tablet Take 1 tablet (10 mg total) by mouth daily. 30 tablet 2   No current facility-administered medications for this visit.    Allergies  Allergen Reactions   Lisinopril Cough   Statins     Family History  Problem Relation Age of Onset   Breast cancer Sister    Cancer Sister 74       breast cancer   Alzheimer's disease Mother    Healthy Father     Social History   Socioeconomic History   Marital status: Married    Spouse name: Not on file   Number of children: Not on file   Years of education: Not on file   Highest education level: 12th grade  Occupational History   Not on file  Tobacco Use   Smoking status: Never   Smokeless tobacco: Never  Vaping Use   Vaping Use: Never used  Substance and Sexual Activity   Alcohol use: No   Drug use: No   Sexual activity: Not Currently    Birth control/protection: None, Post-menopausal  Other Topics Concern   Not on file   Social History Narrative   Not on file   Social Determinants of Health   Financial Resource Strain: Not on file  Food Insecurity: Not on file  Transportation Needs: Not on file  Physical Activity: Not on file  Stress: Not on file  Social Connections: Not on file  Intimate Partner Violence: Not on file     Constitutional: Denies fever, malaise, fatigue, headache or abrupt weight changes.  HEENT: Denies eye pain, eye redness, ear pain, ringing in the ears, wax buildup, runny nose, nasal congestion, bloody nose, or sore throat. Respiratory: Denies difficulty breathing, shortness of breath, cough or sputum production.   Cardiovascular: Denies chest pain, chest tightness, palpitations or swelling in the hands or feet.  Gastrointestinal: Denies abdominal pain, bloating, constipation, diarrhea or blood in the stool.  GU: Denies urgency, frequency, pain with urination, burning sensation, blood in urine, odor or discharge. Musculoskeletal: Denies decrease in range of motion, difficulty with gait, muscle pain or joint pain and swelling.  Skin: Denies redness, rashes, lesions or ulcercations.  Neurological: Denies dizziness, difficulty with memory, difficulty with speech or problems with balance and coordination.  Psych: Denies anxiety, depression, SI/HI.  No other specific complaints in a complete review of systems (except as listed in HPI above).  Objective:   Physical Exam   There were no vitals taken for this visit. Wt Readings from Last 3 Encounters:  11/17/20 110 lb 12.8 oz (50.3 kg)  03/16/20 114 lb 3.2 oz (51.8 kg)  05/01/19 76 lb 0.9 oz (34.5 kg)    General: Appears their stated age, well developed, well nourished in NAD. Skin: Warm, dry and intact. No rashes, lesions or ulcerations noted. HEENT: Head: normal shape and size; Eyes: sclera white, no icterus, conjunctiva pink, PERRLA and EOMs intact; Ears: Tm's gray and intact, normal light reflex; Nose: mucosa pink and moist,  septum midline; Throat/Mouth: Teeth present, mucosa pink and moist, no exudate, lesions or ulcerations noted.  Neck:  Neck supple, trachea midline. No masses, lumps or thyromegaly present.  Cardiovascular: Normal rate and rhythm. S1,S2 noted.  No murmur, rubs or gallops noted. No JVD or BLE edema. No carotid bruits noted. Pulmonary/Chest: Normal effort and positive vesicular breath sounds. No respiratory distress. No wheezes, rales or ronchi noted.  Abdomen: Soft and nontender. Normal bowel sounds. No distention or masses noted. Liver, spleen and kidneys non palpable. Musculoskeletal: Normal range of motion. No signs of joint swelling. No difficulty with gait.  Neurological: Alert and oriented. Cranial nerves II-XII grossly intact. Coordination normal.  Psychiatric: Mood and affect normal. Behavior is normal. Judgment and thought content normal.    BMET    Component Value Date/Time   NA 144 11/17/2020 0835   K 4.1 11/17/2020 0835   CL 108 11/17/2020 0835   CO2 27 11/17/2020 0835   GLUCOSE 80 11/17/2020 0835   BUN 22 11/17/2020 0835   CREATININE 0.77 11/17/2020 0835   CALCIUM 8.5 (L) 11/17/2020 0835   GFRNONAA >60 03/16/2020 1502   GFRNONAA 68 02/17/2018 0840   GFRAA >60 02/14/2019 1434   GFRAA 78 02/17/2018 0840    Lipid Panel     Component Value Date/Time   CHOL 217 (H) 11/17/2020 0835   TRIG 221 (H) 11/17/2020 0835   HDL 57 11/17/2020 0835   CHOLHDL 3.8 11/17/2020 0835   VLDL 51 (H) 07/29/2016 0823   LDLCALC 124 (H) 11/17/2020 0835    CBC    Component Value Date/Time   WBC 5.7 11/17/2020 0835   RBC 4.17 11/17/2020 0835   HGB 12.5 11/17/2020 0835   HCT 38.0 11/17/2020 0835   PLT 222 11/17/2020 0835   MCV 91.1 11/17/2020 0835   MCH 30.0 11/17/2020 0835   MCHC 32.9 11/17/2020 0835   RDW 13.2 11/17/2020 0835   LYMPHSABS 0.6 (L) 03/16/2020 1502   MONOABS 0.1 03/16/2020 1502   EOSABS 0.0 03/16/2020 1502   BASOSABS 0.0 03/16/2020 1502    Hgb A1C Lab Results   Component Value Date   HGBA1C 5.4 07/29/2016           Assessment & Plan:    Nicki Reaper, NP This visit occurred during the SARS-CoV-2 public health emergency.  Safety protocols were in place, including screening questions prior to the visit, additional usage of staff PPE, and extensive cleaning of exam room while observing appropriate contact time as indicated for disinfecting solutions.

## 2020-12-14 ENCOUNTER — Other Ambulatory Visit: Payer: Self-pay | Admitting: Internal Medicine

## 2020-12-14 DIAGNOSIS — I10 Essential (primary) hypertension: Secondary | ICD-10-CM

## 2020-12-14 NOTE — Telephone Encounter (Signed)
Requested Prescriptions  Pending Prescriptions Disp Refills  . amLODipine (NORVASC) 10 MG tablet [Pharmacy Med Name: AMLODIPINE BESYLATE 10 MG TAB] 30 tablet 0    Sig: TAKE 1 TABLET BY MOUTH EVERY DAY     Cardiovascular:  Calcium Channel Blockers Failed - 12/14/2020 12:44 AM      Failed - Last BP in normal range    BP Readings from Last 1 Encounters:  11/17/20 (!) 198/75         Passed - Valid encounter within last 6 months    Recent Outpatient Visits          3 weeks ago Aortic atherosclerosis (HCC)   [facility] Turner, Salvadore Oxford, NP   1 year ago Acute pancreatitis, unspecified complication status, unspecified pancreatitis type   [facility] Smitty Cords, DO   1 year ago Abdominal pain, unspecified abdominal location   [facility] [place], Netta Neat, DO   2 years ago Right hip pain   [facility] Smitty Cords, DO   2 years ago Right hip pain   [facility] Galen Manila, NP

## 2021-02-12 ENCOUNTER — Ambulatory Visit (INDEPENDENT_AMBULATORY_CARE_PROVIDER_SITE_OTHER): Payer: Medicare Other | Admitting: Internal Medicine

## 2021-02-12 ENCOUNTER — Encounter: Payer: Self-pay | Admitting: Internal Medicine

## 2021-02-12 ENCOUNTER — Other Ambulatory Visit: Payer: Self-pay

## 2021-02-12 DIAGNOSIS — I10 Essential (primary) hypertension: Secondary | ICD-10-CM

## 2021-02-12 DIAGNOSIS — N3946 Mixed incontinence: Secondary | ICD-10-CM | POA: Diagnosis not present

## 2021-02-12 MED ORDER — AMLODIPINE BESYLATE 10 MG PO TABS: 10.0000 mg | ORAL_TABLET | Freq: Every day | ORAL | 0 refills | Status: DC

## 2021-02-12 NOTE — Progress Notes (Signed)
Subjective:    Patient ID: Madeline Taylor, female    DOB: Nov 01, 1935, 86 y.o.   MRN: [id number]  HPI  Pt presents to the clinic today with c/o urinary leakage.  She noticed this 3 years ago.  She reports urinary urgency and leakage when she coughs or sneezes.  She denies frequency, dysuria, blood in her urine, lower abdominal pain, back pain, fever, chills or body aches.  She has not taken anything OTC for this.  Of note, her BP today is 190/74.  She was restarted on Amlodipine 10/2020.  She reports she is currently not taking this.  She never returned for her follow-up appointment.  Review of Systems  Past Medical History:  Diagnosis Date   Essential hypertension 07/29/2016   History of 2019 novel coronavirus disease (COVID-19) 01/16/2019   History of vertigo 07/29/2016   Hyperlipidemia 07/29/2016   Osteoarthritis of back 07/29/2016   Osteopenia of multiple sites 07/29/2016   Pancreatitis    Seasonal allergies 07/29/2016   Shingles 05/01/2019   Urinary, incontinence, stress female 07/29/2016    Current Outpatient Medications  Medication Sig Dispense Refill   amLODipine (NORVASC) 10 MG tablet TAKE 1 TABLET BY MOUTH EVERY DAY 30 tablet 0   aspirin 81 MG EC tablet Take 1 tablet (81 mg total) by mouth daily. Swallow whole. 30 tablet 12   atorvastatin (LIPITOR) 10 MG tablet Take 1 tablet (10 mg total) by mouth daily. 30 tablet 2   No current facility-administered medications for this visit.    Allergies  Allergen Reactions   Lisinopril Cough   Statins     Family History  Problem Relation Age of Onset   Breast cancer Sister    Cancer Sister 54       breast cancer   Alzheimer's disease Mother    Healthy Father     Social History   Socioeconomic History   Marital status: Married    Spouse name: Not on file   Number of children: Not on file   Years of education: Not on file   Highest education level: 12th grade  Occupational History   Not on file  Tobacco Use   Smoking  status: Never   Smokeless tobacco: Never  Vaping Use   Vaping Use: Never used  Substance and Sexual Activity   Alcohol use: No   Drug use: No   Sexual activity: Not Currently    Birth control/protection: None, Post-menopausal  Other Topics Concern   Not on file  Social History Narrative   Not on file   Social Determinants of Health   Financial Resource Strain: Not on file  Food Insecurity: Not on file  Transportation Needs: Not on file  Physical Activity: Not on file  Stress: Not on file  Social Connections: Not on file  Intimate Partner Violence: Not on file     Constitutional: Denies fever, malaise, fatigue, headache or abrupt weight changes.  Respiratory: Denies difficulty breathing, shortness of breath, cough or sputum production.   Cardiovascular: Denies chest pain, chest tightness, palpitations or swelling in the hands or feet.  Gastrointestinal: Denies abdominal pain, bloating, constipation, diarrhea or blood in the stool.  GU: Pt reports urinary leakage, urgency. Denies frequency, pain with urination, burning sensation, blood in urine, odor or discharge. Neurological: Denies dizziness, difficulty with memory, difficulty with speech or problems with balance and coordination.    No other specific complaints in a complete review of systems (except as listed in HPI above).  Objective:   Physical Exam  BP (!) 190/74 (BP Location: Right Arm, Patient Position: Sitting, Cuff Size: Normal)    Pulse 73    Temp (!) 97.3 F (36.3 C) (Temporal)    Resp 18    Ht 5\' 2"  (1.575 m)    Wt 113 lb (51.3 kg)    BMI 20.67 kg/m   Wt Readings from Last 3 Encounters:  11/17/20 110 lb 12.8 oz (50.3 kg)  03/16/20 114 lb 3.2 oz (51.8 kg)  05/01/19 76 lb 0.9 oz (34.5 kg)    General: Appears her stated age, well developed, well nourished in NAD. Cardiovascular: Normal rate and rhythm. S1,S2 noted.  No murmur, rubs or gallops noted. No JVD or BLE edema.  Pulmonary/Chest: Normal effort  and positive vesicular breath sounds. No respiratory distress. No wheezes, rales or ronchi noted.  Abdomen: Soft and nontender. Normal bowel sounds.  Musculoskeletal: No difficulty with gait.  Neurological: Alert and oriented.  BMET    Component Value Date/Time   NA 144 11/17/2020 0835   K 4.1 11/17/2020 0835   CL 108 11/17/2020 0835   CO2 27 11/17/2020 0835   GLUCOSE 80 11/17/2020 0835   BUN 22 11/17/2020 0835   CREATININE 0.77 11/17/2020 0835   CALCIUM 8.5 (L) 11/17/2020 0835   GFRNONAA >60 03/16/2020 1502   GFRNONAA 68 02/17/2018 0840   GFRAA >60 02/14/2019 1434   GFRAA 78 02/17/2018 0840    Lipid Panel     Component Value Date/Time   CHOL 217 (H) 11/17/2020 0835   TRIG 221 (H) 11/17/2020 0835   HDL 57 11/17/2020 0835   CHOLHDL 3.8 11/17/2020 0835   VLDL 51 (H) 07/29/2016 0823   LDLCALC 124 (H) 11/17/2020 0835    CBC    Component Value Date/Time   WBC 5.7 11/17/2020 0835   RBC 4.17 11/17/2020 0835   HGB 12.5 11/17/2020 0835   HCT 38.0 11/17/2020 0835   PLT 222 11/17/2020 0835   MCV 91.1 11/17/2020 0835   MCH 30.0 11/17/2020 0835   MCHC 32.9 11/17/2020 0835   RDW 13.2 11/17/2020 0835   LYMPHSABS 0.6 (L) 03/16/2020 1502   MONOABS 0.1 03/16/2020 1502   EOSABS 0.0 03/16/2020 1502   BASOSABS 0.0 03/16/2020 1502    Hgb A1C Lab Results  Component Value Date   HGBA1C 5.4 07/29/2016            Assessment & Plan:    09/29/2016, NP This visit occurred during the SARS-CoV-2 public health emergency.  Safety protocols were in place, including screening questions prior to the visit, additional usage of staff PPE, and extensive cleaning of exam room while observing appropriate contact time as indicated for disinfecting solutions.

## 2021-02-12 NOTE — Assessment & Plan Note (Signed)
Uncontrolled off medication Discussed the importance of taking her Amlodipine daily to reduce the risk of heart attack, stroke and death Reinforced DASH diet  RTC in 2 weeks for BP follow-up

## 2021-02-12 NOTE — Assessment & Plan Note (Signed)
Discussed using an anticholinergic medication to help reduce the symptoms however she is completely resistant to taking medications on a daily basis Encouraged timed voiding use pads or depends as needed

## 2021-02-12 NOTE — Patient Instructions (Signed)
Urinary Incontinence °Urinary incontinence refers to a condition in which a person is unable to control where and when to pass urine. A person with this condition will urinate involuntarily. This means that the person urinates when he or she does not mean to. °What are the causes? °This condition may be caused by: °Medicines. °Infections. °Constipation. °Overactive bladder muscles. °Weak bladder muscles. °Weak pelvic floor muscles. These muscles provide support for the bladder, intestine, and, in women, the uterus. °Enlarged prostate in men. The prostate is a gland near the bladder. When it gets too big, it can pinch the urethra. With the urethra blocked, the bladder can weaken and lose the ability to empty properly. °Surgery. °Emotional factors, such as anxiety, stress, or post-traumatic stress disorder (PTSD). °Spinal cord injury, nerve injury, or other neurological conditions. °Pelvic organ prolapse. This happens in women when organs move out of place and into the vagina. This movement can prevent the bladder and urethra from working properly. °What increases the risk? °The following factors may make you more likely to develop this condition: °Age. The older you are, the higher the risk. °Obesity. °Being physically inactive. °Pregnancy and childbirth. °Menopause. °Diseases that affect the nerves or spinal cord. °Long-term, or chronic, coughing. This can increase pressure on the bladder and pelvic floor muscles. °What are the signs or symptoms? °Symptoms may vary depending on the type of urinary incontinence you have. They include: °A sudden urge to urinate, and passing urine involuntarily before you can get to a bathroom (urge incontinence). °Suddenly passing urine when doing activities that force urine to pass, such as coughing, laughing, exercising, or sneezing (stress incontinence). °Needing to urinate often but urinating only a small amount, or constantly dribbling urine (overflow incontinence). °Urinating  because you cannot get to the bathroom in time due to a physical disability, such as arthritis or injury, or due to a communication or thinking problem, such as Alzheimer's disease (functional incontinence). °How is this diagnosed? °This condition may be diagnosed based on: °Your medical history. °A physical exam. °Tests, such as: °Urine tests. °X-rays of your kidney and bladder. °Ultrasound. °CT scan. °Cystoscopy. In this procedure, a health care provider inserts a tube with a light and camera (cystoscope) through the urethra and into the bladder to check for problems. °Urodynamic testing. These tests assess how well the bladder, urethra, and sphincter can store and release urine. There are different types of urodynamic tests, and they vary depending on what the test is measuring. °To help diagnose your condition, your health care provider may recommend that you keep a log of when you urinate and how much you urinate. °How is this treated? °Treatment for this condition depends on the type of incontinence that you have and its cause. Treatment may include: °Lifestyle changes, such as: °Quitting smoking. °Maintaining a healthy weight. °Staying active. Try to get 150 minutes of moderate-intensity exercise every week. Ask your health care provider which activities are safe for you. °Eating a healthy diet. °Avoid high-fat foods, like fried foods. °Avoid refined carbohydrates like white bread and white rice. °Limit how much alcohol and caffeine you drink. °Increase your fiber intake. Healthy sources of fiber include beans, whole grains, and fresh fruits and vegetables. °Behavioral changes, such as: °Pelvic floor muscle exercises. °Bladder training, such as lengthening the amount of time between bathroom breaks, or using the bathroom at regular intervals. °Using techniques to suppress bladder urges. This can include distraction techniques or controlled breathing exercises. °Medicines, such as: °Medicines to relax the  bladder   muscles and prevent bladder spasms. °Medicines to help slow or prevent the growth of a man's prostate. °Botox injections. These can help relax the bladder muscles. °Treatments, such as: °Using pulses of electricity to help change bladder reflexes (electrical nerve stimulation). °For women, using a medical device to prevent urine leaks. This is a small, tampon-like, disposable device that is inserted into the urethra. °Injecting collagen or carbon beads (bulking agents) into the urinary sphincter. These can help thicken tissue and close the bladder opening. °Surgery. °Follow these instructions at home: °Lifestyle °Limit alcohol and caffeine. These can fill your bladder quickly and irritate it. °Keep yourself clean to help prevent odors and skin damage. Ask your health care provider about special skin creams and cleansers that can protect the skin from urine. °Consider wearing pads or adult diapers. Make sure to change them regularly, and always change them right after experiencing incontinence. °General instructions °Take over-the-counter and prescription medicines only as told by your health care provider. °Use the bathroom about every 3-4 hours, even if you do not feel the need to urinate. Try to empty your bladder completely every time. After urinating, wait a minute. Then try to urinate again. °Make sure you are in a relaxed position while urinating. °If your incontinence is caused by nerve problems, keep a log of the medicines you take and the times you go to the bathroom. °Keep all follow-up visits. This is important. °Where to find more information °National Institute of Diabetes and Digestive and Kidney Diseases: www.niddk.nih.gov °American U[employer] Association: www.urologyhealth.org °Contact a health care provider if: °You have pain that gets worse. °Your incontinence gets worse. °Get help right away if: °You have a fever or chills. °You are unable to urinate. °You have redness in your groin area or  down your legs. °Summary °Urinary incontinence refers to a condition in which a person is unable to control where and when to pass urine. °This condition may be caused by medicines, infection, weak bladder muscles, weak pelvic floor muscles, enlargement of the prostate (in men), or surgery. °Factors such as older age, obesity, pregnancy and childbirth, menopause, neurological diseases, and chronic coughing may increase your risk for developing this condition. °Types of urinary incontinence include urge incontinence, stress incontinence, overflow incontinence, and functional incontinence. °This condition is usually treated first with lifestyle and behavioral changes, such as quitting smoking, eating a healthier diet, and doing regular pelvic floor exercises. Other treatment options include medicines, bulking agents, medical devices, electrical nerve stimulation, or surgery. °This information is not intended to replace advice given to you by your health care provider. Make sure you discuss any questions you have with your health care provider. °Document Revised: 08/10/2019 Document Reviewed: 08/10/2019 °Elsevier Patient Education © 2022 [employer] ° °

## 2021-04-06 ENCOUNTER — Other Ambulatory Visit: Payer: Self-pay

## 2021-04-06 ENCOUNTER — Ambulatory Visit
Admission: EM | Admit: 2021-04-06 | Discharge: 2021-04-06 | Disposition: A | Discharge status: Home / Self Care | Payer: Medicare Other

## 2021-05-21 ENCOUNTER — Ambulatory Visit (INDEPENDENT_AMBULATORY_CARE_PROVIDER_SITE_OTHER): Payer: Medicare Other

## 2021-05-21 VITALS — BP 152/56 | HR 69 | Temp 98.0°F | Resp 17 | Ht <= 58 in | Wt 108.2 lb

## 2021-05-21 DIAGNOSIS — Z0000 Encounter for general adult medical examination without abnormal findings: Secondary | ICD-10-CM

## 2021-05-21 NOTE — Progress Notes (Signed)
? ?Subjective:  ? Madeline Taylor is a 86 y.o. female who presents for Medicare Annual (Subsequent) preventive examination. ? ?Review of Systems    ?Per HPI unless specifically indicated below  ?  ? ?   ?Objective:  ?  ?Today's Vitals  ? 05/21/21 [id number] 05/21/21 0810  ?BP: (!) 158/58   ?Pulse: 69   ?Resp: 17   ?Temp: 98 ?F (36.7 ?C)   ?TempSrc: Temporal   ?SpO2: 100%   ?Weight: 108 lb 3.2 oz (49.1 kg)   ?Height: 4' 6.5" (1.384 m)   ?PainSc: 2  2   ? ?Body mass index is 25.61 kg/m?. ? ? ?  03/16/2020  ?  2:59 PM 02/14/2019  ?  2:32 PM 01/16/2019  ?  4:33 PM 01/16/2019  ? 12:11 PM 01/01/2019  ?  1:00 PM 12/31/2018  ? 11:18 AM 12/29/2018  ?  8:49 PM  ?Advanced Directives  ?Does Patient Have a Medical Advance Directive? Unable to assess, patient is non-responsive or altered mental status No No No No No No  ?Would patient like information on creating a medical advance directive?  No - Patient declined No - Patient declined No - Patient declined No - Patient declined No - Patient declined   ? ? ?Current Medications (verified) ?Outpatient Encounter Medications as of 05/21/2021  ?Medication Sig  ? amLODipine (NORVASC) 10 MG tablet Take 1 tablet (10 mg total) by mouth daily. (Patient not taking: Reported on 05/21/2021)  ? aspirin 81 MG EC tablet Take 1 tablet (81 mg total) by mouth daily. Swallow whole. (Patient not taking: Reported on 05/21/2021)  ? atorvastatin (LIPITOR) 10 MG tablet Take 1 tablet (10 mg total) by mouth daily. (Patient not taking: Reported on 05/21/2021)  ? [DISCONTINUED] metoprolol tartrate (LOPRESSOR) 25 MG tablet Take 1 tablet (25 mg total) by mouth 2 (two) times daily. (Patient not taking: Reported on 01/24/2019)  ? ?No facility-administered encounter medications on file as of 05/21/2021.  ? ? ?Allergies (verified) ?Lisinopril and Statins  ? ?History: ?Past Medical History:  ?Diagnosis Date  ? Essential hypertension 07/29/2016  ? History of 2019 novel coronavirus disease (COVID-19) 01/16/2019  ? History of vertigo  07/29/2016  ? Hyperlipidemia 07/29/2016  ? Osteoarthritis of back 07/29/2016  ? Osteopenia of multiple sites 07/29/2016  ? Pancreatitis   ? Seasonal allergies 07/29/2016  ? Shingles 05/01/2019  ? Urinary, incontinence, stress female 07/29/2016  ? ?Past Surgical History:  ?Procedure Laterality Date  ? CHOLECYSTECTOMY    ? no past surgery    ? ?Family History  ?Problem Relation Age of Onset  ? Breast cancer Sister   ? Cancer Sister 34  ?     breast cancer  ? Alzheimer's disease Mother   ? Healthy Father   ? ?Social History  ? ?Socioeconomic History  ? Marital status: Married  ?  Spouse name: Not on file  ? Number of children: Not on file  ? Years of education: Not on file  ? Highest education level: 12th grade  ?Occupational History  ? Not on file  ?Tobacco Use  ? Smoking status: Never  ? Smokeless tobacco: Never  ?Vaping Use  ? Vaping Use: Never used  ?Substance and Sexual Activity  ? Alcohol use: No  ? Drug use: No  ? Sexual activity: Not Currently  ?  Birth control/protection: None, Post-menopausal  ?Other Topics Concern  ? Not on file  ?Social History Narrative  ? Not on file  ? ?Social Determinants of Health  ? ?  Financial Resource Strain: Not on file  ?Food Insecurity: Not on file  ?Transportation Needs: Not on file  ?Physical Activity: Not on file  ?Stress: Not on file  ?Social Connections: Not on file  ? ? ?Tobacco Counseling ?Counseling given: Not Answered ? ? ?Clinical Intake: ? ?Pre-visit preparation completed: No ? ?Pain : 0-10 ?Pain Score: 2  ?Pain Type: Chronic pain ?Pain Location: Hip ?Pain Orientation: Right ?Pain Descriptors / Indicators: Aching ?Pain Frequency: Occasional ? ?  ? ?Nutritional Status: BMI 25 -29 Overweight ? ?How often do you need to have someone help you when you read instructions, pamphlets, or other written materials from your doctor or pharmacy?: 1 - Never ? ?Diabetic? No  ? ?Interpreter Needed?: No ? ?Information entered by :: Donnie Mesa ? ? ?Activities of Daily Living ? ?  05/21/2021   ?  8:13 AM 11/17/2020  ?  8:08 AM  ?In your present state of health, do you have any difficulty performing the following activities:  ?Hearing? 0 1  ?Vision? 1 0  ?Difficulty concentrating or making decisions? 1 1  ?Walking or climbing stairs? 1 1  ?Dressing or bathing? 0 0  ?Doing errands, shopping? 1 1  ? ? ?Patient Care Team: ?Jearld Fenton, NP as PCP - General (Internal Medicine) ? ?Indicate any recent Medical Services you may have received from other than [facility] providers in the past year (date may be approximate). ?No hospitalization in the past 12 months.  ?   ?Assessment:  ? This is a routine wellness examination for [place]. ? ?Hearing/Vision screen ?No results found. ? ?Dietary issues and exercise activities discussed: ?Current Exercise Habits: The patient does not participate in regular exercise at present, Exercise limited by: None identified ? ? Goals Addressed   ?None ?  ? ?Depression Screen ? ?  11/17/2020  ?  8:08 AM 11/08/2017  ?  1:19 PM 11/04/2016  ?  8:54 AM 07/29/2016  ?  9:38 AM  ?PHQ 2/9 Scores  ?PHQ - 2 Score 0 0 0 0  ?PHQ- 9 Score    0  ?  ?Fall Risk ? ?  05/21/2021  ?  8:12 AM 11/17/2020  ?  8:08 AM 11/08/2017  ?  1:19 PM 11/04/2016  ?  8:54 AM  ?Fall Risk   ?Falls in the past year? 0 0 No No  ?Number falls in past yr: 0 0    ?Injury with Fall? 0 0    ?Risk for fall due to : No Fall Risks No Fall Risks    ?Follow up Falls evaluation completed Falls evaluation completed    ? ? ?FALL RISK PREVENTION PERTAINING TO THE HOME: ? ?Any stairs in or around the home? No  ?If so, are there any without handrails? Yes  ?Home free of loose throw rugs in walkways, pet beds, electrical cords, etc? No  ?Adequate lighting in your home to reduce risk of falls? Yes  ? ?ASSISTIVE DEVICES UTILIZED TO PREVENT FALLS: ? ?Life alert? No  ?Use of a cane, walker or w/c? Yes  ?Grab bars in the bathroom? No  ?Shower chair or bench in shower? No  ?Elevated toilet seat or a handicapped toilet? No  ? ?TIMED UP AND GO: ? ?Was  the test performed? Yes .  ?Length of time to ambulate 10 feet: 10  sec.  ? ?Gait slow and steady without use of assistive device ? ?Cognitive Function: ?  ?  ? ?  05/21/2021  ?  8:13 AM 11/08/2017  ?  1:24 PM  ?6CIT Screen  ?What Year? 4 points 0 points  ?What month? 3 points 0 points  ?What time? 3 points 0 points  ?Count back from 20 4 points 0 points  ?Months in reverse 4 points 0 points  ?Repeat phrase 10 points 2 points  ?Total Score 28 points 2 points  ? ? ?Immunizations ?Immunization History  ?Administered Date(s) Administered  ? Fluad Quad(high Dose 65+) 09/13/2018, 11/17/2020  ? Influenza-Unspecified 11/03/2017  ? Pneumococcal Conjugate-13 10/18/2013  ? Pneumococcal Polysaccharide-23 10/19/2014  ? Tdap 02/03/2012  ? ? ?TDAP status: Up to date ? ?Flu Vaccine status: Up to date ? ?Pneumococcal vaccine status: Up to date ? ?Covid-19 vaccine status: Completed vaccines ? ?Qualifies for Shingles Vaccine? Yes   ?Zostavax completed No   ?Shingrix Completed?: No.    Education has been provided regarding the importance of this vaccine. Patient has been advised to call insurance company to determine out of pocket expense if they have not yet received this vaccine. Advised may also receive vaccine at local pharmacy or Health Dept. Verbalized acceptance and understanding. ? ?Screening Tests ?Health Maintenance  ?Topic Date Due  ? COVID-19 Vaccine (1) Never done  ? Zoster Vaccines- Shingrix (1 of 2) Never done  ? INFLUENZA VACCINE  08/18/2021  ? TETANUS/TDAP  02/02/2022  ? Pneumonia Vaccine 64+ Years old  Completed  ? DEXA SCAN  Completed  ? HPV VACCINES  Aged Out  ? ? ?Health Maintenance ? ?Health Maintenance Due  ?Topic Date Due  ? COVID-19 Vaccine (1) Never done  ? Zoster Vaccines- Shingrix (1 of 2) Never done  ? ? ?Colonoscopy: No longer required  ? ?Mammogram: 08/16/2016: no longer required  ? ?Bone Density : 08/16/2016, no longer required  ? ? ? ?Lung Cancer Screening: (Low Dose CT Chest recommended if Age 6-80  years, 30 pack-year currently smoking OR have quit w/in 15years.) does not qualify.  ? ?Lung Cancer Screening Referral: does not qualify  ? ?Additional Screening: ? ?Hepatitis C Screening: does qualify; Complet

## 2021-05-25 ENCOUNTER — Ambulatory Visit: Payer: Medicare Other | Admitting: Internal Medicine

## 2021-05-25 NOTE — Progress Notes (Deleted)
Subjective:    Patient ID: Madeline Taylor, female    DOB: April 29, 1935, 86 y.o.   MRN: [id number]  HPI  Patient presents to clinic today for follow-up of chronic conditions.  HTN: Her BP today is.  She is noncompliant with Amlodipine or Metoprolol.  ECG from 02/2020 reviewed.  HLD with Aortic Atherosclerosis, History of TIA: Her last LDL was 124, triglycerides 221, 10/2020.  She reports statin intolerance.  She does not consume a low-fat diet.  OA: Multiple joints but she is having more right hip pain.  She takes Bayer Aspirin as needed with good relief of symptoms.  Vascular Dementia: She has difficulty with short-term memory.  She is taking aspirin but reports she is statin intolerant.  She does not consume a low-fat diet.  Osteoporosis: She is not taking any calcium or vitamin D at this time.  She does not get daily weightbearing exercise.  Bone density from 07/2016 reviewed.  Review of Systems     Past Medical History:  Diagnosis Date   Essential hypertension 07/29/2016   History of 2019 novel coronavirus disease (COVID-19) 01/16/2019   History of vertigo 07/29/2016   Hyperlipidemia 07/29/2016   Osteoarthritis of back 07/29/2016   Osteopenia of multiple sites 07/29/2016   Pancreatitis    Seasonal allergies 07/29/2016   Shingles 05/01/2019   Urinary, incontinence, stress female 07/29/2016    Current Outpatient Medications  Medication Sig Dispense Refill   amLODipine (NORVASC) 10 MG tablet Take 1 tablet (10 mg total) by mouth daily. (Patient not taking: Reported on 05/21/2021) 90 tablet 0   aspirin 81 MG EC tablet Take 1 tablet (81 mg total) by mouth daily. Swallow whole. (Patient not taking: Reported on 05/21/2021) 30 tablet 12   atorvastatin (LIPITOR) 10 MG tablet Take 1 tablet (10 mg total) by mouth daily. (Patient not taking: Reported on 05/21/2021) 30 tablet 2   No current facility-administered medications for this visit.    Allergies  Allergen Reactions   Lisinopril Cough    Statins     Family History  Problem Relation Age of Onset   Breast cancer Sister    Cancer Sister [phone]       breast cancer   Alzheimer's disease Mother    Healthy Father     Social History   Socioeconomic History   Marital status: Married    Spouse name: Madeline Taylor   Number of children: 0   Years of education: Not on file   Highest education level: 12th grade  Occupational History   Not on file  Tobacco Use   Smoking status: Never   Smokeless tobacco: Never  Vaping Use   Vaping Use: Never used  Substance and Sexual Activity   Alcohol use: No   Drug use: No   Sexual activity: Not Currently    Birth control/protection: None, Post-menopausal  Other Topics Concern   Not on file  Social History Narrative   Not on file   Social Determinants of Health   Financial Resource Strain: Low Risk    Difficulty of Paying Living Expenses: Not hard at all  Food Insecurity: No Food Insecurity   Worried About Programme researcher, broadcasting/film/videounning Out of Food in the Last Year: Never true   Ran Out of Food in the Last Year: Never true  Transportation Needs: No Transportation Needs   Lack of Transportation (Medical): No   Lack of Transportation (Non-Medical): No  Physical Activity: Inactive   Days of Exercise per Week: 0 days   Minutes  of Exercise per Session: 0 min  Stress: No Stress Concern Present   Feeling of Stress : Not at all  Social Connections: Moderately Integrated   Frequency of Communication with Friends and Family: Never   Frequency of Social Gatherings with Friends and Family: Never   Attends Religious Services: 1 to 4 times per year   Active Member of [employer] or Organizations: No   Attends Engineer, structural: 1 to 4 times per year   Marital Status: Married  Catering manager Violence: Not At Risk   Fear of Current or Ex-Partner: No   Emotionally Abused: No   Physically Abused: No   Sexually Abused: No     Constitutional: Denies fever, malaise, fatigue, headache or abrupt weight  changes.  HEENT: Denies eye pain, eye redness, ear pain, ringing in the ears, wax buildup, runny nose, nasal congestion, bloody nose, or sore throat. Respiratory: Denies difficulty breathing, shortness of breath, cough or sputum production.   Cardiovascular: Denies chest pain, chest tightness, palpitations or swelling in the hands or feet.  Gastrointestinal: Denies abdominal pain, bloating, constipation, diarrhea or blood in the stool.  GU: Denies urgency, frequency, pain with urination, burning sensation, blood in urine, odor or discharge. Musculoskeletal: Patient reports right hip pain.  Denies decrease in range of motion, difficulty with gait, muscle pain or joint swelling.  Skin: Denies redness, rashes, lesions or ulcercations.  Neurological: Patient reports difficulty with memory.  Denies dizziness, difficulty with speech or problems with balance and coordination.  Psych: Denies anxiety, depression, SI/HI.  No other specific complaints in a complete review of systems (except as listed in HPI above).  Objective:   Physical Exam  There were no vitals taken for this visit. Wt Readings from Last 3 Encounters:  05/21/21 108 lb 3.2 oz (49.1 kg)  02/12/21 113 lb (51.3 kg)  11/17/20 110 lb 12.8 oz (50.3 kg)    General: Appears their stated age, well developed, well nourished in NAD. Skin: Warm, dry and intact. No rashes, lesions or ulcerations noted. HEENT: Head: normal shape and size; Eyes: sclera white, no icterus, conjunctiva pink, PERRLA and EOMs intact; Ears: Tm's gray and intact, normal light reflex; Nose: mucosa pink and moist, septum midline; Throat/Mouth: Teeth present, mucosa pink and moist, no exudate, lesions or ulcerations noted.  Neck:  Neck supple, trachea midline. No masses, lumps or thyromegaly present.  Cardiovascular: Normal rate and rhythm. S1,S2 noted.  No murmur, rubs or gallops noted. No JVD or BLE edema. No carotid bruits noted. Pulmonary/Chest: Normal effort and  positive vesicular breath sounds. No respiratory distress. No wheezes, rales or ronchi noted.  Abdomen: Soft and nontender. Normal bowel sounds. No distention or masses noted. Liver, spleen and kidneys non palpable. Musculoskeletal: Normal range of motion. No signs of joint swelling. No difficulty with gait.  Neurological: Alert and oriented. Cranial nerves II-XII grossly intact. Coordination normal.  Psychiatric: Mood and affect normal. Behavior is normal. Judgment and thought content normal.     BMET    Component Value Date/Time   NA 144 11/17/2020 0835   K 4.1 11/17/2020 0835   CL 108 11/17/2020 0835   CO2 27 11/17/2020 0835   GLUCOSE 80 11/17/2020 0835   BUN 22 11/17/2020 0835   CREATININE 0.77 11/17/2020 0835   CALCIUM 8.5 (L) 11/17/2020 0835   GFRNONAA >60 03/16/2020 1502   GFRNONAA 68 02/17/2018 0840   GFRAA >60 02/14/2019 1434   GFRAA 78 02/17/2018 0840    Lipid Panel  Component Value Date/Time   CHOL 217 (H) 11/17/2020 0835   TRIG 221 (H) 11/17/2020 0835   HDL 57 11/17/2020 0835   CHOLHDL 3.8 11/17/2020 0835   VLDL 51 (H) 07/29/2016 0823   LDLCALC 124 (H) 11/17/2020 0835    CBC    Component Value Date/Time   WBC 5.7 11/17/2020 0835   RBC 4.17 11/17/2020 0835   HGB 12.5 11/17/2020 0835   HCT 38.0 11/17/2020 0835   PLT 222 11/17/2020 0835   MCV 91.1 11/17/2020 0835   MCH 30.0 11/17/2020 0835   MCHC 32.9 11/17/2020 0835   RDW 13.2 11/17/2020 0835   LYMPHSABS 0.6 (L) 03/16/2020 1502   MONOABS 0.1 03/16/2020 1502   EOSABS 0.0 03/16/2020 1502   BASOSABS 0.0 03/16/2020 1502    Hgb A1C Lab Results  Component Value Date   HGBA1C 5.4 07/29/2016            Assessment & Plan:    Nicki Reaper, NP

## 2021-10-09 ENCOUNTER — Ambulatory Visit
Admission: EM | Admit: 2021-10-09 | Discharge: 2021-10-09 | Disposition: A | Discharge status: Home / Self Care | Payer: TRICARE For Life (TFL) | Attending: Urgent Care | Admitting: Urgent Care

## 2021-10-09 DIAGNOSIS — M810 Age-related osteoporosis without current pathological fracture: Secondary | ICD-10-CM | POA: Diagnosis not present

## 2021-10-09 DIAGNOSIS — M160 Bilateral primary osteoarthritis of hip: Secondary | ICD-10-CM

## 2021-10-09 MED ORDER — ACETAMINOPHEN 325 MG PO TABS: 650.0000 mg | ORAL_TABLET | Freq: Four times a day (QID) | ORAL | 0 refills | Status: AC | PRN

## 2021-10-09 MED ORDER — PREDNISONE 50 MG PO TABS: 50.0000 mg | ORAL_TABLET | Freq: Every day | ORAL | 0 refills | Status: DC

## 2021-10-09 NOTE — ED Provider Notes (Signed)
[facility]  Note:  This document was prepared using Conservation officer, historic buildings and may include unintentional dictation errors.  MRN: [id number] DOB: [birth year]  Subjective:   Madeline Taylor is a 86 y.o. female presenting for 1 year history of progressively worsening bilateral hip pain, lateral hip pain. Was last seen in March and diagnosed with bilateral hip arthritis. She was advised to use Tylenol, diclofenac gel. Has progressively worsened since then. Does not have an orthopedist.  No fall, trauma, weakness, numbness or tingling.  No current facility-administered medications for this encounter.  Current Outpatient Medications:    amLODipine (NORVASC) 10 MG tablet, Take 1 tablet (10 mg total) by mouth daily. (Patient not taking: Reported on 05/21/2021), Disp: 90 tablet, Rfl: 0   aspirin 81 MG EC tablet, Take 1 tablet (81 mg total) by mouth daily. Swallow whole. (Patient not taking: Reported on 05/21/2021), Disp: 30 tablet, Rfl: 12   atorvastatin (LIPITOR) 10 MG tablet, Take 1 tablet (10 mg total) by mouth daily. (Patient not taking: Reported on 05/21/2021), Disp: 30 tablet, Rfl: 2   Allergies  Allergen Reactions   Lisinopril Cough   Statins     Past Medical History:  Diagnosis Date   Essential hypertension 07/29/2016   History of 2019 novel coronavirus disease (COVID-19) 01/16/2019   History of vertigo 07/29/2016   Hyperlipidemia 07/29/2016   Osteoarthritis of back 07/29/2016   Osteopenia of multiple sites 07/29/2016   Pancreatitis    Seasonal allergies 07/29/2016   Shingles 05/01/2019   Urinary, incontinence, stress female 07/29/2016     Past Surgical History:  Procedure Laterality Date   CHOLECYSTECTOMY     no past surgery      Family History  Problem Relation Age of Onset   Breast cancer Sister    Cancer Sister 82       breast cancer   Alzheimer's disease Mother    Healthy Father     Social History   Tobacco Use   Smoking status: Never   Smokeless  tobacco: Never  Vaping Use   Vaping Use: Never used  Substance Use Topics   Alcohol use: No   Drug use: No    ROS   Objective:   Vitals: BP (!) 108/93   Pulse 71   Temp 98.7 F (37.1 C)   Resp 18   Ht 4\' 11"  (1.499 m)   Wt 100 lb (45.4 kg)   SpO2 96%   BMI 20.20 kg/m   Physical Exam Constitutional:      General: She is not in acute distress.    Appearance: Normal appearance. She is well-developed. She is not ill-appearing, toxic-appearing or diaphoretic.  HENT:     Head: Normocephalic and atraumatic.     Nose: Nose normal.     Mouth/Throat:     Mouth: Mucous membranes are moist.  Eyes:     General: No scleral icterus.       Right eye: No discharge.        Left eye: No discharge.     Extraocular Movements: Extraocular movements intact.  Cardiovascular:     Rate and Rhythm: Normal rate.  Pulmonary:     Effort: Pulmonary effort is normal.  Musculoskeletal:     Right hip: Tenderness and bony tenderness present. No deformity, lacerations or crepitus. Decreased range of motion. Normal strength.     Left hip: Tenderness and bony tenderness present. No deformity, lacerations or crepitus. Decreased range of motion. Normal strength.  Legs:     Comments: Ambulates without assistance at a slower pace favoring the hips.  Tenderness noted over areas outlined.  Skin:    General: Skin is warm and dry.  Neurological:     General: No focal deficit present.     Mental Status: She is alert and oriented to person, place, and time.  Psychiatric:        Mood and Affect: Mood normal.        Behavior: Behavior normal.        Thought Content: Thought content normal.        Judgment: Judgment normal.       XR hip left 2 or 3 views (04/06/2021 4:35 PM EDT) Imaging Results - XR hip left 2 or 3 views (04/06/2021 4:35 PM EDT) Anatomical Region Laterality Modality  Lower Extremities, Hip Left Radiographic Imaging   Imaging Results - XR hip left 2 or 3 views (04/06/2021 4:35  PM EDT) Specimen (Source) Anatomical Location / Laterality Collection Method / Volume Collection Time Received Time        04/06/2021 4:35 PM EDT     Imaging Results - XR hip left 2 or 3 views (04/06/2021 4:35 PM EDT) Impressions  04/06/2021 4:35 PM EDT    1. Mild degenerative changes right hip 2. Osteoporosis  Electronically signed by: Lynda Rainwater, M. D. on 04/06/2021 16:35:59     Imaging Results - XR hip left 2 or 3 views (04/06/2021 4:35 PM EDT) Narrative  04/06/2021 4:35 PM EDT   Examination AP and Lateral Xray views of the left hip  Comparison None provided.    Findings There is no significant soft tissue swelling appreciated  There is no evidence for acute fracture.  Mild degenerative changes right hip  Osteoporosis      Imaging Results - XR hip left 2 or 3 views (04/06/2021 4:35 PM EDT) Procedure Note  Reading, Radiology - 04/06/2021   Formatting of this note might be different from the original. Examination AP and Lateral Xray views of the left hip  Comparison None provided.   Findings There is no significant soft tissue swelling appreciated  There is no evidence for acute fracture.  Mild degenerative changes right hip  Osteoporosis  IMPRESSION:  1. Mild degenerative changes right hip 2. Osteoporosis  Electronically signed by: Lynda Rainwater, M. D. on 04/06/2021 16:35:59   Imaging Results - XR hip left 2 or 3 views (04/06/2021 4:35 PM EDT) Authorizing Provider Result Type  Roddie Mc PA IMG XR PROCEDURES   Back to top of Imaging Results    XR hip right 2 or 3 views (04/06/2021 4:35 PM EDT) Imaging Results - XR hip right 2 or 3 views (04/06/2021 4:35 PM EDT) Anatomical Region Laterality Modality  Lower Extremities, Hip Right Radiographic Imaging   Imaging Results - XR hip right 2 or 3 views (04/06/2021 4:35 PM EDT) Specimen (Source) Anatomical Location / Laterality Collection Method / Volume Collection Time Received Time         04/06/2021 4:35 PM EDT     Imaging Results - XR hip right 2 or 3 views (04/06/2021 4:35 PM EDT) Impressions  04/06/2021 4:35 PM EDT    Mild degenerative changes left hip  Electronically signed by: Lynda Rainwater, M. D. on 04/06/2021 16:35:07     Imaging Results - XR hip right 2 or 3 views (04/06/2021 4:35 PM EDT) Narrative  04/06/2021 4:35 PM EDT   Examination AP and Lateral Xray views of  the left hip Comparison None provided.    Findings There is no significant soft tissue swelling appreciated  There is no evidence for acute fracture.  Mild degenerative changes left hip      Imaging Results - XR hip right 2 or 3 views (04/06/2021 4:35 PM EDT) Procedure Note  Reading, Radiology - 04/06/2021   Formatting of this note might be different from the original. Examination AP and Lateral Xray views of the left hip Comparison None provided.   Findings There is no significant soft tissue swelling appreciated  There is no evidence for acute fracture.  Mild degenerative changes left hip  IMPRESSION:  Mild degenerative changes left hip  Electronically signed by: Guadalupe Maple, M. D. on 04/06/2021 16:35:07   Imaging Results - XR hip right 2 or 3 views (04/06/2021 4:35 PM EDT) Authorizing Provider Result Type  Randell Loop PA IMG XR PROCEDURES    Assessment and Plan :   PDMP not reviewed this encounter.  1. Bilateral hip joint arthritis   2. Osteoporosis, unspecified osteoporosis type, unspecified pathological fracture presence    Recommended an oral prednisone course.  Start Tylenol and continue diclofenac gel as needed.  Follow-up with an orthopedist soon as possible.  Deferred imaging as it was already done and there is no trauma and therefore low suspicion for fracture or dislocation. Counseled patient on potential for adverse effects with medications prescribed/recommended today, ER and return-to-clinic precautions discussed, patient verbalized  understanding.     Wallis Bamberg, PA-C 10/09/21 1101

## 2021-10-09 NOTE — ED Triage Notes (Signed)
Patient to Urgent Care with complaints of bilateral hip pain. Reports both hips have hurt over the last year but the last three months the pain has worsened and she now has difficulty getting out of bed and ambulating. Denies any known injury. Describes pain as aching, non-radiating.  Patient has previously been seen for the problem, states she was prescribed a medication that did not help (unsure of name). Reports currently using diclofenac sodium 1% gel.

## 2021-10-09 NOTE — Discharge Instructions (Addendum)
I am starting you on an oral prednisone course for 5 days to help you with your bilateral hip pain likely due to your arthritis.  You can start using Tylenol on a regular basis as needed for your hip pain.  It is okay to use diclofenac gel together with this.  Make sure you contact [facility] for consultation and further management.

## 2022-02-08 ENCOUNTER — Ambulatory Visit (INDEPENDENT_AMBULATORY_CARE_PROVIDER_SITE_OTHER): Payer: Medicare Other

## 2022-02-08 DIAGNOSIS — Z23 Encounter for immunization: Secondary | ICD-10-CM | POA: Diagnosis not present

## 2022-03-29 ENCOUNTER — Ambulatory Visit (INDEPENDENT_AMBULATORY_CARE_PROVIDER_SITE_OTHER): Payer: Medicare Other | Admitting: Internal Medicine

## 2022-03-29 ENCOUNTER — Encounter: Payer: Self-pay | Admitting: Internal Medicine

## 2022-03-29 VITALS — BP 174/88 | HR 75 | Temp 96.8°F | Wt 109.0 lb

## 2022-03-29 DIAGNOSIS — E782 Mixed hyperlipidemia: Secondary | ICD-10-CM | POA: Diagnosis not present

## 2022-03-29 DIAGNOSIS — M810 Age-related osteoporosis without current pathological fracture: Secondary | ICD-10-CM

## 2022-03-29 DIAGNOSIS — N3946 Mixed incontinence: Secondary | ICD-10-CM

## 2022-03-29 DIAGNOSIS — Z8673 Personal history of transient ischemic attack (TIA), and cerebral infarction without residual deficits: Secondary | ICD-10-CM

## 2022-03-29 DIAGNOSIS — I700 Atherosclerosis of aorta: Secondary | ICD-10-CM | POA: Diagnosis not present

## 2022-03-29 DIAGNOSIS — R7309 Other abnormal glucose: Secondary | ICD-10-CM

## 2022-03-29 DIAGNOSIS — M159 Polyosteoarthritis, unspecified: Secondary | ICD-10-CM

## 2022-03-29 DIAGNOSIS — Z0289 Encounter for other administrative examinations: Secondary | ICD-10-CM

## 2022-03-29 DIAGNOSIS — F01A0 Vascular dementia, mild, without behavioral disturbance, psychotic disturbance, mood disturbance, and anxiety: Secondary | ICD-10-CM | POA: Diagnosis not present

## 2022-03-29 DIAGNOSIS — I10 Essential (primary) hypertension: Secondary | ICD-10-CM | POA: Diagnosis not present

## 2022-03-29 MED ORDER — AMLODIPINE-OLMESARTAN 10-40 MG PO TABS: 1.0000 | ORAL_TABLET | Freq: Every day | ORAL | 1 refills | Status: AC

## 2022-03-29 NOTE — Assessment & Plan Note (Signed)
C-Met and lipid profile today Encouraged her to consume low-fat diet Advised her to start aspirin daily

## 2022-03-29 NOTE — Assessment & Plan Note (Signed)
C-Met and lipid profile today °Encouraged her to consume a low-fat diet °

## 2022-03-29 NOTE — Assessment & Plan Note (Signed)
C-Met and lipid profile today Discussed the importance of good blood pressure and cholesterol control We will start amlodipine-olmesartan 10-40 mg daily Reinforced DASH, low-fat diet

## 2022-03-29 NOTE — Assessment & Plan Note (Signed)
Uncontrolled We will start amlodipine-olmesartan 10-40 mg daily Reinforced DASH diet and exercise for weight loss C-Met today

## 2022-03-29 NOTE — Assessment & Plan Note (Signed)
Encourage calcium and vitamin D

## 2022-03-29 NOTE — Assessment & Plan Note (Signed)
Continue Tylenol arthritis 3 times daily

## 2022-03-29 NOTE — Patient Instructions (Signed)
Vascular Dementia Dementia is a condition in which a person has problems with thinking, memory, and behavior that are severe enough to interfere with daily life. Vascular dementia is a type of dementia. It results from brain damage that is caused by the brain not getting enough blood. This condition may also be called vascular cognitive impairment. What are the causes? Vascular dementia is caused by conditions that reduce blood flow to the brain. Common causes of this condition include: Multiple small strokes. These may happen without symptoms (silent stroke). Major stroke. Damage to small blood vessels in the brain (cerebral small vessel disease). What increases the risk? The following factors may make you more likely to develop this condition: Having had a stroke. Having high blood pressure (hypertension) or high cholesterol. Having a disease that affects the heart or blood vessels. Smoking. Not being active. Being over age 65. Having any of these conditions: Diabetes. Metabolic syndrome. Obesity. Depression. A genetic condition that leads to stroke, such as CADASIL (cerebral autosomal dominant arteriopathy with subcortical infarcts and leukoencephalopathy). What are the signs or symptoms? Symptoms of vascular dementia can vary from one person to another. Symptoms may be mild or severe depending on the amount of damage and which parts of the brain have been affected. Symptoms may begin suddenly or may develop slowly. Mental symptoms may include: Confusion and memory problems. Poor attention and concentration. Trouble understanding speech. Depression. Personality changes. Trouble recognizing familiar people. Agitation or aggression. Paranoia or false beliefs (delusions). Hallucinations. These involve seeing, hearing, tasting, smelling, or feeling things that are not real. Physical symptoms may include: Weakness. Poor balance. Loss of bladder or bowel control  (incontinence). Unsteady walking (gait). Speaking problems. Behavioral symptoms may include: Getting lost in familiar places. Problems with planning and judgment. Trouble following instructions. Social problems. Emotional outbursts. Trouble with daily activities and self-care. Problems handling money. Symptoms may remain stable, or they may get worse over time. Symptoms of vascular dementia may be similar to those of Alzheimer's disease. The two conditions can occur together (mixed dementia). How is this diagnosed? This condition may be diagnosed based on: Your medical history and a physical exam. Symptoms or changes that are reported by friends and family. Lab tests or other tests that check brain and nervous system function. Tests may include: Blood tests. Brain imaging tests. Tests of movement, speech, and other daily activities (neurological exam). Tests of memory, thinking, and problem-solving (neuropsychological or neurocognitive testing). There is not a specific test to diagnose vascular dementia. Diagnosis may involve several specialists. These may include: A health care provider who specializes in the brain and nervous system (neurologist). A health care provider who specializes in understanding how problems in the brain can alter behavior and cognitive function (neuropsychologist). How is this treated? There is no cure for vascular dementia. Brain damage that has already occurred cannot be reversed. Treatment depends on: How severe the condition is. Which parts of your brain have been affected. Your overall health. Treatment measures aim to: Treat the underlying cause of vascular dementia and manage risk factors. This may include: Controlling blood pressure or lowering cholesterol. Treating diabetes. Making lifestyle changes, such as quitting smoking or losing weight. Manage symptoms. Prevent further brain damage. Improve the person's health and quality of  life. Treatment for dementia may involve a team of health care providers, including: A neurologist. A provider who specializes in disorders of the mind (psychiatrist). A provider who specializes in helping people learn daily living skills (occupational therapist). A provider who focuses   on speech and language changes (speech pathologist). A heart specialist (cardiologist). A provider who helps people learn how to manage physical changes, such as movement and walking (exercise physiologist or physical therapist). Follow these instructions at home: Medicines Take over-the-counter and prescription medicines only as told by your health care provider. Use a pill organizer or pill reminder to help you manage your medicines. Lifestyle Do not use any products that contain nicotine or tobacco, such as cigarettes, e-cigarettes, and chewing tobacco. If you need help quitting, ask your health care provider. Eat a healthy, balanced diet. Maintain a healthy weight, or lose weight if needed. Be physically active as told by your health care provider. General instructions Work with your health care provider to determine what you need help with and what your safety needs are. Follow the health care provider's instructions for treating the condition that caused the dementia. Keep all follow-up visits. This is important. If you are the caregiver:  People with vascular dementia may need regular help at home or daily care from a family member or home health care worker. Home care for a person with vascular dementia depends on what caused the condition and how severe the symptoms are. General guidelines for caregivers include: Help the person with dementia remember people, appointments, and daily activities. Help the person with dementia manage his or her medicines. Help family and friends learn about ways to communicate with the person with dementia. Create a safe living space to reduce the risk of injury or  falls. Find a support group to help caregivers and family cope with the effects of dementia.  Where to find more information National Institute of Neurological Disorders and Stroke: www.ninds.nih.gov Contact a health care provider if: New behavioral problems develop. Problems with swallowing develop. Confusion gets worse. Sleepiness gets worse. Get help right away if: Loss of consciousness occurs. There is a sudden loss of speech, balance, or thinking ability. New numbness or paralysis occurs. Sudden, severe headache occurs. Vision is lost or suddenly gets worse in one or both eyes. If you ever feel like the person with dementia may hurt himself or herself or others, or if he or she shares thoughts about taking his or her own life, get help right away. You can go to your nearest emergency department or: Call your local emergency services (911 in the [place] Call a suicide crisis helpline, such as the National Suicide Prevention Lifeline at 1-800-273-8255 or 988 in the [place] This is open 24 hours a day in the [place] Text the Crisis Text Line at 741741 (in the [place] Summary Vascular dementia is a type of dementia. It results from brain damage that is caused by the brain not getting enough blood. Vascular dementia is caused by conditions that reduce blood flow to the brain. Common causes of this condition include stroke and damage to small blood vessels in the brain. Treatment focuses on treating the underlying cause of vascular dementia and managing any risk factors. People with vascular dementia may need regular help at home or daily care from a family member or home health care worker. Contact a health care provider if you or your caregiver notices any new symptoms. This information is not intended to replace advice given to you by your health care provider. Make sure you discuss any questions you have with your health care provider. Document Revised: 07/30/2020 Document Reviewed:  05/21/2019 Elsevier Patient Education  2023 Elsevier Inc.   

## 2022-03-29 NOTE — Assessment & Plan Note (Signed)
With behavioral disturbance Discussed the importance of good blood pressure and cholesterol control We will start amlodipine-olmesartan 10-40 mg daily Reinforced-, low-fat diet

## 2022-03-29 NOTE — Assessment & Plan Note (Signed)
Use depends as needed

## 2022-03-29 NOTE — Progress Notes (Signed)
Subjective:    Patient ID: Madeline Taylor, female    DOB: [birth year], 87 y.o.   MRN: [id number]  HPI  Patient presents to clinic today for follow-up of chronic conditions.  HTN: Her BP today is 186/90.  She is prescribed Amlodipine but is not taking this medication.  ECG from 02/2020 reviewed.  HLD with Aortic Atherosclerosis, History of TIA: Her last LDL was 124, triglycerides 221, 10/2020.  She is not taking Atorvastatin as prescribed.  She takes Aspirin only as needed.  She does not consume a low-fat diet.  OA: Generalized.  She takes Tylenol as needed with some relief of symptoms.  She does not follow with orthopedics.  Vascular Dementia: She has difficulty with short-term memory, caring for herself.  She is unable to stay home unsupervised, having delusions and hallucinations.  She is able to fix herself some tea however her daughter is concerned that she would likely leave the of an eye on.  She is unable to fix herself any food.  Her daughter reports she found a bunch of rotten, out of date food in the fridge.  She is unable to manage her medications.  She is needs assistance with bathing, dressing and toileting.  She has been incontinent of urine but continent of bowel.  Her daughter reports she struggles with sundowning or insomnia.  She has been trying to give her melatonin which has not seem to be effective.  She is not currently taking any medications for this.  MRI brain from 02/2020 reviewed.  She does not follow with neurology.  Osteoporosis: She is not taking any Calcium or Vitamin D at this time.  She does not get weightbearing exercise.  Bone density from 07/2016 reviewed.  Her daughter has a form that needs to be completed for the [facility] daycare so that she can be supervised while her husband is in the hospital recovering from ankle surgery.  Review of Systems     Past Medical History:  Diagnosis Date   Essential hypertension 07/29/2016   History of 2019 novel coronavirus  disease (COVID-19) 01/16/2019   History of vertigo 07/29/2016   Hyperlipidemia 07/29/2016   Osteoarthritis of back 07/29/2016   Osteopenia of multiple sites 07/29/2016   Pancreatitis    Seasonal allergies 07/29/2016   Shingles 05/01/2019   Urinary, incontinence, stress female 07/29/2016    Current Outpatient Medications  Medication Sig Dispense Refill   acetaminophen (TYLENOL) 325 MG tablet Take 2 tablets (650 mg total) by mouth every 6 (six) hours as needed for moderate pain. 30 tablet 0   amLODipine (NORVASC) 10 MG tablet Take 1 tablet (10 mg total) by mouth daily. (Patient not taking: Reported on 05/21/2021) 90 tablet 0   aspirin 81 MG EC tablet Take 1 tablet (81 mg total) by mouth daily. Swallow whole. (Patient not taking: Reported on 05/21/2021) 30 tablet 12   atorvastatin (LIPITOR) 10 MG tablet Take 1 tablet (10 mg total) by mouth daily. (Patient not taking: Reported on 05/21/2021) 30 tablet 2   predniSONE (DELTASONE) 50 MG tablet Take 1 tablet (50 mg total) by mouth daily with breakfast. 5 tablet 0   No current facility-administered medications for this visit.    Allergies  Allergen Reactions   Lisinopril Cough   Statins     Family History  Problem Relation Age of Onset   Breast cancer Sister    Cancer Sister 82       breast cancer   Alzheimer's disease Mother  Healthy Father     Social History   Socioeconomic History   Marital status: Married    Spouse name: Margalit Eisenbeis   Number of children: 0   Years of education: Not on file   Highest education level: 12th grade  Occupational History   Not on file  Tobacco Use   Smoking status: Never   Smokeless tobacco: Never  Vaping Use   Vaping Use: Never used  Substance and Sexual Activity   Alcohol use: No   Drug use: No   Sexual activity: Not Currently    Birth control/protection: None, Post-menopausal  Other Topics Concern   Not on file  Social History Narrative   Not on file   Social Determinants of Health    Financial Resource Strain: Low Risk  (05/21/2021)   Overall Financial Resource Strain (CARDIA)    Difficulty of Paying Living Expenses: Not hard at all  Food Insecurity: No Food Insecurity (05/21/2021)   Hunger Vital Sign    Worried About Running Out of Food in the Last Year: Never true    [place] in the Last Year: Never true  Transportation Needs: No Transportation Needs (05/21/2021)   PRAPARE - Hydrologist (Medical): No    Lack of Transportation (Non-Medical): No  Physical Activity: Inactive (05/21/2021)   Exercise Vital Sign    Days of Exercise per Week: 0 days    Minutes of Exercise per Session: 0 min  Stress: No Stress Concern Present (05/21/2021)   [place]    Feeling of Stress : Not at all  Social Connections: Moderately Integrated (05/21/2021)   Social Connection and Isolation Panel [NHANES]    Frequency of Communication with Friends and Family: Never    Frequency of Social Gatherings with Friends and Family: Never    Attends Religious Services: 1 to 4 times per year    Active Member of [employer] or Organizations: No    Attends Music therapist: 1 to 4 times per year    Marital Status: Married  Human resources officer Violence: Not At Risk (05/21/2021)   Humiliation, Afraid, Rape, and Kick questionnaire    Fear of Current or Ex-Partner: No    Emotionally Abused: No    Physically Abused: No    Sexually Abused: No     Constitutional: Denies fever, malaise, fatigue, headache or abrupt weight changes.  HEENT: Denies eye pain, eye redness, ear pain, ringing in the ears, wax buildup, runny nose, nasal congestion, bloody nose, or sore throat. Respiratory: Denies difficulty breathing, shortness of breath, cough or sputum production.   Cardiovascular: Denies chest pain, chest tightness, palpitations or swelling in the hands or feet.  Gastrointestinal: Denies abdominal pain, bloating,  constipation, diarrhea or blood in the stool.  GU: Patient's daughter reports urinary incontinence.  Denies urgency, frequency, pain with urination, burning sensation, blood in urine, odor or discharge. Musculoskeletal: Patient reports joint pain.  Denies decrease in range of motion, difficulty with gait, muscle pain or joint swelling.  Skin: Denies redness, rashes, lesions or ulcercations.  Neurological: Patient reports difficulty with memory.  Denies dizziness, difficulty with speech or problems with balance and coordination.  Psych: Patient's daughter reports she is having delusions and hallucinations.  Denies anxiety, depression, SI/HI.  No other specific complaints in a complete review of systems (except as listed in HPI above).  Objective:   Physical Exam  BP (!) 174/88 (BP Location: Left Arm, Patient  Position: Sitting, Cuff Size: Normal)   Pulse 75   Temp (!) 96.8 F (36 C) (Temporal)   Wt 109 lb (49.4 kg)   SpO2 100%   BMI 22.02 kg/m   Wt Readings from Last 3 Encounters:  10/09/21 100 lb (45.4 kg)  05/21/21 108 lb 3.2 oz (49.1 kg)  02/12/21 113 lb (51.3 kg)    General: Appears her stated age, well developed, well nourished in NAD. Skin: Warm, dry and intact.  HEENT: Head: normal shape and size; Eyes: sclera white, no icterus, conjunctiva pink, PERRLA and EOMs intact;  Cardiovascular: Normal rate and rhythm. S1,S2 noted.  No murmur, rubs or gallops noted. No JVD or BLE edema. No carotid bruits noted. Pulmonary/Chest: Normal effort and positive vesicular breath sounds. No respiratory distress. No wheezes, rales or ronchi noted.  Abdomen: Soft and nontender. Normal bowel sounds.  Musculoskeletal: Needs to be guided but gait slow and steady without device. Neurological: Alert, disoriented. Psychiatric: Mood and affect normal. Behavior is normal. Judgment and thought content normal.     BMET    Component Value Date/Time   NA 144 11/17/2020 0835   K 4.1 11/17/2020 0835    CL 108 11/17/2020 0835   CO2 27 11/17/2020 0835   GLUCOSE 80 11/17/2020 0835   BUN 22 11/17/2020 0835   CREATININE 0.77 11/17/2020 0835   CALCIUM 8.5 (L) 11/17/2020 0835   GFRNONAA >60 03/16/2020 1502   GFRNONAA 68 02/17/2018 0840   GFRAA >60 02/14/2019 1434   GFRAA 78 02/17/2018 0840    Lipid Panel     Component Value Date/Time   CHOL 217 (H) 11/17/2020 0835   TRIG 221 (H) 11/17/2020 0835   HDL 57 11/17/2020 0835   CHOLHDL 3.8 11/17/2020 0835   VLDL 51 (H) 07/29/2016 0823   LDLCALC 124 (H) 11/17/2020 0835    CBC    Component Value Date/Time   WBC 5.7 11/17/2020 0835   RBC 4.17 11/17/2020 0835   HGB 12.5 11/17/2020 0835   HCT 38.0 11/17/2020 0835   PLT 222 11/17/2020 0835   MCV 91.1 11/17/2020 0835   MCH 30.0 11/17/2020 0835   MCHC 32.9 11/17/2020 0835   RDW 13.2 11/17/2020 0835   LYMPHSABS 0.6 (L) 03/16/2020 1502   MONOABS 0.1 03/16/2020 1502   EOSABS 0.0 03/16/2020 1502   BASOSABS 0.0 03/16/2020 1502    Hgb A1C Lab Results  Component Value Date   HGBA1C 5.4 07/29/2016           Assessment & Plan:    RTC in 6 months, follow-up current conditions Webb Silversmith, NP

## 2022-03-30 LAB — COMPLETE METABOLIC PANEL WITH GFR
AG Ratio: 1.4 (calc) (ref 1.0–2.5)
ALT: 14 U/L (ref 6–29)
AST: 19 U/L (ref 10–35)
Albumin: 4.5 g/dL (ref 3.6–5.1)
Alkaline phosphatase (APISO): 67 U/L (ref 37–153)
BUN: 12 mg/dL (ref 7–25)
CO2: 27 mmol/L (ref 20–32)
Calcium: 9.1 mg/dL (ref 8.6–10.4)
Chloride: 104 mmol/L (ref 98–110)
Creat: 0.72 mg/dL (ref 0.60–0.95)
Globulin: 3.2 g/dL (calc) (ref 1.9–3.7)
Glucose, Bld: 111 mg/dL — ABNORMAL HIGH (ref 65–99)
Potassium: 3.6 mmol/L (ref 3.5–5.3)
Sodium: 141 mmol/L (ref 135–146)
Total Bilirubin: 0.4 mg/dL (ref 0.2–1.2)
Total Protein: 7.7 g/dL (ref 6.1–8.1)
eGFR: 82 mL/min/{1.73_m2} (ref >=60)

## 2022-03-30 LAB — CBC
HCT: 40.7 % (ref 35.0–45.0)
Hemoglobin: 13.4 g/dL (ref 11.7–15.5)
MCH: 28.6 pg (ref 27.0–33.0)
MCHC: 32.9 g/dL (ref 32.0–36.0)
MCV: 86.8 fL (ref 80.0–100.0)
MPV: 9.4 fL (ref 7.5–12.5)
Platelets: 265 10*3/uL (ref 140–400)
RBC: 4.69 10*6/uL (ref 3.80–5.10)
RDW: 13.1 % (ref 11.0–15.0)
WBC: 6 10*3/uL (ref 3.8–10.8)

## 2022-03-30 LAB — LIPID PANEL
Cholesterol: 195 mg/dL (ref <200)
HDL: 59 mg/dL (ref >=50)
LDL Cholesterol (Calc): 105 mg/dL (calc) — ABNORMAL HIGH
Non-HDL Cholesterol (Calc): 136 mg/dL (calc) — ABNORMAL HIGH (ref <130)
Total CHOL/HDL Ratio: 3.3 (calc) (ref <5.0)
Triglycerides: 190 mg/dL — ABNORMAL HIGH (ref <150)

## 2022-03-30 LAB — HEMOGLOBIN A1C
Hgb A1c MFr Bld: 6 % of total Hgb — ABNORMAL HIGH (ref <5.7)
Mean Plasma Glucose: 126 mg/dL
eAG (mmol/L): 7 mmol/L

## 2022-03-31 ENCOUNTER — Telehealth: Payer: Self-pay | Admitting: Internal Medicine

## 2022-03-31 DIAGNOSIS — F01A0 Vascular dementia, mild, without behavioral disturbance, psychotic disturbance, mood disturbance, and anxiety: Secondary | ICD-10-CM

## 2022-03-31 NOTE — Telephone Encounter (Signed)
Pt. Daughter is requesting home health for Madeline Taylor

## 2022-04-01 NOTE — Telephone Encounter (Signed)
What specifically- physical therapy, nursing, social work?

## 2022-04-01 NOTE — Telephone Encounter (Signed)
Pt's daughter needs help with dressing bathing etc...   Thanks,   -Mickel Baas

## 2022-04-02 NOTE — Addendum Note (Signed)
Addended by: Jearld Fenton on: 04/02/2022 08:31 AM   Modules accepted: Orders

## 2022-04-02 NOTE — Telephone Encounter (Signed)
Referral placed however I am not sure they cover just a home health aid

## 2022-04-21 ENCOUNTER — Telehealth: Payer: Self-pay | Admitting: Internal Medicine

## 2022-04-21 NOTE — Telephone Encounter (Signed)
The daughter of the patient called in stating she would like to talk with the patients provider but not make an appt at this time. She states her mothers dementia is really bad and would like to talk with her provider about getting on a medication that may help her. Please assist patient further as she says it has gotten rather urgent because it is really bad right now.

## 2022-04-21 NOTE — Telephone Encounter (Signed)
The best medication would be aspirin and statin. We could start her on a statin and then I could also add in Memantine 5 mg BID. Let me know if pt's daughter is agreeable.

## 2022-04-22 ENCOUNTER — Emergency Department: Payer: Medicare Other

## 2022-04-22 ENCOUNTER — Other Ambulatory Visit: Payer: Self-pay

## 2022-04-22 ENCOUNTER — Encounter: Payer: Self-pay | Admitting: Emergency Medicine

## 2022-04-22 ENCOUNTER — Inpatient Hospital Stay
Admission: EM | Admit: 2022-04-22 | Discharge: 2022-07-19 | DRG: 056 | Disposition: A | Discharge status: Skilled Nursing Facility | Payer: Medicare Other | Attending: Internal Medicine | Admitting: Internal Medicine

## 2022-04-22 DIAGNOSIS — Z8673 Personal history of transient ischemic attack (TIA), and cerebral infarction without residual deficits: Secondary | ICD-10-CM

## 2022-04-22 DIAGNOSIS — I10 Essential (primary) hypertension: Secondary | ICD-10-CM | POA: Diagnosis not present

## 2022-04-22 DIAGNOSIS — E871 Hypo-osmolality and hyponatremia: Secondary | ICD-10-CM | POA: Insufficient documentation

## 2022-04-22 DIAGNOSIS — R509 Fever, unspecified: Secondary | ICD-10-CM | POA: Diagnosis not present

## 2022-04-22 DIAGNOSIS — Z9049 Acquired absence of other specified parts of digestive tract: Secondary | ICD-10-CM

## 2022-04-22 DIAGNOSIS — G9340 Encephalopathy, unspecified: Secondary | ICD-10-CM | POA: Diagnosis not present

## 2022-04-22 DIAGNOSIS — M1909 Primary osteoarthritis, other specified site: Secondary | ICD-10-CM | POA: Diagnosis present

## 2022-04-22 DIAGNOSIS — N200 Calculus of kidney: Secondary | ICD-10-CM | POA: Diagnosis not present

## 2022-04-22 DIAGNOSIS — F0150 Vascular dementia without behavioral disturbance: Secondary | ICD-10-CM | POA: Diagnosis not present

## 2022-04-22 DIAGNOSIS — F32A Depression, unspecified: Secondary | ICD-10-CM | POA: Diagnosis present

## 2022-04-22 DIAGNOSIS — S0990XA Unspecified injury of head, initial encounter: Secondary | ICD-10-CM | POA: Diagnosis not present

## 2022-04-22 DIAGNOSIS — Z888 Allergy status to other drugs, medicaments and biological substances status: Secondary | ICD-10-CM | POA: Diagnosis not present

## 2022-04-22 DIAGNOSIS — Z820 Family history of epilepsy and other diseases of the nervous system: Secondary | ICD-10-CM

## 2022-04-22 DIAGNOSIS — Z043 Encounter for examination and observation following other accident: Secondary | ICD-10-CM | POA: Diagnosis not present

## 2022-04-22 DIAGNOSIS — T68XXXS Hypothermia, sequela: Secondary | ICD-10-CM | POA: Diagnosis not present

## 2022-04-22 DIAGNOSIS — M8589 Other specified disorders of bone density and structure, multiple sites: Secondary | ICD-10-CM | POA: Diagnosis present

## 2022-04-22 DIAGNOSIS — F03C0 Unspecified dementia, severe, without behavioral disturbance, psychotic disturbance, mood disturbance, and anxiety: Secondary | ICD-10-CM | POA: Diagnosis not present

## 2022-04-22 DIAGNOSIS — W06XXXA Fall from bed, initial encounter: Secondary | ICD-10-CM | POA: Diagnosis not present

## 2022-04-22 DIAGNOSIS — R7881 Bacteremia: Secondary | ICD-10-CM | POA: Diagnosis not present

## 2022-04-22 DIAGNOSIS — E785 Hyperlipidemia, unspecified: Secondary | ICD-10-CM | POA: Diagnosis present

## 2022-04-22 DIAGNOSIS — K863 Pseudocyst of pancreas: Secondary | ICD-10-CM | POA: Diagnosis not present

## 2022-04-22 DIAGNOSIS — F03918 Unspecified dementia, unspecified severity, with other behavioral disturbance: Secondary | ICD-10-CM | POA: Diagnosis not present

## 2022-04-22 DIAGNOSIS — F05 Delirium due to known physiological condition: Secondary | ICD-10-CM | POA: Diagnosis not present

## 2022-04-22 DIAGNOSIS — F01A11 Vascular dementia, mild, with agitation: Secondary | ICD-10-CM | POA: Diagnosis not present

## 2022-04-22 DIAGNOSIS — M159 Polyosteoarthritis, unspecified: Secondary | ICD-10-CM | POA: Diagnosis not present

## 2022-04-22 DIAGNOSIS — S0003XA Contusion of scalp, initial encounter: Secondary | ICD-10-CM | POA: Diagnosis not present

## 2022-04-22 DIAGNOSIS — N390 Urinary tract infection, site not specified: Secondary | ICD-10-CM | POA: Diagnosis not present

## 2022-04-22 DIAGNOSIS — S0012XA Contusion of left eyelid and periocular area, initial encounter: Secondary | ICD-10-CM | POA: Diagnosis not present

## 2022-04-22 DIAGNOSIS — Z1152 Encounter for screening for COVID-19: Secondary | ICD-10-CM

## 2022-04-22 DIAGNOSIS — Z8616 Personal history of COVID-19: Secondary | ICD-10-CM | POA: Diagnosis not present

## 2022-04-22 DIAGNOSIS — Z7982 Long term (current) use of aspirin: Secondary | ICD-10-CM

## 2022-04-22 DIAGNOSIS — Z72820 Sleep deprivation: Secondary | ICD-10-CM

## 2022-04-22 DIAGNOSIS — I69398 Other sequelae of cerebral infarction: Secondary | ICD-10-CM | POA: Diagnosis not present

## 2022-04-22 DIAGNOSIS — N393 Stress incontinence (female) (male): Secondary | ICD-10-CM | POA: Diagnosis present

## 2022-04-22 DIAGNOSIS — F01518 Vascular dementia, unspecified severity, with other behavioral disturbance: Secondary | ICD-10-CM | POA: Diagnosis present

## 2022-04-22 DIAGNOSIS — J302 Other seasonal allergic rhinitis: Secondary | ICD-10-CM | POA: Diagnosis present

## 2022-04-22 DIAGNOSIS — S0083XA Contusion of other part of head, initial encounter: Secondary | ICD-10-CM | POA: Diagnosis not present

## 2022-04-22 DIAGNOSIS — Z79899 Other long term (current) drug therapy: Secondary | ICD-10-CM

## 2022-04-22 DIAGNOSIS — S0083XS Contusion of other part of head, sequela: Secondary | ICD-10-CM | POA: Diagnosis not present

## 2022-04-22 DIAGNOSIS — Z0389 Encounter for observation for other suspected diseases and conditions ruled out: Secondary | ICD-10-CM | POA: Diagnosis not present

## 2022-04-22 DIAGNOSIS — B9620 Unspecified Escherichia coli [E. coli] as the cause of diseases classified elsewhere: Secondary | ICD-10-CM | POA: Diagnosis not present

## 2022-04-22 DIAGNOSIS — F01C18 Vascular dementia, severe, with other behavioral disturbance: Secondary | ICD-10-CM | POA: Diagnosis not present

## 2022-04-22 DIAGNOSIS — T68XXXA Hypothermia, initial encounter: Secondary | ICD-10-CM | POA: Diagnosis not present

## 2022-04-22 DIAGNOSIS — R680 Hypothermia, not associated with low environmental temperature: Secondary | ICD-10-CM | POA: Diagnosis present

## 2022-04-22 DIAGNOSIS — F01B11 Vascular dementia, moderate, with agitation: Secondary | ICD-10-CM | POA: Diagnosis not present

## 2022-04-22 DIAGNOSIS — F01A18 Vascular dementia, mild, with other behavioral disturbance: Secondary | ICD-10-CM | POA: Diagnosis not present

## 2022-04-22 DIAGNOSIS — G9341 Metabolic encephalopathy: Secondary | ICD-10-CM | POA: Diagnosis present

## 2022-04-22 DIAGNOSIS — F01A0 Vascular dementia, mild, without behavioral disturbance, psychotic disturbance, mood disturbance, and anxiety: Secondary | ICD-10-CM | POA: Diagnosis not present

## 2022-04-22 DIAGNOSIS — R4182 Altered mental status, unspecified: Secondary | ICD-10-CM | POA: Diagnosis not present

## 2022-04-22 DIAGNOSIS — F0390 Unspecified dementia without behavioral disturbance: Secondary | ICD-10-CM | POA: Diagnosis not present

## 2022-04-22 DIAGNOSIS — J9811 Atelectasis: Secondary | ICD-10-CM | POA: Diagnosis not present

## 2022-04-22 DIAGNOSIS — E872 Acidosis, unspecified: Secondary | ICD-10-CM | POA: Insufficient documentation

## 2022-04-22 DIAGNOSIS — K769 Liver disease, unspecified: Secondary | ICD-10-CM | POA: Diagnosis not present

## 2022-04-22 DIAGNOSIS — F01B18 Vascular dementia, moderate, with other behavioral disturbance: Secondary | ICD-10-CM | POA: Diagnosis not present

## 2022-04-22 DIAGNOSIS — M25551 Pain in right hip: Secondary | ICD-10-CM | POA: Diagnosis not present

## 2022-04-22 DIAGNOSIS — Z62890 Parent-child estrangement NEC: Secondary | ICD-10-CM

## 2022-04-22 DIAGNOSIS — Z751 Person awaiting admission to adequate facility elsewhere: Secondary | ICD-10-CM

## 2022-04-22 HISTORY — DX: Unspecified dementia, unspecified severity, without behavioral disturbance, psychotic disturbance, mood disturbance, and anxiety: F03.90

## 2022-04-22 LAB — COMPREHENSIVE METABOLIC PANEL
ALT: 19 U/L (ref 0–44)
AST: 21 U/L (ref 15–41)
Albumin: 4.1 g/dL (ref 3.5–5.0)
Alkaline Phosphatase: 59 U/L (ref 38–126)
Anion gap: 8 (ref 5–15)
BUN: 20 mg/dL (ref 8–23)
CO2: 25 mmol/L (ref 22–32)
Calcium: 9.2 mg/dL (ref 8.9–10.3)
Chloride: 106 mmol/L (ref 98–111)
Creatinine, Ser: 0.73 mg/dL (ref 0.44–1.00)
GFR, Estimated: >60 mL/min (ref >60)
Glucose, Bld: 106 mg/dL — ABNORMAL HIGH (ref 70–99)
Potassium: 4.1 mmol/L (ref 3.5–5.1)
Sodium: 139 mmol/L (ref 135–145)
Total Bilirubin: 0.3 mg/dL (ref 0.3–1.2)
Total Protein: 8.3 g/dL — ABNORMAL HIGH (ref 6.5–8.1)

## 2022-04-22 LAB — CBC
HCT: 40.7 % (ref 36.0–46.0)
Hemoglobin: 12.8 g/dL (ref 12.0–15.0)
MCH: 29 pg (ref 26.0–34.0)
MCHC: 31.4 g/dL (ref 30.0–36.0)
MCV: 92.3 fL (ref 80.0–100.0)
Platelets: 262 10*3/uL (ref 150–400)
RBC: 4.41 MIL/uL (ref 3.87–5.11)
RDW: 12.6 % (ref 11.5–15.5)
WBC: 7.1 10*3/uL (ref 4.0–10.5)
nRBC: 0 % (ref 0.0–0.2)

## 2022-04-22 LAB — URINALYSIS, ROUTINE W REFLEX MICROSCOPIC
Bilirubin Urine: NEGATIVE
Glucose, UA: NEGATIVE mg/dL
Hgb urine dipstick: NEGATIVE
Ketones, ur: NEGATIVE mg/dL
Leukocytes,Ua: NEGATIVE
Nitrite: NEGATIVE
Protein, ur: NEGATIVE mg/dL
Specific Gravity, Urine: 1.011 (ref 1.005–1.030)
pH: 6 (ref 5.0–8.0)

## 2022-04-22 LAB — CBG MONITORING, ED: Glucose-Capillary: 100 mg/dL — ABNORMAL HIGH (ref 70–99)

## 2022-04-22 MED ORDER — ONDANSETRON HCL 4 MG PO TABS: 4.0000 mg | ORAL_TABLET | Freq: Three times a day (TID) | ORAL | Status: DC | PRN

## 2022-04-22 MED ORDER — QUETIAPINE FUMARATE 25 MG PO TABS
200.0000 mg | ORAL_TABLET | Freq: Every day | ORAL | Status: DC
  Administered 2022-04-23 (×2): 200 mg via ORAL
  Filled 2022-04-22: qty 1
  Filled 2022-04-22: qty 8

## 2022-04-22 MED ORDER — HALOPERIDOL LACTATE 5 MG/ML IJ SOLN
2.5000 mg | Freq: Once | INTRAMUSCULAR | Status: AC
  Administered 2022-04-22: 2.5 mg via INTRAVENOUS
  Filled 2022-04-22: qty 1

## 2022-04-22 MED ORDER — ALUM & MAG HYDROXIDE-SIMETH 200-200-20 MG/5ML PO SUSP: 30.0000 mL | Freq: Four times a day (QID) | ORAL | Status: DC | PRN

## 2022-04-22 MED ORDER — OLANZAPINE 5 MG PO TABS
5.0000 mg | ORAL_TABLET | Freq: Once | ORAL | Status: AC | PRN
  Administered 2022-04-23: 5 mg via ORAL
  Filled 2022-04-22: qty 1

## 2022-04-22 MED ORDER — IBUPROFEN 400 MG PO TABS
600.0000 mg | ORAL_TABLET | Freq: Three times a day (TID) | ORAL | Status: DC | PRN
  Administered 2022-04-23 – 2022-05-24 (×11): 600 mg via ORAL
  Filled 2022-04-22 (×11): qty 2
  Filled 2022-04-22: qty 1
  Filled 2022-04-22: qty 2

## 2022-04-22 MED ORDER — HALOPERIDOL 1 MG PO TABS
1.0000 mg | ORAL_TABLET | Freq: Two times a day (BID) | ORAL | Status: DC
  Administered 2022-04-23 (×2): 1 mg via ORAL
  Filled 2022-04-22 (×4): qty 1

## 2022-04-22 NOTE — Consult Note (Signed)
[facility] Face-to-Face Psychiatry Consult   Reason for Consult: Altered mental status   Referring Physician: Dr. Scotty Court Patient Identification: Madeline Taylor MRN:  [id number] Principal Diagnosis: <principal problem not specified> Diagnosis:  Active Problems:   Essential hypertension   Osteoarthritis of multiple joints   Vascular dementia   History of TIA (transient ischemic attack)   Total Time spent with patient: 1 hour  Subjective: "I was sleeping." Madeline Taylor is a 87 y.o. female patient presented to the emergency department (ED) voluntarily via point of view (POV) with concerns raised by a neighbor regarding the patient's living conditions and behavior.  The patient and her husband live together, but her husband, who was her primary caregiver, is currently in a residential rehab facility due to a broken ankle. The patient's neighbor has been providing some care but noted concerning conditions in the home, including spoiled and expired food, extensive urine and feces contamination, and the patient's tendency to wander and leave the house unattended, sometimes heading towards the nearby [facility]. Adult Protective Services (APS) has been contacted and visited the home.  The patient is alert, oriented x4, calm, and cooperative with mood-congruent affect. There are no signs of delusional thinking, auditory or visual hallucinations, suicidal, homicidal, or self-harm ideations, psychotic, or paranoid behaviors. The patient responds appropriately to questions and does not appear to be responding to internal or external stimuli.  After evaluation and consultation with Dr. Scotty Court, it was determined that the patient does not meet criteria for admission to the psychiatric inpatient unit. However, the patient's living situation and care needs require attention. Follow-up may include coordination with APS and social services to address the patient's living conditions and ensure appropriate care and  supervision.  Collaboration with APS and social services is essential to address the patient's needs and ensure her safety and well-being. Continued monitoring of the patient's situation and support for her and her caregiver(s) may be necessary to prevent further deterioration of her living conditions and address any underlying issues contributing to her behavior.     HPI: Per Dr. Scotty Court, Madeline Taylor is a 87 y.o. female with a past history of hypertension and dementia who was brought to the ED by her neighbor due to confusion and unsafe home situation.   Neighbor reports that patient and her husband live in their home together, do not have any family or other caregivers in the area.  The patient's spouse was her primary caregiver, but about a month ago he broke his ankle and is currently in a residential rehab facility.  During this time the patient's neighbor has stayed with her to take care of her, but she notes that all of the food in the residence is spoiled and expired, and there is extensive urine and feces contamination on the living surfaces.  Additionally, the patient wanders and leaves the house and is not redirectable.  Sometimes the patient insists on walking down to the nearby [facility].  The neighbor is worried that the patient requires 24-hour supervision and could quickly be in a dangerous situation during even brief periods when the neighbors using the bathroom or bathing.  The neighbor has contacted APS who has visited the home.   The patient denies any complaints herself, denies pain.  She exhibits paranoia and confusion  Past Psychiatric History:  Dementia  Risk to Self:   Risk to Others:   Prior Inpatient Therapy:   Prior Outpatient Therapy:    Past Medical History:  Past Medical  History:  Diagnosis Date   Dementia    Essential hypertension 07/29/2016   History of 2019 novel coronavirus disease (COVID-19) 01/16/2019   History of vertigo 07/29/2016   Hyperlipidemia  07/29/2016   Osteoarthritis of back 07/29/2016   Osteopenia of multiple sites 07/29/2016   Pancreatitis    Seasonal allergies 07/29/2016   Shingles 05/01/2019   Urinary, incontinence, stress female 07/29/2016    Past Surgical History:  Procedure Laterality Date   CHOLECYSTECTOMY     no past surgery     Family History:  Family History  Problem Relation Age of Onset   Breast cancer Sister    Cancer Sister 37       breast cancer   Alzheimer's disease Mother    Healthy Father    Family Psychiatric  History:  Social History:  Social History   Substance and Sexual Activity  Alcohol Use No     Social History   Substance and Sexual Activity  Drug Use No    Social History   Socioeconomic History   Marital status: Married    Spouse name: [place] Agent   Number of children: 0   Years of education: Not on file   Highest education level: 12th grade  Occupational History   Not on file  Tobacco Use   Smoking status: Never   Smokeless tobacco: Never  Vaping Use   Vaping Use: Never used  Substance and Sexual Activity   Alcohol use: No   Drug use: No   Sexual activity: Not Currently    Birth control/protection: None, Post-menopausal  Other Topics Concern   Not on file  Social History Narrative   Not on file   Social Determinants of Health   Financial Resource Strain: Low Risk  (05/21/2021)   Overall Financial Resource Strain (CARDIA)    Difficulty of Paying Living Expenses: Not hard at all  Food Insecurity: No Food Insecurity (05/21/2021)   Hunger Vital Sign    Worried About Running Out of Food in the Last Year: Never true    Ran Out of Food in the Last Year: Never true  Transportation Needs: No Transportation Needs (05/21/2021)   PRAPARE - Administrator, Civil Service (Medical): No    Lack of Transportation (Non-Medical): No  Physical Activity: Inactive (05/21/2021)   Exercise Vital Sign    Days of Exercise per Week: 0 days    Minutes of Exercise per  Session: 0 min  Stress: No Stress Concern Present (05/21/2021)   [employer] of Occupational Health - Occupational Stress Questionnaire    Feeling of Stress : Not at all  Social Connections: Moderately Integrated (05/21/2021)   Social Connection and Isolation Panel [NHANES]    Frequency of Communication with Friends and Family: Never    Frequency of Social Gatherings with Friends and Family: Never    Attends Religious Services: 1 to 4 times per year    Active Member of [employer] or Organizations: No    Attends Engineer, structural: 1 to 4 times per year    Marital Status: Married   Additional Social History:    Allergies:   Allergies  Allergen Reactions   Lisinopril Cough   Statins     Labs:  Results for orders placed or performed during the hospital encounter of 04/22/22 (from the past 48 hour(s))  Comprehensive metabolic panel     Status: Abnormal   Collection Time: 04/22/22  5:26 PM  Result Value Ref Range   Sodium 139  135 - 145 mmol/L   Potassium 4.1 3.5 - 5.1 mmol/L   Chloride 106 98 - 111 mmol/L   CO2 25 22 - 32 mmol/L   Glucose, Bld 106 (H) 70 - 99 mg/dL    Comment: Glucose reference range applies only to samples taken after fasting for at least 8 hours.   BUN 20 8 - 23 mg/dL   Creatinine, Ser 1.61 0.44 - 1.00 mg/dL   Calcium 9.2 8.9 - 09.6 mg/dL   Total Protein 8.3 (H) 6.5 - 8.1 g/dL   Albumin 4.1 3.5 - 5.0 g/dL   AST 21 15 - 41 U/L   ALT 19 0 - 44 U/L   Alkaline Phosphatase 59 38 - 126 U/L   Total Bilirubin 0.3 0.3 - 1.2 mg/dL   GFR, Estimated >04 >54 mL/min    Comment: (NOTE) Calculated using the CKD-EPI Creatinine Equation (2021)    Anion gap 8 5 - 15    Comment: Performed at [facility], [address] [place], [place] [address]  CBC     Status: None   Collection Time: 04/22/22  5:26 PM  Result Value Ref Range   WBC 7.1 4.0 - 10.5 K/uL   RBC 4.41 3.87 - 5.11 MIL/uL   Hemoglobin 12.8 12.0 - 15.0 g/dL   HCT 91.4 78.2 - 95.6 %   MCV  92.3 80.0 - 100.0 fL   MCH 29.0 26.0 - 34.0 pg   MCHC 31.4 30.0 - 36.0 g/dL   RDW 21.3 08.6 - 57.8 %   Platelets 262 150 - 400 K/uL   nRBC 0.0 0.0 - 0.2 %    Comment: Performed at [facility], [address] [place], [place] [address]  CBG monitoring, ED     Status: Abnormal   Collection Time: 04/22/22  5:33 PM  Result Value Ref Range   Glucose-Capillary 100 (H) 70 - 99 mg/dL    Comment: Glucose reference range applies only to samples taken after fasting for at least 8 hours.  Urinalysis, Routine w reflex microscopic -Urine, Clean Catch     Status: Abnormal   Collection Time: 04/22/22  7:54 PM  Result Value Ref Range   Color, Urine STRAW (A) YELLOW   APPearance CLEAR (A) CLEAR   Specific Gravity, Urine 1.011 1.005 - 1.030   pH 6.0 5.0 - 8.0   Glucose, UA NEGATIVE NEGATIVE mg/dL   Hgb urine dipstick NEGATIVE NEGATIVE   Bilirubin Urine NEGATIVE NEGATIVE   Ketones, ur NEGATIVE NEGATIVE mg/dL   Protein, ur NEGATIVE NEGATIVE mg/dL   Nitrite NEGATIVE NEGATIVE   Leukocytes,Ua NEGATIVE NEGATIVE    Comment: Performed at [facility], [address] [place], [place] [address]    Current Facility-Administered Medications  Medication Dose Route Frequency Provider Last Rate Last Admin   alum & mag hydroxide-simeth (MAALOX/MYLANTA) 200-200-20 MG/5ML suspension 30 mL  30 mL Oral Q6H PRN Sharman Cheek, MD       haloperidol (HALDOL) tablet 1 mg  1 mg Oral BID Gillermo Murdoch, NP       ibuprofen (ADVIL) tablet 600 mg  600 mg Oral Q8H PRN Sharman Cheek, MD       OLANZapine [facility]) tablet 5 mg  5 mg Oral Once PRN Gillermo Murdoch, NP       ondansetron [facility]) tablet 4 mg  4 mg Oral Q8H PRN Sharman Cheek, MD       QUEtiapine (SEROQUEL) tablet 200 mg  200 mg Oral QHS Gillermo Murdoch, NP  Current Outpatient Medications  Medication Sig Dispense Refill   acetaminophen (TYLENOL) 325 MG tablet Take 2 tablets (650 mg total) by mouth every 6 (six)  hours as needed for moderate pain. 30 tablet 0   amLODipine-olmesartan (AZOR) 10-40 MG tablet Take 1 tablet by mouth daily. 90 tablet 1   aspirin 81 MG EC tablet Take 1 tablet (81 mg total) by mouth daily. Swallow whole. (Patient not taking: Reported on 05/21/2021) 30 tablet 12    Musculoskeletal: Strength & Muscle Tone: within normal limits Gait & Station: normal Patient leans: N/A Psychiatric Specialty Exam:  Presentation  General Appearance:  Appropriate for Environment  Eye Contact: Minimal  Speech: Slow  Speech Volume: Normal  Handedness: Right   Mood and Affect  Mood: Euthymic  Affect: Flat   Thought Process  Thought Processes: Disorganized  Descriptions of Associations:Loose  Orientation:Partial  Thought Content:Illogical  History of Schizophrenia/Schizoaffective disorder:No data recorded Duration of Psychotic Symptoms:No data recorded Hallucinations:Hallucinations: Other (comment)  Ideas of Reference:None  Suicidal Thoughts:Suicidal Thoughts: No  Homicidal Thoughts:Homicidal Thoughts: No   Sensorium  Memory: Recent Poor; Immediate Poor; Remote Poor  Judgment: Impaired  Insight: Other (comment)   Executive Functions  Concentration: Poor  Attention Span: Poor  Recall: Poor  Fund of Knowledge: Poor  Language: Poor   Psychomotor Activity  Psychomotor Activity: Psychomotor Activity: Normal   Assets  Assets: Other (comment)   Sleep  Sleep: Sleep: Poor   Physical Exam: Physical Exam Vitals and nursing note reviewed. Exam conducted with a chaperone present.  Constitutional:      Appearance: She is normal weight.  HENT:     Head: Normocephalic and atraumatic.     Nose: Nose normal.     Mouth/Throat:     Mouth: Mucous membranes are dry.  Cardiovascular:     Rate and Rhythm: Normal rate.     Pulses: Normal pulses.  Pulmonary:     Effort: Pulmonary effort is normal.  Musculoskeletal:        General: Normal  range of motion.  Neurological:     Mental Status: She is alert. Mental status is at baseline. She is disoriented.  Psychiatric:        Attention and Perception: She is inattentive.        Mood and Affect: Mood is depressed. Affect is flat and inappropriate.        Speech: Speech is delayed.        Behavior: Behavior is uncooperative and withdrawn.        Thought Content: Thought content is delusional.        Cognition and Memory: Cognition is impaired. Memory is impaired. She exhibits impaired recent memory and impaired remote memory.        Judgment: Judgment is impulsive and inappropriate.    Review of Systems  Psychiatric/Behavioral:  Positive for depression.   All other systems reviewed and are negative.  Blood pressure (!) 155/71, pulse 80, temperature (!) 97.5 F (36.4 C), temperature source Oral, resp. rate 18, height 4\' 11"  (1.499 m), weight 49.4 kg, SpO2 97 %. Body mass index is 22 kg/m.  Treatment Plan Summary: Daily contact with patient to assess and evaluate symptoms and progress in treatment and Medication management The patient will be started on medication to assist with her mood.   Disposition: No evidence of imminent risk to self or others at present.   Patient does not meet criteria for psychiatric inpatient admission. Supportive therapy provided about ongoing stressors. Refer to IOP. Discussed  crisis plan, support from social network, calling 911, coming to the Emergency Department, and calling Suicide Hotline.  Gillermo MurdochJacqueline Avory Rahimi, NP 04/22/2022 11:45 PM

## 2022-04-22 NOTE — ED Provider Notes (Signed)
[facility] Provider Note    Event Date/Time   First MD Initiated Contact with Patient 04/22/22 1921     (approximate)   History   Chief Complaint: Altered mental status  HPI  Madeline Taylor is a 87 y.o. female with a past history of hypertension and dementia who was brought to the ED by her neighbor due to confusion and unsafe home situation.  Neighbor reports that patient and her husband live in their home together, do not have any family or other caregivers in the area.  The patient's spouse was her primary caregiver, but about a month ago he broke his ankle and is currently in a residential rehab facility.  During this time the patient's neighbor has stayed with her to take care of her, but she notes that all of the food in the residence is spoiled and expired, and there is extensive urine and feces contamination on the living surfaces.  Additionally, the patient wanders and leaves the house and is not redirectable.  Sometimes the patient insists on walking down to the nearby [facility].  The neighbor is worried that the patient requires 24-hour supervision and could quickly be in a dangerous situation during even brief periods when the neighbors using the bathroom or bathing.  The neighbor has contacted APS who has visited the home.  The patient denies any complaints herself, denies pain.  She exhibits paranoia and confusion.     Physical Exam   Triage Vital Signs: ED Triage Vitals  Enc Vitals Group     BP 04/22/22 1727 (!) 155/71     Pulse Rate 04/22/22 1727 80     Resp 04/22/22 1727 18     Temp 04/22/22 1727 (!) 97.5 F (36.4 C)     Temp Source 04/22/22 1727 Oral     SpO2 04/22/22 1727 97 %     Weight 04/22/22 1757 108 lb 14.5 oz (49.4 kg)     Height 04/22/22 1757 4\' 11"  (1.499 m)     Head Circumference --      Peak Flow --      Pain Score --      Pain Loc --      Pain Edu? --      Excl. in [place]? --     Most recent vital signs: Vitals:    04/22/22 1727  BP: (!) 155/71  Pulse: 80  Resp: 18  Temp: (!) 97.5 F (36.4 C)  SpO2: 97%    General: Awake, no distress.  Disoriented CV:  Good peripheral perfusion.  Regular rate rhythm Resp:  Normal effort.  Clear to auscultation bilaterally Abd:  No distention.  Soft nontender Other:  No signs of trauma   ED Results / Procedures / Treatments   Labs (all labs ordered are listed, but only abnormal results are displayed) Labs Reviewed  COMPREHENSIVE METABOLIC PANEL - Abnormal; Notable for the following components:      Result Value   Glucose, Bld 106 (*)    Total Protein 8.3 (*)    All other components within normal limits  URINALYSIS, ROUTINE W REFLEX MICROSCOPIC - Abnormal; Notable for the following components:   Color, Urine STRAW (*)    APPearance CLEAR (*)    All other components within normal limits  CBG MONITORING, ED - Abnormal; Notable for the following components:   Glucose-Capillary 100 (*)    All other components within normal limits  CBC  ACETAMINOPHEN LEVEL  SALICYLATE LEVEL  EKG    RADIOLOGY CT head interpreted by me, negative for mass or intracranial hemorrhage.  Radiology report reviewed   PROCEDURES:  Procedures   MEDICATIONS ORDERED IN ED: Medications  ibuprofen (ADVIL) tablet 600 mg (has no administration in time range)  ondansetron (ZOFRAN) tablet 4 mg (has no administration in time range)  alum & mag hydroxide-simeth (MAALOX/MYLANTA) 200-200-20 MG/5ML suspension 30 mL (has no administration in time range)  QUEtiapine (SEROQUEL) tablet 200 mg (has no administration in time range)  haloperidol (HALDOL) tablet 1 mg (has no administration in time range)  OLANZapine (ZYPREXA) tablet 5 mg (has no administration in time range)  haloperidol lactate (HALDOL) injection 2.5 mg (2.5 mg Intravenous Given 04/22/22 2014)     IMPRESSION / MDM / [place] / ED COURSE  I reviewed the triage vital signs and the nursing notes.  DDx:  Advanced dementia, AKI, electrolyte abnormality, UTI, intracranial hemorrhage, intracranial mass  Patient's presentation is most consistent with acute presentation with potential threat to life or bodily function.  Patient presents with altered mental status, likely due to severe dementia.  Per report from the patient's neighbor who has attempted to step and is a temporary caregiver, the patient is not safe at home, not able to care for herself, and may require memory care locked unit level care.  Will retain the patient in the emergency department pending further assessment.  Patient was seen by psychiatry team who has started some medication recommendations.  The patient is not under IVC, but she clearly lacks medical decision-making capacity and is not able to be discharged from the ED on her own.  The patient has been placed in psychiatric observation due to the need to provide a safe environment for the patient while obtaining psychiatric consultation and evaluation, as well as ongoing medical and medication management to treat the patient's condition.  The patient has not been placed under full IVC at this time.        FINAL CLINICAL IMPRESSION(S) / ED DIAGNOSES   Final diagnoses:  Severe dementia, unspecified dementia type, unspecified whether behavioral, psychotic, or mood disturbance or anxiety     Rx / DC Orders   ED Discharge Orders     None        Note:  This document was prepared using Dragon voice recognition software and may include unintentional dictation errors.   Carrie Mew, MD 04/22/22 2350

## 2022-04-22 NOTE — Consult Note (Incomplete)
[facility] Consult   Reason for Consult: Altered mental status   Referring Physician: Dr. Joni Fears Patient Identification: Madeline Taylor MRN:  [id number] Principal Diagnosis: <principal problem not specified> Diagnosis:  Active Problems:   Essential hypertension   Osteoarthritis of multiple joints   Vascular dementia   History of TIA (transient ischemic attack)   Total Time spent with patient: 1 hour  Subjective: "I was sleeping." Madeline Taylor is a 87 y.o. female patient presented to [facility] ED via POV voluntary. The patient reports that patient and her husband live in their home together, do not have any family or other caregivers in the area.  The patient's spouse was her primary caregiver, but about a month ago he broke his ankle and is currently in a residential rehab facility.  During this time the patient's neighbor has stayed with her to take care of her, but she notes that all of the food in the residence is spoiled and expired, and there is extensive urine and feces contamination on the living surfaces.  Additionally, the patient wanders and leaves the house and is not redirectable.  Sometimes the patient insists on walking down to the nearby [facility].  The neighbor is worried that the patient requires 24-hour supervision and could quickly be in a dangerous situation during even brief periods when the neighbors using the bathroom or bathing.  The neighbor has contacted APS who has visited the home.     The patient was seen face-to-face by this provider; chart reviewed and consulted with Dr. ED providers and Dr. Lajuana Ripple who ever doc on call on 03/00/2024 due to the care of the patient. It was discussed with the EDP that the patient does/does not meet the criteria to be admitted to the psychiatric inpatient unit.  On evaluation the patient is alert and oriented x4, calm and cooperative, and mood-congruent with affect.  The patient does not appear to be responding to internal  or external stimuli. Neither is the patient presenting with any delusional thinking. The patient denies auditory or visual hallucinations. The patient denies any suicidal, homicidal, or self-harm ideations. The patient is not presenting with any psychotic or paranoid behaviors. During an encounter with the patient, he/she could answer questions appropriately.  HPI: Per Dr. Joni Fears, Madeline Taylor is a 87 y.o. female with a past history of hypertension and dementia who was brought to the ED by her neighbor due to confusion and unsafe home situation.   Neighbor reports that patient and her husband live in their home together, do not have any family or other caregivers in the area.  The patient's spouse was her primary caregiver, but about a month ago he broke his ankle and is currently in a residential rehab facility.  During this time the patient's neighbor has stayed with her to take care of her, but she notes that all of the food in the residence is spoiled and expired, and there is extensive urine and feces contamination on the living surfaces.  Additionally, the patient wanders and leaves the house and is not redirectable.  Sometimes the patient insists on walking down to the nearby [facility].  The neighbor is worried that the patient requires 24-hour supervision and could quickly be in a dangerous situation during even brief periods when the neighbors using the bathroom or bathing.  The neighbor has contacted APS who has visited the home.   The patient denies any complaints herself, denies pain.  She exhibits paranoia and confusion  Past Psychiatric History:  Dementia  Risk to Self:   Risk to Others:   Prior Inpatient Therapy:   Prior Outpatient Therapy:    Past Medical History:  Past Medical History:  Diagnosis Date  . Dementia   . Essential hypertension 07/29/2016  . History of 2019 novel coronavirus disease (COVID-19) 01/16/2019  . History of vertigo 07/29/2016  . Hyperlipidemia  07/29/2016  . Osteoarthritis of back 07/29/2016  . Osteopenia of multiple sites 07/29/2016  . Pancreatitis   . Seasonal allergies 07/29/2016  . Shingles 05/01/2019  . Urinary, incontinence, stress female 07/29/2016    Past Surgical History:  Procedure Laterality Date  . CHOLECYSTECTOMY    . no past surgery     Family History:  Family History  Problem Relation Age of Onset  . Breast cancer Sister   . Cancer Sister 12       breast cancer  . Alzheimer's disease Mother   . Healthy Father    Family Psychiatric  History:  Social History:  Social History   Substance and Sexual Activity  Alcohol Use No     Social History   Substance and Sexual Activity  Drug Use No    Social History   Socioeconomic History  . Marital status: Married    Spouse name: Avaleen Tillman  . Number of children: 0  . Years of education: Not on file  . Highest education level: 12th grade  Occupational History  . Not on file  Tobacco Use  . Smoking status: Never  . Smokeless tobacco: Never  Vaping Use  . Vaping Use: Never used  Substance and Sexual Activity  . Alcohol use: No  . Drug use: No  . Sexual activity: Not Currently    Birth control/protection: None, Post-menopausal  Other Topics Concern  . Not on file  Social History Narrative  . Not on file   Social Determinants of Health   Financial Resource Strain: Low Risk  (05/21/2021)   Overall Financial Resource Strain (CARDIA)   . Difficulty of Paying Living Expenses: Not hard at all  Food Insecurity: No Food Insecurity (05/21/2021)   Hunger Vital Sign   . Worried About Charity fundraiser in the Last Year: Never true   . Ran Out of Food in the Last Year: Never true  Transportation Needs: No Transportation Needs (05/21/2021)   PRAPARE - Transportation   . Lack of Transportation (Medical): No   . Lack of Transportation (Non-Medical): No  Physical Activity: Inactive (05/21/2021)   Exercise Vital Sign   . Days of Exercise per Week: 0 days   .  Minutes of Exercise per Session: 0 min  Stress: No Stress Concern Present (05/21/2021)   [place]   . Feeling of Stress : Not at all  Social Connections: Moderately Integrated (05/21/2021)   Social Connection and Isolation Panel [NHANES]   . Frequency of Communication with Friends and Family: Never   . Frequency of Social Gatherings with Friends and Family: Never   . Attends Religious Services: 1 to 4 times per year   . Active Member of Clubs or Organizations: No   . Attends Archivist Meetings: 1 to 4 times per year   . Marital Status: Married   Additional Social History:    Allergies:   Allergies  Allergen Reactions  . Lisinopril Cough  . Statins     Labs:  Results for orders placed or performed during the hospital encounter of  04/22/22 (from the past 48 hour(s))  Comprehensive metabolic panel     Status: Abnormal   Collection Time: 04/22/22  5:26 PM  Result Value Ref Range   Sodium 139 135 - 145 mmol/L   Potassium 4.1 3.5 - 5.1 mmol/L   Chloride 106 98 - 111 mmol/L   CO2 25 22 - 32 mmol/L   Glucose, Bld 106 (H) 70 - 99 mg/dL    Comment: Glucose reference range applies only to samples taken after fasting for at least 8 hours.   BUN 20 8 - 23 mg/dL   Creatinine, Ser 0.73 0.44 - 1.00 mg/dL   Calcium 9.2 8.9 - 10.3 mg/dL   Total Protein 8.3 (H) 6.5 - 8.1 g/dL   Albumin 4.1 3.5 - 5.0 g/dL   AST 21 15 - 41 U/L   ALT 19 0 - 44 U/L   Alkaline Phosphatase 59 38 - 126 U/L   Total Bilirubin 0.3 0.3 - 1.2 mg/dL   GFR, Estimated >60 >60 mL/min    Comment: (NOTE) Calculated using the CKD-EPI Creatinine Equation (2021)    Anion gap 8 5 - 15    Comment: Performed at [facility], [address] [place], [place] [address]  CBC     Status: None   Collection Time: 04/22/22  5:26 PM  Result Value Ref Range   WBC 7.1 4.0 - 10.5 K/uL   RBC 4.41 3.87 - 5.11 MIL/uL   Hemoglobin 12.8 12.0 - 15.0 g/dL    HCT 40.7 36.0 - 46.0 %   MCV 92.3 80.0 - 100.0 fL   MCH 29.0 26.0 - 34.0 pg   MCHC 31.4 30.0 - 36.0 g/dL   RDW 12.6 11.5 - 15.5 %   Platelets 262 150 - 400 K/uL   nRBC 0.0 0.0 - 0.2 %    Comment: Performed at [facility], [address] [place], [place] [address]  CBG monitoring, ED     Status: Abnormal   Collection Time: 04/22/22  5:33 PM  Result Value Ref Range   Glucose-Capillary 100 (H) 70 - 99 mg/dL    Comment: Glucose reference range applies only to samples taken after fasting for at least 8 hours.  Urinalysis, Routine w reflex microscopic -Urine, Clean Catch     Status: Abnormal   Collection Time: 04/22/22  7:54 PM  Result Value Ref Range   Color, Urine STRAW (A) YELLOW   APPearance CLEAR (A) CLEAR   Specific Gravity, Urine 1.011 1.005 - 1.030   pH 6.0 5.0 - 8.0   Glucose, UA NEGATIVE NEGATIVE mg/dL   Hgb urine dipstick NEGATIVE NEGATIVE   Bilirubin Urine NEGATIVE NEGATIVE   Ketones, ur NEGATIVE NEGATIVE mg/dL   Protein, ur NEGATIVE NEGATIVE mg/dL   Nitrite NEGATIVE NEGATIVE   Leukocytes,Ua NEGATIVE NEGATIVE    Comment: Performed at [facility], [address], [place], [place] [address]    Current Facility-Administered Medications  Medication Dose Route Frequency Provider Last Rate Last Admin  . alum & mag hydroxide-simeth (MAALOX/MYLANTA) 200-200-20 MG/5ML suspension 30 mL  30 mL Oral Q6H PRN Carrie Mew, MD      . haloperidol (HALDOL) tablet 1 mg  1 mg Oral BID Caroline Sauger, NP      . ibuprofen (ADVIL) tablet 600 mg  600 mg Oral Q8H PRN Carrie Mew, MD      . OLANZapine [facility]) tablet 5 mg  5 mg Oral Once PRN Caroline Sauger, NP      . ondansetron (  ZOFRAN) tablet 4 mg  4 mg Oral Q8H PRN Carrie Mew, MD      . QUEtiapine (SEROQUEL) tablet 200 mg  200 mg Oral QHS Caroline Sauger, NP       Current Outpatient Medications  Medication Sig Dispense Refill  . acetaminophen (TYLENOL) 325 MG tablet Take 2 tablets  (650 mg total) by mouth every 6 (six) hours as needed for moderate pain. 30 tablet 0  . amLODipine-olmesartan (AZOR) 10-40 MG tablet Take 1 tablet by mouth daily. 90 tablet 1  . aspirin 81 MG EC tablet Take 1 tablet (81 mg total) by mouth daily. Swallow whole. (Patient not taking: Reported on 05/21/2021) 30 tablet 12    Musculoskeletal: Strength & Muscle Tone: within normal limits Gait & Station: normal Patient leans: N/A Psychiatric Specialty Exam:  Presentation  General Appearance:  Appropriate for Environment  Eye Contact: Minimal  Speech: Slow  Speech Volume: Normal  Handedness: Right   Mood and Affect  Mood: Euthymic  Affect: Flat   Thought Process  Thought Processes: Disorganized  Descriptions of Associations:Loose  Orientation:Partial  Thought Content:Illogical  History of Schizophrenia/Schizoaffective disorder:No data recorded Duration of Psychotic Symptoms:No data recorded Hallucinations:Hallucinations: Other (comment)  Ideas of Reference:None  Suicidal Thoughts:Suicidal Thoughts: No  Homicidal Thoughts:Homicidal Thoughts: No   Sensorium  Memory: Recent Poor; Immediate Poor; Remote Poor  Judgment: Impaired  Insight: Other (comment)   Executive Functions  Concentration: Poor  Attention Span: Poor  Recall: Poor  Fund of Knowledge: Poor  Language: Poor   Psychomotor Activity  Psychomotor Activity: Psychomotor Activity: Normal   Assets  Assets: Other (comment)   Sleep  Sleep: Sleep: Poor   Physical Exam: Physical Exam Vitals and nursing note reviewed. Exam conducted with a chaperone present.  Constitutional:      Appearance: She is normal weight.  HENT:     Head: Normocephalic and atraumatic.     Nose: Nose normal.     Mouth/Throat:     Mouth: Mucous membranes are dry.  Cardiovascular:     Rate and Rhythm: Normal rate.     Pulses: Normal pulses.  Pulmonary:     Effort: Pulmonary effort is normal.   Musculoskeletal:        General: Normal range of motion.  Neurological:     Mental Status: She is alert. Mental status is at baseline. She is disoriented.  Psychiatric:        Attention and Perception: She is inattentive.        Mood and Affect: Mood is depressed. Affect is flat and inappropriate.        Speech: Speech is delayed.        Behavior: Behavior is uncooperative and withdrawn.        Thought Content: Thought content is delusional.        Cognition and Memory: Cognition is impaired. Memory is impaired. She exhibits impaired recent memory and impaired remote memory.        Judgment: Judgment is impulsive and inappropriate.    Review of Systems  Psychiatric/Behavioral:  Positive for depression.   All other systems reviewed and are negative.  Blood pressure (!) 155/71, pulse 80, temperature (!) 97.5 F (36.4 C), temperature source Oral, resp. rate 18, height 4\' 11"  (1.499 m), weight 49.4 kg, SpO2 97 %. Body mass index is 22 kg/m.  Treatment Plan Summary: Daily contact with patient to assess and evaluate symptoms and progress in treatment and Medication management  Disposition: No evidence of imminent risk to self  or others at present.   Patient does not meet criteria for psychiatric inpatient admission. Supportive therapy provided about ongoing stressors. Refer to IOP. Discussed crisis plan, support from social network, calling 911, coming to the Emergency Department, and calling Suicide Hotline.  Caroline Sauger, NP 04/22/2022 11:45 PM

## 2022-04-22 NOTE — Telephone Encounter (Signed)
Pt's daughter advised.  She agreed to start her on a statin and Memantine.  Please send to [facility].   Ms. Ebony Hail wanted Rollene Fare to know that Adult Protected Services have been to the house and recommend Ms. Ballew go to the ER due to worsening behavior and not being safe at home anymore.  Ms. Ebony Hail is going to look into having Ms. Lord placed in a nursing home.    Thanks,   -Mickel Baas

## 2022-04-22 NOTE — ED Triage Notes (Signed)
Patient with recent COVID.  Has recovered well.

## 2022-04-22 NOTE — ED Triage Notes (Signed)
Patient arrives with neighbor. Patient has history of dementia and lives with husband.  According to neighbor, husband is currently in a rehabilitation hospital due to an ankle fracture.  Patient has been living with neighbor.  Patient has become more aggressive, wandering, and unable to redirect over the past week. Adult Protective Services has been notified and out to the home twice today.  Neighbor states that when she went into patients home, house was very dirty and refrigerator had rotten food in it.

## 2022-04-23 ENCOUNTER — Emergency Department: Payer: Medicare Other

## 2022-04-23 ENCOUNTER — Inpatient Hospital Stay: Payer: Medicare Other

## 2022-04-23 ENCOUNTER — Encounter: Payer: Self-pay | Admitting: Family Medicine

## 2022-04-23 DIAGNOSIS — Z888 Allergy status to other drugs, medicaments and biological substances status: Secondary | ICD-10-CM | POA: Diagnosis not present

## 2022-04-23 DIAGNOSIS — R7881 Bacteremia: Secondary | ICD-10-CM | POA: Diagnosis not present

## 2022-04-23 DIAGNOSIS — T68XXXA Hypothermia, initial encounter: Secondary | ICD-10-CM | POA: Diagnosis not present

## 2022-04-23 DIAGNOSIS — I10 Essential (primary) hypertension: Secondary | ICD-10-CM | POA: Diagnosis present

## 2022-04-23 DIAGNOSIS — G9340 Encephalopathy, unspecified: Secondary | ICD-10-CM | POA: Diagnosis present

## 2022-04-23 DIAGNOSIS — S0012XA Contusion of left eyelid and periocular area, initial encounter: Secondary | ICD-10-CM | POA: Diagnosis not present

## 2022-04-23 DIAGNOSIS — E871 Hypo-osmolality and hyponatremia: Secondary | ICD-10-CM | POA: Diagnosis present

## 2022-04-23 DIAGNOSIS — F01B11 Vascular dementia, moderate, with agitation: Secondary | ICD-10-CM | POA: Diagnosis not present

## 2022-04-23 DIAGNOSIS — S0083XA Contusion of other part of head, initial encounter: Secondary | ICD-10-CM | POA: Diagnosis not present

## 2022-04-23 DIAGNOSIS — F32A Depression, unspecified: Secondary | ICD-10-CM | POA: Diagnosis present

## 2022-04-23 DIAGNOSIS — S0083XS Contusion of other part of head, sequela: Secondary | ICD-10-CM | POA: Diagnosis not present

## 2022-04-23 DIAGNOSIS — F0150 Vascular dementia without behavioral disturbance: Secondary | ICD-10-CM | POA: Diagnosis not present

## 2022-04-23 DIAGNOSIS — N390 Urinary tract infection, site not specified: Secondary | ICD-10-CM | POA: Diagnosis not present

## 2022-04-23 DIAGNOSIS — M8589 Other specified disorders of bone density and structure, multiple sites: Secondary | ICD-10-CM | POA: Diagnosis present

## 2022-04-23 DIAGNOSIS — F03C0 Unspecified dementia, severe, without behavioral disturbance, psychotic disturbance, mood disturbance, and anxiety: Secondary | ICD-10-CM | POA: Diagnosis not present

## 2022-04-23 DIAGNOSIS — Z1152 Encounter for screening for COVID-19: Secondary | ICD-10-CM | POA: Diagnosis not present

## 2022-04-23 DIAGNOSIS — S0003XA Contusion of scalp, initial encounter: Secondary | ICD-10-CM | POA: Diagnosis not present

## 2022-04-23 DIAGNOSIS — S0990XA Unspecified injury of head, initial encounter: Secondary | ICD-10-CM | POA: Diagnosis not present

## 2022-04-23 DIAGNOSIS — N393 Stress incontinence (female) (male): Secondary | ICD-10-CM | POA: Diagnosis present

## 2022-04-23 DIAGNOSIS — W06XXXA Fall from bed, initial encounter: Secondary | ICD-10-CM | POA: Diagnosis not present

## 2022-04-23 DIAGNOSIS — J9811 Atelectasis: Secondary | ICD-10-CM | POA: Diagnosis not present

## 2022-04-23 DIAGNOSIS — F05 Delirium due to known physiological condition: Secondary | ICD-10-CM | POA: Diagnosis not present

## 2022-04-23 DIAGNOSIS — B9620 Unspecified Escherichia coli [E. coli] as the cause of diseases classified elsewhere: Secondary | ICD-10-CM | POA: Diagnosis not present

## 2022-04-23 DIAGNOSIS — R4182 Altered mental status, unspecified: Secondary | ICD-10-CM | POA: Diagnosis not present

## 2022-04-23 DIAGNOSIS — F01A11 Vascular dementia, mild, with agitation: Secondary | ICD-10-CM | POA: Diagnosis not present

## 2022-04-23 DIAGNOSIS — R680 Hypothermia, not associated with low environmental temperature: Secondary | ICD-10-CM | POA: Diagnosis present

## 2022-04-23 DIAGNOSIS — F01B18 Vascular dementia, moderate, with other behavioral disturbance: Secondary | ICD-10-CM | POA: Diagnosis not present

## 2022-04-23 DIAGNOSIS — E785 Hyperlipidemia, unspecified: Secondary | ICD-10-CM | POA: Diagnosis present

## 2022-04-23 DIAGNOSIS — Z8616 Personal history of COVID-19: Secondary | ICD-10-CM | POA: Diagnosis not present

## 2022-04-23 DIAGNOSIS — F01C18 Vascular dementia, severe, with other behavioral disturbance: Secondary | ICD-10-CM | POA: Diagnosis not present

## 2022-04-23 DIAGNOSIS — N200 Calculus of kidney: Secondary | ICD-10-CM | POA: Diagnosis not present

## 2022-04-23 DIAGNOSIS — M159 Polyosteoarthritis, unspecified: Secondary | ICD-10-CM | POA: Diagnosis not present

## 2022-04-23 DIAGNOSIS — M1909 Primary osteoarthritis, other specified site: Secondary | ICD-10-CM | POA: Diagnosis present

## 2022-04-23 DIAGNOSIS — Z8673 Personal history of transient ischemic attack (TIA), and cerebral infarction without residual deficits: Secondary | ICD-10-CM | POA: Diagnosis not present

## 2022-04-23 DIAGNOSIS — F01518 Vascular dementia, unspecified severity, with other behavioral disturbance: Secondary | ICD-10-CM | POA: Diagnosis present

## 2022-04-23 DIAGNOSIS — E872 Acidosis, unspecified: Secondary | ICD-10-CM | POA: Diagnosis present

## 2022-04-23 DIAGNOSIS — T68XXXS Hypothermia, sequela: Secondary | ICD-10-CM | POA: Diagnosis not present

## 2022-04-23 DIAGNOSIS — Z043 Encounter for examination and observation following other accident: Secondary | ICD-10-CM | POA: Diagnosis not present

## 2022-04-23 DIAGNOSIS — F01A18 Vascular dementia, mild, with other behavioral disturbance: Secondary | ICD-10-CM | POA: Diagnosis not present

## 2022-04-23 DIAGNOSIS — K769 Liver disease, unspecified: Secondary | ICD-10-CM | POA: Diagnosis not present

## 2022-04-23 DIAGNOSIS — Z9049 Acquired absence of other specified parts of digestive tract: Secondary | ICD-10-CM | POA: Diagnosis not present

## 2022-04-23 DIAGNOSIS — M25551 Pain in right hip: Secondary | ICD-10-CM | POA: Diagnosis not present

## 2022-04-23 DIAGNOSIS — R509 Fever, unspecified: Secondary | ICD-10-CM | POA: Diagnosis not present

## 2022-04-23 DIAGNOSIS — F03918 Unspecified dementia, unspecified severity, with other behavioral disturbance: Secondary | ICD-10-CM | POA: Diagnosis not present

## 2022-04-23 DIAGNOSIS — I69398 Other sequelae of cerebral infarction: Secondary | ICD-10-CM | POA: Diagnosis not present

## 2022-04-23 DIAGNOSIS — Z0389 Encounter for observation for other suspected diseases and conditions ruled out: Secondary | ICD-10-CM | POA: Diagnosis not present

## 2022-04-23 DIAGNOSIS — G9341 Metabolic encephalopathy: Secondary | ICD-10-CM | POA: Diagnosis present

## 2022-04-23 DIAGNOSIS — K863 Pseudocyst of pancreas: Secondary | ICD-10-CM | POA: Diagnosis present

## 2022-04-23 DIAGNOSIS — J302 Other seasonal allergic rhinitis: Secondary | ICD-10-CM | POA: Diagnosis present

## 2022-04-23 DIAGNOSIS — F01A0 Vascular dementia, mild, without behavioral disturbance, psychotic disturbance, mood disturbance, and anxiety: Secondary | ICD-10-CM | POA: Diagnosis not present

## 2022-04-23 LAB — CBC WITH DIFFERENTIAL/PLATELET
Abs Immature Granulocytes: 0.02 10*3/uL (ref 0.00–0.07)
Basophils Absolute: 0 10*3/uL (ref 0.0–0.1)
Basophils Relative: 0 %
Eosinophils Absolute: 0.1 10*3/uL (ref 0.0–0.5)
Eosinophils Relative: 2 %
HCT: 38 % (ref 36.0–46.0)
Hemoglobin: 12.1 g/dL (ref 12.0–15.0)
Immature Granulocytes: 0 %
Lymphocytes Relative: 23 %
Lymphs Abs: 1.2 10*3/uL (ref 0.7–4.0)
MCH: 28.7 pg (ref 26.0–34.0)
MCHC: 31.8 g/dL (ref 30.0–36.0)
MCV: 90.3 fL (ref 80.0–100.0)
Monocytes Absolute: 0.3 10*3/uL (ref 0.1–1.0)
Monocytes Relative: 6 %
Neutro Abs: 3.7 10*3/uL (ref 1.7–7.7)
Neutrophils Relative %: 69 %
Platelets: 267 10*3/uL (ref 150–400)
RBC: 4.21 MIL/uL (ref 3.87–5.11)
RDW: 12.8 % (ref 11.5–15.5)
WBC: 5.3 10*3/uL (ref 4.0–10.5)
nRBC: 0 % (ref 0.0–0.2)

## 2022-04-23 LAB — URINE DRUG SCREEN, QUALITATIVE (ARMC ONLY)
Amphetamines, Ur Screen: NOT DETECTED
Barbiturates, Ur Screen: NOT DETECTED
Benzodiazepine, Ur Scrn: POSITIVE — AB
Cannabinoid 50 Ng, Ur ~~LOC~~: NOT DETECTED
Cocaine Metabolite,Ur ~~LOC~~: NOT DETECTED
MDMA (Ecstasy)Ur Screen: NOT DETECTED
Methadone Scn, Ur: NOT DETECTED
Opiate, Ur Screen: NOT DETECTED
Phencyclidine (PCP) Ur S: NOT DETECTED
Tricyclic, Ur Screen: POSITIVE — AB

## 2022-04-23 LAB — LACTIC ACID, PLASMA
Lactic Acid, Venous: 1.5 mmol/L (ref 0.5–1.9)
Lactic Acid, Venous: 1.1 mmol/L (ref 0.5–1.9)

## 2022-04-23 LAB — TSH: TSH: 1.276 u[IU]/mL (ref 0.350–4.500)

## 2022-04-23 LAB — SALICYLATE LEVEL: Salicylate Lvl: <7 mg/dL — ABNORMAL LOW (ref 7.0–30.0)

## 2022-04-23 LAB — CORTISOL: Cortisol, Plasma: 7.4 ug/dL

## 2022-04-23 LAB — MRSA CULTURE

## 2022-04-23 LAB — ACETAMINOPHEN LEVEL: Acetaminophen (Tylenol), Serum: <10 ug/mL — ABNORMAL LOW (ref 10–30)

## 2022-04-23 LAB — AMMONIA: Ammonia: 27 umol/L (ref 9–35)

## 2022-04-23 MED ORDER — SODIUM CHLORIDE 0.9 % IV SOLN
2.0000 g | Freq: Two times a day (BID) | INTRAVENOUS | Status: DC
  Administered 2022-04-23 – 2022-04-24 (×2): 2 g via INTRAVENOUS
  Filled 2022-04-23: qty 12.5
  Filled 2022-04-23: qty 2
  Filled 2022-04-23: qty 12.5

## 2022-04-23 MED ORDER — SODIUM CHLORIDE 0.9 % IV BOLUS: 500.0000 mL | Freq: Once | INTRAVENOUS | Status: DC

## 2022-04-23 MED ORDER — ENOXAPARIN SODIUM 40 MG/0.4ML IJ SOSY
40.0000 mg | PREFILLED_SYRINGE | INTRAMUSCULAR | Status: DC
  Administered 2022-04-23 – 2022-05-01 (×7): 40 mg via SUBCUTANEOUS
  Filled 2022-04-23 (×15): qty 0.4

## 2022-04-23 MED ORDER — VANCOMYCIN HCL IN DEXTROSE 1-5 GM/200ML-% IV SOLN
1000.0000 mg | Freq: Once | INTRAVENOUS | Status: AC
  Administered 2022-04-23: 1000 mg via INTRAVENOUS
  Filled 2022-04-23 (×2): qty 200

## 2022-04-23 MED ORDER — LACTATED RINGERS IV SOLN: INTRAVENOUS | Status: DC

## 2022-04-23 MED ORDER — ONDANSETRON HCL 4 MG PO TABS: 4.0000 mg | ORAL_TABLET | Freq: Four times a day (QID) | ORAL | Status: DC | PRN

## 2022-04-23 MED ORDER — VANCOMYCIN HCL 500 MG/100ML IV SOLN
500.0000 mg | INTRAVENOUS | Status: DC
  Administered 2022-04-24: 500 mg via INTRAVENOUS
  Filled 2022-04-23: qty 100

## 2022-04-23 MED ORDER — LORAZEPAM 2 MG/ML IJ SOLN: 1.0000 mg | Freq: Once | INTRAMUSCULAR | Status: DC | PRN

## 2022-04-23 MED ORDER — ONDANSETRON HCL 4 MG/2ML IJ SOLN: 4.0000 mg | Freq: Four times a day (QID) | INTRAMUSCULAR | Status: DC | PRN

## 2022-04-23 NOTE — Telephone Encounter (Signed)
Will hold off on sending in medication at this time, currently in ER pending admission.

## 2022-04-23 NOTE — H&P (Addendum)
History and Physical    Patient: Madeline Taylor ZGY:[id number] DOB: 25-Feb-1935 DOA: 04/22/2022 DOS: the patient was seen and examined on 04/23/2022 PCP: [name], NP  Patient coming from: Home  Chief Complaint:  Chief Complaint  Patient presents with   Placement   HPI: Madeline Taylor is a 87 y.o. female with medical history significant of dementia, hypertension, history of TIAs presenting with encephalopathy, hypothermia.  Limited history in the setting of encephalopathy.  History primarily from her daughter.  Per report, patient was noted to be living on the poor living conditions as her husband recently went into a nursing facility.  Patient was found to be confused as well as living in substandard conditions.  The daughter states that she has been relatively estranged from her mom for several years.  However, has been trying to take care of her for the past 1 to 2 months.  Patient had recurring episodes of confusion, agitation at home.  Was recently evaluated by a primary care physician.  This was her first visit in approximately 2 years.  Was started on medication for blood pressure per the daughter.  No reports of fevers or chills.  No reports of nausea or vomiting.  No reports of chest pain or shortness of breath.  Noted worsening agitation and confusion. Initially presented to the ER afebrile, BP stable.  Was evaluated by behavioral health with no indication for inpatient evaluation.  Was given some Haldol and Seroquel in the setting of significant agitation.  Imaging was obtained with a CT head showing?  Subacute lacunar infarcts in the left thalamic regions.  Case was discussed with on-call neurologist Dr. Jerrell Belfast who recommended MRI for further evaluation.  While in the ER, patient became fairly hypothermic with temperatures at 96.1.  Bair hugger placed.  Initial white count from yesterday was 7.1, hemoglobin 12.8, Tylenol salicylate level within normal limits, urinalysis stable.  Creatinine  0.73. Review of Systems: As mentioned in the history of present illness. All other systems reviewed and are negative. Past Medical History:  Diagnosis Date   Dementia    Essential hypertension 07/29/2016   History of 2019 novel coronavirus disease (COVID-19) 01/16/2019   History of vertigo 07/29/2016   Hyperlipidemia 07/29/2016   Osteoarthritis of back 07/29/2016   Osteopenia of multiple sites 07/29/2016   Pancreatitis    Seasonal allergies 07/29/2016   Shingles 05/01/2019   Urinary, incontinence, stress female 07/29/2016   Past Surgical History:  Procedure Laterality Date   CHOLECYSTECTOMY     no past surgery     Social History:  reports that she has never smoked. She has never used smokeless tobacco. She reports that she does not drink alcohol and does not use drugs.  Allergies  Allergen Reactions   Lisinopril Cough   Statins     Family History  Problem Relation Age of Onset   Breast cancer Sister    Cancer Sister 36       breast cancer   Alzheimer's disease Mother    Healthy Father     Prior to Admission medications   Medication Sig Start Date End Date Taking? Authorizing Provider  acetaminophen (TYLENOL) 325 MG tablet Take 2 tablets (650 mg total) by mouth every 6 (six) hours as needed for moderate pain. 10/09/21  Yes Wallis Bamberg, PA-C  amLODipine-olmesartan (AZOR) 10-40 MG tablet Take 1 tablet by mouth daily. 03/29/22  Yes [name], NP  aspirin 81 MG EC tablet Take 1 tablet (81 mg total)  by mouth daily. Swallow whole. Patient not taking: Reported on 05/21/2021 11/17/20   Lorre MunroeBaity, Regina W, NP  metoprolol tartrate (LOPRESSOR) 25 MG tablet Take 1 tablet (25 mg total) by mouth 2 (two) times daily. Patient not taking: Reported on 01/24/2019 01/18/19 05/01/19  Enedina FinnerPatel, Sona, MD    Physical Exam: Vitals:   04/23/22 0800 04/23/22 0930 04/23/22 0949 04/23/22 1102  BP: 104/61 120/63    Pulse: 63 63    Resp: 12 12    Temp:   (!) 96.1 F (35.6 C) (!) 96.7 F (35.9 C)   TempSrc:   Rectal Rectal  SpO2: 98% 98%    Weight:      Height:       Physical Exam Constitutional:      General: She is not in acute distress.    Appearance: She is normal weight.  HENT:     Head: Normocephalic and atraumatic.     Mouth/Throat:     Mouth: Mucous membranes are moist.  Eyes:     Conjunctiva/sclera: Conjunctivae normal.  Cardiovascular:     Rate and Rhythm: Normal rate and regular rhythm.  Pulmonary:     Effort: Pulmonary effort is normal.  Abdominal:     General: Bowel sounds are normal.  Musculoskeletal:     Comments: + generalized decreased ROM   Neurological:     Comments: Pt sleeping/lethargic  No gross deficits noted   Psychiatric:        Mood and Affect: Mood normal.     Data Reviewed:  There are no new results to review at this time. CT Cervical Spine Wo Contrast CLINICAL DATA:  87 year old female found down in bathroom.  EXAM: CT CERVICAL SPINE WITHOUT CONTRAST  TECHNIQUE: Multidetector CT imaging of the cervical spine was performed without intravenous contrast. Multiplanar CT image reconstructions were also generated.  RADIATION DOSE REDUCTION: This exam was performed according to the departmental dose-optimization program which includes automated exposure control, adjustment of the mA and/or kV according to patient size and/or use of iterative reconstruction technique.  COMPARISON:  Head CT today.  Brain MRI 03/16/2020.  FINDINGS: Alignment: Mild reversal of cervical lordosis with multilevel mild degenerative appearing upper cervical anterolisthesis C3-C4 and C4-C5. Associated facet degeneration. Similar mild anterolisthesis at C7-T1 with facet degeneration. Bilateral posterior element alignment is within normal limits.  Skull base and vertebrae: Bone mineralization is within normal limits for age. Visualized skull base is intact. No atlanto-occipital dissociation. C1 and C2 appear intact and aligned. No acute osseous  abnormality identified.  Soft tissues and spinal canal: No prevertebral fluid or swelling. No visible canal hematoma. Negative visible noncontrast neck soft tissues, calcified carotid atherosclerosis.  Disc levels: Cervical spine degeneration but mild if any associated cervical spinal stenosis (perhaps at C5-C6 in part due to disc and endplate degeneration. Evidence of developing degenerative interbody ankylosis at that level.  Upper chest: Negative.  IMPRESSION: 1. No acute traumatic injury identified in the cervical spine. 2. Cervical spine degeneration with associated multilevel mild spondylolisthesis. Up to mild associated spinal stenosis.  Electronically Signed   By: Odessa FlemingH  Hall M.D.   On: 04/23/2022 07:16 CT Head Wo Contrast CLINICAL DATA:  87 year old female found down in bathroom.  EXAM: CT HEAD WITHOUT CONTRAST  TECHNIQUE: Contiguous axial images were obtained from the base of the skull through the vertex without intravenous contrast.  RADIATION DOSE REDUCTION: This exam was performed according to the departmental dose-optimization program which includes automated exposure control, adjustment of  the mA and/or kV according to patient size and/or use of iterative reconstruction technique.  COMPARISON:  Head CT yesterday.  Brain MRI 03/16/2020.  FINDINGS: Brain: Largely stable non contrast CT appearance of the brain. No midline shift, mass effect, or evidence of intracranial mass lesion. No acute intracranial hemorrhage identified. Confluent bilateral cerebral white matter hypodensity.  However, rounded hypodensity in the central right thalamus was not apparent yesterday and is new from the 2022 MRI.  No acute intracranial hemorrhage identified. No cortically based acute infarct identified. No ventriculomegaly.  Vascular: Extensive Calcified atherosclerosis at the skull base. No suspicious intracranial vascular hyperdensity. Normal basilar cisterns.  Skull:  Stable, intact.  Sinuses/Orbits: Visualized paranasal sinuses and mastoids are stable and well aerated.  Other: No orbit or scalp soft tissue injury identified.  IMPRESSION: 1. Appearance suspicious for subacute/evolving Lacunar Infarct Of The Left Thalamus, not apparent by CT yesterday. No associated hemorrhage or mass effect.  2. No superimposed or acute intracranial abnormality or acute traumatic injury identified.  Electronically Signed   By: Odessa FlemingH  Hall M.D.   On: 04/23/2022 07:13  Lab Results  Component Value Date   WBC 7.1 04/22/2022   HGB 12.8 04/22/2022   HCT 40.7 04/22/2022   MCV 92.3 04/22/2022   PLT 262 04/22/2022   Last metabolic panel Lab Results  Component Value Date   GLUCOSE 106 (H) 04/22/2022   NA 139 04/22/2022   K 4.1 04/22/2022   CL 106 04/22/2022   CO2 25 04/22/2022   BUN 20 04/22/2022   CREATININE 0.73 04/22/2022   GFRNONAA >60 04/22/2022   CALCIUM 9.2 04/22/2022   PROT 8.3 (H) 04/22/2022   ALBUMIN 4.1 04/22/2022   BILITOT 0.3 04/22/2022   ALKPHOS 59 04/22/2022   AST 21 04/22/2022   ALT 19 04/22/2022   ANIONGAP 8 04/22/2022    Assessment and Plan: * Encephalopathy Patient initially agitated now lethargic at the bedside status post antipsychotic medication including Haldol Suspect likely multifactorial with contributions of baseline dementia with? New CVA Patient also now hypothermic CT head with subacute thalamic lacunar infarct-MRI pending Noted behavior health evaluation-did not meet inpt evaluation criteria  Hold sedating medications Followup MRI- CVA eval if imaging indicative  Will place on broad-spectrum coverage in the setting of hypothermia and borderline blood pressures for infectious coverage pending further evaluation Check ammonia level, TSH, cortisol level  UDS Follow  Hypothermia Tmax 96.5 on evaluation at the bedside Bear hugger in place Will check TSH and cortisol level Systolic blood pressures in the 90s at  present Does not meet full sepsis criteria present.  However, given age and trending vital signs, will place on cefepime and vancomycin for broad-spectrum coverage pending further evaluation De-escalate as appropriate Lactate and repeat CBC pending  Blood and urine cultures Chest x-ray pending Check COVID panel Follow  History of TIA (transient ischemic attack) Prior imaging 12/2020 w/ Multifocal chronic lacunar insults  Pending MRI to assess for any acute changes in setting of encephalopathy  Per Dr.Siadecki, neurology aware of case  F/u MRI- CVA w/u if MRI indicative of acute changes   Essential hypertension LLN BP  Hold BP regimen        Advance Care Planning:   Code Status: Full Code   Consults: Pending neurology evaluation if MRI indicative of CVA   Family Communication: Daughter at the bedside   Severity of Illness: The appropriate patient status for this patient is OBSERVATION. Observation status is judged to be reasonable and necessary in order to  provide the required intensity of service to ensure the patient's safety. The patient's presenting symptoms, physical exam findings, and initial radiographic and laboratory data in the context of their medical condition is felt to place them at decreased risk for further clinical deterioration. Furthermore, it is anticipated that the patient will be medically stable for discharge from the hospital within 2 midnights of admission.   Author: Floydene Flock, MD 04/23/2022 12:07 PM  For on call review [email or web address].

## 2022-04-23 NOTE — BH Assessment (Signed)
Collateral: [name],[name] (Daughter) [phone] Pt's daughter reported concerns about pt's safety due to increasingly aggressive behavior. Daughter reported that the pt is not redirectable. Daughter explained that APS was contacted due to the condition of the pt's home being extremely unkempt/neglected.

## 2022-04-23 NOTE — ED Notes (Addendum)
This RN was notified by ERP that he found pt in the bathroom curled up on the floor while this RN was helping another pt. ERP examined pt and did not find any obvious trauma/wounds. Pt back in bed and bed alarm resounded. Family notified. Charge nurse notified. See orders.

## 2022-04-23 NOTE — ED Notes (Signed)
Pt eating snack and has water. Pt calm and cooperative at this time

## 2022-04-23 NOTE — ED Notes (Signed)
Pt's daughter at bedside. Pt temperature re-checked. Attempted to perform neuro assessment and NIHSS but pt difficult to arouse even with sternal rub. Pt not staying awake to participate. Daughter mentioned letting her rest and not continuing to try and wake pt. MD updated and brought to bedside.

## 2022-04-23 NOTE — Assessment & Plan Note (Signed)
LLN BP  Hold BP regimen

## 2022-04-23 NOTE — ED Provider Notes (Signed)
-----------------------------------------   4:24 AM on 04/23/2022 -----------------------------------------   Blood pressure (!) 155/71, pulse 80, temperature (!) 97.5 F (36.4 C), temperature source Oral, resp. rate 18, height 4\' 11"  (1.499 m), weight 49.4 kg, SpO2 97 %.  The patient is calm and cooperative at this time.  There have been no acute events since the last update.  Awaiting disposition plan from case management/social work.    Lamoyne Hessel, Layla Maw, DO 04/23/22 [phone]

## 2022-04-23 NOTE — Assessment & Plan Note (Signed)
Prior imaging 12/2020 w/ Multifocal chronic lacunar insults  Pending MRI to assess for any acute changes in setting of encephalopathy  Per Dr.Siadecki, neurology aware of case  F/u MRI- CVA w/u if MRI indicative of acute changes

## 2022-04-23 NOTE — Progress Notes (Signed)
Pharmacy Antibiotic Note  Madeline Taylor is a 87 y.o. female w/ PMH of dementia, HTN admitted on 04/22/2022 who was brought to the ED by her neighbor due to confusion and unsafe home situation with sepsis.  Pharmacy has been consulted for vancomycin and cefepime dosing.  Plan:  1) start vancomycin 1000 mg IV x 1 then 500 mg IV every 24 hours Goal AUC 400-550. Expected AUC: 416.5 SCr used: 0.80 mg/dL (rounded up) Ke 2.500 h-1, T1/2 20.4h  2) start cefepime 2 grams IV every 12 hours --follow renal function for needed dose adjustments  Height: 4\' 11"  (149.9 cm) Weight: 49.4 kg (108 lb 14.5 oz) IBW/kg (Calculated) : 43.2  Temp (24hrs), Avg:96.7 F (35.9 C), Min:96.1 F (35.6 C), Max:97.5 F (36.4 C)  Recent Labs  Lab 04/22/22 1726  WBC 7.1  CREATININE 0.73    Estimated Creatinine Clearance: 35.7 mL/min (by C-G formula based on SCr of 0.73 mg/dL).    Allergies  Allergen Reactions   Lisinopril Cough   Statins     Antimicrobials this admission: 04/05 vancomycin >>  04/05 cefepime >>   Microbiology results: 04/05 BCx: pending 04/05 UCx: pending  04/05 MRSA PCR: pending  Thank you for allowing pharmacy to be a part of this patient's care.  Lowella Bandy 04/23/2022 11:54 AM

## 2022-04-23 NOTE — ED Notes (Signed)
Pt stating she needed to go to the bathroom, pt assisted to stand up, pt unable to stand is weak and saying her leg hurt. RN assisted pt into bed with another RN, pt told to go in her brief if she needs to. Pt talking with her eyes closed and mumbling. Bed alarm on and pt by nurses station.

## 2022-04-23 NOTE — Assessment & Plan Note (Addendum)
Tmax 96.5 on evaluation at the bedside Bear hugger in place Will check TSH and cortisol level Systolic blood pressures in the 90s at present Does not meet full sepsis criteria present.  However, given age and trending vital signs, will place on cefepime and vancomycin for broad-spectrum coverage pending further evaluation Lactate and repeat CBC pending  Blood and urine cultures Chest x-ray pending Check COVID panel Follow

## 2022-04-23 NOTE — Assessment & Plan Note (Addendum)
Patient initially agitated now lethargic at the bedside status post antipsychotic medication including Haldol Suspect likely multifactorial with contributions of baseline dementia with? New CVA Patient also now hypothermic CT head with subacute thalamic lacunar infarct-MRI pending Noted behavior health evaluation-did not meet inpt evaluation criteria  Hold sedating medications Followup MRI- CVA eval if imaging indicative  Will place on broad-spectrum coverage in the setting of hypothermia and borderline blood pressures for infectious coverage pending further evaluation Check ammonia level, TSH, cortisol level  UDS Follow

## 2022-04-23 NOTE — ED Notes (Signed)
Pt ambulated to the bathroom with Kennith Center, RN. Pt in bed resting, denies any needs right now

## 2022-04-23 NOTE — Assessment & Plan Note (Addendum)
Resolved

## 2022-04-23 NOTE — ED Notes (Signed)
Pt attempting to get up, pt redirected and got back to sleep.

## 2022-04-23 NOTE — ED Provider Notes (Signed)
-----------------------------------------   6:38 AM on 04/23/2022 -----------------------------------------   Blood pressure (!) 155/71, pulse 80, temperature (!) 97.5 F (36.4 C), temperature source Oral, resp. rate 18, height 1.499 m (4\' 11" ), weight 49.4 kg, SpO2 97 %.  The patient has been difficult to redirect all night.  She keeps trying to get out of bed and walk to the bathroom.  We thought that there was a bed alarm in place, but apparently was either not functional or not active.  The staff was busy taking care of other patients a few minutes ago when I heard the sound of someone falling and moaning.  I found the patient in the corner of the bathroom.  She is not able to provide any history but she had clearly fallen.  No obvious sign of trauma on her extremities or on her head, but given her age and dementia and inability to provide history, I ordered repeat head CT and cervical spine CT.  I carried her back to the stretcher and she is safely back in the bed at this time.   Loleta Rose, MD 04/23/22 [phone]

## 2022-04-23 NOTE — ED Notes (Signed)
Patient transported to MRI 

## 2022-04-23 NOTE — ED Provider Notes (Signed)
-----------------------------------------   9:12 AM on 04/23/2022 -----------------------------------------   I took over care on this patient from Dr. York Cerise.  CT this morning shows a possible new thalamic lacunar infarct.  I consulted Dr. Wilford Corner from neurology who recommended an MRI for further evaluation.  I also consulted Dr. Alvester Morin from the hospitalist service for possible admission.  ----------------------------------------- 11:25 AM on 04/23/2022 -----------------------------------------  MRI is pending.  The patient is a bit more lethargic now although neuroexam remains nonfocal.  She is also slightly hypothermic and has a borderline blood pressure.  I have ordered a fluid bolus and a lactate.  I discussed with Dr. Alvester Morin and requested that he proceed with admission, as even if the MRI is negative I doubt that the patient will be stable for discharge to a nursing facility.   Dionne Bucy, MD 04/23/22 1125

## 2022-04-23 NOTE — ED Notes (Signed)
ED TO INPATIENT HANDOFF REPORT  ED Nurse Name and Phone #: Jen Mow [phone]  S Name/Age/Gender Madeline Taylor 87 y.o. female Room/Bed: ED04A/ED04A  Code Status   Code Status: Full Code  Home/SNF/Other Home previously, was TOC boarder looking for SNF prior to admission for CVA Patient oriented to: self Is this baseline? Yes   Triage Complete: Triage complete  Chief Complaint Encephalopathy [G93.40]  Triage Note Patient arrives with neighbor. Patient has history of dementia and lives with husband.  According to neighbor, husband is currently in a rehabilitation hospital due to an ankle fracture.  Patient has been living with neighbor.  Patient has become more aggressive, wandering, and unable to redirect over the past week. Adult Protective Services has been notified and out to the home twice today.  Neighbor states that when she went into patients home, house was very dirty and refrigerator had rotten food in it.  Patient with recent COVID.  Has recovered well.   Allergies Allergies  Allergen Reactions   Lisinopril Cough   Statins     Level of Care/Admitting Diagnosis ED Disposition     ED Disposition  Admit   Condition  --   Comment  Hospital Area: [facility] [[id number]]  Level of Care: Telemetry Medical [104]  Covid Evaluation: Confirmed COVID Negative  Diagnosis: Encephalopathy [235573]  Admitting Physician: Floydene Flock [[id number]]  Attending Physician: Floydene Flock [id number]  Certification:: I certify this patient will need inpatient services for at least 2 midnights  Estimated Length of Stay: 2          B Medical/Surgery History Past Medical History:  Diagnosis Date   Dementia    Essential hypertension 07/29/2016   History of 2019 novel coronavirus disease (COVID-19) 01/16/2019   History of vertigo 07/29/2016   Hyperlipidemia 07/29/2016   Osteoarthritis of back 07/29/2016   Osteopenia of multiple sites 07/29/2016    Pancreatitis    Seasonal allergies 07/29/2016   Shingles 05/01/2019   Urinary, incontinence, stress female 07/29/2016   Past Surgical History:  Procedure Laterality Date   CHOLECYSTECTOMY     no past surgery       A IV Location/Drains/Wounds Patient Lines/Drains/Airways Status     Active Line/Drains/Airways     Name Placement date Placement time Site Days   Peripheral IV 04/22/22 20 G Right Antecubital 04/22/22  1948  Antecubital  1   Incision (Closed) 01/05/19 Abdomen 01/05/19  1506  -- 1204   Incision - 4 Ports 01/05/19  1521  -- 1204            Intake/Output Last 24 hours No intake or output data in the 24 hours ending 04/23/22 1149  Labs/Imaging Results for orders placed or performed during the hospital encounter of 04/22/22 (from the past 48 hour(s))  Comprehensive metabolic panel     Status: Abnormal   Collection Time: 04/22/22  5:26 PM  Result Value Ref Range   Sodium 139 135 - 145 mmol/L   Potassium 4.1 3.5 - 5.1 mmol/L   Chloride 106 98 - 111 mmol/L   CO2 25 22 - 32 mmol/L   Glucose, Bld 106 (H) 70 - 99 mg/dL    Comment: Glucose reference range applies only to samples taken after fasting for at least 8 hours.   BUN 20 8 - 23 mg/dL   Creatinine, Ser 5.42 0.44 - 1.00 mg/dL   Calcium 9.2 8.9 - 70.6 mg/dL   Total Protein 8.3 (H) 6.5 - 8.1 g/dL  Albumin 4.1 3.5 - 5.0 g/dL   AST 21 15 - 41 U/L   ALT 19 0 - 44 U/L   Alkaline Phosphatase 59 38 - 126 U/L   Total Bilirubin 0.3 0.3 - 1.2 mg/dL   GFR, Estimated >16>60 >10>60 mL/min    Comment: (NOTE) Calculated using the CKD-EPI Creatinine Equation (2021)    Anion gap 8 5 - 15    Comment: Performed at [facility], [address] [place], [place] [address]  CBC     Status: None   Collection Time: 04/22/22  5:26 PM  Result Value Ref Range   WBC 7.1 4.0 - 10.5 K/uL   RBC 4.41 3.87 - 5.11 MIL/uL   Hemoglobin 12.8 12.0 - 15.0 g/dL   HCT 40.940.7 81.136.0 - 91.446.0 %   MCV 92.3 80.0 - 100.0 fL   MCH 29.0 26.0 -  34.0 pg   MCHC 31.4 30.0 - 36.0 g/dL   RDW 78.212.6 95.611.5 - 21.315.5 %   Platelets 262 150 - 400 K/uL   nRBC 0.0 0.0 - 0.2 %    Comment: Performed at [facility], [address] [place], [place] [address]  CBG monitoring, ED     Status: Abnormal   Collection Time: 04/22/22  5:33 PM  Result Value Ref Range   Glucose-Capillary 100 (H) 70 - 99 mg/dL    Comment: Glucose reference range applies only to samples taken after fasting for at least 8 hours.  Urinalysis, Routine w reflex microscopic -Urine, Clean Catch     Status: Abnormal   Collection Time: 04/22/22  7:54 PM  Result Value Ref Range   Color, Urine STRAW (A) YELLOW   APPearance CLEAR (A) CLEAR   Specific Gravity, Urine 1.011 1.005 - 1.030   pH 6.0 5.0 - 8.0   Glucose, UA NEGATIVE NEGATIVE mg/dL   Hgb urine dipstick NEGATIVE NEGATIVE   Bilirubin Urine NEGATIVE NEGATIVE   Ketones, ur NEGATIVE NEGATIVE mg/dL   Protein, ur NEGATIVE NEGATIVE mg/dL   Nitrite NEGATIVE NEGATIVE   Leukocytes,Ua NEGATIVE NEGATIVE    Comment: Performed at [facility], [address] [place], [place] [address]  Acetaminophen level     Status: Abnormal   Collection Time: 04/23/22  3:10 AM  Result Value Ref Range   Acetaminophen (Tylenol), Serum <10 (L) 10 - 30 ug/mL    Comment: (NOTE) Therapeutic concentrations vary significantly. A range of 10-30 ug/mL  may be an effective concentration for many patients. However, some  are best treated at concentrations outside of this range. Acetaminophen concentrations >150 ug/mL at 4 hours after ingestion  and >50 ug/mL at 12 hours after ingestion are often associated with  toxic reactions.  Performed at [facility], [address] [place], [place] [address]   Salicylate level     Status: Abnormal   Collection Time: 04/23/22  3:10 AM  Result Value Ref Range   Salicylate Lvl <7.0 (L) 7.0 - 30.0 mg/dL    Comment: Performed at [facility], [address] [place],  [place] [address]   CT Cervical Spine Wo Contrast  Result Date: 04/23/2022 CLINICAL DATA:  87 year old female found down in bathroom. EXAM: CT CERVICAL SPINE WITHOUT CONTRAST TECHNIQUE: Multidetector CT imaging of the cervical spine was performed without intravenous contrast. Multiplanar CT image reconstructions were also generated. RADIATION DOSE REDUCTION: This exam was performed according to the departmental dose-optimization program which includes automated exposure control, adjustment of the mA and/or kV according to  patient size and/or use of iterative reconstruction technique. COMPARISON:  Head CT today.  Brain MRI 03/16/2020. FINDINGS: Alignment: Mild reversal of cervical lordosis with multilevel mild degenerative appearing upper cervical anterolisthesis C3-C4 and C4-C5. Associated facet degeneration. Similar mild anterolisthesis at C7-T1 with facet degeneration. Bilateral posterior element alignment is within normal limits. Skull base and vertebrae: Bone mineralization is within normal limits for age. Visualized skull base is intact. No atlanto-occipital dissociation. C1 and C2 appear intact and aligned. No acute osseous abnormality identified. Soft tissues and spinal canal: No prevertebral fluid or swelling. No visible canal hematoma. Negative visible noncontrast neck soft tissues, calcified carotid atherosclerosis. Disc levels: Cervical spine degeneration but mild if any associated cervical spinal stenosis (perhaps at C5-C6 in part due to disc and endplate degeneration. Evidence of developing degenerative interbody ankylosis at that level. Upper chest: Negative. IMPRESSION: 1. No acute traumatic injury identified in the cervical spine. 2. Cervical spine degeneration with associated multilevel mild spondylolisthesis. Up to mild associated spinal stenosis. Electronically Signed   By: Odessa Fleming M.D.   On: 04/23/2022 07:16   CT Head Wo Contrast  Result Date: 04/23/2022 CLINICAL DATA:  87 year old female found down  in bathroom. EXAM: CT HEAD WITHOUT CONTRAST TECHNIQUE: Contiguous axial images were obtained from the base of the skull through the vertex without intravenous contrast. RADIATION DOSE REDUCTION: This exam was performed according to the departmental dose-optimization program which includes automated exposure control, adjustment of the mA and/or kV according to patient size and/or use of iterative reconstruction technique. COMPARISON:  Head CT yesterday.  Brain MRI 03/16/2020. FINDINGS: Brain: Largely stable non contrast CT appearance of the brain. No midline shift, mass effect, or evidence of intracranial mass lesion. No acute intracranial hemorrhage identified. Confluent bilateral cerebral white matter hypodensity. However, rounded hypodensity in the central right thalamus was not apparent yesterday and is new from the 2022 MRI. No acute intracranial hemorrhage identified. No cortically based acute infarct identified. No ventriculomegaly. Vascular: Extensive Calcified atherosclerosis at the skull base. No suspicious intracranial vascular hyperdensity. Normal basilar cisterns. Skull: Stable, intact. Sinuses/Orbits: Visualized paranasal sinuses and mastoids are stable and well aerated. Other: No orbit or scalp soft tissue injury identified. IMPRESSION: 1. Appearance suspicious for subacute/evolving Lacunar Infarct Of The Left Thalamus, not apparent by CT yesterday. No associated hemorrhage or mass effect. 2. No superimposed or acute intracranial abnormality or acute traumatic injury identified. Electronically Signed   By: Odessa Fleming M.D.   On: 04/23/2022 07:13   CT Head Wo Contrast  Result Date: 04/22/2022 CLINICAL DATA:  87 year old female with mental status change EXAM: CT HEAD WITHOUT CONTRAST TECHNIQUE: Contiguous axial images were obtained from the base of the skull through the vertex without intravenous contrast. RADIATION DOSE REDUCTION: This exam was performed according to the departmental dose-optimization  program which includes automated exposure control, adjustment of the mA and/or kV according to patient size and/or use of iterative reconstruction technique. COMPARISON:  MRI head 03/16/2020 and CT head 03/16/2020 FINDINGS: Brain: No intracranial hemorrhage, mass effect, or evidence of acute infarct. No hydrocephalus. No extra-axial fluid collection. Generalized cerebral atrophy. Ill-defined hypoattenuation within the cerebral white matter is nonspecific but consistent with chronic small vessel ischemic disease. Vascular: No hyperdense vessel. Intracranial arterial calcification. Skull: No fracture or focal lesion. Sinuses/Orbits: No acute finding. Paranasal sinuses and mastoid air cells are well aerated. Other: None. IMPRESSION: 1. No acute intracranial abnormality. 2. Chronic small vessel ischemic disease and cerebral atrophy. Electronically Signed   By: Joselyn Glassman  Stutzman M.D.   On: 04/22/2022 20:37    Pending Labs Unresulted Labs (From admission, onward)     Start     Ordered   04/24/22 0500  Comprehensive metabolic panel  Tomorrow morning,   STAT        04/23/22 1141   04/24/22 0500  CBC  Tomorrow morning,   STAT        04/23/22 1141   04/23/22 1149  Urine Drug Screen, Qualitative ([facility] only)  Once,   R        04/23/22 1148   04/23/22 1143  CBC with Differential/Platelet  ONCE - STAT,   R        04/23/22 1142   04/23/22 1142  Cortisol  Once,   R        04/23/22 1141   04/23/22 1140  TSH  Once,   URGENT        04/23/22 1141   04/23/22 1119  Lactic acid, plasma  Now then every 2 hours,   STAT      04/23/22 1118            Vitals/Pain Today's Vitals   04/23/22 0800 04/23/22 0930 04/23/22 0949 04/23/22 1102  BP: 104/61 120/63    Pulse: 63 63    Resp: 12 12    Temp:   (!) 96.1 F (35.6 C) (!) 96.7 F (35.9 C)  TempSrc:   Rectal Rectal  SpO2: 98% 98%    Weight:      Height:        Isolation Precautions No active isolations  Medications Medications  ibuprofen (ADVIL) tablet  600 mg (600 mg Oral Given 04/23/22 0313)  alum & mag hydroxide-simeth (MAALOX/MYLANTA) 200-200-20 MG/5ML suspension 30 mL (has no administration in time range)  QUEtiapine (SEROQUEL) tablet 200 mg (200 mg Oral Given 04/23/22 0013)  haloperidol (HALDOL) tablet 1 mg (0 mg Oral Hold 04/23/22 0901)  LORazepam (ATIVAN) injection 1 mg (has no administration in time range)  sodium chloride 0.9 % bolus 500 mL (has no administration in time range)  enoxaparin (LOVENOX) injection 40 mg (has no administration in time range)  ondansetron (ZOFRAN) tablet 4 mg (has no administration in time range)    Or  ondansetron (ZOFRAN) injection 4 mg (has no administration in time range)  lactated ringers infusion (has no administration in time range)  haloperidol lactate (HALDOL) injection 2.5 mg (2.5 mg Intravenous Given 04/22/22 2014)  OLANZapine (ZYPREXA) tablet 5 mg (5 mg Oral Given 04/23/22 0313)    Mobility Walks with maximum assist. Unsteady on her feet.      Focused Assessments Neuro Assessment Handoff:  Swallow screen pass?  Unable to complete d/t pt being too lethargic. Keep NPO until pass   NIH Stroke Scale  Dizziness Present: No Headache Present: No Interval: Initial Level of Consciousness (1a.)   : Not alert, requires repeated stimulation to attend, or is obtunded and requires strong or painful stimulation to make movements (not stereotyped) LOC Questions (1b. )   : Answers both questions correctly LOC Commands (1c. )   : Performs both tasks correctly Best Gaze (2. )  : Normal Visual (3. )  : No visual loss Facial Palsy (4. )    : Normal symmetrical movements Motor Arm, Left (5a. )   : No drift Motor Arm, Right (5b. ) : No drift Motor Leg, Left (6a. )  : No drift Motor Leg, Right (6b. ) : No drift Limb Ataxia (7. ): Present in  one limb Sensory (8. )  : Normal, no sensory loss Best Language (9. )  : Mild-to-moderate aphasia Dysarthria (10. ): Mild-to-moderate dysarthria, patient slurs at least  some words and, at worst, can be understood with some difficulty Extinction/Inattention (11.)   : No Abnormality Complete NIHSS TOTAL: 5     Neuro Assessment: Exceptions to WDL Neuro Checks:   Initial (04/23/22 0745)  Has TPA been given? No If patient is a Neuro Trauma and patient is going to OR before floor call report to 4N Charge nurse: [phone] or [phone]   R Recommendations: See Admitting Provider Note  Report given to:   Additional Notes:

## 2022-04-24 ENCOUNTER — Inpatient Hospital Stay: Payer: Medicare Other

## 2022-04-24 DIAGNOSIS — M25551 Pain in right hip: Secondary | ICD-10-CM | POA: Diagnosis not present

## 2022-04-24 DIAGNOSIS — F01B11 Vascular dementia, moderate, with agitation: Secondary | ICD-10-CM | POA: Diagnosis not present

## 2022-04-24 DIAGNOSIS — I10 Essential (primary) hypertension: Secondary | ICD-10-CM | POA: Diagnosis not present

## 2022-04-24 DIAGNOSIS — T68XXXA Hypothermia, initial encounter: Secondary | ICD-10-CM | POA: Diagnosis not present

## 2022-04-24 DIAGNOSIS — F03918 Unspecified dementia, unspecified severity, with other behavioral disturbance: Secondary | ICD-10-CM | POA: Diagnosis not present

## 2022-04-24 DIAGNOSIS — E872 Acidosis, unspecified: Secondary | ICD-10-CM | POA: Insufficient documentation

## 2022-04-24 LAB — COMPREHENSIVE METABOLIC PANEL
ALT: 15 U/L (ref 0–44)
AST: 17 U/L (ref 15–41)
Albumin: 3.3 g/dL — ABNORMAL LOW (ref 3.5–5.0)
Alkaline Phosphatase: 53 U/L (ref 38–126)
Anion gap: 11 (ref 5–15)
BUN: 28 mg/dL — ABNORMAL HIGH (ref 8–23)
CO2: 21 mmol/L — ABNORMAL LOW (ref 22–32)
Calcium: 8.7 mg/dL — ABNORMAL LOW (ref 8.9–10.3)
Chloride: 108 mmol/L (ref 98–111)
Creatinine, Ser: 0.78 mg/dL (ref 0.44–1.00)
GFR, Estimated: >60 mL/min (ref >60)
Glucose, Bld: 94 mg/dL (ref 70–99)
Potassium: 4.3 mmol/L (ref 3.5–5.1)
Sodium: 140 mmol/L (ref 135–145)
Total Bilirubin: 0.5 mg/dL (ref 0.3–1.2)
Total Protein: 6.7 g/dL (ref 6.5–8.1)

## 2022-04-24 LAB — RESPIRATORY PANEL BY PCR

## 2022-04-24 LAB — CULTURE, BLOOD (ROUTINE X 2)
Culture: NO GROWTH
Special Requests: ADEQUATE

## 2022-04-24 LAB — CBC
HCT: 34.8 % — ABNORMAL LOW (ref 36.0–46.0)
Hemoglobin: 11.1 g/dL — ABNORMAL LOW (ref 12.0–15.0)
MCH: 29.5 pg (ref 26.0–34.0)
MCHC: 31.9 g/dL (ref 30.0–36.0)
MCV: 92.6 fL (ref 80.0–100.0)
Platelets: 239 10*3/uL (ref 150–400)
RBC: 3.76 MIL/uL — ABNORMAL LOW (ref 3.87–5.11)
RDW: 12.9 % (ref 11.5–15.5)
WBC: 6.9 10*3/uL (ref 4.0–10.5)
nRBC: 0 % (ref 0.0–0.2)

## 2022-04-24 LAB — URINE CULTURE: Culture: NO GROWTH

## 2022-04-24 LAB — PROCALCITONIN: Procalcitonin: <0.1 ng/mL

## 2022-04-24 MED ORDER — SODIUM BICARBONATE 650 MG PO TABS
650.0000 mg | ORAL_TABLET | Freq: Two times a day (BID) | ORAL | Status: AC
  Administered 2022-04-24 – 2022-04-26 (×5): 650 mg via ORAL
  Filled 2022-04-24 (×5): qty 1

## 2022-04-24 MED ORDER — HALOPERIDOL LACTATE 5 MG/ML IJ SOLN
1.0000 mg | Freq: Four times a day (QID) | INTRAMUSCULAR | Status: DC | PRN
  Administered 2022-05-01: 1 mg via INTRAVENOUS
  Filled 2022-04-24 (×4): qty 1

## 2022-04-24 MED ORDER — QUETIAPINE FUMARATE 25 MG PO TABS
25.0000 mg | ORAL_TABLET | Freq: Every day | ORAL | Status: DC
  Administered 2022-04-24 – 2022-07-18 (×82): 25 mg via ORAL
  Filled 2022-04-24 (×87): qty 1

## 2022-04-24 NOTE — Evaluation (Signed)
Physical Therapy Evaluation Patient Details Name: Madeline Taylor MRN: [id number] DOB: [birth year] Today's Date: 04/24/2022  History of Present Illness  Pt is an 87 y/o F admitted on 04/22/22 after presenting with encephalopathy & hypothermia. Pt had COVID about a week ago with worsening behavior since then, as well as sleep deprivation. PMH: dementia, HTN, TIAs  Clinical Impression  Pt seen for PT evaluation with co-tx with OT 2/2 pt's impaired cognition. PT also contacted pt's daughter Madeline Taylor(Madeline Taylor) via telephone re: pt's PLOF & home set up. On this date, pt is pleasant & follows simple commands throughout session but demonstrates impaired overall awareness & safety awareness. Pt requires min assist to ambulate without RW which is different from her baseline. Provided pt with RW & pt ambulates with supervision with ongoing cuing re: proper use of AD. Pt also c/o R hip pain with mobility - team notified. At this time, recommend ongoing PT services to address balance, strengthening, & gait with LRAD to reduce fall risk.   Recommendations for follow up therapy are one component of a multi-disciplinary discharge planning process, led by the attending physician.  Recommendations may be updated based on patient status, additional functional criteria and insurance authorization.  Follow Up Recommendations Can patient physically be transported by private vehicle: Yes     Assistance Recommended at Discharge Frequent or constant Supervision/Assistance  Patient can return home with the following  A little help with walking and/or transfers;A little help with bathing/dressing/bathroom;Assistance with cooking/housework;Assist for transportation;Help with stairs or ramp for entrance;Direct supervision/assist for medications management;Direct supervision/assist for financial management    Equipment Recommendations Rolling walker (2 wheels)  Recommendations for Other Services       Functional Status Assessment  Patient has had a recent decline in their functional status and demonstrates the ability to make significant improvements in function in a reasonable and predictable amount of time.     Precautions / Restrictions Precautions Precautions: Fall Restrictions Weight Bearing Restrictions: No      Mobility  Bed Mobility Overal bed mobility: Modified Independent             General bed mobility comments: supine>sit with HOB elevated, bed rails PRN    Transfers Overall transfer level: Needs assistance Equipment used: None Transfers: Sit to/from Stand Sit to Stand: Min guard                Ambulation/Gait Ambulation/Gait assistance: Min assist, Supervision Gait Distance (Feet): 170 Feet Assistive device: None, Rolling walker (2 wheels) Gait Pattern/deviations: Step-through pattern Gait velocity: slightly decreased     General Gait Details: Pt requires min assist to ambulate without AD as pt with impaired balance & reaching for furniture for support. Provided pt with RW & pt ambulates with close supervision with ongoing cuing re: need to ambulate within base of AD but pt requires ongoing cuing.  Stairs            Wheelchair Mobility    Modified Rankin (Stroke Patients Only)       Balance Overall balance assessment: Needs assistance   Sitting balance-Leahy Scale: Good     Standing balance support: No upper extremity supported, During functional activity Standing balance-Leahy Scale: Poor                               Pertinent Vitals/Pain Pain Assessment Pain Assessment: Faces Faces Pain Scale: Hurts a little bit Pain Location: R hip with gait Pain Descriptors /  Indicators: Discomfort Pain Intervention(s): Monitored during session (MD notified of pt's c/o)    Home Living Family/patient expects to be discharged to:: Private residence Living Arrangements: Alone   Type of Home: House Home Access: Stairs to enter Entrance Stairs-Rails:  None Entrance Stairs-Number of Steps: 2   Home Layout: One level   Additional Comments: Pt lives with spouse who is currently in rehab 2/2 ankle fx, estranged daughter Madeline Taylor(Madeline Taylor) made aware of this & now staying with pt to assist.    Prior Function               Mobility Comments: Ambulatory without AD, pt notes 1 fall in past. ADLs Comments: Daughter reports pt is becoming more difficult to redirect.     Hand Dominance        Extremity/Trunk Assessment   Upper Extremity Assessment Upper Extremity Assessment: Overall WFL for tasks assessed    Lower Extremity Assessment Lower Extremity Assessment: Generalized weakness       Communication   Communication: No difficulties  Cognition Arousal/Alertness: Awake/alert Behavior During Therapy: WFL for tasks assessed/performed Overall Cognitive Status: History of cognitive impairments - at baseline                                 General Comments: Pleasant, decreased safety awareness & overall awareness, oriented to self & "Hospital", follows simple commands during sessino.        General Comments      Exercises     Assessment/Plan    PT Assessment Patient needs continued PT services  PT Problem List Decreased strength;Cardiopulmonary status limiting activity;Decreased activity tolerance;Decreased balance;Decreased mobility;Decreased knowledge of use of DME       PT Treatment Interventions DME instruction;Therapeutic exercise;Balance training;Gait training;Stair training;Neuromuscular re-education;Functional mobility training;Patient/family education;Therapeutic activities    PT Goals (Current goals can be found in the Care Plan section)  Acute Rehab PT Goals Patient Stated Goal: none stated PT Goal Formulation: With patient Time For Goal Achievement: 05/08/22 Potential to Achieve Goals: Fair    Frequency Min 2X/week     Co-evaluation PT/OT/SLP Co-Evaluation/Treatment: Yes Reason for  Co-Treatment: Necessary to address cognition/behavior during functional activity;For patient/therapist safety;To address functional/ADL transfers PT goals addressed during session: Mobility/safety with mobility;Balance;Proper use of DME         AM-PAC PT "6 Clicks" Mobility  Outcome Measure Help needed turning from your back to your side while in a flat bed without using bedrails?: None Help needed moving from lying on your back to sitting on the side of a flat bed without using bedrails?: None Help needed moving to and from a bed to a chair (including a wheelchair)?: A Little Help needed standing up from a chair using your arms (e.g., wheelchair or bedside chair)?: A Little Help needed to walk in hospital room?: A Little Help needed climbing 3-5 steps with a railing? : A Little 6 Click Score: 20    End of Session Equipment Utilized During Treatment: Gait belt Activity Tolerance: Patient tolerated treatment well Patient left: in chair;with call bell/phone within reach;with chair alarm set Nurse Communication: Mobility status PT Visit Diagnosis: Muscle weakness (generalized) (M62.81);Unsteadiness on feet (R26.81);Difficulty in walking, not elsewhere classified (R26.2)    Time: [id number] PT Time Calculation (min) (ACUTE ONLY): 20 min   Charges:   PT Evaluation $PT Eval Moderate Complexity: 1 Mod          Aleda GranaVictoria Harlee Pursifull, PT, DPT 04/24/22, 1:57 PM  Sandi Mariscal 04/24/2022, 1:54 PM

## 2022-04-24 NOTE — Progress Notes (Signed)
Patient is oriented x2 to person and place. Does speak and understand English but has a heavy accent. Complained of r-leg pain upon shift start and was given an ibuprofen. Patient was alert before med pass but got up out of bed very lethargic and more confused this morning. Seroquel dosage may need reassessment. Patient slept most of the night. Midnight temp was 98.1.

## 2022-04-24 NOTE — Progress Notes (Signed)
Got patient up to use the restroom due to her not getting up any last night to urinate. Found her pull-up to be soaked and bed pad wet. In an attempt to get patient up, she dug her nails into my hand and became agitated. Redirected patient verbally and she calmed down. She wanted to go to bathroom and not use bedside commode but her gait was very unsteady. Got her to bsc in which she urinated into the bedside commode 100 mls and then stood up before she was finished and urinated onto floor. Cleaned patient and floor. Changed bed pad and placed patient back into bed. Placed bed alarm on.

## 2022-04-24 NOTE — Hospital Course (Addendum)
87 y.o. female with medical history significant of dementia, hypertension, history of TIAs presenting with encephalopathy, hypothermia  Per daughter, patient had worsening dementia, frequently Lost walking out of the house.  But normally has good appetite.  For the last week, patient had significant increased agitation, has not been sleeping well. She also had a positive COVID about a week ago, repeat COVID was negative at admission. Patient is seen by PT/OT, no need for nursing home placement.  However unsafe discharge.  The patient has vascular dementia and does not have the ability to make medical decisions or sign paperwork for herself at this time. Evening of 4/16, patient had a fall out of bed and has bruising left frontal and around the left eye. 4/27 in the evening spiked a fever of 102.  Blood cultures positive for E. coli and Started empirically on antibiotics and IV fluids.   4/29.  CT scan shows a pancreatic pseudocyst.  Tumor markers negative.  DSS is legal guardian now. Patient is finally able to be placed.

## 2022-04-24 NOTE — Progress Notes (Signed)
  Progress Note   Patient: AZAYLIA SKENANDORE GNF:[id number] DOB: 03-06-1935 DOA: 04/22/2022     1 DOS: the patient was seen and examined on 04/24/2022   Brief hospital course:  SHANEEN HADDOX is a 87 y.o. female with medical history significant of dementia, hypertension, history of TIAs presenting with encephalopathy, hypothermia  Per daughter, patient had worsening dementia, frequently Lost walking out of the house.  But normally has good appetite.  For the last week, patient had significant increased agitation, has not been sleeping well. She also had a positive COVID about a week ago, repeat COVID was negative at admission.    Principal Problem:   Encephalopathy Active Problems:   Hypothermia   Essential hypertension   Osteoarthritis of multiple joints   Vascular dementia   History of TIA (transient ischemic attack)   Metabolic acidosis   Dementia with behavioral disturbance   Assessment and Plan: Dementia with delirium. Vascular dementia. History of TIA. Patient has significant dementia, which is progressively getting worse.  She had COVID about a week ago, worsening behavior since then.  Patient also has sleep deprivation. Patient appears to have good appetite at home, no need for fluids. I will start lower dose Seroquel at 25 mg every evening, start as needed Haldol for agitation.  Avoid benzodiazepine. Will also obtain PT/OT evaluation. Patient had a CT head, initially suspect lacunar infarct, MRI of the brain did not show any stroke.  Altered mental status unlikely due to stroke.  Hypothermia Patient present with a low temperature at time of admission, which has quickly resolved.  TSH and cortisol level are within normal limits.  Patient does not have any evidence of infection.  I will discontinue all antibiotics.  Essential hypertension Blood pressure medicine on hold, blood pressure not elevated.       Subjective:  Patient is confused, no agitation today.  Physical  Exam: Vitals:   04/23/22 1245 04/23/22 1347 04/23/22 2341 04/24/22 0841  BP: 108/62 117/62 (!) 121/55 130/67  Pulse: 60 60 69 63  Resp: 17 16 16 17   Temp: 97.8 F (36.6 C) 97.9 F (36.6 C) 98.1 F (36.7 C) 97.6 F (36.4 C)  TempSrc: Axillary   Oral  SpO2: 95% 94% 99% 99%  Weight:      Height:       General exam: Appears calm and comfortable  Respiratory system: Clear to auscultation. Respiratory effort normal. Cardiovascular system: S1 & S2 heard, RRR. No JVD, murmurs, rubs, gallops or clicks. No pedal edema. Gastrointestinal system: Abdomen is nondistended, soft and nontender. No organomegaly or masses felt. Normal bowel sounds heard. Central nervous system: Alert and oriented x2. No focal neurological deficits. Extremities: Symmetric 5 x 5 power. Skin: No rashes, lesions or ulcers Psychiatry: Flat affect.   Data Reviewed:    Family Communication: Daughter updated at bedside.  Disposition: Status is: Inpatient  Remains inpatient appropriate because: Severity of disease, altered mental status.     Time spent: 55 minutes  Author: Marrion Coy, MD 04/24/2022 12:18 PM  For on call review [email or web address].

## 2022-04-24 NOTE — Evaluation (Signed)
Occupational Therapy Evaluation Patient Details Name: Madeline Taylor T Kahrs MRN: [id number] DOB: [birth year] Today's Date: 04/24/2022   History of Present Illness Pt is an 87 y/o F admitted on 04/22/22 after presenting with encephalopathy & hypothermia. Pt had COVID about a week ago with worsening behavior since then, as well as sleep deprivation. PMH: dementia, HTN, TIAs   Clinical Impression   Patient received for OT evaluation. See flowsheet below for details of function. Generally, patient requiring SBA for bed mobility, CGA for functional mobility without RW, SBA for functional mobility with RW (c/o hip pain on R), and MIN A-CGA for ADLs. Patient will benefit from continued OT while in acute care.      Recommendations for follow up therapy are one component of a multi-disciplinary discharge planning process, led by the attending physician.  Recommendations may be updated based on patient status, additional functional criteria and insurance authorization.   Assistance Recommended at Discharge Frequent or constant Supervision/Assistance  Patient can return home with the following A little help with walking and/or transfers;A little help with bathing/dressing/bathroom;Assistance with cooking/housework;Direct supervision/assist for medications management;Direct supervision/assist for financial management;Assist for transportation;Help with stairs or ramp for entrance    Functional Status Assessment  Patient has had a recent decline in their functional status and demonstrates the ability to make significant improvements in function in a reasonable and predictable amount of time.  Equipment Recommendations  Other (comment) (defer to next venue of care)    Recommendations for Other Services       Precautions / Restrictions Precautions Precautions: Fall Restrictions Weight Bearing Restrictions: No      Mobility Bed Mobility Overal bed mobility: Modified Independent             General bed  mobility comments: HOB elevated, slight use of rails, supine to sit    Transfers Overall transfer level: Needs assistance Equipment used: None Transfers: Sit to/from Stand Sit to Stand: Min guard           General transfer comment: OT used gait belt for safety      Balance Overall balance assessment: Needs assistance (Simultaneous filing. User may not have seen previous data.)                                         ADL either performed or assessed with clinical judgement   ADL Overall ADL's : Needs assistance/impaired Eating/Feeding: Set up;Sitting   Grooming: Wash/dry hands;Standing;Min guard Grooming Details (indicate cue type and reason): Pt able to wash hands; needing cues to find the soap. CGA for safety in standing             Lower Body Dressing: Supervision/safety;Set up;Sit to/from stand Lower Body Dressing Details (indicate cue type and reason): pt donning socks and shoes prior to mobilizing; set up; cues needed to tie shoes; shows good LE flexibility Toilet Transfer: Teacher, early years/preMin guard;Regular Toilet Toilet Transfer Details (indicate cue type and reason): CGA for safety Toileting- Clothing Manipulation and Hygiene: Min guard;Cueing for safety;Sit to/from stand Toileting - Clothing Manipulation Details (indicate cue type and reason): Pt denying that her brief is wet until OT insists that it is, then pt willing to change brief; high risk for self-neglect and infection due to decreased safety awareness.     Functional mobility during ADLs: Min guard;Supervision/safety General ADL Comments: Pt requiring CGA for mobility without RW in bathroom today; improved balance with RW,  as pt reaching out for walls/doorways with standing/walking without assistive device.     Vision         Perception     Praxis      Pertinent Vitals/Pain Pain Assessment Pain Assessment: 0-10 (Pt able to verbalize that she was having R hip pain with walking) Pain Location:  R hip with gait Pain Descriptors / Indicators: Discomfort Pain Intervention(s): Monitored during session     Hand Dominance  (Pt using R hand for hygiene today)   Extremity/Trunk Assessment Upper Extremity Assessment Upper Extremity Assessment: Overall WFL for tasks assessed   Lower Extremity Assessment Lower Extremity Assessment: Generalized weakness       Communication Communication Communication: No difficulties   Cognition Arousal/Alertness: Awake/alert Behavior During Therapy: WFL for tasks assessed/performed Overall Cognitive Status: History of cognitive impairments - at baseline                                 General Comments: Daughter states that pt is typically oriented to self only. Today pt is able to state her name and that she's at the hospital. Thinks that it is May; does not know the year. Is oriented to the fact that she's 87 years old, [place] and [place].     General Comments  Pt calm and pleasant during OT session today; medical record indicates fluctuating affect.    Exercises     Shoulder Instructions      Home Living Family/patient expects to be discharged to:: Private residence Living Arrangements: Alone (spouse currently in rehab; also concerns for his executive function, per daughter (via SLP in rehab)) Available Help at Discharge: Enos Flingeighbor (neighbor is who called EMS for pt unsafe conditions and increased confusion.) Type of Home: House Home Access: Stairs to enter Entergy CorporationEntrance Stairs-Number of Steps: 2 Entrance Stairs-Rails: None Home Layout: One level     Bathroom Shower/Tub: Tub/shower unit;Walk-in shower   Bathroom Toilet: Standard     Home Equipment:  (unknown)   Additional Comments: Pt lives with spouse who is currently in rehab 2/2 ankle fx, estranged daughter [name] made aware of this & now staying with pt to assist.      Prior Functioning/Environment Prior Level of Function :  Needs assist  Cognitive Assist : ADLs (cognitive)   ADLs (Cognitive):  (pt with dementia; needing assist with IADLs and recently increased supervision 2/2 wandering)       Mobility Comments: Ambulatory without AD, pt notes 1 fall in past, but unable to provide details or exact timing. ED notes state that pt had a fall while in the ED; pt does not mention this. ADLs Comments: Daughter reports pt is becoming more difficult to redirect.        OT Problem List: Decreased activity tolerance;Impaired balance (sitting and/or standing);Decreased cognition;Decreased safety awareness;Decreased knowledge of use of DME or AE      OT Treatment/Interventions: Self-care/ADL training;Therapeutic exercise;Cognitive remediation/compensation;Therapeutic activities    OT Goals(Current goals can be found in the care plan section) Acute Rehab OT Goals Patient Stated Goal: Get better OT Goal Formulation: Patient unable to participate in goal setting Time For Goal Achievement: 05/08/22 Potential to Achieve Goals: Good ADL Goals Pt Will Perform Lower Body Dressing: with modified independence;sit to/from stand Pt Will Transfer to Toilet: with modified independence;ambulating Pt Will Perform Toileting - Clothing Manipulation and hygiene: with modified independence;sit to/from stand  OT Frequency: Min  2X/week    Co-evaluation PT/OT/SLP Co-Evaluation/Treatment: Yes Reason for Co-Treatment: Necessary to address cognition/behavior during functional activity;For patient/therapist safety;To address functional/ADL transfers PT goals addressed during session: Mobility/safety with mobility;Balance;Proper use of DME OT goals addressed during session: ADL's and self-care      AM-PAC OT "6 Clicks" Daily Activity     Outcome Measure Help from another person eating meals?: None Help from another person taking care of personal grooming?: A Little Help from another person toileting, which includes using toliet, bedpan,  or urinal?: A Little Help from another person bathing (including washing, rinsing, drying)?: A Little Help from another person to put on and taking off regular upper body clothing?: None Help from another person to put on and taking off regular lower body clothing?: A Little 6 Click Score: 20   End of Session Equipment Utilized During Treatment: Gait belt;Rolling walker (2 wheels) Nurse Communication: Mobility status  Activity Tolerance: Patient tolerated treatment well (pt stating she is fatigued.) Patient left: in chair;with call bell/phone within reach;with chair alarm set (pt noting that she feels fatigued after mobilizing 1 lap around the unit with PT with RW.)  OT Visit Diagnosis: Unsteadiness on feet (R26.81)                Time: 2229-7989 OT Time Calculation (min): 23 min Charges:  OT General Charges $OT Visit: 1 Visit OT Evaluation $OT Eval Moderate Complexity: 1 Mod  Kaila Devries Junie Panning, MS, OTR/L   Alvester Morin 04/24/2022, 2:05 PM

## 2022-04-25 DIAGNOSIS — F01A0 Vascular dementia, mild, without behavioral disturbance, psychotic disturbance, mood disturbance, and anxiety: Secondary | ICD-10-CM

## 2022-04-25 DIAGNOSIS — I10 Essential (primary) hypertension: Secondary | ICD-10-CM | POA: Diagnosis not present

## 2022-04-25 DIAGNOSIS — F03918 Unspecified dementia, unspecified severity, with other behavioral disturbance: Secondary | ICD-10-CM | POA: Diagnosis not present

## 2022-04-25 LAB — CULTURE, BLOOD (ROUTINE X 2)

## 2022-04-25 LAB — MRSA CULTURE

## 2022-04-25 MED ORDER — AMLODIPINE BESYLATE 5 MG PO TABS
5.0000 mg | ORAL_TABLET | Freq: Every day | ORAL | Status: DC
  Administered 2022-04-25 – 2022-07-19 (×86): 5 mg via ORAL
  Filled 2022-04-25 (×87): qty 1

## 2022-04-25 NOTE — Plan of Care (Signed)

## 2022-04-25 NOTE — TOC Progression Note (Signed)
Transition of Care [facility]) - Progression Note    Patient Details  Name: Madeline Taylor MRN: [id number] Date of Birth: 08/31/1935  Transition of Care [facility] CM/SW Contact  [name], RN Phone Number: 04/25/2022, 9:46 AM  Clinical Narrative: 4/7: Spoke with daughter regarding DME needs and evolving discharge plan. She is looking into memory care placement potential/Medicaid potential and other options. Patient's spouse and previous caretaker is currently in rehab for a fx ankle and is not around or capable of continuing caretaking/presence in the home. Patient has been paranoid, agitated, wandering away from the home towards the [facility] and APS is involved.  Patient does not qualify for physical rehab in a STR/SNF per current PT evaluation for mobility issues. Daughter does not feel spouse will be able to physically or cognitively care for patient when returns to the home environment and that patient is not safe alone for any period of time. Daughter works full time. Patient reportedly is difficult to redirect when becomes agitated/paranoid/ or wandering even if someone is attempting to redirect patient's behavior. Patient has no DME at home and daughter agreeable to [employer] for a recommended RW. [employer] contacted via [name] and will be delivered Monday am. OT referral/evaluation pending today as discharge plan evolves and APS has input on Monday along with daughter. [name] RN CM  [phone]  [name],[name] (Daughter)  [phone] (Mobile)        Expected Discharge Plan and Services                         DME Arranged: [name] rolling DME Agency: [employer] Date DME Agency Contacted: 04/25/22 Time DME Agency Contacted: [phone] Representative spoke with at DME Agency: [name]             Social Determinants of Health (SDOH) Interventions SDOH Screenings   Food Insecurity: Patient Unable To Answer (04/23/2022)  Housing: Low Risk  (04/23/2022)  Transportation Needs: No  Transportation Needs (04/23/2022)  Utilities: Not At Risk (04/23/2022)  Alcohol Screen: Low Risk  (05/21/2021)  Depression (PHQ2-9): Low Risk  (05/21/2021)  Financial Resource Strain: Low Risk  (05/21/2021)  Physical Activity: Inactive (05/21/2021)  Social Connections: Moderately Integrated (05/21/2021)  Stress: No Stress Concern Present (05/21/2021)  Tobacco Use: Low Risk  (04/23/2022)    Readmission Risk Interventions     No data to display

## 2022-04-25 NOTE — Progress Notes (Signed)
Physical Therapy Treatment Patient Details Name: Madeline Taylor MRN: [id number] DOB: 08-21-1935 Today's Date: 04/25/2022   History of Present Illness Pt is an 87 y/o F admitted on 04/22/22 after presenting with encephalopathy & hypothermia. Pt had COVID about a week ago with worsening behavior since then, as well as sleep deprivation. PMH: dementia, HTN, TIAs    PT Comments    Pt seen for PT tx with pt agreeable to tx. Pt pleasant throughout, following simple commands with extra time PRN. Pt demonstrates improved balance & activity tolerance as she is able to ambulate increased distances without AD with supervision on this date. Pt engaged in speed changes during gait & retrograde gait with supervision. Pt does require min assist for stair negotiation but completes floor transfer with supervision. Will continue to follow pt acutely to address high level balance, safety awareness, & stair negotiation to reduce pt's fall risk.    Recommendations for follow up therapy are one component of a multi-disciplinary discharge planning process, led by the attending physician.  Recommendations may be updated based on patient status, additional functional criteria and insurance authorization.  Follow Up Recommendations  Can patient physically be transported by private vehicle: Yes    Assistance Recommended at Discharge Frequent or constant Supervision/Assistance  Patient can return home with the following A little help with walking and/or transfers;A little help with bathing/dressing/bathroom;Assistance with cooking/housework;Assist for transportation;Help with stairs or ramp for entrance;Direct supervision/assist for medications management;Direct supervision/assist for financial management   Equipment Recommendations  Rolling walker (2 wheels)    Recommendations for Other Services       Precautions / Restrictions Precautions Precautions: Fall Restrictions Weight Bearing Restrictions: No      Mobility  Bed Mobility               General bed mobility comments: not tested, pt received & left sitting in recliner    Transfers Overall transfer level: Modified independent Equipment used: None Transfers: Sit to/from Stand Sit to Stand: Modified independent (Device/Increase time)           General transfer comment: STS from recliner with mod I    Ambulation/Gait Ambulation/Gait assistance: Supervision Gait Distance (Feet):  (>200 ft) Assistive device: None Gait Pattern/deviations: Decreased dorsiflexion - right, Decreased dorsiflexion - left, Decreased step length - right, Decreased stride length, Decreased step length - left Gait velocity: slightly decreased     General Gait Details: decreased weight shift to R. Pt engaged in speed changes (walk fast, stop) without LOB with supervision, retrograde gait without AD with supervision.   Stairs Stairs: Yes Stairs assistance: Min assist   Number of Stairs: 4 General stair comments: PT cued pt not to hold to rails but pt eventually did, pt requires min assist to negotiate 4 steps with pt electing step-over-step pattern.   Wheelchair Mobility    Modified Rankin (Stroke Patients Only)       Balance Overall balance assessment: Needs assistance   Sitting balance-Leahy Scale: Good     Standing balance support: During functional activity, No upper extremity supported Standing balance-Leahy Scale: Good                              Cognition Arousal/Alertness: Awake/alert Behavior During Therapy: WFL for tasks assessed/performed Overall Cognitive Status: History of cognitive impairments - at baseline  General Comments: Follows commands throughout session, pleasant.        Exercises      General Comments General comments (skin integrity, edema, etc.): Pt performed floor transfer with supervision.      Pertinent Vitals/Pain Pain  Assessment Pain Assessment: No/denies pain    Home Living                          Prior Function            PT Goals (current goals can now be found in the care plan section) Acute Rehab PT Goals Patient Stated Goal: none stated PT Goal Formulation: With patient Time For Goal Achievement: 05/08/22 Potential to Achieve Goals: Fair Progress towards PT goals: Progressing toward goals    Frequency    Min 2X/week      PT Plan Discharge plan needs to be updated    Co-evaluation              AM-PAC PT "6 Clicks" Mobility   Outcome Measure    Help needed moving from lying on your back to sitting on the side of a flat bed without using bedrails?: None Help needed moving to and from a bed to a chair (including a wheelchair)?: A Little Help needed standing up from a chair using your arms (e.g., wheelchair or bedside chair)?: None Help needed to walk in hospital room?: A Little Help needed climbing 3-5 steps with a railing? : A Little 6 Click Score: 17    End of Session   Activity Tolerance: Patient tolerated treatment well Patient left: in chair;with chair alarm set Nurse Communication: Mobility status PT Visit Diagnosis: Muscle weakness (generalized) (M62.81);Unsteadiness on feet (R26.81)     Time: 2035-5974 PT Time Calculation (min) (ACUTE ONLY): 13 min  Charges:  $Therapeutic Activity: 8-22 mins                     Aleda Grana, PT, DPT 04/25/22, 8:49 AM   Sandi Mariscal 04/25/2022, 8:47 AM

## 2022-04-25 NOTE — Progress Notes (Signed)
  Progress Note   Patient: Madeline Taylor UKG:[id number] DOB: 1935-11-23 DOA: 04/22/2022     2 DOS: the patient was seen and examined on 04/25/2022   Brief hospital course:  JALEEN SCHLADER is a 87 y.o. female with medical history significant of dementia, hypertension, history of TIAs presenting with encephalopathy, hypothermia  Per daughter, patient had worsening dementia, frequently Lost walking out of the house.  But normally has good appetite.  For the last week, patient had significant increased agitation, has not been sleeping well. She also had a positive COVID about a week ago, repeat COVID was negative at admission.    Principal Problem:   Encephalopathy Active Problems:   Hypothermia   Essential hypertension   Osteoarthritis of multiple joints   Vascular dementia   History of TIA (transient ischemic attack)   Metabolic acidosis   Dementia with behavioral disturbance   Assessment and Plan:  Dementia with behavioral disturbance. Vascular dementia. History of TIA. Patient has significant dementia, which is progressively getting worse.  She had COVID about a week ago, worsening behavior since then.  Patient also has sleep deprivation. Patient appears to have good appetite at home, no need for fluids. I started lower dose Seroquel at 25 mg every evening, start as needed Haldol for agitation.  Avoid benzodiazepine. Patient had a CT head, initially suspect lacunar infarct, MRI of the brain did not show any stroke.  Altered mental status unlikely due to stroke. Patient is evaluated by PT, no need for nursing home placement.  However, there is no safe discharge option, patient has been referred to adult protection agency.  Hypothermia Patient present with a low temperature at time of admission, which has quickly resolved.  TSH and cortisol level are within normal limits.  Patient does not have any evidence of infection.  I discontinued all antibiotics.   Essential hypertension Blood  pressure running high today, start amlodipine.  May restart losartan tomorrow if blood pressure still high.  Mild metabolic acidosis. Given 2 doses of oral sodium bicarbonate.     Subjective:  Patient slept last night, but woke up early this morning, was sitting in nursing station.  Patient was able to walk with physical therapy this morning.  Physical Exam: Vitals:   04/24/22 1606 04/24/22 1637 04/24/22 2347 04/25/22 0824  BP: (!) 162/67 (!) 156/71 120/77 (!) 155/73  Pulse: 72  82 73  Resp: 19  18 17   Temp: 98 F (36.7 C)  97.8 F (36.6 C) 98.4 F (36.9 C)  TempSrc:      SpO2: 99%  96% 98%  Weight:      Height:       General exam: Appears calm and comfortable  Respiratory system: Clear to auscultation. Respiratory effort normal. Cardiovascular system: S1 & S2 heard, RRR. No JVD, murmurs, rubs, gallops or clicks. No pedal edema. Gastrointestinal system: Abdomen is nondistended, soft and nontender. No organomegaly or masses felt. Normal bowel sounds heard. Central nervous system: Alert and oriented x2. No focal neurological deficits. Extremities: Symmetric 5 x 5 power. Skin: No rashes, lesions or ulcers Psychiatry: Judgement and insight appear normal. Mood & affect appropriate.    Data Reviewed:  Lab results reviewed.  Family Communication: Daughter updated bedside.  Disposition: Status is: Inpatient Remains inpatient appropriate because: Unsafe discharge.     Time spent: 35 minutes  Author: Marrion Coy, MD 04/25/2022 10:29 AM  For on call review [email or web address].

## 2022-04-26 DIAGNOSIS — F03918 Unspecified dementia, unspecified severity, with other behavioral disturbance: Secondary | ICD-10-CM | POA: Diagnosis not present

## 2022-04-26 DIAGNOSIS — F01A0 Vascular dementia, mild, without behavioral disturbance, psychotic disturbance, mood disturbance, and anxiety: Secondary | ICD-10-CM | POA: Diagnosis not present

## 2022-04-26 DIAGNOSIS — I10 Essential (primary) hypertension: Secondary | ICD-10-CM | POA: Diagnosis not present

## 2022-04-26 LAB — CULTURE, BLOOD (ROUTINE X 2)
Culture: NO GROWTH
Special Requests: ADEQUATE

## 2022-04-26 NOTE — Care Management Important Message (Signed)
Important Message  Patient Details  Name: Madeline Taylor MRN: [id number] Date of Birth: 05-12-1935   Medicare Important Message Given:  N/A - LOS <3 / Initial given by admissions     [name] 04/26/2022, 10:56 AM

## 2022-04-26 NOTE — Progress Notes (Signed)
Occupational Therapy Treatment Patient Details Name: Madeline Taylor MRN: [id number] DOB: [birth year] Today's Date: 04/26/2022   History of present illness Pt is an 87 y/o F admitted on 04/22/22 after presenting with encephalopathy & hypothermia. Pt had COVID about a week ago with worsening behavior since then, as well as sleep deprivation. PMH: dementia, HTN, TIAs   OT comments  Patient received in bathroom with sitter present. Oriented to self and hospital. Followed commands well t/o session. She was able to complete toilet transfer and toileting tasks with supervision this date. Pt then engaged in sinkside grooming tasks with set up-supervision. She returned to bed with Mod I and all needs in reach. Pt is making progress toward goal completion. OT will continue to follow acutely.    Recommendations for follow up therapy are one component of a multi-disciplinary discharge planning process, led by the attending physician.  Recommendations may be updated based on patient status, additional functional criteria and insurance authorization.    Assistance Recommended at Discharge Frequent or constant Supervision/Assistance  Patient can return home with the following  A little help with walking and/or transfers;A little help with bathing/dressing/bathroom;Assistance with cooking/housework;Direct supervision/assist for medications management;Direct supervision/assist for financial management;Assist for transportation;Help with stairs or ramp for entrance   Equipment Recommendations  None recommended by OT    Recommendations for Other Services      Precautions / Restrictions Precautions Precautions: Fall Restrictions Weight Bearing Restrictions: No       Mobility Bed Mobility Overal bed mobility: Modified Independent      Transfers Overall transfer level: Needs assistance Equipment used: None Transfers: Sit to/from Stand Sit to Stand: Supervision         Balance Overall balance  assessment: Needs assistance   Sitting balance-Leahy Scale: Normal     Standing balance support: During functional activity, No upper extremity supported Standing balance-Leahy Scale: Good           ADL either performed or assessed with clinical judgement   ADL Overall ADL's : Needs assistance/impaired     Grooming: Set up;Supervision/safety;Standing;Wash/dry face;Oral care         Toilet Transfer: Supervision/safety;Regular Toilet;Grab bars;Ambulation   Toileting- Clothing Manipulation and Hygiene: Supervision/safety;Sit to/from stand Toileting - Clothing Manipulation Details (indicate cue type and reason): pt with continent void in toilet     Functional mobility during ADLs: Supervision/safety (~47SJ from bathroom, no AD)      Extremity/Trunk Assessment Upper Extremity Assessment Upper Extremity Assessment: Overall WFL for tasks assessed   Lower Extremity Assessment Lower Extremity Assessment: Generalized weakness        Vision Patient Visual Report: No change from baseline     Perception     Praxis      Cognition Arousal/Alertness: Awake/alert Behavior During Therapy: WFL for tasks assessed/performed Overall Cognitive Status: History of cognitive impairments - at baseline         General Comments: Pleasant and cooperative. Followed commands well.        Exercises      Shoulder Instructions       General Comments      Pertinent Vitals/ Pain       Pain Assessment Pain Assessment: No/denies pain  Home Living Family/patient expects to be discharged to:: Private residence Living Arrangements: Alone          Prior Functioning/Environment              Frequency  Min 1X/week        Progress Toward Goals  OT Goals(current goals can now be found in the care plan section)  Progress towards OT goals: Progressing toward goals  Acute Rehab OT Goals Patient Stated Goal: get better OT Goal Formulation: Patient unable to  participate in goal setting Time For Goal Achievement: 05/08/22 Potential to Achieve Goals: Good  Plan Discharge plan needs to be updated;Frequency needs to be updated    Co-evaluation                 AM-PAC OT "6 Clicks" Daily Activity     Outcome Measure   Help from another person eating meals?: None Help from another person taking care of personal grooming?: A Little Help from another person toileting, which includes using toliet, bedpan, or urinal?: A Little Help from another person bathing (including washing, rinsing, drying)?: A Little Help from another person to put on and taking off regular upper body clothing?: None Help from another person to put on and taking off regular lower body clothing?: A Little 6 Click Score: 20    End of Session    OT Visit Diagnosis: Unsteadiness on feet (R26.81)   Activity Tolerance Patient tolerated treatment well   Patient Left in bed;with call bell/phone within reach;with bed alarm set;with nursing/sitter in room   Nurse Communication Mobility status        Time: 6269-4854 OT Time Calculation (min): 11 min  Charges: OT General Charges $OT Visit: 1 Visit OT Treatments $Self Care/Home Management : 8-22 mins  [facility] MS, OTR/L ascom [phone]  04/26/22, 5:34 PM

## 2022-04-26 NOTE — TOC Progression Note (Signed)
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 and will need a locked ALF unit for memory care  I notified the supervisor that she will likely be on the DTP list The patient's husband was her caregiver and he is now in a facility, she has no family able to stay with her        Expected Discharge Plan and Services                         DME Arranged: Walker rolling DME Agency: [employer] Date DME Agency Contacted: 04/25/22 Time DME Agency Contacted: [phone] Representative spoke with at DME Agency: [name]             Social Determinants of Health (SDOH) Interventions SDOH Screenings   Food Insecurity: Patient Unable To Answer (04/23/2022)  Housing: Low Risk  (04/23/2022)  Transportation Needs: No Transportation Needs (04/23/2022)  Utilities: Not At Risk (04/23/2022)  Alcohol Screen: Low Risk  (05/21/2021)  Depression (PHQ2-9): Low Risk  (05/21/2021)  Financial Resource Strain: Low Risk  (05/21/2021)  Physical Activity: Inactive (05/21/2021)  Social Connections: Moderately Integrated (05/21/2021)  Stress: No Stress Concern Present (05/21/2021)  Tobacco Use: Low Risk  (04/23/2022)    Readmission Risk Interventions     No data to display

## 2022-04-26 NOTE — Progress Notes (Addendum)
  Progress Note   Patient: Madeline Taylor DOB: 01-12-1936 DOA: 04/22/2022     3 DOS: the patient was seen and examined on 04/26/2022   Brief hospital course:  Madeline Taylor is a 87 y.o. female with medical history significant of dementia, hypertension, history of TIAs presenting with encephalopathy, hypothermia  Per daughter, patient had worsening dementia, frequently Lost walking out of the house.  But normally has good appetite.  For the last week, patient had significant increased agitation, has not been sleeping well. She also had a positive COVID about a week ago, repeat COVID was negative at admission.    Principal Problem:   Encephalopathy Active Problems:   Hypothermia   Essential hypertension   Osteoarthritis of multiple joints   Vascular dementia   History of TIA (transient ischemic attack)   Metabolic acidosis   Dementia with behavioral disturbance   Assessment and Plan: Dementia with behavioral disturbance. Acute metabolic encephalopathy due to sleep disturbance Vascular dementia. History of TIA. Patient has significant dementia, which is progressively getting worse.  She had COVID about a week ago, worsening behavior since then.  Patient also has sleep deprivation. Patient appears to have good appetite at home, no need for fluids. I started lower dose Seroquel at 25 mg every evening, start as needed Haldol for agitation.  Avoid benzodiazepine. Patient had a CT head, initially suspect lacunar infarct, MRI of the brain did not show any stroke.  Altered mental status unlikely due to stroke. Patient is evaluated by PT, no need for nursing home placement.  However, there is no safe discharge option, patient has been referred to adult protection agency.  Hypothermia Patient present with a low temperature at time of admission, which has quickly resolved.  TSH and cortisol level are within normal limits.  Patient does not have any evidence of infection.  I  discontinued all antibiotics.   Essential hypertension Blood pressure is better after restarting amlodipine.  Hold off losartan for now.   Mild metabolic acidosis. Given 2 doses of oral sodium bicarbonate.        Subjective:  Patient has no complaint today.  Physical Exam: Vitals:   04/25/22 0824 04/25/22 1456 04/25/22 2347 04/26/22 0828  BP: (!) 155/73 (!) 144/70 119/65 134/86  Pulse: 73 82 82 69  Resp: 17 17 18 16   Temp: 98.4 F (36.9 C) 98.1 F (36.7 C) 98.2 F (36.8 C) 98 F (36.7 C)  TempSrc:    Oral  SpO2: 98% 100% 98% 98%  Weight:      Height:       General exam: Appears calm and comfortable  Respiratory system: Clear to auscultation. Respiratory effort normal. Cardiovascular system: S1 & S2 heard, RRR. No JVD, murmurs, rubs, gallops or clicks. No pedal edema. Gastrointestinal system: Abdomen is nondistended, soft and nontender. No organomegaly or masses felt. Normal bowel sounds heard. Central nervous system: Alert and oriented x2.  No focal neurological deficits. Extremities: Symmetric 5 x 5 power. Skin: No rashes, lesions or ulcers Psychiatry: Judgement and insight appear normal. Mood & affect appropriate. '  Data Reviewed:  There are no new results to review at this time.  Family Communication: None  Disposition: Status is: Inpatient Remains inpatient appropriate because: Unsafe discharge.     Time spent: 25 minutes  Author: Marrion Coy, MD 04/26/2022 1:13 PM  For on call review [email or web address].

## 2022-04-27 DIAGNOSIS — G9340 Encephalopathy, unspecified: Secondary | ICD-10-CM | POA: Diagnosis not present

## 2022-04-27 DIAGNOSIS — F03918 Unspecified dementia, unspecified severity, with other behavioral disturbance: Secondary | ICD-10-CM | POA: Diagnosis not present

## 2022-04-27 MED ORDER — LOSARTAN POTASSIUM 50 MG PO TABS: 100.0000 mg | ORAL_TABLET | Freq: Every day | ORAL | Status: DC

## 2022-04-27 MED ORDER — LOSARTAN POTASSIUM 50 MG PO TABS
50.0000 mg | ORAL_TABLET | Freq: Every day | ORAL | Status: DC
  Administered 2022-04-27 – 2022-07-19 (×83): 50 mg via ORAL
  Filled 2022-04-27 (×83): qty 1

## 2022-04-27 NOTE — Progress Notes (Signed)
Physical Therapy Treatment Patient Details Name: Madeline Taylor MRN: [id number] DOB: 08/15/1935 Today's Date: 04/27/2022   History of Present Illness Pt is an 87 y/o F admitted on 04/22/22 after presenting with encephalopathy & hypothermia. Pt had COVID about a week ago with worsening behavior since then, as well as sleep deprivation. PMH: dementia, HTN, TIAs    PT Comments    OOB to walk on unit and for stair training.  One LOB when changing direction quickly but recovers on her own.  Sitter remains in room.  Recommendations for follow up therapy are one component of a multi-disciplinary discharge planning process, led by the attending physician.  Recommendations may be updated based on patient status, additional functional criteria and insurance authorization.  Follow Up Recommendations       Assistance Recommended at Discharge Frequent or constant Supervision/Assistance  Patient can return home with the following A little help with walking and/or transfers;A little help with bathing/dressing/bathroom;Assistance with cooking/housework;Assist for transportation;Help with stairs or ramp for entrance;Direct supervision/assist for medications management;Direct supervision/assist for financial management   Equipment Recommendations       Recommendations for Other Services       Precautions / Restrictions Precautions Precautions: Fall Restrictions Weight Bearing Restrictions: No     Mobility  Bed Mobility Overal bed mobility: Modified Independent                  Transfers Overall transfer level: Modified independent                      Ambulation/Gait Ambulation/Gait assistance: Supervision Gait Distance (Feet): 400 Feet Assistive device: None   Gait velocity: slightly decreased         Stairs Stairs: Yes Stairs assistance: Min guard Stair Management: Two rails, Alternating pattern, Forwards Number of Stairs: 4     Wheelchair Mobility     Modified Rankin (Stroke Patients Only)       Balance Overall balance assessment: Needs assistance Sitting-balance support: Feet supported Sitting balance-Leahy Scale: Normal     Standing balance support: No upper extremity supported Standing balance-Leahy Scale: Good                              Cognition Arousal/Alertness: Awake/alert Behavior During Therapy: WFL for tasks assessed/performed Overall Cognitive Status: History of cognitive impairments - at baseline                                          Exercises      General Comments        Pertinent Vitals/Pain Pain Assessment Pain Assessment: Faces Faces Pain Scale: Hurts a little bit Pain Location: hips - with gait Pain Descriptors / Indicators: Discomfort    Home Living                          Prior Function            PT Goals (current goals can now be found in the care plan section) Progress towards PT goals: Progressing toward goals    Frequency    Min 2X/week      PT Plan Current plan remains appropriate    Co-evaluation              AM-PAC PT "6 Clicks" Mobility  Outcome Measure  Help needed turning from your back to your side while in a flat bed without using bedrails?: None Help needed moving from lying on your back to sitting on the side of a flat bed without using bedrails?: None Help needed moving to and from a bed to a chair (including a wheelchair)?: None Help needed standing up from a chair using your arms (e.g., wheelchair or bedside chair)?: None Help needed to walk in hospital room?: A Little Help needed climbing 3-5 steps with a railing? : A Little 6 Click Score: 22    End of Session Equipment Utilized During Treatment: Gait belt Activity Tolerance: Patient tolerated treatment well Patient left: in bed;with bed alarm set;with nursing/sitter in room;with call bell/phone within reach Nurse Communication: Mobility status        Time: 9024-0973 PT Time Calculation (min) (ACUTE ONLY): 9 min  Charges:  $Gait Training: 8-22 mins                   Danielle Dess, PTA 04/27/22, 12:56 PM

## 2022-04-27 NOTE — Progress Notes (Signed)
  Progress Note   Patient: Madeline Taylor PJK:[id number] DOB: [birth year] DOA: 04/22/2022     4 DOS: the patient was seen and examined on 04/27/2022   Brief hospital course:  NAHJAE NAEEM is a 87 y.o. female with medical history significant of dementia, hypertension, history of TIAs presenting with encephalopathy, hypothermia  Per daughter, patient had worsening dementia, frequently Lost walking out of the house.  But normally has good appetite.  For the last week, patient had significant increased agitation, has not been sleeping well. She also had a positive COVID about a week ago, repeat COVID was negative at admission. Patient is seen by PT/OT, no need for nursing home placement.  However unsafe discharge, APS involved.    Principal Problem:   Encephalopathy Active Problems:   Hypothermia   Essential hypertension   Osteoarthritis of multiple joints   Vascular dementia   History of TIA (transient ischemic attack)   Metabolic acidosis   Dementia with behavioral disturbance   Assessment and Plan:  Dementia with behavioral disturbance. Acute metabolic encephalopathy due to sleep disturbance Vascular dementia. History of TIA. Patient has significant dementia, which is progressively getting worse.  She had COVID about a week ago, worsening behavior since then.  Patient also has sleep deprivation. Patient appears to have good appetite at home, no need for fluids. I started lower dose Seroquel at 25 mg every evening, start as needed Haldol for agitation.  Avoid benzodiazepine. Patient had a CT head, initially suspect lacunar infarct, MRI of the brain did not show any stroke.  Altered mental status unlikely due to stroke. Patient is evaluated by PT, no need for nursing home placement.  However, there is no safe discharge option, patient has been referred to adult protection agency. Patient is stable today.  No new issues.   Hypothermia Patient present with a low temperature at time of  admission, which has quickly resolved.  TSH and cortisol level are within normal limits.  Patient does not have any evidence of infection.  I discontinued all antibiotics.   Essential hypertension Amlodipine, restart losartan.   Mild metabolic acidosis. Given 2 doses of oral sodium bicarbonate.       Subjective:  No new issues.  Physical Exam: Vitals:   04/26/22 0828 04/26/22 1617 04/26/22 2310 04/27/22 0737  BP: 134/86 (!) 145/67 (!) 166/81 (!) 153/81  Pulse: 69 87 (!) 103 85  Resp: 16 18 20 18   Temp: 98 F (36.7 C)  98 F (36.7 C) 98.2 F (36.8 C)  TempSrc: Oral     SpO2: 98% 98% 97% 99%  Weight:      Height:       General exam: Appears calm and comfortable  Respiratory system: Clear to auscultation. Respiratory effort normal. Cardiovascular system: S1 & S2 heard, RRR. No JVD, murmurs, rubs, gallops or clicks. No pedal edema. Gastrointestinal system: Abdomen is nondistended, soft and nontender. No organomegaly or masses felt. Normal bowel sounds heard. Central nervous system: Alert and oriented x2. No focal neurological deficits. Extremities: Symmetric 5 x 5 power. Skin: No rashes, lesions or ulcers Psychiatry: Judgement and insight appear normal. Mood & affect appropriate.    Data Reviewed:  No new labs.  Family Communication: None  Disposition: Status is: Inpatient Remains inpatient appropriate because: Unsafe discharge.     Time spent: 25 minutes  Author: Marrion Coy, MD 04/27/2022 3:01 PM  For on call review [email or web address].

## 2022-04-27 NOTE — Plan of Care (Signed)

## 2022-04-28 DIAGNOSIS — E872 Acidosis, unspecified: Secondary | ICD-10-CM | POA: Diagnosis not present

## 2022-04-28 DIAGNOSIS — F01B18 Vascular dementia, moderate, with other behavioral disturbance: Secondary | ICD-10-CM | POA: Diagnosis not present

## 2022-04-28 DIAGNOSIS — T68XXXS Hypothermia, sequela: Secondary | ICD-10-CM | POA: Diagnosis not present

## 2022-04-28 DIAGNOSIS — I10 Essential (primary) hypertension: Secondary | ICD-10-CM | POA: Diagnosis not present

## 2022-04-28 DIAGNOSIS — Z8673 Personal history of transient ischemic attack (TIA), and cerebral infarction without residual deficits: Secondary | ICD-10-CM | POA: Diagnosis not present

## 2022-04-28 LAB — CULTURE, BLOOD (ROUTINE X 2)

## 2022-04-28 LAB — CBC
HCT: 39.4 % (ref 36.0–46.0)
Hemoglobin: 12.7 g/dL (ref 12.0–15.0)
MCH: 29.1 pg (ref 26.0–34.0)
MCHC: 32.2 g/dL (ref 30.0–36.0)
MCV: 90.2 fL (ref 80.0–100.0)
Platelets: 266 10*3/uL (ref 150–400)
RBC: 4.37 MIL/uL (ref 3.87–5.11)
RDW: 12.7 % (ref 11.5–15.5)
WBC: 6 10*3/uL (ref 4.0–10.5)
nRBC: 0 % (ref 0.0–0.2)

## 2022-04-28 MED ORDER — HALOPERIDOL LACTATE 5 MG/ML IJ SOLN
2.0000 mg | Freq: Once | INTRAMUSCULAR | Status: AC
  Administered 2022-04-28: 2 mg via INTRAMUSCULAR
  Filled 2022-04-28: qty 1

## 2022-04-28 NOTE — Plan of Care (Signed)

## 2022-04-28 NOTE — Progress Notes (Signed)
       CROSS COVER NOTE  NAME: Madeline Taylor MRN: [id number] DOB : 05-27-1935    HPI/Events of Note   Report:Nurse reports patient combative with staff, biting and kicking. IV haldol ordered as needed for agitation but no IV access  On review of chart:History of vascular dementia with behavioral disturbance.     Assessment and  Interventions   Assessment:  Plan: 2 mg IM haldol ordered       Donnie Mesa NP [facility]

## 2022-04-28 NOTE — Assessment & Plan Note (Signed)
MRI brain was negative for acute intracranial abnormality.

## 2022-04-28 NOTE — Assessment & Plan Note (Signed)
Improved

## 2022-04-28 NOTE — Progress Notes (Signed)
Occupational Therapy Treatment Patient Details Name: Madeline Taylor MRN: [id number] DOB: 1935-02-03 Today's Date: 04/28/2022   History of present illness Pt is an 87 y/o F admitted on 04/22/22 after presenting with encephalopathy & hypothermia. Pt had COVID about a week ago with worsening behavior since then, as well as sleep deprivation. PMH: dementia, HTN, TIAs   OT comments  Ms Edgington was seen for OT treatment on this date. Upon arrival to room pt long sitting in bed, agreeable to tx. Pt MOD I for bed mobility.  MOD I don/doff jacket in sitting. Attempted to complete card sorting activity however following ~5 cards pt unable to recall sorting criteria. MIN cues to correctly answer x3 safety scenarios (house fire, leaking pipes, a fall). Completed orientation log with 12/30 score, pt disoriented to time/situation. Pt making good progress toward goals, will continue to follow POC. Discharge recommendation remains appropriate.     Recommendations for follow up therapy are one component of a multi-disciplinary discharge planning process, led by the attending physician.  Recommendations may be updated based on patient status, additional functional criteria and insurance authorization.    Assistance Recommended at Discharge Frequent or constant Supervision/Assistance  Patient can return home with the following  A little help with walking and/or transfers;A little help with bathing/dressing/bathroom;Assistance with cooking/housework;Direct supervision/assist for medications management;Direct supervision/assist for financial management;Assist for transportation;Help with stairs or ramp for entrance   Equipment Recommendations  None recommended by OT    Recommendations for Other Services      Precautions / Restrictions Precautions Precautions: Fall Restrictions Weight Bearing Restrictions: No       Mobility Bed Mobility Overal bed mobility: Modified Independent                   Transfers      Pt deferred citing fatigue                        ADL either performed or assessed with clinical judgement   ADL Overall ADL's : Needs assistance/impaired                                       General ADL Comments: MOD I don/doff jacket in sitting. Attempted to complete card sorting activity however following ~5 cards pt unable to recall sorting criteria. MIN cues to correctyl anser x3 safety scenarios (fire, leaking pipes, a fall).      Cognition Arousal/Alertness: Awake/alert Behavior During Therapy: WFL for tasks assessed/performed Overall Cognitive Status: History of cognitive impairments - at baseline                                 General Comments: orientation log 12/30                   Pertinent Vitals/ Pain       Pain Assessment Pain Assessment: Faces Pain Score: 2  Pain Location: hips Pain Descriptors / Indicators: Discomfort Pain Intervention(s): Limited activity within patient's tolerance   Frequency  Min 1X/week        Progress Toward Goals  OT Goals(current goals can now be found in the care plan section)  Progress towards OT goals: Progressing toward goals  Acute Rehab OT Goals Patient Stated Goal: get better OT Goal Formulation: With patient Time For Goal Achievement: 05/08/22 Potential  to Achieve Goals: Good ADL Goals Pt Will Perform Lower Body Dressing: with modified independence;sit to/from stand Pt Will Transfer to Toilet: with modified independence;ambulating Pt Will Perform Toileting - Clothing Manipulation and hygiene: with modified independence;sit to/from stand  Plan Discharge plan remains appropriate;Frequency remains appropriate       AM-PAC OT "6 Clicks" Daily Activity     Outcome Measure   Help from another person eating meals?: None Help from another person taking care of personal grooming?: A Little Help from another person toileting, which includes using  toliet, bedpan, or urinal?: A Little Help from another person bathing (including washing, rinsing, drying)?: A Little Help from another person to put on and taking off regular upper body clothing?: None Help from another person to put on and taking off regular lower body clothing?: A Little 6 Click Score: 20    End of Session    OT Visit Diagnosis: Unsteadiness on feet (R26.81)   Activity Tolerance Patient tolerated treatment well   Patient Left in bed;with call bell/phone within reach;with bed alarm set   Nurse Communication          Time: 1657-9038 OT Time Calculation (min): 11 min  Charges: OT General Charges $OT Visit: 1 Visit OT Treatments $Therapeutic Activity: 8-22 mins  Kathie Dike, M.S. OTR/L  04/28/22, 3:44 PM  ascom [phone]

## 2022-04-28 NOTE — Progress Notes (Signed)
  Progress Note   Patient: Madeline Taylor DOB: 08-23-1935 DOA: 04/22/2022     5 DOS: the patient was seen and examined on 04/28/2022   Brief hospital course:  Madeline Taylor is a 87 y.o. female with medical history significant of dementia, hypertension, history of TIAs presenting with encephalopathy, hypothermia  Per daughter, patient had worsening dementia, frequently Lost walking out of the house.  But normally has good appetite.  For the last week, patient had significant increased agitation, has not been sleeping well. She also had a positive COVID about a week ago, repeat COVID was negative at admission. Patient is seen by PT/OT, no need for nursing home placement.  However unsafe discharge.    Assessment and Plan: * Vascular dementia Dementia with behavioral disturbance.  On Seroquel at night.  Patient answering questions appropriately.  Hypothermia Resolved.  No signs of infection  Metabolic acidosis Improved  History of TIA (transient ischemic attack) MRI brain was negative for acute intracranial abnormality.  Essential hypertension On amlodipine and losartan         Subjective: Patient states she feels fine.  She feels the best that she is felt in a while.  She wants to get out of the hospital soon as possible.  Was admitted for altered mental status.  Physical Exam: Vitals:   04/26/22 2310 04/27/22 0737 04/27/22 1518 04/28/22 0555  BP: (!) 166/81 (!) 153/81 138/68 137/70  Pulse: (!) 103 85 81 79  Resp: 20 18 18 16   Temp: 98 F (36.7 C) 98.2 F (36.8 C)  98 F (36.7 C)  TempSrc:      SpO2: 97% 99% 97% 99%  Weight:      Height:       Physical Exam HENT:     Head: Normocephalic.     Mouth/Throat:     Pharynx: No oropharyngeal exudate.  Eyes:     General: Lids are normal.     Conjunctiva/sclera: Conjunctivae normal.  Cardiovascular:     Rate and Rhythm: Normal rate and regular rhythm.     Heart sounds: Normal heart sounds, S1 normal and S2  normal.  Pulmonary:     Breath sounds: No decreased breath sounds, wheezing, rhonchi or rales.  Abdominal:     Palpations: Abdomen is soft.     Tenderness: There is no abdominal tenderness.  Musculoskeletal:     Right lower leg: No swelling.     Left lower leg: No swelling.  Skin:    General: Skin is warm.     Findings: No rash.  Neurological:     Mental Status: She is alert.     Comments: Answers simple questions appropriately.     Data Reviewed: CBC within normal range  Family Communication: Spoke with patient's daughter on the phone  Disposition: Status is: Inpatient Remains inpatient appropriate because: We do not have a safe disposition.  Planned Discharge Destination: To be determined    Time spent: 28 minutes  Author: Alford Highland, MD 04/28/2022 4:24 PM  For on call review [email or web address].

## 2022-04-28 NOTE — Assessment & Plan Note (Signed)
On amlodipine and losartan

## 2022-04-28 NOTE — Assessment & Plan Note (Addendum)
Dementia with behavioral disturbance.  On Seroquel at night.  Patient calm and cooperative and answers questions.  Patient has not been wandering.

## 2022-04-28 NOTE — Progress Notes (Signed)
Physical Therapy Treatment Patient Details Name: Madeline Taylor MRN: [id number] DOB: Jul 26, 1935 Today's Date: 04/28/2022   History of Present Illness Pt is an 87 y/o F admitted on 04/22/22 after presenting with encephalopathy & hypothermia. Pt had COVID about a week ago with worsening behavior since then, as well as sleep deprivation. PMH: dementia, HTN, TIAs    PT Comments    Pt was I'ly getting up OOB with alarm going off. She is A but disoriented. Agrees to session and cooperative throughout. Physically, she required no assistance to stand or ambulate in hallway. She safely demonstrated abilities to perform stairs with +1 UE support. Acute PT will continue to follow in acute hospital however pt does not need/want post acute therapy.   Recommendations for follow up therapy are one component of a multi-disciplinary discharge planning process, led by the attending physician.  Recommendations may be updated based on patient status, additional functional criteria and insurance authorization.     Assistance Recommended at Discharge Intermittent Supervision/Assistance  Patient can return home with the following A little help with walking and/or transfers;A little help with bathing/dressing/bathroom;Assistance with cooking/housework;Assist for transportation;Help with stairs or ramp for entrance;Direct supervision/assist for medications management;Direct supervision/assist for financial management   Equipment Recommendations  Rolling walker (2 wheels)       Precautions / Restrictions Precautions Precautions: Fall Restrictions Weight Bearing Restrictions: No     Mobility  Bed Mobility Overal bed mobility: Modified Independent     Transfers Overall transfer level: Modified independent Equipment used: None Transfers: Sit to/from Stand Sit to Stand: Supervision   Ambulation/Gait Ambulation/Gait assistance: Supervision Gait Distance (Feet): 600 Feet Assistive device: None Gait  Pattern/deviations: Step-through pattern Gait velocity: slightly decreased  General Gait Details: pt was able to ambulate 600 ft without AD or LOB   Stairs Stairs: Yes Stairs assistance: Supervision Stair Management: Two rails, Alternating pattern, Forwards Number of Stairs: 12     Balance Overall balance assessment: Needs assistance Sitting-balance support: Feet supported Sitting balance-Leahy Scale: Normal     Standing balance support: No upper extremity supported Standing balance-Leahy Scale: Good      Cognition Arousal/Alertness: Awake/alert Behavior During Therapy: WFL for tasks assessed/performed Overall Cognitive Status: History of cognitive impairments - at baseline      General Comments: Pleasant and cooperative. Followed commands well.               Pertinent Vitals/Pain Pain Assessment Pain Assessment: 0-10 Pain Score: 2  Pain Location: hips - with gait Pain Descriptors / Indicators: Discomfort Pain Intervention(s): Limited activity within patient's tolerance, Monitored during session, Premedicated before session, Repositioned     PT Goals (current goals can now be found in the care plan section) Acute Rehab PT Goals Patient Stated Goal: none stated Progress towards PT goals: Progressing toward goals       PT Plan Current plan remains appropriate    Co-evaluation     PT goals addressed during session: Mobility/safety with mobility;Balance;Proper use of DME        AM-PAC PT "6 Clicks" Mobility   Outcome Measure  Help needed turning from your back to your side while in a flat bed without using bedrails?: None Help needed moving from lying on your back to sitting on the side of a flat bed without using bedrails?: None Help needed moving to and from a bed to a chair (including a wheelchair)?: None Help needed standing up from a chair using your arms (e.g., wheelchair or bedside chair)?: None Help needed  to walk in hospital room?: A Little Help  needed climbing 3-5 steps with a railing? : A Little 6 Click Score: 22    End of Session Equipment Utilized During Treatment: Gait belt Activity Tolerance: Patient tolerated treatment well Patient left: in bed;with bed alarm set;with nursing/sitter in room;with call bell/phone within reach Nurse Communication: Mobility status PT Visit Diagnosis: Muscle weakness (generalized) (M62.81);Unsteadiness on feet (R26.81)     Time: 1859-0931 PT Time Calculation (min) (ACUTE ONLY): 10 min  Charges:  $Gait Training: 8-22 mins                     Jetta Lout PTA 04/28/22, 2:33 PM

## 2022-04-29 DIAGNOSIS — T68XXXS Hypothermia, sequela: Secondary | ICD-10-CM | POA: Diagnosis not present

## 2022-04-29 DIAGNOSIS — E872 Acidosis, unspecified: Secondary | ICD-10-CM | POA: Diagnosis not present

## 2022-04-29 DIAGNOSIS — Z8673 Personal history of transient ischemic attack (TIA), and cerebral infarction without residual deficits: Secondary | ICD-10-CM | POA: Diagnosis not present

## 2022-04-29 DIAGNOSIS — I10 Essential (primary) hypertension: Secondary | ICD-10-CM | POA: Diagnosis not present

## 2022-04-29 DIAGNOSIS — F01B18 Vascular dementia, moderate, with other behavioral disturbance: Secondary | ICD-10-CM | POA: Diagnosis not present

## 2022-04-29 NOTE — Progress Notes (Signed)
PT Cancellation Note  Patient Details Name: Madeline Taylor MRN: [id number] DOB: 10-26-1935   Cancelled Treatment:     PT attempt. Pt politely refused. " I walked already with the other guy." Pt referring to mobility specialist. Acute PT will continue to follow per current POC however might sign off after next session due to lack of skilled needs identified. Pt is difficult to place due to cognition/behaviors/dementia .    Rushie Chestnut 04/29/2022, 3:01 PM

## 2022-04-29 NOTE — Progress Notes (Signed)
Mobility Specialist - Progress Note   04/29/22 1344  Mobility  Activity Ambulated independently in hallway;Stood at bedside;Dangled on edge of bed  Level of Assistance Independent  Assistive Device None  Distance Ambulated (ft) 1200 ft  Activity Response Tolerated well  Mobility Referral Yes  $Mobility charge 1 Mobility   Pt supine in bed on RA upon arrival. Pt STS and ambulates in hallway indep. Pt returns to bed with needs in reach and sitter present.   Terrilyn Saver  Mobility Specialist  04/29/22 1:45 PM

## 2022-04-29 NOTE — Plan of Care (Signed)
  Problem: Education: Goal: Knowledge of General Education information will improve Description: Including pain rating scale, medication(s)/side effects and non-pharmacologic comfort measures Outcome: Progressing   Problem: Health Behavior/Discharge Planning: Goal: Ability to manage health-related needs will improve Outcome: Progressing   Problem: Safety: Goal: Ability to remain free from injury will improve Outcome: Progressing   

## 2022-04-29 NOTE — Progress Notes (Signed)
  Progress Note   Patient: SHARAINE LOOKABILL ZES:[id number] DOB: [birth year] DOA: 04/22/2022     6 DOS: the patient was seen and examined on 04/29/2022   Brief hospital course:  CORIANNE WERDER is a 87 y.o. female with medical history significant of dementia, hypertension, history of TIAs presenting with encephalopathy, hypothermia  Per daughter, patient had worsening dementia, frequently Lost walking out of the house.  But normally has good appetite.  For the last week, patient had significant increased agitation, has not been sleeping well. She also had a positive COVID about a week ago, repeat COVID was negative at admission. Patient is seen by PT/OT, no need for nursing home placement.  However unsafe discharge.    Assessment and Plan: * Vascular dementia Dementia with behavioral disturbance.  On Seroquel at night.  Patient answering questions appropriately.  Hypothermia Resolved.  No signs of infection  Metabolic acidosis Improved  History of TIA (transient ischemic attack) MRI brain was negative for acute intracranial abnormality.  Essential hypertension On amlodipine and losartan         Subjective: Patient feeling okay.  Offers no complaints.  Admitted with altered mental status.  Physical Exam: Vitals:   04/28/22 0555 04/28/22 1300 04/28/22 1700 04/29/22 0814  BP: 137/70 138/68 137/80 134/61  Pulse: 79 80 67 63  Resp: 16 18 16 17   Temp: 98 F (36.7 C) 98.3 F (36.8 C) 98.5 F (36.9 C) 98.4 F (36.9 C)  TempSrc:    Oral  SpO2: 99% 100% 100% 99%  Weight:      Height:       Physical Exam HENT:     Head: Normocephalic.     Mouth/Throat:     Pharynx: No oropharyngeal exudate.  Eyes:     General: Lids are normal.     Conjunctiva/sclera: Conjunctivae normal.  Cardiovascular:     Rate and Rhythm: Normal rate and regular rhythm.     Heart sounds: Normal heart sounds, S1 normal and S2 normal.  Pulmonary:     Breath sounds: No decreased breath sounds, wheezing,  rhonchi or rales.  Abdominal:     Palpations: Abdomen is soft.     Tenderness: There is no abdominal tenderness.  Musculoskeletal:     Right lower leg: No swelling.     Left lower leg: No swelling.  Skin:    General: Skin is warm.     Findings: No rash.  Neurological:     Mental Status: She is alert.     Comments: Answers simple questions appropriately.     Data Reviewed: No new data today  Family Communication: Spoke with daughter yesterday.  Disposition: Status is: Inpatient Remains inpatient appropriate because: We need a safe disposition  Planned Discharge Destination: Potentially memory care unit    Time spent: 27 minutes  Author: Alford Highland, MD 04/29/2022 3:16 PM  For on call review [email or web address].

## 2022-04-30 DIAGNOSIS — E872 Acidosis, unspecified: Secondary | ICD-10-CM | POA: Diagnosis not present

## 2022-04-30 DIAGNOSIS — F01B18 Vascular dementia, moderate, with other behavioral disturbance: Secondary | ICD-10-CM | POA: Diagnosis not present

## 2022-04-30 DIAGNOSIS — I10 Essential (primary) hypertension: Secondary | ICD-10-CM | POA: Diagnosis not present

## 2022-04-30 DIAGNOSIS — Z8673 Personal history of transient ischemic attack (TIA), and cerebral infarction without residual deficits: Secondary | ICD-10-CM | POA: Diagnosis not present

## 2022-04-30 DIAGNOSIS — T68XXXS Hypothermia, sequela: Secondary | ICD-10-CM | POA: Diagnosis not present

## 2022-04-30 LAB — VITAMIN B12: Vitamin B-12: 385 pg/mL (ref 180–914)

## 2022-04-30 LAB — BASIC METABOLIC PANEL
Anion gap: 9 (ref 5–15)
BUN: 26 mg/dL — ABNORMAL HIGH (ref 8–23)
CO2: 23 mmol/L (ref 22–32)
Calcium: 9.1 mg/dL (ref 8.9–10.3)
Chloride: 105 mmol/L (ref 98–111)
Creatinine, Ser: 0.81 mg/dL (ref 0.44–1.00)
GFR, Estimated: >60 mL/min (ref >60)
Glucose, Bld: 128 mg/dL — ABNORMAL HIGH (ref 70–99)
Potassium: 4 mmol/L (ref 3.5–5.1)
Sodium: 137 mmol/L (ref 135–145)

## 2022-04-30 NOTE — TOC Progression Note (Addendum)
Transition of Care [facility]) - Progression Note    Patient Details  Name: Madeline Taylor MRN: [id number] Date of Birth: Jul 26, 1935  [facility] CM/SW Contact  Marlowe Sax, RN Phone Number: 04/30/2022, 2:30 PM  Clinical Narrative:    Spoke with Nancy Fetter with [employer] APS, she requested an FL2 and a note from Doctor about capacity, then to be emailed to her at [email or web address], They will assist with Placement and getting Medicaid PASSR pending  Emailed in secure email the fl2 and letter about capacity to Nancy Fetter  Expected Discharge Plan: Long Term Nursing Home Barriers to Discharge: No SNF bed  Expected Discharge Plan and Services       Living arrangements for the past 2 months: Single Family Home                 DME Arranged: Walker rolling DME Agency: [employer] Date DME Agency Contacted: 04/25/22 Time DME Agency Contacted: [phone] Representative spoke with at DME Agency: [name]             Social Determinants of Health (SDOH) Interventions SDOH Screenings   Food Insecurity: Patient Unable To Answer (04/23/2022)  Housing: Low Risk  (04/23/2022)  Transportation Needs: No Transportation Needs (04/23/2022)  Utilities: Not At Risk (04/23/2022)  Alcohol Screen: Low Risk  (05/21/2021)  Depression (PHQ2-9): Low Risk  (05/21/2021)  Financial Resource Strain: Low Risk  (05/21/2021)  Physical Activity: Inactive (05/21/2021)  Social Connections: Moderately Integrated (05/21/2021)  Stress: No Stress Concern Present (05/21/2021)  Tobacco Use: Low Risk  (04/23/2022)    Readmission Risk Interventions     No data to display

## 2022-04-30 NOTE — Progress Notes (Signed)
Physical Therapy Treatment Patient Details Name: Madeline Taylor MRN: [id number] DOB: 12-09-1935 Today's Date: 04/30/2022   History of Present Illness Pt is an 87 y/o F admitted on 04/22/22 after presenting with encephalopathy & hypothermia. Pt had COVID about a week ago with worsening behavior since then, as well as sleep deprivation. PMH: dementia, HTN, TIAs    PT Comments    Pt was pleasant and motivated to participate during the session and put forth good effort throughout. Pt generally steady with gait with alternating pattern and no overt LOB with only min deficit in cadence.  Pt able to ascend and descend 4 steps x 2 without rails per home setup with min to mod instability most notably while ascending steps. Pt able to self correct without assistance but required close CGA with pt self-selecting to change from an alternating pattern to a step-to pattern during second trial with noted improvement in stability.  No skilled PT services recommended at discharge.      Recommendations for follow up therapy are one component of a multi-disciplinary discharge planning process, led by the attending physician.  Recommendations may be updated based on patient status, additional functional criteria and insurance authorization.  Follow Up Recommendations  Can patient physically be transported by private vehicle: Yes    Assistance Recommended at Discharge Frequent or constant Supervision/Assistance  Patient can return home with the following A little help with walking and/or transfers;A little help with bathing/dressing/bathroom;Assistance with cooking/housework;Assist for transportation;Help with stairs or ramp for entrance;Direct supervision/assist for medications management;Direct supervision/assist for financial management   Equipment Recommendations  None recommended by PT    Recommendations for Other Services       Precautions / Restrictions Precautions Precautions: Fall Restrictions Weight  Bearing Restrictions: No     Mobility  Bed Mobility Overal bed mobility: Modified Independent             General bed mobility comments: Min extra time and effort only    Transfers Overall transfer level: Modified independent                 General transfer comment: Good eccentric and concentric control and stability from multiple height surfaces    Ambulation/Gait Ambulation/Gait assistance: Supervision Gait Distance (Feet): 300 Feet x 2 Assistive device: None Gait Pattern/deviations: Step-through pattern, Decreased stride length Gait velocity: slightly decreased     General Gait Details: Min decreased cadence and LUE arm swing but grossly steady without overt LOB   Stairs Stairs: Yes Stairs assistance: Min guard Stair Management: Alternating pattern, Step to pattern, Forwards, No rails Number of Stairs: 8 General stair comments: Pt able to ascend and descend 4 steps x 2 without rails per home setup with min to mod instability most notably while ascending steps.  Pt able to self correct without assistance but required close CGA with pt self-selecting to change from an alternating pattern to a step-to pattern during second trial with noted improvement in stability.   Wheelchair Mobility    Modified Rankin (Stroke Patients Only)       Balance Overall balance assessment: Needs assistance Sitting-balance support: Feet supported Sitting balance-Leahy Scale: Normal     Standing balance support: No upper extremity supported Standing balance-Leahy Scale: Good                              Cognition Arousal/Alertness: Awake/alert Behavior During Therapy: WFL for tasks assessed/performed Overall Cognitive Status: History of cognitive impairments -  at baseline                                          Exercises      General Comments        Pertinent Vitals/Pain Pain Assessment Pain Assessment: No/denies pain    Home  Living                          Prior Function            PT Goals (current goals can now be found in the care plan section) Progress towards PT goals: Progressing toward goals    Frequency    Min 2X/week      PT Plan Current plan remains appropriate    Co-evaluation              AM-PAC PT "6 Clicks" Mobility   Outcome Measure  Help needed turning from your back to your side while in a flat bed without using bedrails?: None Help needed moving from lying on your back to sitting on the side of a flat bed without using bedrails?: None Help needed moving to and from a bed to a chair (including a wheelchair)?: None Help needed standing up from a chair using your arms (e.g., wheelchair or bedside chair)?: None Help needed to walk in hospital room?: A Little Help needed climbing 3-5 steps with a railing? : A Little 6 Click Score: 22    End of Session Equipment Utilized During Treatment: Gait belt Activity Tolerance: Patient tolerated treatment well Patient left: in chair;with call bell/phone within reach;with nursing/sitter in room;Other (comment) Psychiatrist present) Nurse Communication: Mobility status PT Visit Diagnosis: Muscle weakness (generalized) (M62.81);Unsteadiness on feet (R26.81)     Time: 1093-2355 PT Time Calculation (min) (ACUTE ONLY): 12 min  Charges:  $Gait Training: 8-22 mins                     D. Scott Rekita Miotke PT, DPT 04/30/22, 10:58 AM

## 2022-04-30 NOTE — Progress Notes (Signed)
  Progress Note   Patient: Madeline Taylor VHQ:[id number] DOB: [birth year] DOA: 04/22/2022     7 DOS: the patient was seen and examined on 04/30/2022   Brief hospital course:  Madeline Taylor is a 87 y.o. female with medical history significant of dementia, hypertension, history of TIAs presenting with encephalopathy, hypothermia  Per daughter, patient had worsening dementia, frequently Lost walking out of the house.  But normally has good appetite.  For the last week, patient had significant increased agitation, has not been sleeping well. She also had a positive COVID about a week ago, repeat COVID was negative at admission. Patient is seen by PT/OT, no need for nursing home placement.  However unsafe discharge.  The patient has vascular dementia and does not have the ability to make medical decisions or sign paperwork for herself at this time.    Assessment and Plan: * Vascular dementia Dementia with behavioral disturbance.  On Seroquel at night.  Patient answering questions appropriately.  Patient does not remember seeing me on a daily basis for the last few days.  Hypothermia Resolved.  No signs of infection  Metabolic acidosis Improved  History of TIA (transient ischemic attack) MRI brain was negative for acute intracranial abnormality.  Essential hypertension On amlodipine and losartan         Subjective: Patient feels okay.  Offers no complaints.  She does not recall seeing me in the last 2 days.  She does not remember my name.  Physical Exam: Vitals:   04/29/22 1523 04/30/22 0011 04/30/22 0715 04/30/22 1545  BP: (!) 143/69 (!) 140/64 128/69 (!) 140/70  Pulse: 76 74 71 77  Resp: 17 18 17 17   Temp: 98.7 F (37.1 C) 98.6 F (37 C) 98.3 F (36.8 C) 98.7 F (37.1 C)  TempSrc:  Oral    SpO2: 99% 98% 97% 99%  Weight:      Height:       Physical Exam HENT:     Head: Normocephalic.     Mouth/Throat:     Pharynx: No oropharyngeal exudate.  Eyes:     General: Lids are  normal.     Conjunctiva/sclera: Conjunctivae normal.  Cardiovascular:     Rate and Rhythm: Normal rate and regular rhythm.     Heart sounds: Normal heart sounds, S1 normal and S2 normal.  Pulmonary:     Breath sounds: No decreased breath sounds, wheezing, rhonchi or rales.  Abdominal:     Palpations: Abdomen is soft.     Tenderness: There is no abdominal tenderness.  Musculoskeletal:     Right lower leg: No swelling.     Left lower leg: No swelling.  Skin:    General: Skin is warm.     Findings: No rash.  Neurological:     Mental Status: She is alert.     Comments: Answers simple questions appropriately.  Patient does not recall my name or seeing me in the last 2 days.     Data Reviewed: Creatinine 0.81  Family Communication: Spoke with patient's daughter on the phone  Disposition: Status is: Inpatient Remains inpatient appropriate because: We need to find a safe disposition  Planned Discharge Destination: TOC to work on things.  May be a memory care unit.    Time spent: 27 minutes  Author: Alford Highland, MD 04/30/2022 3:46 PM  For on call review [email or web address].

## 2022-04-30 NOTE — [place] FL2 (Signed)
[place] MEDICAID FL2 LEVEL OF CARE FORM     IDENTIFICATION  Patient Name: Madeline Taylor Birthdate: Mar 09, 1935 Sex: female Admission Date (Current Location): 04/22/2022  [facility] and [place] Number:  Chiropodist and Address:  [facility], [address], [place], [place] [address]      Provider Number: [id number]  Attending Physician Name and Address:  Alford Highland, MD  Relative Name and Phone Number:  [name] daughter [phone]    Current Level of Care: Hospital Recommended Level of Care: Memory Care Prior Approval Number:    Date Approved/Denied:   PASRR Number: Pending  Discharge Plan: Other (Comment) (Memory Care)    Current Diagnoses: Patient Active Problem List   Diagnosis Date Noted   Metabolic acidosis 04/24/2022   Dementia with behavioral disturbance 04/24/2022   Hypothermia 04/23/2022   Mixed incontinence 02/12/2021   Aortic atherosclerosis 11/17/2020   Vascular dementia 11/17/2020   History of TIA (transient ischemic attack) 11/17/2020   Osteoarthritis of multiple joints 05/18/2018   Hyperlipidemia 07/29/2016   Essential hypertension 07/29/2016   Osteoporosis of femur without pathological fracture 07/29/2016    Orientation RESPIRATION BLADDER Height & Weight     Self, Place  Normal Continent Weight: 49.4 kg Height:   (149.9 cm)  BEHAVIORAL SYMPTOMS/MOOD NEUROLOGICAL BOWEL NUTRITION STATUS  Wanderer   Continent Diet (see dc summary)  AMBULATORY STATUS COMMUNICATION OF NEEDS Skin   Independent Verbally Normal                       Personal Care Assistance Level of Assistance  Bathing, Feeding, Dressing Bathing Assistance: Limited assistance Feeding assistance: Independent Dressing Assistance: Limited assistance     Functional Limitations Info  Sight, Hearing, Speech Sight Info: Adequate Hearing Info: Adequate Speech Info: Adequate    SPECIAL CARE FACTORS FREQUENCY                        Contractures Contractures Info: Not present    Additional Factors Info  Code Status, Allergies Code Status Info: full code Allergies Info: lisinipril, statins           Current Medications (04/30/2022):  This is the current hospital active medication list Current Facility-Administered Medications  Medication Dose Route Frequency Provider Last Rate Last Admin   alum & mag hydroxide-simeth (MAALOX/MYLANTA) 200-200-20 MG/5ML suspension 30 mL  30 mL Oral Q6H PRN Sharman Cheek, MD       amLODipine (NORVASC) tablet 5 mg  5 mg Oral Daily Marrion Coy, MD   5 mg at 04/30/22 1002   enoxaparin (LOVENOX) injection 40 mg  40 mg Subcutaneous Q24H Floydene Flock, MD   40 mg at 04/29/22 2214   haloperidol lactate (HALDOL) injection 1 mg  1 mg Intravenous Q6H PRN Marrion Coy, MD       ibuprofen (ADVIL) tablet 600 mg  600 mg Oral Q8H PRN Sharman Cheek, MD   600 mg at 04/23/22 1923   losartan (COZAAR) tablet 50 mg  50 mg Oral Daily Marrion Coy, MD   50 mg at 04/30/22 1002   ondansetron (ZOFRAN) tablet 4 mg  4 mg Oral Q6H PRN Floydene Flock, MD       Or   ondansetron [facility]) injection 4 mg  4 mg Intravenous Q6H PRN Floydene Flock, MD       QUEtiapine (SEROQUEL) tablet 25 mg  25 mg Oral QHS Marrion Coy, MD   25 mg  at 04/29/22 2214     Discharge Medications: Please see discharge summary for a list of discharge medications.  Relevant Imaging Results:  Relevant Lab Results:   Additional Information SS# [id number]  Marlowe Sax, RN

## 2022-05-01 DIAGNOSIS — F01B18 Vascular dementia, moderate, with other behavioral disturbance: Secondary | ICD-10-CM | POA: Diagnosis not present

## 2022-05-01 DIAGNOSIS — E872 Acidosis, unspecified: Secondary | ICD-10-CM | POA: Diagnosis not present

## 2022-05-01 DIAGNOSIS — I10 Essential (primary) hypertension: Secondary | ICD-10-CM | POA: Diagnosis not present

## 2022-05-01 DIAGNOSIS — Z8673 Personal history of transient ischemic attack (TIA), and cerebral infarction without residual deficits: Secondary | ICD-10-CM | POA: Diagnosis not present

## 2022-05-01 DIAGNOSIS — T68XXXS Hypothermia, sequela: Secondary | ICD-10-CM | POA: Diagnosis not present

## 2022-05-01 LAB — RPR: RPR Ser Ql: NONREACTIVE

## 2022-05-01 LAB — CBC
HCT: 38.4 % (ref 36.0–46.0)
Hemoglobin: 12.1 g/dL (ref 12.0–15.0)
MCH: 28.9 pg (ref 26.0–34.0)
MCHC: 31.5 g/dL (ref 30.0–36.0)
MCV: 91.6 fL (ref 80.0–100.0)
Platelets: 245 10*3/uL (ref 150–400)
RBC: 4.19 MIL/uL (ref 3.87–5.11)
RDW: 12.6 % (ref 11.5–15.5)
WBC: 6.1 10*3/uL (ref 4.0–10.5)
nRBC: 0 % (ref 0.0–0.2)

## 2022-05-01 NOTE — Progress Notes (Signed)
  Progress Note   Patient: Madeline Taylor GQB:[id number] DOB: Nov 15, 1935 DOA: 04/22/2022     8 DOS: the patient was seen and examined on 05/01/2022   Brief hospital course:  GERLEAN GILLON is a 87 y.o. female with medical history significant of dementia, hypertension, history of TIAs presenting with encephalopathy, hypothermia  Per daughter, patient had worsening dementia, frequently Lost walking out of the house.  But normally has good appetite.  For the last week, patient had significant increased agitation, has not been sleeping well. She also had a positive COVID about a week ago, repeat COVID was negative at admission. Patient is seen by PT/OT, no need for nursing home placement.  However unsafe discharge.  The patient has vascular dementia and does not have the ability to make medical decisions or sign paperwork for herself at this time.    Assessment and Plan: * Vascular dementia Dementia with behavioral disturbance.  On Seroquel at night.  Patient answering questions appropriately.  Patient does not remember my name despite me seeing her for the last 4 days.  Essential hypertension On amlodipine and losartan   History of TIA (transient ischemic attack) MRI brain was negative for acute intracranial abnormality.  Hypothermia Resolved.  No signs of infection  Metabolic acidosis Improved        Subjective: Patient feels okay.  Did not remember my name again.  Admitted with altered mental status with dementia  Physical Exam: Vitals:   04/30/22 0715 04/30/22 1545 04/30/22 2146 05/01/22 0054  BP: 128/69 (!) 140/70 (!) 154/82 (!) 149/81  Pulse: 71 77 76 98  Resp: 17 17 17 16   Temp: 98.3 F (36.8 C) 98.7 F (37.1 C) 98.5 F (36.9 C)   TempSrc:      SpO2: 97% 99% 97% 98%  Weight:      Height:       Physical Exam HENT:     Head: Normocephalic.     Mouth/Throat:     Pharynx: No oropharyngeal exudate.  Eyes:     General: Lids are normal.     Conjunctiva/sclera:  Conjunctivae normal.  Cardiovascular:     Rate and Rhythm: Normal rate and regular rhythm.     Heart sounds: Normal heart sounds, S1 normal and S2 normal.  Pulmonary:     Breath sounds: No decreased breath sounds, wheezing, rhonchi or rales.  Abdominal:     Palpations: Abdomen is soft.     Tenderness: There is no abdominal tenderness.  Musculoskeletal:     Right lower leg: No swelling.     Left lower leg: No swelling.  Skin:    General: Skin is warm.     Findings: No rash.  Neurological:     Mental Status: She is alert.     Comments: Answers simple questions appropriately.  Patient does not recall my name.     Data Reviewed: BUN 26, vitamin B12 385, RPR nonreactive, last CBC normal range  Family Communication: Updated patient's daughter on the phone  Disposition: Status is: Inpatient Remains inpatient appropriate because: We need a safe disposition.  Planned Discharge Destination: To be determined    Time spent: 27 minutes  Author: Alford Highland, MD 05/01/2022 1:41 PM  For on call review [email or web address].

## 2022-05-01 NOTE — Plan of Care (Signed)
  Problem: Nutrition: Goal: Adequate nutrition will be maintained Outcome: Progressing   Problem: Elimination: Goal: Will not experience complications related to bowel motility Outcome: Progressing Goal: Will not experience complications related to urinary retention Outcome: Progressing   

## 2022-05-01 NOTE — Progress Notes (Signed)
Mobility Specialist - Progress Note   05/01/22 1048  Mobility  Activity Ambulated independently in hallway  Level of Assistance Independent  Assistive Device None  Distance Ambulated (ft) 1000 ft  Activity Response Tolerated well  Mobility Referral Yes  $Mobility charge 1 Mobility   Pt OOB in hallway ambulating with NT upon arrival. Pt ambulates with author to main entrance and back indep with no LOB noted. Pt returns to room with sitter present.   Madeline Taylor  Mobility Specialist  05/01/22 10:49 AM

## 2022-05-01 NOTE — Progress Notes (Signed)
Mobility Specialist - Progress Note   05/01/22 1006  Mobility  Activity Ambulated independently in room  Level of Assistance Independent  Assistive Device None  Distance Ambulated (ft) 10 ft  Activity Response Tolerated well  Mobility Referral Yes  $Mobility charge 1 Mobility   Pt OOB in bathroom upon arrival. Pt ambulates back to bed indep. Pt defers any additional mobility at this time.   Terrilyn Saver  Mobility Specialist  05/01/22 10:07 AM

## 2022-05-02 DIAGNOSIS — F01B18 Vascular dementia, moderate, with other behavioral disturbance: Secondary | ICD-10-CM | POA: Diagnosis not present

## 2022-05-02 MED ORDER — ORAL CARE MOUTH RINSE: 15.0000 mL | OROMUCOSAL | Status: DC | PRN

## 2022-05-02 NOTE — Progress Notes (Signed)
  Progress Note   Patient: Madeline Taylor [id number]:[id number] DOB: 05/31/1935 DOA: 04/22/2022     9 DOS: the patient was seen and examined on 05/02/2022   Brief hospital course:  DESHAI CRUVER is a 87 y.o. female with medical history significant of dementia, hypertension, history of TIAs presenting with encephalopathy, hypothermia  Per daughter, patient had worsening dementia, frequently Lost walking out of the house.  But normally has good appetite.  For the last week, patient had significant increased agitation, has not been sleeping well. She also had a positive COVID about a week ago, repeat COVID was negative at admission. Patient is seen by PT/OT, no need for nursing home placement.  However unsafe discharge.  The patient has vascular dementia and does not have the ability to make medical decisions or sign paperwork for herself at this time.    Assessment and Plan: * Vascular dementia Dementia with behavioral disturbance.  On Seroquel at night.  Patient answering questions appropriately.  Patient does not remember my name despite me seeing her for the last 4 days.  Patient has not had any behavioral issues since being in the hospital.  Very calm and cooperative.  Essential hypertension On amlodipine and losartan   History of TIA (transient ischemic attack) MRI brain was negative for acute intracranial abnormality.  Hypothermia Resolved.  No signs of infection  Metabolic acidosis Improved        Subjective: Patient does not remember my name.  She did call me Dr. Upon me leaving the room.  Feels fantastic.  Admitted with vascular dementia  Physical Exam: Vitals:   05/01/22 0054 05/01/22 1533 05/01/22 1943 05/02/22 0919  BP: (!) 149/81 138/68 (!) 155/81 (!) 142/68  Pulse: 98 83 85 83  Resp: 16 18  18   Temp:  97.8 F (36.6 C) 98 F (36.7 C) 98.3 F (36.8 C)  TempSrc:   Oral   SpO2: 98% 99% 100% 100%  Weight:      Height:       Physical Exam HENT:     Head: Normocephalic.      Mouth/Throat:     Pharynx: No oropharyngeal exudate.  Eyes:     General: Lids are normal.     Conjunctiva/sclera: Conjunctivae normal.  Cardiovascular:     Rate and Rhythm: Normal rate and regular rhythm.     Heart sounds: Normal heart sounds, S1 normal and S2 normal.  Pulmonary:     Breath sounds: No decreased breath sounds, wheezing, rhonchi or rales.  Abdominal:     Palpations: Abdomen is soft.     Tenderness: There is no abdominal tenderness.  Musculoskeletal:     Right lower leg: No swelling.     Left lower leg: No swelling.  Skin:    General: Skin is warm.     Findings: No rash.  Neurological:     Mental Status: She is alert.     Comments: Answers simple questions appropriately.  Patient does not recall my name.     Data Reviewed: Last hemoglobin 12.1  Family Communication: Updated patient's daughter on the phone  Disposition: Status is: Inpatient Remains inpatient appropriate because: Trying to find a safe disposition  Planned Discharge Destination: Potentially memory care unit    Time spent: 27 minutes  Author: Alford Highland, MD 05/02/2022 1:18 PM  For on call review [email or web address].

## 2022-05-02 NOTE — Progress Notes (Signed)
Mobility Specialist - Progress Note   05/02/22 1313  Mobility  Activity Ambulated independently in hallway;Stood at bedside;Dangled on edge of bed  Level of Assistance Independent  Assistive Device None  Distance Ambulated (ft) 1000 ft  Activity Response Tolerated well  Mobility Referral Yes  $Mobility charge 1 Mobility   Pt sitting EOB on RA upon arrival. Pt STS and ambulates in hallway indep. Pt returns to room with NT present.   Terrilyn Saver  Mobility Specialist  05/02/22 1:14 PM

## 2022-05-02 NOTE — Plan of Care (Signed)

## 2022-05-03 DIAGNOSIS — Z8673 Personal history of transient ischemic attack (TIA), and cerebral infarction without residual deficits: Secondary | ICD-10-CM | POA: Diagnosis not present

## 2022-05-03 DIAGNOSIS — I10 Essential (primary) hypertension: Secondary | ICD-10-CM | POA: Diagnosis not present

## 2022-05-03 DIAGNOSIS — T68XXXS Hypothermia, sequela: Secondary | ICD-10-CM | POA: Diagnosis not present

## 2022-05-03 DIAGNOSIS — E872 Acidosis, unspecified: Secondary | ICD-10-CM | POA: Diagnosis not present

## 2022-05-03 DIAGNOSIS — F01B18 Vascular dementia, moderate, with other behavioral disturbance: Secondary | ICD-10-CM | POA: Diagnosis not present

## 2022-05-03 MED ORDER — RISPERIDONE 1 MG PO TABS
1.0000 mg | ORAL_TABLET | Freq: Two times a day (BID) | ORAL | Status: DC | PRN
  Administered 2022-05-08 – 2022-07-17 (×16): 1 mg via ORAL
  Filled 2022-05-03 (×23): qty 1

## 2022-05-03 NOTE — Progress Notes (Signed)
Occupational Therapy Treatment Patient Details Name: Madeline Taylor MRN: [id number] DOB: [birth year] Today's Date: 05/03/2022   History of present illness Pt is an 87 y/o F admitted on 04/22/22 after presenting with encephalopathy & hypothermia. Pt had COVID about a week ago with worsening behavior since then, as well as sleep deprivation. PMH: dementia, HTN, TIAs   OT comments  Pt seen for OT tx this date. Pt in bed, agreeable to session, denies pain. Pt required 1 verbal cue to initiate task of washing her hands and face. Pt completed bed mobility with modified independence and stood at the sink to wash her hands and wash her face. 1 verbal cue required during task as she first picked up body lotion instead of the body wash. Supv for safety, no LOB noted. Pt tolerated dual tasking with cognitive challenge during grooming task well. Pt able to converse while completing task and answer questions without stopping. Pt declined additional needs. Pt left in bed all needs in reach. Progressing, recommendations remain appropriate.    Recommendations for follow up therapy are one component of a multi-disciplinary discharge planning process, led by the attending physician.  Recommendations may be updated based on patient status, additional functional criteria and insurance authorization.    Assistance Recommended at Discharge Frequent or constant Supervision/Assistance  Patient can return home with the following  A little help with walking and/or transfers;A little help with bathing/dressing/bathroom;Assistance with cooking/housework;Direct supervision/assist for medications management;Direct supervision/assist for financial management;Assist for transportation;Help with stairs or ramp for entrance   Equipment Recommendations  None recommended by OT    Recommendations for Other Services      Precautions / Restrictions Precautions Precautions: Fall Restrictions Weight Bearing Restrictions: No        Mobility Bed Mobility Overal bed mobility: Modified Independent                  Transfers Overall transfer level: Modified independent Equipment used: None Transfers: Sit to/from Stand Sit to Stand: Supervision                 Balance Overall balance assessment: Needs assistance Sitting-balance support: Feet supported Sitting balance-Leahy Scale: Normal     Standing balance support: No upper extremity supported Standing balance-Leahy Scale: Good                             ADL either performed or assessed with clinical judgement   ADL Overall ADL's : Needs assistance/impaired     Grooming: Set up;Supervision/safety;Standing;Wash/dry face;Wash/dry Programmer, applications Details (indicate cue type and reason): Pt stood at the sink to wash her hands and wash her face. 1 verbal cue required as she first picked up body lotion instead of the body wash. supv for safety                                    Extremity/Trunk Assessment              Vision       Perception     Praxis      Cognition Arousal/Alertness: Awake/alert Behavior During Therapy: WFL for tasks assessed/performed Overall Cognitive Status: History of cognitive impairments - at baseline  Exercises      Shoulder Instructions       General Comments      Pertinent Vitals/ Pain       Pain Assessment Pain Assessment: No/denies pain  Home Living                                          Prior Functioning/Environment              Frequency  Min 1X/week        Progress Toward Goals  OT Goals(current goals can now be found in the care plan section)  Progress towards OT goals: Progressing toward goals  Acute Rehab OT Goals Patient Stated Goal: get better OT Goal Formulation: With patient Time For Goal Achievement: 05/08/22 Potential to Achieve Goals: Good  Plan Discharge  plan remains appropriate;Frequency remains appropriate    Co-evaluation                 AM-PAC OT "6 Clicks" Daily Activity     Outcome Measure   Help from another person eating meals?: None Help from another person taking care of personal grooming?: A Little Help from another person toileting, which includes using toliet, bedpan, or urinal?: A Little Help from another person bathing (including washing, rinsing, drying)?: A Little Help from another person to put on and taking off regular upper body clothing?: None Help from another person to put on and taking off regular lower body clothing?: A Little 6 Click Score: 20    End of Session    OT Visit Diagnosis: Unsteadiness on feet (R26.81)   Activity Tolerance Patient tolerated treatment well   Patient Left in bed;with call bell/phone within reach (bed alarm off at start of session, left off)   Nurse Communication          Time: 1610-9604 OT Time Calculation (min): 9 min  Charges: OT General Charges $OT Visit: 1 Visit OT Treatments $Self Care/Home Management : 8-22 mins  Arman Filter., MPH, MS, OTR/L ascom [phone] 05/03/22, 10:11 AM

## 2022-05-03 NOTE — Progress Notes (Signed)
  Progress Note   Patient: Madeline Taylor ZOX:[id number] DOB: 03-Feb-1935 DOA: 04/22/2022     10 DOS: the patient was seen and examined on 05/03/2022   Brief hospital course:  Madeline Taylor is a 87 y.o. female with medical history significant of dementia, hypertension, history of TIAs presenting with encephalopathy, hypothermia  Per daughter, patient had worsening dementia, frequently Lost walking out of the house.  But normally has good appetite.  For the last week, patient had significant increased agitation, has not been sleeping well. She also had a positive COVID about a week ago, repeat COVID was negative at admission. Patient is seen by PT/OT, no need for nursing home placement.  However unsafe discharge.  The patient has vascular dementia and does not have the ability to make medical decisions or sign paperwork for herself at this time.    Assessment and Plan: * Vascular dementia Dementia with behavioral disturbance.  On Seroquel at night.  Patient answering questions appropriately.  Patient does not remember my name.  Patient has not had any behavioral issues since being in the hospital.  Patient very pleasant and cooperative.  Essential hypertension On amlodipine and losartan   History of TIA (transient ischemic attack) MRI brain was negative for acute intracranial abnormality.  Hypothermia Resolved.  No signs of infection  Metabolic acidosis Improved        Subjective: Patient feels fine.  Offers no complaints.  Patient walking in hallway.  Patient wanting to get out of the hospital.  Physical Exam: Vitals:   05/02/22 0919 05/02/22 1800 05/02/22 2258 05/03/22 0852  BP: (!) 142/68 (!) 169/66 (!) 151/73 130/65  Pulse: 83 88 77 75  Resp: 18 18 18 16   Temp: 98.3 F (36.8 C) 97.9 F (36.6 C) 98.7 F (37.1 C) 98.2 F (36.8 C)  TempSrc:   Oral   SpO2: 100% 98% 97% 98%  Weight:      Height:       Physical Exam HENT:     Head: Normocephalic.     Mouth/Throat:      Pharynx: No oropharyngeal exudate.  Eyes:     General: Lids are normal.     Conjunctiva/sclera: Conjunctivae normal.  Cardiovascular:     Rate and Rhythm: Normal rate and regular rhythm.     Heart sounds: Normal heart sounds, S1 normal and S2 normal.  Pulmonary:     Breath sounds: No decreased breath sounds, wheezing, rhonchi or rales.  Abdominal:     Palpations: Abdomen is soft.     Tenderness: There is no abdominal tenderness.  Musculoskeletal:     Right lower leg: No swelling.     Left lower leg: No swelling.  Skin:    General: Skin is warm.     Findings: No rash.  Neurological:     Mental Status: She is alert.     Comments: Answers simple questions appropriately.      Data Reviewed: No new data today Family Communication: Updated patient's daughter yesterday  Disposition: Status is: Inpatient Remains inpatient appropriate because: Unsafe discharge home.  TOC looking into memory care unit.  Planned Discharge Destination: Potential memory care unit    Time spent: 26 minutes  Author: Alford Highland, MD 05/03/2022 12:13 PM  For on call review [email or web address].

## 2022-05-03 NOTE — Plan of Care (Signed)

## 2022-05-03 NOTE — Progress Notes (Signed)
PT Cancellation Note  Patient Details Name: Madeline Taylor MRN: [id number] DOB: September 29, 1935   Cancelled Treatment:     Therapist in to see pt who politely declined stating she had walked 3 times already today, nursing verified. Will continue to f/u acutely per POC until d/c.   Jannet Askew 05/03/2022, 2:30 PM

## 2022-05-04 ENCOUNTER — Inpatient Hospital Stay: Payer: Medicare Other

## 2022-05-04 DIAGNOSIS — E872 Acidosis, unspecified: Secondary | ICD-10-CM | POA: Diagnosis not present

## 2022-05-04 DIAGNOSIS — S0003XA Contusion of scalp, initial encounter: Secondary | ICD-10-CM | POA: Diagnosis not present

## 2022-05-04 DIAGNOSIS — I10 Essential (primary) hypertension: Secondary | ICD-10-CM | POA: Diagnosis not present

## 2022-05-04 DIAGNOSIS — T68XXXS Hypothermia, sequela: Secondary | ICD-10-CM | POA: Diagnosis not present

## 2022-05-04 DIAGNOSIS — Z8673 Personal history of transient ischemic attack (TIA), and cerebral infarction without residual deficits: Secondary | ICD-10-CM | POA: Diagnosis not present

## 2022-05-04 DIAGNOSIS — F01B18 Vascular dementia, moderate, with other behavioral disturbance: Secondary | ICD-10-CM | POA: Diagnosis not present

## 2022-05-04 NOTE — Plan of Care (Signed)

## 2022-05-04 NOTE — Progress Notes (Signed)
       CROSS COVER NOTE  NAME: LANDRIE BEALE MRN: [id number] DOB : 03/30/1935 ATTENDING PHYSICIAN: Alford Highland, MD    Notified by nursing that patient has fallen out of bed.  No loss of consciousness or change in sensorium that precipitated the fall.  Patient is not complaining of any pain and she has a (L) forehead hematoma noted after fall. The fall was witnessed and nursing reports patient hit her head. Per report patient had wandered into someone else room and was being redirected back to her room and both she and staff fell. CT Head ordered and negative. We will continue to monitor and consider additional testing if patient begins to exhibit any symptoms of  pain or change from neurological baseline.   To reach the provider On-Call:   7AM- 7PM see care teams to locate the attending and reach out to them via [email or web address]. Password: [email or web address] 7PM-7AM contact night-coverage If you still have difficulty reaching the appropriate provider, please page the [facility] (Director on Call) for [facility] on amion for assistance  This document was prepared using Conservation officer, historic buildings and may include unintentional dictation errors.  Bishop Limbo DNP, MBA, FNP-BC, PMHNP-BC Nurse Practitioner [facility] Pager [phone]

## 2022-05-04 NOTE — Progress Notes (Signed)
  Progress Note   Patient: Madeline Taylor ZOX:[id number] DOB: 20-Jul-1935 DOA: 04/22/2022     11 DOS: the patient was seen and examined on 05/04/2022   Brief hospital course:  ROXANE PUERTO is a 87 y.o. female with medical history significant of dementia, hypertension, history of TIAs presenting with encephalopathy, hypothermia  Per daughter, patient had worsening dementia, frequently Lost walking out of the house.  But normally has good appetite.  For the last week, patient had significant increased agitation, has not been sleeping well. She also had a positive COVID about a week ago, repeat COVID was negative at admission. Patient is seen by PT/OT, no need for nursing home placement.  However unsafe discharge.  The patient has vascular dementia and does not have the ability to make medical decisions or sign paperwork for herself at this time.    Assessment and Plan: * Vascular dementia Dementia with behavioral disturbance.  On Seroquel at night.  Risperdal as needed during the day if needed.  Patient answering questions appropriately.  Patient does not remember my name.  Patient calm and cooperative this morning.  Essential hypertension On amlodipine and losartan   History of TIA (transient ischemic attack) MRI brain was negative for acute intracranial abnormality.  Hypothermia Resolved.  No signs of infection  Metabolic acidosis Improved        Subjective: Patient feels fine.  Offers no complaints.  Wanting to get out of the hospital.  Admitted with unsafe environment at home and altered mental status.  Physical Exam: Vitals:   05/02/22 2258 05/03/22 0852 05/03/22 1712 05/04/22 0744  BP: (!) 151/73 130/65 (!) 154/79 (!) 147/91  Pulse: 77 75 80 72  Resp: Temp: 98.7 F (37.1 C) 98.2 F (36.8 C) 98.4 F (36.9 C) 98.1 F (36.7 C)  TempSrc: Oral     SpO2: 97% 98% 99% 98%  Weight:      Height:       Physical Exam HENT:     Head: Normocephalic.      Mouth/Throat:     Pharynx: No oropharyngeal exudate.  Eyes:     General: Lids are normal.     Conjunctiva/sclera: Conjunctivae normal.  Cardiovascular:     Rate and Rhythm: Normal rate and regular rhythm.     Heart sounds: Normal heart sounds, S1 normal and S2 normal.  Pulmonary:     Breath sounds: No decreased breath sounds, wheezing, rhonchi or rales.  Abdominal:     Palpations: Abdomen is soft.     Tenderness: There is no abdominal tenderness.  Musculoskeletal:     Right lower leg: No swelling.     Left lower leg: No swelling.  Skin:    General: Skin is warm.     Findings: No rash.  Neurological:     Mental Status: She is alert.     Comments: Answers simple questions appropriately.      Data Reviewed: No new data  Family Communication: Updated patient's daughter on the phone  Disposition: Status is: Inpatient Remains inpatient appropriate because: We do not have a safe disposition.  Medically stable for discharge.  TOC looking into the next place to be.  Planned Discharge Destination: Potentially memory care    Time spent: 27 minutes  Author: Alford Highland, MD 05/04/2022 12:09 PM  For on call review [email or web address].

## 2022-05-04 NOTE — Progress Notes (Signed)
Physical Therapy Treatment Patient Details Name: Madeline Taylor MRN: [id number] DOB: 05-23-1935 Today's Date: 05/04/2022   History of Present Illness Pt is an 87 y/o F admitted on 04/22/22 after presenting with encephalopathy & hypothermia. Pt had COVID about a week ago with worsening behavior since then, as well as sleep deprivation. PMH: dementia, HTN, TIAs    PT Comments    Pt was pleasant and motivated to participate during the session and put forth good effort throughout. Pt continued to amb without an AD with good stability including during start/stops and sharp turns.  Pt was able to ascend and descend 4 steps x 3 per below with no rail use to simulate home environment with min instability but no physical assist needed to prevent LOB but close CGA recommended.  No skilled PT services recommended at discharge.      Recommendations for follow up therapy are one component of a multi-disciplinary discharge planning process, led by the attending physician.  Recommendations may be updated based on patient status, additional functional criteria and insurance authorization.  Follow Up Recommendations  Can patient physically be transported by private vehicle: Yes    Assistance Recommended at Discharge Frequent or constant Supervision/Assistance  Patient can return home with the following A little help with walking and/or transfers;A little help with bathing/dressing/bathroom;Assistance with cooking/housework;Assist for transportation;Help with stairs or ramp for entrance;Direct supervision/assist for medications management;Direct supervision/assist for financial management   Equipment Recommendations  None recommended by PT    Recommendations for Other Services       Precautions / Restrictions Precautions Precautions: Fall Restrictions Weight Bearing Restrictions: No     Mobility  Bed Mobility Overal bed mobility: Modified Independent             General bed mobility comments:  Min extra time and effort only    Transfers Overall transfer level: Modified independent                 General transfer comment: Good eccentric and concentric control and stability from multiple height surfaces    Ambulation/Gait Ambulation/Gait assistance: Supervision Gait Distance (Feet): 300 Feet Assistive device: None Gait Pattern/deviations: WFL(Within Functional Limits) Gait velocity: WNL     General Gait Details: Pt steady without LOB throughout   Stairs Stairs: Yes Stairs assistance: Min guard Stair Management: Alternating pattern, Step to pattern, Forwards, No rails Number of Stairs: 12 General stair comments: Pt able to ascend and descend 4 steps x 3 without rails per home setup with min instability most notably while ascending steps.  Pt able to self correct without assistance but required close CGA with pt self-selecting to perform alternating pattern ascending and step-to pattern descending.   Wheelchair Mobility    Modified Rankin (Stroke Patients Only)       Balance Overall balance assessment: Needs assistance Sitting-balance support: Feet supported Sitting balance-Leahy Scale: Normal     Standing balance support: No upper extremity supported Standing balance-Leahy Scale: Good                              Cognition Arousal/Alertness: Awake/alert Behavior During Therapy: WFL for tasks assessed/performed Overall Cognitive Status: History of cognitive impairments - at baseline                                          Exercises Other Exercises  Other Exercises: Sit to/from stand training from EOB, recliner, and toilet    General Comments        Pertinent Vitals/Pain Pain Assessment Pain Assessment: No/denies pain    Home Living                          Prior Function            PT Goals (current goals can now be found in the care plan section) Progress towards PT goals: Progressing toward  goals    Frequency    Min 2X/week      PT Plan Current plan remains appropriate    Co-evaluation              AM-PAC PT "6 Clicks" Mobility   Outcome Measure  Help needed turning from your back to your side while in a flat bed without using bedrails?: None Help needed moving from lying on your back to sitting on the side of a flat bed without using bedrails?: None Help needed moving to and from a bed to a chair (including a wheelchair)?: None Help needed standing up from a chair using your arms (e.g., wheelchair or bedside chair)?: None Help needed to walk in hospital room?: A Little Help needed climbing 3-5 steps with a railing? : A Little 6 Click Score: 22    End of Session Equipment Utilized During Treatment: Gait belt Activity Tolerance: Patient tolerated treatment well Patient left: in chair;with call bell/phone within reach;with nursing/sitter in room Nurse Communication: Mobility status PT Visit Diagnosis: Muscle weakness (generalized) (M62.81);Unsteadiness on feet (R26.81)     Time: 1191-4782 PT Time Calculation (min) (ACUTE ONLY): 15 min  Charges:  $Gait Training: 8-22 mins                     D. Scott Advit Trethewey PT, DPT 05/04/22, 2:46 PM
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
## 2022-05-05 DIAGNOSIS — Z8673 Personal history of transient ischemic attack (TIA), and cerebral infarction without residual deficits: Secondary | ICD-10-CM | POA: Diagnosis not present

## 2022-05-05 DIAGNOSIS — F03918 Unspecified dementia, unspecified severity, with other behavioral disturbance: Secondary | ICD-10-CM | POA: Diagnosis not present

## 2022-05-05 LAB — BASIC METABOLIC PANEL
Anion gap: 7 (ref 5–15)
BUN: 21 mg/dL (ref 8–23)
CO2: 24 mmol/L (ref 22–32)
Calcium: 8.9 mg/dL (ref 8.9–10.3)
Chloride: 107 mmol/L (ref 98–111)
Creatinine, Ser: 0.8 mg/dL (ref 0.44–1.00)
GFR, Estimated: >60 mL/min (ref >60)
Glucose, Bld: 95 mg/dL (ref 70–99)
Potassium: 4.3 mmol/L (ref 3.5–5.1)
Sodium: 138 mmol/L (ref 135–145)

## 2022-05-05 LAB — CBC
HCT: 37.7 % (ref 36.0–46.0)
Hemoglobin: 12 g/dL (ref 12.0–15.0)
MCH: 29.1 pg (ref 26.0–34.0)
MCHC: 31.8 g/dL (ref 30.0–36.0)
MCV: 91.3 fL (ref 80.0–100.0)
Platelets: 247 10*3/uL (ref 150–400)
RBC: 4.13 MIL/uL (ref 3.87–5.11)
RDW: 12.9 % (ref 11.5–15.5)
WBC: 6.1 10*3/uL (ref 4.0–10.5)
nRBC: 0 % (ref 0.0–0.2)

## 2022-05-05 MED ORDER — KETOROLAC TROMETHAMINE 15 MG/ML IJ SOLN: 15.0000 mg | Freq: Once | INTRAMUSCULAR | Status: DC

## 2022-05-05 MED ORDER — ACETAMINOPHEN 500 MG PO TABS
1000.0000 mg | ORAL_TABLET | Freq: Once | ORAL | Status: AC
  Administered 2022-05-05: 1000 mg via ORAL
  Filled 2022-05-05: qty 2

## 2022-05-05 NOTE — Progress Notes (Signed)
  Progress Note   Patient: Madeline Taylor ZOX:[id number] DOB: 1935/07/20 DOA: 04/22/2022     12 DOS: the patient was seen and examined on 05/05/2022   Brief hospital course:  Madeline Taylor is a 87 y.o. female with medical history significant of dementia, hypertension, history of TIAs presenting with encephalopathy, hypothermia  Per daughter, patient had worsening dementia, frequently Lost walking out of the house.  But normally has good appetite.  For the last week, patient had significant increased agitation, has not been sleeping well. She also had a positive COVID about a week ago, repeat COVID was negative at admission. Patient is seen by PT/OT, no need for nursing home placement.  However unsafe discharge.  The patient has vascular dementia and does not have the ability to make medical decisions or sign paperwork for herself at this time.     Principal Problem:   Vascular dementia Active Problems:   Essential hypertension   History of TIA (transient ischemic attack)   Hypothermia   Osteoarthritis of multiple joints   Metabolic acidosis   Dementia with behavioral disturbance   Assessment and Plan: * Vascular dementia Dementia with behavioral disturbance.  On Seroquel at night.  Risperdal as needed during the day. Condition has been stable.  Essential hypertension On amlodipine and losartan   History of TIA (transient ischemic attack) MRI brain was negative for acute intracranial abnormality.  Hypothermia Resolved.  No signs of infection  Metabolic acidosis Improved       Subjective:  Patient doing well today, no agitation.  Slept well last night.  Physical Exam: Vitals:   05/03/22 0852 05/03/22 1712 05/04/22 0744 05/05/22 0742  BP: 130/65 (!) 154/79 (!) 147/91 (!) 152/81  Pulse: 75 80 72 65  Resp: Temp: 98.2 F (36.8 C) 98.4 F (36.9 C) 98.1 F (36.7 C) 97.7 F (36.5 C)  TempSrc:      SpO2: 98% 99% 98% 100%  Weight:      Height:        General exam: Appears calm and comfortable  Respiratory system: Clear to auscultation. Respiratory effort normal. Cardiovascular system: S1 & S2 heard, RRR. No JVD, murmurs, rubs, gallops or clicks. No pedal edema. Gastrointestinal system: Abdomen is nondistended, soft and nontender. No organomegaly or masses felt. Normal bowel sounds heard. Central nervous system: Alert and oriented x2. No focal neurological deficits. Extremities: Symmetric 5 x 5 power. Skin: No rashes, lesions or ulcers Psychiatry: Judgement and insight appear normal. Mood & affect appropriate.    Data Reviewed:  None new  Family Communication: None  Disposition: Status is: Inpatient Remains inpatient appropriate because: Unsafe discharge.     Time spent: 35 minutes  Author: Marrion Coy, MD 05/05/2022 1:23 PM  For on call review [email or web address].

## 2022-05-05 NOTE — Progress Notes (Signed)
Physical Therapy Treatment Patient Details Name: Madeline Taylor MRN: [id number] DOB: 10-17-1935 Today's Date: 05/05/2022   History of Present Illness Pt is an 87 y/o F admitted on 04/22/22 after presenting with encephalopathy & hypothermia. Pt had COVID about a week ago with worsening behavior since then, as well as sleep deprivation. PMH: dementia, HTN, TIAs    PT Comments    Pt was pleasant and motivated to participate during the session and put forth good effort throughout. Pt able to amb with good stability without AD and without overt LOB including during start/stops, 180 deg turns, and while ambulating on uneven terrain outdoors.  Pt remained unsteady during stair training without the use of rails and needed min A on one occasional while ascending steps to prevent LOB.  Pt will benefit from constant supervision at discharge secondary to cognitive deficits but will not require skilled PT services.      Recommendations for follow up therapy are one component of a multi-disciplinary discharge planning process, led by the attending physician.  Recommendations may be updated based on patient status, additional functional criteria and insurance authorization.  Follow Up Recommendations  Can patient physically be transported by private vehicle: Yes    Assistance Recommended at Discharge Frequent or constant Supervision/Assistance  Patient can return home with the following A little help with walking and/or transfers;A little help with bathing/dressing/bathroom;Assistance with cooking/housework;Assist for transportation;Help with stairs or ramp for entrance;Direct supervision/assist for medications management;Direct supervision/assist for financial management   Equipment Recommendations  None recommended by PT    Recommendations for Other Services       Precautions / Restrictions Precautions Precautions: Fall Restrictions Weight Bearing Restrictions: No     Mobility  Bed Mobility                General bed mobility comments: NT, pt found sitting at EOB    Transfers Overall transfer level: Independent                 General transfer comment: Good eccentric and concentric control and stability from multiple height surfaces    Ambulation/Gait Ambulation/Gait assistance: Supervision Gait Distance (Feet): 600 Feet x1, 200 Feet x 1  Assistive device: None Gait Pattern/deviations: WFL(Within Functional Limits) Gait velocity: WNL     General Gait Details: Pt steady without LOB throughout including during amb on uneven terrain outdoors and during start/stops and 180 deg turns   Stairs Stairs: Yes Stairs assistance: Min assist Stair Management: Alternating pattern, Step to pattern, Forwards, No rails Number of Stairs: 8 General stair comments: Pt able to ascend and descend 4 steps x 2 without rails per home setup with min instability most notably while ascending steps requiring min A on one occasion to prevent LOB.  Pt self-selected alternating pattern ascending and step-to pattern descending.   Wheelchair Mobility    Modified Rankin (Stroke Patients Only)       Balance Overall balance assessment: Needs assistance Sitting-balance support: Feet supported Sitting balance-Leahy Scale: Normal     Standing balance support: No upper extremity supported Standing balance-Leahy Scale: Good                              Cognition Arousal/Alertness: Awake/alert Behavior During Therapy: WFL for tasks assessed/performed Overall Cognitive Status: History of cognitive impairments - at baseline  Exercises      General Comments        Pertinent Vitals/Pain Pain Assessment Pain Assessment: No/denies pain    Home Living                          Prior Function            PT Goals (current goals can now be found in the care plan section) Progress towards PT goals:  Progressing toward goals    Frequency    Min 2X/week      PT Plan Current plan remains appropriate    Co-evaluation              AM-PAC PT "6 Clicks" Mobility   Outcome Measure  Help needed turning from your back to your side while in a flat bed without using bedrails?: None Help needed moving from lying on your back to sitting on the side of a flat bed without using bedrails?: None Help needed moving to and from a bed to a chair (including a wheelchair)?: None Help needed standing up from a chair using your arms (e.g., wheelchair or bedside chair)?: None Help needed to walk in hospital room?: A Little Help needed climbing 3-5 steps with a railing? : A Little 6 Click Score: 22    End of Session Equipment Utilized During Treatment: Gait belt Activity Tolerance: Patient tolerated treatment well Patient left: Other (comment);with nursing/sitter in room (Pt left in bathroom with sitter present at end of session) Nurse Communication: Mobility status PT Visit Diagnosis: Muscle weakness (generalized) (M62.81);Unsteadiness on feet (R26.81)     Time: 4098-1191 PT Time Calculation (min) (ACUTE ONLY): 23 min  Charges:  $Gait Training: 23-37 mins                     D. Scott Denette Hass PT, DPT 05/05/22, 2:36 PM

## 2022-05-05 NOTE — TOC Progression Note (Signed)
Transition of Care [facility]) - Progression Note    Patient Details  Name: Madeline Taylor MRN: [id number] Date of Birth: 05-11-1935  Transition of [facility]) CM/SW Contact  Marlowe Sax, RN Phone Number: 05/05/2022, 9:44 AM  Clinical Narrative:    Sent email to DSS for the patient's daughter to apply for special assistance medicaid   Expected Discharge Plan: Long Term Nursing Home Barriers to Discharge: No SNF bed  Expected Discharge Plan and Services       Living arrangements for the past 2 months: Single Family Home                 DME Arranged: Walker rolling DME Agency: [employer] Date DME Agency Contacted: 04/25/22 Time DME Agency Contacted: [phone] Representative spoke with at DME Agency: [name]             Social Determinants of Health (SDOH) Interventions SDOH Screenings   Food Insecurity: Patient Unable To Answer (04/23/2022)  Housing: Low Risk  (04/23/2022)  Transportation Needs: No Transportation Needs (04/23/2022)  Utilities: Not At Risk (04/23/2022)  Alcohol Screen: Low Risk  (05/21/2021)  Depression (PHQ2-9): Low Risk  (05/21/2021)  Financial Resource Strain: Low Risk  (05/21/2021)  Physical Activity: Inactive (05/21/2021)  Social Connections: Moderately Integrated (05/21/2021)  Stress: No Stress Concern Present (05/21/2021)  Tobacco Use: Low Risk  (04/23/2022)    Readmission Risk Interventions     No data to display

## 2022-05-05 NOTE — TOC Progression Note (Signed)
Transition of Care [facility]) - Progression Note    Patient Details  Name: Madeline Taylor MRN: [id number] Date of Birth: 01-21-1935  [facility]) CM/SW Contact  Marlowe Sax, RN Phone Number: 05/05/2022, 10:31 AM  Clinical Narrative:     Uploaded the clinical information to PASSR  Expected Discharge Plan: Long Term Nursing Home Barriers to Discharge: No SNF bed  Expected Discharge Plan and Services       Living arrangements for the past 2 months: Single Family Home                 DME Arranged: Walker rolling DME Agency: [employer] Date DME Agency Contacted: 04/25/22 Time DME Agency Contacted: [phone] Representative spoke with at DME Agency: [name]             Social Determinants of Health (SDOH) Interventions SDOH Screenings   Food Insecurity: Patient Unable To Answer (04/23/2022)  Housing: Low Risk  (04/23/2022)  Transportation Needs: No Transportation Needs (04/23/2022)  Utilities: Not At Risk (04/23/2022)  Alcohol Screen: Low Risk  (05/21/2021)  Depression (PHQ2-9): Low Risk  (05/21/2021)  Financial Resource Strain: Low Risk  (05/21/2021)  Physical Activity: Inactive (05/21/2021)  Social Connections: Moderately Integrated (05/21/2021)  Stress: No Stress Concern Present (05/21/2021)  Tobacco Use: Low Risk  (04/23/2022)    Readmission Risk Interventions     No data to display

## 2022-05-05 NOTE — Progress Notes (Signed)
Mobility Specialist - Progress Note   05/05/22 0934  Mobility  Activity Ambulated independently in hallway  Level of Assistance Modified independent, requires aide device or extra time  Assistive Device None  Distance Ambulated (ft) 320 ft  Activity Response Tolerated well  $Mobility charge 1 Mobility   Pt standing at doorway upon entry, utilizing RA. Pt amb two laps around NS before expressing feeling "a little tired" and returning to the room. Pt left supine with needs within reach and NT present at bedside.   Zetta Bills Mobility Specialist 05/05/22 9:37 AM

## 2022-05-05 NOTE — Progress Notes (Signed)
Mobility Specialist - Progress Note   05/05/22 1303  Mobility  Activity Ambulated independently in hallway  Level of Assistance Independent  Assistive Device None  Distance Ambulated (ft) 1000 ft  Activity Response Tolerated well  Mobility Referral Yes  $Mobility charge 1 Mobility   Pt OOB on RA upon arrival. Pt ambulates in hallway indep with no LOB noted. Pt returns to room with needs in reach and sitter present.   Terrilyn Saver  Mobility Specialist  05/05/22 1:04 PM

## 2022-05-05 NOTE — Plan of Care (Signed)
  Problem: Clinical Measurements: Goal: Will remain free from infection 05/05/2022 1142 by Leonie Douglas, RN Outcome: Progressing 05/05/2022 1140 by Leonie Douglas, RN Outcome: Not Progressing Goal: Diagnostic test results will improve 05/05/2022 1142 by Leonie Douglas, RN Outcome: Progressing 05/05/2022 1140 by Leonie Douglas, RN Outcome: Not Met (add Reason) Goal: Respiratory complications will improve 05/05/2022 1142 by Leonie Douglas, RN Outcome: Progressing 05/05/2022 1140 by Leonie Douglas, RN Outcome: Not Met (add Reason) Goal: Cardiovascular complication will be avoided 05/05/2022 1142 by Leonie Douglas, RN Outcome: Progressing 05/05/2022 1140 by Leonie Douglas, RN Outcome: Not Met (add Reason)   Problem: Activity: Goal: Risk for activity intolerance will decrease Outcome: Progressing   Problem: Nutrition: Goal: Adequate nutrition will be maintained 05/05/2022 1142 by Leonie Douglas, RN Outcome: Progressing 05/05/2022 1140 by Leonie Douglas, RN Outcome: Progressing   Problem: Coping: Goal: Level of anxiety will decrease 05/05/2022 1142 by Leonie Douglas, RN Outcome: Progressing 05/05/2022 1140 by Leonie Douglas, RN Outcome: Progressing   Problem: Elimination: Goal: Will not experience complications related to bowel motility 05/05/2022 1142 by Leonie Douglas, RN Outcome: Progressing 05/05/2022 1140 by Leonie Douglas, RN Outcome: Progressing Goal: Will not experience complications related to urinary retention 05/05/2022 1142 by Leonie Douglas, RN Outcome: Progressing 05/05/2022 1140 by Leonie Douglas, RN Outcome: Not Met (add Reason)   Problem: Pain Managment: Goal: General experience of comfort will improve 05/05/2022 1142 by Leonie Douglas, RN Outcome: Progressing 05/05/2022 1140 by Leonie Douglas, RN Outcome: Not Met (add Reason)   Problem: Safety: Goal: Ability to remain free from injury will  improve 05/05/2022 1142 by Leonie Douglas, RN Outcome: Progressing 05/05/2022 1140 by Leonie Douglas, RN Outcome: Not Met (add Reason)   Problem: Skin Integrity: Goal: Risk for impaired skin integrity will decrease 05/05/2022 1142 by Leonie Douglas, RN Outcome: Progressing 05/05/2022 1140 by Leonie Douglas, RN Outcome: Not Met (add Reason)

## 2022-05-06 DIAGNOSIS — F03918 Unspecified dementia, unspecified severity, with other behavioral disturbance: Secondary | ICD-10-CM | POA: Diagnosis not present

## 2022-05-06 DIAGNOSIS — I10 Essential (primary) hypertension: Secondary | ICD-10-CM | POA: Diagnosis not present

## 2022-05-06 NOTE — Progress Notes (Signed)
Physical Therapy Treatment Patient Details Name: Madeline Taylor MRN: [id number] DOB: Jun 17, 1935 Today's Date: 05/06/2022   History of Present Illness Pt is an 87 y/o F admitted on 04/22/22 after presenting with encephalopathy & hypothermia. Pt had COVID about a week ago with worsening behavior since then, as well as sleep deprivation. PMH: dementia, HTN, TIAs    PT Comments    Pt was sitting EOB with sitter present upon arrival. She is alert and but continues to be disoriented. Pleasantly confused this morning overall. She was easily able to stand and ambulate without AD however does have unsteadiness with higher level dynamic balance exercises. Author had pt perform ambulation outside on uneven surfaces. Session also focused on improving her safety with stair performance without UE support. Pt is unsteady with stair training. Acute PT will continue to focus on improving balance and safety with stairs while in acute hospital.     Recommendations for follow up therapy are one component of a multi-disciplinary discharge planning process, led by the attending physician.  Recommendations may be updated based on patient status, additional functional criteria and insurance authorization.     Assistance Recommended at Discharge Frequent or constant Supervision/Assistance  Patient can return home with the following A little help with walking and/or transfers;A little help with bathing/dressing/bathroom;Assistance with cooking/housework;Assist for transportation;Help with stairs or ramp for entrance;Direct supervision/assist for medications management;Direct supervision/assist for financial management   Equipment Recommendations  None recommended by PT       Precautions / Restrictions Precautions Precautions: Fall Restrictions Weight Bearing Restrictions: No     Mobility  Bed Mobility Overal bed mobility: Modified Independent   Transfers Overall transfer level: Modified independent Equipment  used: None Transfers: Sit to/from Stand Sit to Stand: Supervision   Ambulation/Gait Ambulation/Gait assistance: Supervision Gait Distance (Feet): 1000 Feet Assistive device: None Gait Pattern/deviations: WFL(Within Functional Limits) Gait velocity: decreased  General Gait Details: pt was able to ambulate >100 ft inside/outside on level and unlevel surfaces. pt does have occasiona unsteadiness however no intervention required to prevent falling   Stairs Stairs: Yes Stairs assistance: Min assist Stair Management: Alternating pattern, Step to pattern, Forwards, No rails Number of Stairs: 4 General stair comments: pt was able to perform ascending/ descending stairs 4 x without AD. pt remains unsteady with occasional min assist to prevent falling. will need continued practice to improve safty and balance    Balance Overall balance assessment: Needs assistance Sitting-balance support: Feet supported Sitting balance-Leahy Scale: Normal     Standing balance support: No upper extremity supported Standing balance-Leahy Scale: Good      High Level Balance Comments: Pt was able to perform and tolerate higher level balance exercises however does require intervention several times to prevent fall. Acute OPT will continue to focus on improving dynamic balance and safty with stair performance.       Cognition Arousal/Alertness: Awake/alert Behavior During Therapy: WFL for tasks assessed/performed Overall Cognitive Status: History of cognitive impairments - at baseline        General Comments: Pt is alert however disoriented x 3               Pertinent Vitals/Pain Pain Assessment Pain Assessment: 0-10 Pain Score: 2  Pain Location: hip Pain Descriptors / Indicators: Discomfort Pain Intervention(s): Limited activity within patient's tolerance, Monitored during session, Premedicated before session, Repositioned     PT Goals (current goals can now be found in the care plan  section) Acute Rehab PT Goals Patient Stated Goal: none stated  Progress towards PT goals: Progressing toward goals    Frequency    Min 2X/week      PT Plan Current plan remains appropriate    Co-evaluation     PT goals addressed during session: Mobility/safety with mobility;Balance;Proper use of DME;Strengthening/ROM        AM-PAC PT "6 Clicks" Mobility   Outcome Measure  Help needed turning from your back to your side while in a flat bed without using bedrails?: None Help needed moving from lying on your back to sitting on the side of a flat bed without using bedrails?: None Help needed moving to and from a bed to a chair (including a wheelchair)?: None Help needed standing up from a chair using your arms (e.g., wheelchair or bedside chair)?: None Help needed to walk in hospital room?: A Little Help needed climbing 3-5 steps with a railing? : A Little 6 Click Score: 22    End of Session   Activity Tolerance: Patient tolerated treatment well Patient left: in chair;with call bell/phone within reach;with nursing/sitter in room Nurse Communication: Mobility status PT Visit Diagnosis: Muscle weakness (generalized) (M62.81);Unsteadiness on feet (R26.81)     Time: 1610-9604 PT Time Calculation (min) (ACUTE ONLY): 30 min  Charges:  $Gait Training: 8-22 mins $Neuromuscular Re-education: 8-22 mins                    Jetta Lout PTA 05/06/22, 9:28 AM

## 2022-05-06 NOTE — Progress Notes (Signed)
Mobility Specialist - Progress Note   05/06/22 0947  Mobility  Activity Ambulated with assistance in hallway  Level of Assistance Standby assist, set-up cues, supervision of patient - no hands on  Assistive Device None  Distance Ambulated (ft) 1000 ft  Activity Response Tolerated well  Mobility Referral Yes  $Mobility charge 1 Mobility   Pt OOB on RA upon arrival. Pt ambulates outside to main entrance and back Supervision. Pt  climbs up/down 4 stairs SBA with no LOB noted but some general unsteadiness going up. Pt left in room with needs in reach and Sitter present.  Terrilyn Saver  Mobility Specialist  05/06/22 9:50 AM

## 2022-05-06 NOTE — Progress Notes (Signed)
  Progress Note   Patient: Madeline Taylor [id number]:[id number] DOB: 13-Jul-1935 DOA: 04/22/2022     13 DOS: the patient was seen and examined on 05/06/2022   Brief hospital course:  Madeline Taylor is a 87 y.o. female with medical history significant of dementia, hypertension, history of TIAs presenting with encephalopathy, hypothermia  Per daughter, patient had worsening dementia, frequently Lost walking out of the house.  But normally has good appetite.  For the last week, patient had significant increased agitation, has not been sleeping well. She also had a positive COVID about a week ago, repeat COVID was negative at admission. Patient is seen by PT/OT, no need for nursing home placement.  However unsafe discharge.  The patient has vascular dementia and does not have the ability to make medical decisions or sign paperwork for herself at this time.     Principal Problem:   Vascular dementia Active Problems:   Essential hypertension   History of TIA (transient ischemic attack)   Hypothermia   Osteoarthritis of multiple joints   Metabolic acidosis   Dementia with behavioral disturbance   Assessment and Plan:  Vascular dementia with behavioral disturbance. Dementia with behavioral disturbance.  On Seroquel at night.  Risperdal as needed during the day. No new issue, pending placement.   Essential hypertension On amlodipine and losartan     History of TIA (transient ischemic attack) MRI brain was negative for acute intracranial abnormality.   Hypothermia Resolved.  No signs of infection   Metabolic acidosis Improved      Subjective:  Doing well, no new issues.  Physical Exam: Vitals:   05/05/22 0742 05/05/22 1723 05/06/22 0902 05/06/22 0906  BP: (!) 152/81 134/70 137/68 137/68  Pulse: 65 72  77  Resp: Temp: 97.7 F (36.5 C) 98.3 F (36.8 C)  98 F (36.7 C)  TempSrc:      SpO2: 100% 99%  100%  Weight:      Height:       General exam: Appears calm and  comfortable  Respiratory system: Clear to auscultation. Respiratory effort normal. Cardiovascular system: S1 & S2 heard, RRR. No JVD, murmurs, rubs, gallops or clicks. No pedal edema. Gastrointestinal system: Abdomen is nondistended, soft and nontender. No organomegaly or masses felt. Normal bowel sounds heard. Central nervous system: Alert and oriented x2. No focal neurological deficits. Extremities: Symmetric 5 x 5 power. Skin: No rashes, lesions or ulcers Psychiatry: Judgement and insight appear normal. Mood & affect appropriate.    Data Reviewed:  There are no new results to review at this time.  Family Communication: None  Disposition: Status is: Inpatient Remains inpatient appropriate because: Unsafe discharge.     Time spent: 25 minutes  Author: Marrion Coy, MD 05/06/2022 11:51 AM  For on call review [email or web address].

## 2022-05-07 DIAGNOSIS — I10 Essential (primary) hypertension: Secondary | ICD-10-CM | POA: Diagnosis not present

## 2022-05-07 DIAGNOSIS — F03918 Unspecified dementia, unspecified severity, with other behavioral disturbance: Secondary | ICD-10-CM | POA: Diagnosis not present

## 2022-05-07 MED ORDER — SENNOSIDES-DOCUSATE SODIUM 8.6-50 MG PO TABS
2.0000 | ORAL_TABLET | Freq: Two times a day (BID) | ORAL | Status: DC
  Administered 2022-05-07 – 2022-07-19 (×129): 2 via ORAL
  Filled 2022-05-07 (×137): qty 2

## 2022-05-07 NOTE — Progress Notes (Signed)
Physical Therapy Treatment Patient Details Name: Madeline Taylor MRN: [id number] DOB: 16-Nov-1935 Today's Date: 05/07/2022   History of Present Illness Pt is an 87 y/o F admitted on 04/22/22 after presenting with encephalopathy & hypothermia. Pt had COVID about a week ago with worsening behavior since then, as well as sleep deprivation. PMH: dementia, HTN, TIAs    PT Comments    Pt was pleasant and motivated to participate during the session and put forth good effort throughout. Pt steady with amb without an AD including during start/stops and with head turns.  Pt continued to present with min instability during stair training without the use of rails requiring occasional min A to prevent LOB.  Pt was able to perform alternating toe taps 2 x 10 and static standing balance training per below with 1-2 instances of min instability that the pt was able to self-correct.  Pt will benefit from continued PT services while in acute care and 24/7 supervision upon discharge.     Recommendations for follow up therapy are one component of a multi-disciplinary discharge planning process, led by the attending physician.  Recommendations may be updated based on patient status, additional functional criteria and insurance authorization.  Follow Up Recommendations  Can patient physically be transported by private vehicle: Yes    Assistance Recommended at Discharge Frequent or constant Supervision/Assistance  Patient can return home with the following A little help with walking and/or transfers;A little help with bathing/dressing/bathroom;Assistance with cooking/housework;Assist for transportation;Help with stairs or ramp for entrance;Direct supervision/assist for medications management;Direct supervision/assist for financial management   Equipment Recommendations  None recommended by PT    Recommendations for Other Services       Precautions / Restrictions Precautions Precautions: Fall Restrictions Weight  Bearing Restrictions: No     Mobility  Bed Mobility               General bed mobility comments: NT, pt found sitting at EOB    Transfers Overall transfer level: Modified independent Equipment used: None Transfers: Sit to/from Stand             General transfer comment: Good eccentric and concentric control and stability from multiple height surfaces    Ambulation/Gait Ambulation/Gait assistance: Supervision Gait Distance (Feet): 600 Feet Assistive device: None Gait Pattern/deviations: WFL(Within Functional Limits) Gait velocity: decreased     General Gait Details: Generally steady during ambulation with no overt LOB including during start/stops and head turns   Stairs Stairs: Yes Stairs assistance: Min assist Stair Management: Alternating pattern, Step to pattern, Forwards, No rails Number of Stairs: 8 General stair comments: Pt able to ascend/descend 4 stairs x 2 without AD with occasional min assist for stability   Wheelchair Mobility    Modified Rankin (Stroke Patients Only)       Balance Overall balance assessment: Needs assistance   Sitting balance-Leahy Scale: Normal     Standing balance support: No upper extremity supported Standing balance-Leahy Scale: Good   Single Leg Stance - Right Leg: 8 Single Leg Stance - Left Leg: 5                        Cognition Arousal/Alertness: Awake/alert Behavior During Therapy: WFL for tasks assessed/performed Overall Cognitive Status: History of cognitive impairments - at baseline  Exercises Other Exercises Other Exercises: Alternating toe taps 2 x 10 Other Exercises: Static standing balance training wtih feet together and semi-tandem with combinations of eyes open/closed and head still/head turns    General Comments        Pertinent Vitals/Pain Pain Assessment Pain Assessment: No/denies pain    Home Living                           Prior Function            PT Goals (current goals can now be found in the care plan section) Progress towards PT goals: Progressing toward goals    Frequency    Min 2X/week      PT Plan Current plan remains appropriate    Co-evaluation              AM-PAC PT "6 Clicks" Mobility   Outcome Measure  Help needed turning from your back to your side while in a flat bed without using bedrails?: None Help needed moving from lying on your back to sitting on the side of a flat bed without using bedrails?: None Help needed moving to and from a bed to a chair (including a wheelchair)?: None Help needed standing up from a chair using your arms (e.g., wheelchair or bedside chair)?: None Help needed to walk in hospital room?: A Little Help needed climbing 3-5 steps with a railing? : A Little 6 Click Score: 22    End of Session Equipment Utilized During Treatment: Gait belt Activity Tolerance: Patient tolerated treatment well Patient left: in chair;with call bell/phone within reach;with nursing/sitter in room Nurse Communication: Mobility status PT Visit Diagnosis: Muscle weakness (generalized) (M62.81);Unsteadiness on feet (R26.81)     Time: 1130-1146 PT Time Calculation (min) (ACUTE ONLY): 16 min  Charges:  $Gait Training: 8-22 mins                     D. Scott Dafina Suk PT, DPT 05/07/22, 12:09 PM

## 2022-05-07 NOTE — Progress Notes (Signed)
Mobility Specialist - Progress Note   05/07/22 1504  Mobility  Activity Ambulated independently in room;Dangled on edge of bed  Level of Assistance Independent  Assistive Device None  Distance Ambulated (ft) 4 ft  Activity Response Tolerated well  $Mobility charge 1 Mobility   Pt sitting EOB upon entry, utilizing RA. Pt denied amb in the hallway this date, stating her "hip is causing some pain" and that she is "feeling lazy today". Pt agreeable to bed level exercises. Pt stood from EOB indep, amb around the bed and returned seated on the EOB. Pt completed 2x4 BLE leg raises and pillow squeezes, and 6 ankle circles and pumps. Pt left seated EOB with needs within reach and sitter present at bedside.   Zetta Bills Mobility Specialist 05/07/22 3:08 PM

## 2022-05-07 NOTE — Plan of Care (Signed)

## 2022-05-07 NOTE — Progress Notes (Signed)
  Progress Note   Patient: Madeline Taylor UJW:[id number] DOB: 08-Dec-1935 DOA: 04/22/2022     14 DOS: the patient was seen and examined on 05/07/2022   Brief hospital course:  Madeline Taylor is a 87 y.o. female with medical history significant of dementia, hypertension, history of TIAs presenting with encephalopathy, hypothermia  Per daughter, patient had worsening dementia, frequently Lost walking out of the house.  But normally has good appetite.  For the last week, patient had significant increased agitation, has not been sleeping well. She also had a positive COVID about a week ago, repeat COVID was negative at admission. Patient is seen by PT/OT, no need for nursing home placement.  However unsafe discharge.  The patient has vascular dementia and does not have the ability to make medical decisions or sign paperwork for herself at this time.     Principal Problem:   Vascular dementia Active Problems:   Essential hypertension   History of TIA (transient ischemic attack)   Hypothermia   Osteoarthritis of multiple joints   Metabolic acidosis   Dementia with behavioral disturbance   Assessment and Plan: Vascular dementia with behavioral disturbance. Dementia with behavioral disturbance.  On Seroquel at night.  Risperdal as needed during the day. No new issue, pending placement.   Essential hypertension On amlodipine and losartan     History of TIA (transient ischemic attack) MRI brain was negative for acute intracranial abnormality.   Hypothermia Resolved.  No signs of infection   Metabolic acidosis Improved      Subjective:  Patient has no complaint.  Physical Exam: Vitals:   05/06/22 2300 05/06/22 2357 05/07/22 0745 05/07/22 1151  BP: (!) 158/68 (!) 158/68 (!) 129/107 132/70  Pulse: 77 77 70 76  Resp: Temp: 98 F (36.7 C) 98 F (36.7 C) 98.2 F (36.8 C) 97.9 F (36.6 C)  TempSrc: Oral  Oral Oral  SpO2: 100% 100% 100% 99%  Weight:      Height:        General exam: Appears calm and comfortable  Respiratory system: Clear to auscultation. Respiratory effort normal. Cardiovascular system: S1 & S2 heard, RRR. No JVD, murmurs, rubs, gallops or clicks. No pedal edema. Gastrointestinal system: Abdomen is nondistended, soft and nontender. No organomegaly or masses felt. Normal bowel sounds heard. Central nervous system: Alert and oriented x2. No focal neurological deficits. Extremities: Symmetric 5 x 5 power. Skin: No rashes, lesions or ulcers Psychiatry: Judgement and insight appear normal. Mood & affect appropriate.    Data Reviewed:  There are no new results to review at this time.  Family Communication: None  Disposition: Status is: Inpatient Remains inpatient appropriate because: Unsafe discharge.     Time spent: 25 minutes  Author: Marrion Coy, MD 05/07/2022 1:42 PM  For on call review [email or web address].

## 2022-05-08 DIAGNOSIS — F03918 Unspecified dementia, unspecified severity, with other behavioral disturbance: Secondary | ICD-10-CM | POA: Diagnosis not present

## 2022-05-08 DIAGNOSIS — I10 Essential (primary) hypertension: Secondary | ICD-10-CM | POA: Diagnosis not present

## 2022-05-08 NOTE — Progress Notes (Signed)
Mobility Specialist - Progress Note   05/08/22 0819  Mobility  Activity Ambulated independently in hallway;Stood at bedside;Dangled on edge of bed  Level of Assistance Independent  Assistive Device None  Distance Ambulated (ft) 640 ft  Activity Response Tolerated well  Mobility Referral Yes  $Mobility charge 1 Mobility   Pt sitting EOB on RA upon arrival. Pt STS and ambulates 4 laps around NS Indep with no LOB noted. Pt returns to room with NT present.   Terrilyn Saver  Mobility Specialist  05/08/22 8:20 AM

## 2022-05-08 NOTE — Progress Notes (Signed)
  Progress Note   Patient: Madeline Taylor ZOX:[id number] DOB: 01/07/1936 DOA: 04/22/2022     15 DOS: the patient was seen and examined on 05/08/2022   Brief hospital course:  ITZELLE GAINS is a 87 y.o. female with medical history significant of dementia, hypertension, history of TIAs presenting with encephalopathy, hypothermia  Per daughter, patient had worsening dementia, frequently Lost walking out of the house.  But normally has good appetite.  For the last week, patient had significant increased agitation, has not been sleeping well. She also had a positive COVID about a week ago, repeat COVID was negative at admission. Patient is seen by PT/OT, no need for nursing home placement.  However unsafe discharge.  The patient has vascular dementia and does not have the ability to make medical decisions or sign paperwork for herself at this time.     Principal Problem:   Vascular dementia Active Problems:   Essential hypertension   History of TIA (transient ischemic attack)   Hypothermia   Osteoarthritis of multiple joints   Metabolic acidosis   Dementia with behavioral disturbance   Assessment and Plan:  Vascular dementia with behavioral disturbance. Dementia with behavioral disturbance.  On Seroquel at night.  Risperdal as needed during the day. Patient doing well, currently looking for memory unit.  Unsafe discharge.   Essential hypertension On amlodipine and losartan     History of TIA (transient ischemic attack) MRI brain was negative for acute intracranial abnormality.   Hypothermia Resolved.  No signs of infection   Metabolic acidosis Improved       Subjective: No complaint  Physical Exam: Vitals:   05/07/22 1455 05/07/22 1549 05/07/22 2352 05/08/22 0910  BP: 110/86 136/66 (!) 151/58 (!) 153/77  Pulse: 74 72 68 91  Resp: Temp: 98.3 F (36.8 C) 99.5 F (37.5 C) 98.2 F (36.8 C) 98.7 F (37.1 C)  TempSrc: Oral   Oral  SpO2: 100% 100% 98% 98%   Weight:      Height:       General exam: Appears calm and comfortable  Respiratory system: Clear to auscultation. Respiratory effort normal. Cardiovascular system: S1 & S2 heard, RRR. No JVD, murmurs, rubs, gallops or clicks. No pedal edema. Gastrointestinal system: Abdomen is nondistended, soft and nontender. No organomegaly or masses felt. Normal bowel sounds heard. Central nervous system: Alert and oriented x2. No focal neurological deficits. Extremities: Symmetric 5 x 5 power. Skin: No rashes, lesions or ulcers Psychiatry: Judgement and insight appear normal. Mood & affect appropriate.    Data Reviewed:  There are no new results to review at this time.  Family Communication: None  Disposition: Status is: Inpatient Remains inpatient appropriate because: Unsafe discharge     Time spent: 25 minutes  Author: Marrion Coy, MD 05/08/2022 12:43 PM  For on call review [email or web address].

## 2022-05-09 DIAGNOSIS — Z8673 Personal history of transient ischemic attack (TIA), and cerebral infarction without residual deficits: Secondary | ICD-10-CM | POA: Diagnosis not present

## 2022-05-09 DIAGNOSIS — I10 Essential (primary) hypertension: Secondary | ICD-10-CM | POA: Diagnosis not present

## 2022-05-09 DIAGNOSIS — F03918 Unspecified dementia, unspecified severity, with other behavioral disturbance: Secondary | ICD-10-CM | POA: Diagnosis not present

## 2022-05-09 NOTE — Progress Notes (Signed)
Mobility Specialist - Progress Note   05/09/22 0944  Mobility  Activity Ambulated with assistance in hallway;Stood at bedside;Dangled on edge of bed  Level of Assistance Standby assist, set-up cues, supervision of patient - no hands on  Assistive Device None  Distance Ambulated (ft) 480 ft  Activity Response Tolerated well  Mobility Referral Yes  $Mobility charge 1 Mobility   Pt supine in bed on RA upon arrival. Pt completes bed mobility and dons shoes indep. Pt ambulates 3 laps Supervision with no LOB but some noted unsteadiness. Pt returns to bed with needs in reach and sitter present.   Terrilyn Saver  Mobility Specialist  05/09/22 9:45 AM

## 2022-05-09 NOTE — Plan of Care (Signed)

## 2022-05-09 NOTE — Progress Notes (Signed)
  Progress Note   Patient: Madeline Taylor ZOX:[id number] DOB: Mar 12, 1935 DOA: 04/22/2022     16 DOS: the patient was seen and examined on 05/09/2022   Brief hospital course:  Madeline Taylor is a 87 y.o. female with medical history significant of dementia, hypertension, history of TIAs presenting with encephalopathy, hypothermia  Per daughter, patient had worsening dementia, frequently Lost walking out of the house.  But normally has good appetite.  For the last week, patient had significant increased agitation, has not been sleeping well. She also had a positive COVID about a week ago, repeat COVID was negative at admission. Patient is seen by PT/OT, no need for nursing home placement.  However unsafe discharge.  The patient has vascular dementia and does not have the ability to make medical decisions or sign paperwork for herself at this time.     Principal Problem:   Vascular dementia Active Problems:   Essential hypertension   History of TIA (transient ischemic attack)   Hypothermia   Osteoarthritis of multiple joints   Metabolic acidosis   Dementia with behavioral disturbance   Assessment and Plan: Vascular dementia with behavioral disturbance. Dementia with behavioral disturbance.  On Seroquel at night.  Risperdal as needed during the day. Patient slept well, appetite improved.  No new issues, pending placement into memory unit.   Essential hypertension On amlodipine and losartan     History of TIA (transient ischemic attack) MRI brain was negative for acute intracranial abnormality.   Hypothermia Resolved.  No signs of infection   Metabolic acidosis Improved      Subjective: No complaint.  Physical Exam: Vitals:   05/08/22 0910 05/08/22 1641 05/08/22 2131 05/09/22 0711  BP: (!) 153/77 (!) 157/81 128/65 130/74  Pulse: 91 79 86 78  Resp: Temp: 98.7 F (37.1 C) 97.9 F (36.6 C) 98.3 F (36.8 C) (!) 97.2 F (36.2 C)  TempSrc: Oral     SpO2: 98%  100% 95% 99%  Weight:      Height:       General exam: Appears calm and comfortable  Respiratory system: Clear to auscultation. Respiratory effort normal. Cardiovascular system: S1 & S2 heard, RRR. No JVD, murmurs, rubs, gallops or clicks. No pedal edema. Gastrointestinal system: Abdomen is nondistended, soft and nontender. No organomegaly or masses felt. Normal bowel sounds heard. Central nervous system: Alert and oriented x2. No focal neurological deficits. Extremities: Symmetric 5 x 5 power. Skin: No rashes, lesions or ulcers Psychiatry: Judgement and insight appear normal. Mood & affect appropriate.    Data Reviewed:  There are no new results to review at this time.  Family Communication: None  Disposition: Status is: Inpatient Remains inpatient appropriate because: Unsafe discharge.     Time spent: 25 minutes  Author: Marrion Coy, MD 05/09/2022 10:16 AM  For on call review [email or web address].

## 2022-05-10 ENCOUNTER — Telehealth: Payer: Self-pay | Admitting: Internal Medicine

## 2022-05-10 DIAGNOSIS — I10 Essential (primary) hypertension: Secondary | ICD-10-CM | POA: Diagnosis not present

## 2022-05-10 DIAGNOSIS — F03918 Unspecified dementia, unspecified severity, with other behavioral disturbance: Secondary | ICD-10-CM | POA: Diagnosis not present

## 2022-05-10 NOTE — Progress Notes (Signed)
Mobility Specialist - Progress Note   05/10/22 1350  Mobility  Activity Refused mobility   MS attempted twice to amb Pt this date. Pt denied due to "hip pain". Will try again at a later date/time.  Zetta Bills Mobility Specialist 05/10/22 1:51 PM

## 2022-05-10 NOTE — Plan of Care (Signed)
  Problem: Education: Goal: Knowledge of General Education information will improve Description: Including pain rating scale, medication(s)/side effects and non-pharmacologic comfort measures Outcome: Progressing   Problem: Activity: Goal: Risk for activity intolerance will decrease Outcome: Progressing   Problem: Nutrition: Goal: Adequate nutrition will be maintained Outcome: Progressing   Problem: Coping: Goal: Level of anxiety will decrease Outcome: Progressing   Problem: Elimination: Goal: Will not experience complications related to bowel motility Outcome: Progressing   Problem: Safety: Goal: Ability to remain free from injury will improve Outcome: Progressing   Problem: Skin Integrity: Goal: Risk for impaired skin integrity will decrease Outcome: Progressing   Problem: Pain Managment: Goal: General experience of comfort will improve Outcome: Progressing

## 2022-05-10 NOTE — Progress Notes (Signed)
Physical Therapy Treatment Patient Details Name: Madeline Taylor MRN: [id number] DOB: [birth year] Today's Date: 05/10/2022   History of Present Illness Pt is an 87 y/o F admitted on 04/22/22 after presenting with encephalopathy & hypothermia. Pt had COVID about a week ago with worsening behavior since then, as well as sleep deprivation. PMH: dementia, HTN, TIAs    PT Comments    Pt was pleasant and motivated to participate during the session and put forth good effort throughout. Pt generally steady with gait during dynamic gait training and required grossly decreased assist during stair training.  Pt continued to present with min instability/swaying while ascending and descending stairs but was able to self-correct this session without assist.  Pt required occasional min A for stability during below balance training during higher intensity activities most notably during tandem and SLS training.  No skilled PT services recommended at discharge with 24/7 supervision recommended secondary to cognition.      Recommendations for follow up therapy are one component of a multi-disciplinary discharge planning process, led by the attending physician.  Recommendations may be updated based on patient status, additional functional criteria and insurance authorization.  Follow Up Recommendations  Can patient physically be transported by private vehicle: Yes    Assistance Recommended at Discharge Frequent or constant Supervision/Assistance  Patient can return home with the following A little help with walking and/or transfers;A little help with bathing/dressing/bathroom;Assistance with cooking/housework;Assist for transportation;Help with stairs or ramp for entrance;Direct supervision/assist for medications management;Direct supervision/assist for financial management   Equipment Recommendations  None recommended by PT    Recommendations for Other Services       Precautions / Restrictions  Precautions Precautions: Fall Restrictions Weight Bearing Restrictions: No     Mobility  Bed Mobility Overal bed mobility: Independent                  Transfers Overall transfer level: Modified independent Equipment used: None Transfers: Sit to/from Stand Sit to Stand: Modified independent (Device/Increase time)           General transfer comment: Good eccentric and concentric control and stability from multiple height surfaces    Ambulation/Gait Ambulation/Gait assistance: Supervision Gait Distance (Feet): 600 Feet Assistive device: None Gait Pattern/deviations: WFL(Within Functional Limits) Gait velocity: decreased     General Gait Details: Generally steady during ambulation with no overt LOB including during start/stops and head turns   Stairs Stairs: Yes Stairs assistance: Min guard Stair Management: Alternating pattern, Step to pattern, Forwards, No rails Number of Stairs: 8 General stair comments: Pt able to ascend/descend 4 stairs x 2 without AD with varying step pattern and min instability but no physical assist needed to prevent LOB   Wheelchair Mobility    Modified Rankin (Stroke Patients Only)       Balance Overall balance assessment: Needs assistance Sitting-balance support: Feet supported Sitting balance-Leahy Scale: Normal     Standing balance support: No upper extremity supported Standing balance-Leahy Scale: Good                              Cognition Arousal/Alertness: Awake/alert Behavior During Therapy: WFL for tasks assessed/performed Overall Cognitive Status: History of cognitive impairments - at baseline                                          Exercises  Other Exercises Other Exercises: Static standing balance training with combinations of eyes open/closed and head still/head turns with feet together and semi-tandem; static standing balance training with eyes open and head still in  tandem Other Exercises: Dynamic standing balance training with reaching outside BOS with feet together and semi-tandem Other Exercises: SLS training with max standing time 4-6 sec on each LE with occasional min A to prevent LOB    General Comments        Pertinent Vitals/Pain Pain Assessment Pain Assessment: No/denies pain    Home Living                          Prior Function            PT Goals (current goals can now be found in the care plan section) Progress towards PT goals: Progressing toward goals    Frequency    Min 2X/week      PT Plan Current plan remains appropriate    Co-evaluation              AM-PAC PT "6 Clicks" Mobility   Outcome Measure  Help needed turning from your back to your side while in a flat bed without using bedrails?: None Help needed moving from lying on your back to sitting on the side of a flat bed without using bedrails?: None Help needed moving to and from a bed to a chair (including a wheelchair)?: None Help needed standing up from a chair using your arms (e.g., wheelchair or bedside chair)?: None Help needed to walk in hospital room?: A Little Help needed climbing 3-5 steps with a railing? : A Little 6 Click Score: 22    End of Session Equipment Utilized During Treatment: Gait belt Activity Tolerance: Patient tolerated treatment well Patient left: in chair;with call bell/phone within reach;with nursing/sitter in room Nurse Communication: Mobility status PT Visit Diagnosis: Muscle weakness (generalized) (M62.81);Unsteadiness on feet (R26.81)     Time: 1610-9604 PT Time Calculation (min) (ACUTE ONLY): 25 min  Charges:  $Gait Training: 8-22 mins $Therapeutic Exercise: 8-22 mins                     D. Scott Florabel Faulks PT, DPT 05/10/22, 3:12 PM

## 2022-05-10 NOTE — Progress Notes (Signed)
  Progress Note   Patient: Madeline Taylor ZOX:[id number] DOB: 1935/07/08 DOA: 04/22/2022     17 DOS: the patient was seen and examined on 05/10/2022   Brief hospital course:  Madeline Taylor is a 87 y.o. female with medical history significant of dementia, hypertension, history of TIAs presenting with encephalopathy, hypothermia  Per daughter, patient had worsening dementia, frequently Lost walking out of the house.  But normally has good appetite.  For the last week, patient had significant increased agitation, has not been sleeping well. She also had a positive COVID about a week ago, repeat COVID was negative at admission. Patient is seen by PT/OT, no need for nursing home placement.  However unsafe discharge.  The patient has vascular dementia and does not have the ability to make medical decisions or sign paperwork for herself at this time.     Principal Problem:   Vascular dementia Active Problems:   Essential hypertension   History of TIA (transient ischemic attack)   Hypothermia   Osteoarthritis of multiple joints   Metabolic acidosis   Dementia with behavioral disturbance   Assessment and Plan: Vascular dementia with behavioral disturbance. Dementia with behavioral disturbance.  On Seroquel at night.  Risperdal as needed during the day. Patient slept well, appetite improved.  No new issues, pending placement into memory unit.   Essential hypertension On amlodipine and losartan     History of TIA (transient ischemic attack) MRI brain was negative for acute intracranial abnormality.   Hypothermia Resolved.  No signs of infection   Metabolic acidosis Improved        Subjective:  Patient has no complaint.  Physical Exam: Vitals:   05/09/22 0711 05/09/22 1123 05/09/22 1604 05/10/22 0900  BP: 130/74 114/67 121/63 120/64  Pulse: 78 87 73 80  Resp: 16  16   Temp: (!) 97.2 F (36.2 C)  98.4 F (36.9 C)   TempSrc:      SpO2: 99%  99%   Weight:      Height:        General exam: Appears calm and comfortable  Respiratory system: Clear to auscultation. Respiratory effort normal. Cardiovascular system: S1 & S2 heard, RRR. No JVD, murmurs, rubs, gallops or clicks. No pedal edema. Gastrointestinal system: Abdomen is nondistended, soft and nontender. No organomegaly or masses felt. Normal bowel sounds heard. Central nervous system: Alert and oriented x2. No focal neurological deficits. Extremities: Symmetric 5 x 5 power. Skin: No rashes, lesions or ulcers Psychiatry: Judgement and insight appear normal. Mood & affect appropriate.    Data Reviewed:  There are no new results to review at this time.  Family Communication: none  Disposition: Status is: Inpatient Remains inpatient appropriate because: Unsafe discharge.     Time spent: 25 minutes  Author: Marrion Coy, MD 05/10/2022 12:13 PM  For on call review [email or web address].

## 2022-05-10 NOTE — Telephone Encounter (Signed)
Contacted Madeline Taylor to schedule their annual wellness visit. Call back at later date: Patient daughter stated she was in hospital  [name]; Care Guide Ambulatory Clinical Support [place] [facility] Direct Dial: [phone]

## 2022-05-11 DIAGNOSIS — M159 Polyosteoarthritis, unspecified: Secondary | ICD-10-CM | POA: Diagnosis not present

## 2022-05-11 DIAGNOSIS — I10 Essential (primary) hypertension: Secondary | ICD-10-CM | POA: Diagnosis not present

## 2022-05-11 DIAGNOSIS — F03918 Unspecified dementia, unspecified severity, with other behavioral disturbance: Secondary | ICD-10-CM | POA: Diagnosis not present

## 2022-05-11 NOTE — Plan of Care (Signed)
  Problem: Education: Goal: Knowledge of General Education information will improve Description: Including pain rating scale, medication(s)/side effects and non-pharmacologic comfort measures Outcome: Progressing   Problem: Health Behavior/Discharge Planning: Goal: Ability to manage health-related needs will improve Outcome: Progressing   Problem: Clinical Measurements: Goal: Ability to maintain clinical measurements within normal limits will improve Outcome: Progressing Goal: Will remain free from infection Outcome: Progressing   Problem: Safety: Goal: Ability to remain free from injury will improve Outcome: Progressing   

## 2022-05-11 NOTE — Progress Notes (Signed)
  Progress Note   Patient: Madeline Taylor ZOX:[id number] DOB: [birth year] DOA: 04/22/2022     18 DOS: the patient was seen and examined on 05/11/2022   Brief hospital course:  BLIMA JAIMES is a 87 y.o. female with medical history significant of dementia, hypertension, history of TIAs presenting with encephalopathy, hypothermia  Per daughter, patient had worsening dementia, frequently Lost walking out of the house.  But normally has good appetite.  For the last week, patient had significant increased agitation, has not been sleeping well. She also had a positive COVID about a week ago, repeat COVID was negative at admission. Patient is seen by PT/OT, no need for nursing home placement.  However unsafe discharge.  The patient has vascular dementia and does not have the ability to make medical decisions or sign paperwork for herself at this time.     Principal Problem:   Vascular dementia Active Problems:   Essential hypertension   History of TIA (transient ischemic attack)   Hypothermia   Osteoarthritis of multiple joints   Metabolic acidosis   Dementia with behavioral disturbance   Assessment and Plan: Vascular dementia with behavioral disturbance. Dementia with behavioral disturbance.  On Seroquel at night.  Risperdal as needed during the day. Patient slept well, appetite improved.  No new issues, pending placement into memory unit.   Essential hypertension On amlodipine and losartan     History of TIA (transient ischemic attack) MRI brain was negative for acute intracranial abnormality.   Hypothermia Resolved.  No signs of infection   Metabolic acidosis Improved  Patient condition has been stable over the last 5 days, no new issues.  Currently pending for memory unit placement.     Subjective: Patient has no complaints.  Physical Exam: Vitals:   05/10/22 0900 05/10/22 1429 05/10/22 2232 05/11/22 0733  BP: 120/64 126/63 129/62 127/71  Pulse: 80 77 68 70  Resp:  Temp:  97.7 F (36.5 C) 97.9 F (36.6 C) 98.1 F (36.7 C)  TempSrc:    Oral  SpO2:  100% 100% 98%  Weight:      Height:       General exam: Appears calm and comfortable  Respiratory system: Clear to auscultation. Respiratory effort normal. Cardiovascular system: S1 & S2 heard, RRR. No JVD, murmurs, rubs, gallops or clicks. No pedal edema. Gastrointestinal system: Abdomen is nondistended, soft and nontender. No organomegaly or masses felt. Normal bowel sounds heard. Central nervous system: Alert and oriented x2. No focal neurological deficits. Extremities: Symmetric 5 x 5 power. Skin: No rashes, lesions or ulcers Psychiatry: Judgement and insight appear normal. Mood & affect appropriate.    Data Reviewed:  There are no new results to review at this time.  Family Communication: None  Disposition: Status is: Inpatient Remains inpatient appropriate because: Unsafe discharge.     Time spent: 25 minutes  Author: Marrion Coy, MD 05/11/2022 12:10 PM  For on call review [email or web address].

## 2022-05-11 NOTE — Progress Notes (Signed)
Mobility Specialist - Progress Note   05/11/22 1034  Mobility  Activity Ambulated independently in hallway  Level of Assistance Independent after set-up  Assistive Device None  Distance Ambulated (ft) 320 ft  Activity Response Tolerated well  $Mobility charge 1 Mobility   Pt side lying upon entry, utilizing RA. Pt agreeable to OOB amb stating her " hips feel much much better". Pt completed bed mob, STS and amb two laps around the NS indep. Pt requested use of cane, saying she "previously used/had a cane". Pt returned to room, left supine with sitter present at bedside.   Zetta Bills Mobility Specialist 05/11/22 10:39 AM

## 2022-05-11 NOTE — Progress Notes (Signed)
Occupational Therapy Treatment Patient Details Name: Madeline Taylor MRN: [id number] DOB: 1935-12-14 Today's Date: 05/11/2022   History of present illness Pt is an 87 y/o F admitted on 04/22/22 after presenting with encephalopathy & hypothermia. Pt had COVID about a week ago with worsening behavior since then, as well as sleep deprivation. PMH: dementia, HTN, TIAs   OT comments  Patient seen for re-evaluation. Chart reviewed to date, sitter present. Both agreeable to OT.  Pt tolerated dual tasking with cognitive challenge during IADL task (folding laundry) fairly well this date. Pt intermittently pausing task to answer questions. Challenged STM by asking pt same question at end of task, however, unable to report back accurately. Attempted dual tasking during functional mobility around nurse's station, however, distracted by people in hallway requiring Min VC for redirection. Answered accurately on 1/3 trials. Pt left as received with all needs in reach. Reviewed goals and updated based on progress. Pt continues to benefit from skilled OT to maximize safety and independence.   Recommendations for follow up therapy are one component of a multi-disciplinary discharge planning process, led by the attending physician.  Recommendations may be updated based on patient status, additional functional criteria and insurance authorization.    Assistance Recommended at Discharge Frequent or constant Supervision/Assistance  Patient can return home with the following  A little help with walking and/or transfers;A little help with bathing/dressing/bathroom;Assistance with cooking/housework;Direct supervision/assist for medications management;Direct supervision/assist for financial management;Assist for transportation;Help with stairs or ramp for entrance   Equipment Recommendations  None recommended by OT    Recommendations for Other Services      Precautions / Restrictions Precautions Precautions:  Fall Restrictions Weight Bearing Restrictions: No       Mobility Bed Mobility     General bed mobility comments: NT, pt received siting EOB    Transfers Overall transfer level: Modified independent Equipment used: None Transfers: Sit to/from Stand             General transfer comment: STS from EOB     Balance Overall balance assessment: Needs assistance Sitting-balance support: Feet supported Sitting balance-Leahy Scale: Normal     Standing balance support: No upper extremity supported Standing balance-Leahy Scale: Good       ADL either performed or assessed with clinical judgement   ADL Overall ADL's : Needs assistance/impaired       Toilet Transfer: Modified Independent Toilet Transfer Details (indicate cue type and reason): simulated, no AD         Functional mobility during ADLs: Supervision/safety (pt completed 2 laps around nurse's station with no AD, ~168ft x2) General ADL Comments: Pt completing IADL task of folding laundry while standing at counter with supervision for safety. Min VC to initiate. Pt deferred grooming and toileting tasks this date stating "I already did that"    Extremity/Trunk Assessment Upper Extremity Assessment Upper Extremity Assessment: Overall WFL for tasks assessed   Lower Extremity Assessment Lower Extremity Assessment: Generalized weakness        Vision Patient Visual Report: No change from baseline     Perception     Praxis      Cognition Arousal/Alertness: Awake/alert Behavior During Therapy: WFL for tasks assessed/performed Overall Cognitive Status: History of cognitive impairments - at baseline       General Comments: Pt is A&O to self. Unable to divide attention, became distracted by people in hallway, Mod VC for redirection back to task.        Exercises  Shoulder Instructions       General Comments      Pertinent Vitals/ Pain       Pain Assessment Pain Assessment: No/denies  pain  Home Living       Prior Functioning/Environment              Frequency  Min 1X/week        Progress Toward Goals  OT Goals(current goals can now be found in the care plan section)  Progress towards OT goals: Progressing toward goals  Acute Rehab OT Goals Patient Stated Goal: get better OT Goal Formulation: With patient Time For Goal Achievement: 05/25/22 Potential to Achieve Goals: Good  Plan Discharge plan remains appropriate;Frequency remains appropriate    Co-evaluation                 AM-PAC OT "6 Clicks" Daily Activity     Outcome Measure   Help from another person eating meals?: None Help from another person taking care of personal grooming?: A Little Help from another person toileting, which includes using toliet, bedpan, or urinal?: A Little Help from another person bathing (including washing, rinsing, drying)?: A Little Help from another person to put on and taking off regular upper body clothing?: None Help from another person to put on and taking off regular lower body clothing?: A Little 6 Click Score: 20    End of Session    OT Visit Diagnosis: Unsteadiness on feet (R26.81)   Activity Tolerance Patient tolerated treatment well   Patient Left in bed;with call bell/phone within reach;with nursing/sitter in room   Nurse Communication Mobility status        Time: 4098-1191 OT Time Calculation (min): 10 min  Charges: OT General Charges $OT Visit: 1 Visit OT Treatments $Therapeutic Activity: 8-22 mins  [facility] Madison Hights MS, OTR/L ascom [phone]  05/11/22, 11:25 AM

## 2022-05-12 DIAGNOSIS — F01B18 Vascular dementia, moderate, with other behavioral disturbance: Secondary | ICD-10-CM | POA: Diagnosis not present

## 2022-05-12 DIAGNOSIS — Z8673 Personal history of transient ischemic attack (TIA), and cerebral infarction without residual deficits: Secondary | ICD-10-CM | POA: Diagnosis not present

## 2022-05-12 DIAGNOSIS — S0083XS Contusion of other part of head, sequela: Secondary | ICD-10-CM

## 2022-05-12 DIAGNOSIS — I10 Essential (primary) hypertension: Secondary | ICD-10-CM | POA: Diagnosis not present

## 2022-05-12 DIAGNOSIS — E872 Acidosis, unspecified: Secondary | ICD-10-CM | POA: Diagnosis not present

## 2022-05-12 DIAGNOSIS — T68XXXS Hypothermia, sequela: Secondary | ICD-10-CM | POA: Diagnosis not present

## 2022-05-12 NOTE — Plan of Care (Signed)

## 2022-05-12 NOTE — Progress Notes (Signed)
Physical Therapy Treatment Patient Details Name: Madeline Taylor MRN: [id number] DOB: May 14, 1935 Today's Date: 05/12/2022   History of Present Illness Pt is an 87 y/o F admitted on 04/22/22 after presenting with encephalopathy & hypothermia. Pt had COVID about a week ago with worsening behavior since then, as well as sleep deprivation. PMH: dementia, HTN, TIAs    PT Comments    Pt was pleasant and motivated to participate during the session and put forth good effort throughout. Pt was generally steady with amb without an AD including during dynamic gait training and amb on uneven terrain and slopes.  Pt was able to ascend/descend 4 stairs x 2 without AD with alternating step pattern throughout with no overt LOB or instability this session.  Pt presented with min instability during below balance training but was able to self correct without physical assist.  No skilled PT services recommended at discharge.      Recommendations for follow up therapy are one component of a multi-disciplinary discharge planning process, led by the attending physician.  Recommendations may be updated based on patient status, additional functional criteria and insurance authorization.  Follow Up Recommendations  Can patient physically be transported by private vehicle: Yes    Assistance Recommended at Discharge Frequent or constant Supervision/Assistance  Patient can return home with the following A little help with walking and/or transfers;A little help with bathing/dressing/bathroom;Assistance with cooking/housework;Assist for transportation;Help with stairs or ramp for entrance;Direct supervision/assist for medications management;Direct supervision/assist for financial management   Equipment Recommendations  None recommended by PT    Recommendations for Other Services       Precautions / Restrictions Precautions Precautions: Fall Restrictions Weight Bearing Restrictions: No     Mobility  Bed  Mobility Overal bed mobility: Independent                  Transfers Overall transfer level: Independent                 General transfer comment: Good speed and effort as well as eccentric and concentric control and stability    Ambulation/Gait Ambulation/Gait assistance: Supervision Gait Distance (Feet): 800 Feet Assistive device: None Gait Pattern/deviations: WFL(Within Functional Limits) Gait velocity: decreased     General Gait Details: Generally steady during ambulation with no overt LOB including during start/stops and head turns and with amb on unever terrain and up/down ramps   Stairs Stairs: Yes Stairs assistance: Min guard Stair Management: Alternating pattern, Forwards, No rails Number of Stairs: 8 General stair comments: Pt able to ascend/descend 4 stairs x 2 without AD with alternating step pattern throughout with no overt LOB or instability this session   Wheelchair Mobility    Modified Rankin (Stroke Patients Only)       Balance Overall balance assessment: Needs assistance Sitting-balance support: Feet supported Sitting balance-Leahy Scale: Normal     Standing balance support: No upper extremity supported Standing balance-Leahy Scale: Good                              Cognition Arousal/Alertness: Awake/alert Behavior During Therapy: WFL for tasks assessed/performed Overall Cognitive Status: History of cognitive impairments - at baseline                                          Exercises Other Exercises Other Exercises: Static standing balance  training with combinations of eyes open/closed and head still/head turns with feet together and semi-tandem Other Exercises: Dynamic standing balance training with reaching outside BOS with feet together and semi-tandem    General Comments        Pertinent Vitals/Pain Pain Assessment Pain Assessment: No/denies pain    Home Living                           Prior Function            PT Goals (current goals can now be found in the care plan section) Progress towards PT goals: Progressing toward goals    Frequency    Min 2X/week      PT Plan Current plan remains appropriate    Co-evaluation              AM-PAC PT "6 Clicks" Mobility   Outcome Measure  Help needed turning from your back to your side while in a flat bed without using bedrails?: None Help needed moving from lying on your back to sitting on the side of a flat bed without using bedrails?: None Help needed moving to and from a bed to a chair (including a wheelchair)?: None Help needed standing up from a chair using your arms (e.g., wheelchair or bedside chair)?: None Help needed to walk in hospital room?: A Little Help needed climbing 3-5 steps with a railing? : A Little 6 Click Score: 22    End of Session Equipment Utilized During Treatment: Gait belt Activity Tolerance: Patient tolerated treatment well Patient left: in bed;with call bell/phone within reach;with nursing/sitter in room Nurse Communication: Mobility status PT Visit Diagnosis: Muscle weakness (generalized) (M62.81);Unsteadiness on feet (R26.81)     Time: 1191-4782 PT Time Calculation (min) (ACUTE ONLY): 23 min  Charges:  $Gait Training: 8-22 mins $Therapeutic Exercise: 8-22 mins                    D. Scott Landyn Lorincz PT, DPT 05/12/22, 10:55 AM

## 2022-05-12 NOTE — Progress Notes (Signed)
Mobility Specialist - Progress Note   05/12/22 1100  Mobility  Activity Ambulated with assistance in hallway;Stood at bedside;Dangled on edge of bed  Level of Assistance Standby assist, set-up cues, supervision of patient - no hands on  Assistive Device None  Distance Ambulated (ft) 320 ft  Activity Response Tolerated well  Mobility Referral Yes  $Mobility charge 1 Mobility   Pt sitting EOB on RA upon arrival. Pt STS and ambulates in hallway 2 laps Supervision. Pt returns to room  with sitter present.   Terrilyn Saver  Mobility Specialist  05/12/22 11:01 AM

## 2022-05-12 NOTE — Assessment & Plan Note (Addendum)
improved

## 2022-05-12 NOTE — Plan of Care (Signed)
  Problem: Health Behavior/Discharge Planning: Goal: Ability to manage health-related needs will improve Outcome: Progressing   Problem: Clinical Measurements: Goal: Will remain free from infection Outcome: Progressing Goal: Cardiovascular complication will be avoided Outcome: Progressing   Problem: Nutrition: Goal: Adequate nutrition will be maintained Outcome: Progressing   Problem: Elimination: Goal: Will not experience complications related to bowel motility Outcome: Progressing Goal: Will not experience complications related to urinary retention Outcome: Progressing

## 2022-05-12 NOTE — Progress Notes (Signed)
Progress Note   Patient: Madeline Taylor [id number]:[id number] DOB: 02-18-1935 DOA: 04/22/2022     19 DOS: the patient was seen and examined on 05/12/2022   Brief hospital course: 87 y.o. female with medical history significant of dementia, hypertension, history of TIAs presenting with encephalopathy, hypothermia  Per daughter, patient had worsening dementia, frequently Lost walking out of the house.  But normally has good appetite.  For the last week, patient had significant increased agitation, has not been sleeping well. She also had a positive COVID about a week ago, repeat COVID was negative at admission. Patient is seen by PT/OT, no need for nursing home placement.  However unsafe discharge.  The patient has vascular dementia and does not have the ability to make medical decisions or sign paperwork for herself at this time.  Evening of 4/16, patient had a fall out of bed and has bruising left frontal and around the left eye.  4/24.  Medically stable for disposition.   Assessment and Plan: * Vascular dementia Dementia with behavioral disturbance.  On Seroquel at night.  Risperdal as needed during the day if needed.  Patient calm and cooperative this morning.  Essential hypertension On amlodipine and losartan   History of TIA (transient ischemic attack) MRI brain was negative for acute intracranial abnormality.  Hypothermia Resolved.  No signs of infection  Facial bruising, sequela Bruising around the left eye upper and lower lids and left frontal hematoma  Metabolic acidosis Improved        Subjective: Patient feels okay and offers no complaints.  Has bruising around her left eye.  Apparently had a fall on the evening of 4/16.  Physical Exam: Vitals:   05/11/22 0733 05/11/22 1529 05/12/22 0035 05/12/22 0835  BP: 127/71 (!) 141/82 (!) 119/92 126/70  Pulse: 70 87  77  Resp: Temp: 98.1 F (36.7 C) 98.5 F (36.9 C) 98.3 F (36.8 C) 98.4 F (36.9 C)  TempSrc:  Oral Oral    SpO2: 98% 99% 99% 98%  Weight:      Height:       Physical Exam HENT:     Head:     Comments: Left frontal hematoma    Mouth/Throat:     Pharynx: No oropharyngeal exudate.  Eyes:     Conjunctiva/sclera: Conjunctivae normal.     Comments: Bruising around left upper and lower eyelid  Cardiovascular:     Rate and Rhythm: Normal rate and regular rhythm.     Heart sounds: Normal heart sounds, S1 normal and S2 normal.  Pulmonary:     Breath sounds: No decreased breath sounds, wheezing, rhonchi or rales.  Abdominal:     Palpations: Abdomen is soft.     Tenderness: There is no abdominal tenderness.  Musculoskeletal:     Right lower leg: No swelling.     Left lower leg: No swelling.  Skin:    General: Skin is warm.     Findings: No rash.  Neurological:     Mental Status: She is alert.     Comments: Answers simple questions appropriately.      Data Reviewed: CT scan of the head on 4/16 shows a left frontal hematoma.  Last creatinine 0.8, last CBC normal range.  Family Communication: Updated patient's daughter on the phone  Disposition: Status is: Inpatient Remains inpatient appropriate because: We do not have a safe disposition.  We now have a Medicaid pending number.  TOC working on placement options.  Planned  Discharge Destination: Possibly memory care unit    Time spent: 27 minutes  Author: Alford Highland, MD 05/12/2022 12:19 PM  For on call review [email or web address].

## 2022-05-13 DIAGNOSIS — F01A18 Vascular dementia, mild, with other behavioral disturbance: Secondary | ICD-10-CM | POA: Diagnosis not present

## 2022-05-13 DIAGNOSIS — Z8673 Personal history of transient ischemic attack (TIA), and cerebral infarction without residual deficits: Secondary | ICD-10-CM | POA: Diagnosis not present

## 2022-05-13 DIAGNOSIS — S0083XS Contusion of other part of head, sequela: Secondary | ICD-10-CM | POA: Diagnosis not present

## 2022-05-13 DIAGNOSIS — I10 Essential (primary) hypertension: Secondary | ICD-10-CM | POA: Diagnosis not present

## 2022-05-13 DIAGNOSIS — T68XXXS Hypothermia, sequela: Secondary | ICD-10-CM | POA: Diagnosis not present

## 2022-05-13 DIAGNOSIS — E872 Acidosis, unspecified: Secondary | ICD-10-CM | POA: Diagnosis not present

## 2022-05-13 NOTE — Progress Notes (Signed)
Physical Therapy Treatment Patient Details Name: Madeline Taylor MRN: [id number] DOB: 1935-04-30 Today's Date: 05/13/2022   History of Present Illness Pt is an 87 y/o F admitted on 04/22/22 after presenting with encephalopathy & hypothermia. Pt had COVID about a week ago with worsening behavior since then, as well as sleep deprivation. PMH: dementia, HTN, TIAs    PT Comments    Pt received in room and agreeable to PT. Session focused on gait and balance challenges on outdoor surfaces consisting of uneven pavement, incline, uneven grass, ramps, and stairs. Pt required close Supervision for safety, however no LOB was observed. Pt enjoyed session and outdoor atmosphere this am, easily redirected back into building. Will continue PT per POC.   Recommendations for follow up therapy are one component of a multi-disciplinary discharge planning process, led by the attending physician.  Recommendations may be updated based on patient status, additional functional criteria and insurance authorization.  Follow Up Recommendations  Can patient physically be transported by private vehicle: Yes    Assistance Recommended at Discharge Frequent or constant Supervision/Assistance  Patient can return home with the following A little help with walking and/or transfers;A little help with bathing/dressing/bathroom;Assistance with cooking/housework;Assist for transportation;Help with stairs or ramp for entrance;Direct supervision/assist for medications management;Direct supervision/assist for financial management   Equipment Recommendations  None recommended by PT    Recommendations for Other Services       Precautions / Restrictions Precautions Precautions: Fall Restrictions Weight Bearing Restrictions: No     Mobility  Bed Mobility Overal bed mobility: Independent             General bed mobility comments: NT, pt received siting EOB    Transfers Overall transfer level: Independent Equipment  used: None Transfers: Sit to/from Stand Sit to Stand: Modified independent (Device/Increase time)           General transfer comment: Good speed and effort as well as eccentric and concentric control and stability    Ambulation/Gait Ambulation/Gait assistance: Supervision Gait Distance (Feet):  (500+) Assistive device: None Gait Pattern/deviations: WFL(Within Functional Limits) Gait velocity: decreased     General Gait Details: Generally steady during ambulation on outdoor surfaces including uneven pavement, grass, ramp, and stairs without LOB   Stairs Stairs: Yes Stairs assistance: Min guard Stair Management: Alternating pattern, Forwards, No rails Number of Stairs: 5 General stair comments: Pt able to ascend/descend 5 stairs alternating step pattern throughout with no overt LOB or instability this session   Wheelchair Mobility    Modified Rankin (Stroke Patients Only)       Balance Overall balance assessment: Needs assistance Sitting-balance support: Feet supported Sitting balance-Leahy Scale: Normal     Standing balance support: No upper extremity supported Standing balance-Leahy Scale: Good               High level balance activites: Side stepping, Direction changes, Turns, Sudden stops, Head turns High Level Balance Comments: Pt without LOB on outdoor surfaces and multiple changes in directions without AD            Cognition Arousal/Alertness: Awake/alert Behavior During Therapy: WFL for tasks assessed/performed Overall Cognitive Status: History of cognitive impairments - at baseline                                 General Comments: Pt is A&O to self. Pleasant, redirectable, STM deficits        Exercises General Exercises - Lower  Extremity Ankle Circles/Pumps: AROM, Both, 15 reps Long Arc Quad: AROM, Both, 10 reps Hip Flexion/Marching: AROM, Both, 10 reps, Seated    General Comments        Pertinent Vitals/Pain Pain  Assessment Pain Assessment: No/denies pain    Home Living                          Prior Function            PT Goals (current goals can now be found in the care plan section) Acute Rehab PT Goals Patient Stated Goal: none stated Progress towards PT goals: Progressing toward goals    Frequency    Min 2X/week      PT Plan Current plan remains appropriate    Co-evaluation              AM-PAC PT "6 Clicks" Mobility   Outcome Measure  Help needed turning from your back to your side while in a flat bed without using bedrails?: None Help needed moving from lying on your back to sitting on the side of a flat bed without using bedrails?: None Help needed moving to and from a bed to a chair (including a wheelchair)?: None Help needed standing up from a chair using your arms (e.g., wheelchair or bedside chair)?: None Help needed to walk in hospital room?: A Little Help needed climbing 3-5 steps with a railing? : A Little 6 Click Score: 22    End of Session Equipment Utilized During Treatment: Gait belt Activity Tolerance: Patient tolerated treatment well Patient left: in chair;with call bell/phone within reach;with nursing/sitter in room Nurse Communication: Mobility status PT Visit Diagnosis: Muscle weakness (generalized) (M62.81);Unsteadiness on feet (R26.81)     Time: 3474-2595 PT Time Calculation (min) (ACUTE ONLY): 35 min  Charges:  $Gait Training: 8-22 mins $Neuromuscular Re-education: 8-22 mins                    Zadie Cleverly, PTA  Jannet Askew 05/13/2022, 1:40 PM

## 2022-05-13 NOTE — Progress Notes (Signed)
  Progress Note   Patient: Madeline Taylor ZOX:[id number] DOB: 08/08/1935 DOA: 04/22/2022     20 DOS: the patient was seen and examined on 05/13/2022   Brief hospital course: 87 y.o. female with medical history significant of dementia, hypertension, history of TIAs presenting with encephalopathy, hypothermia  Per daughter, patient had worsening dementia, frequently Lost walking out of the house.  But normally has good appetite.  For the last week, patient had significant increased agitation, has not been sleeping well. She also had a positive COVID about a week ago, repeat COVID was negative at admission. Patient is seen by PT/OT, no need for nursing home placement.  However unsafe discharge.  The patient has vascular dementia and does not have the ability to make medical decisions or sign paperwork for herself at this time.  Evening of 4/16, patient had a fall out of bed and has bruising left frontal and around the left eye.  4/24.  Medically stable for disposition.  Assessment and Plan: * Vascular dementia Dementia with behavioral disturbance.  On Seroquel at night.  Risperdal as needed during the day if needed.  Patient calm and cooperative this morning.  Essential hypertension On amlodipine and losartan   History of TIA (transient ischemic attack) MRI brain was negative for acute intracranial abnormality.  Hypothermia Resolved.  No signs of infection  Facial bruising, sequela Bruising around the left eye upper and lower lids and left frontal hematoma  Metabolic acidosis Improved        Subjective: Patient feels perfect.  She states there is nothing wrong.  Asking about when she can go home.  Noted 20 days ago with altered mental status  Physical Exam: Vitals:   05/12/22 1507 05/12/22 2312 05/13/22 0732 05/13/22 1334  BP: 133/69 130/72 125/82 134/79  Pulse: 78 79 78 81  Resp: Temp: 98.1 F (36.7 C) 98.2 F (36.8 C) 98.7 F (37.1 C) 98 F (36.7 C)   TempSrc:  Oral    SpO2: 99% 98% 95% 100%  Weight:      Height:       Physical Exam HENT:     Head:     Comments: Left frontal hematoma    Mouth/Throat:     Pharynx: No oropharyngeal exudate.  Eyes:     Conjunctiva/sclera: Conjunctivae normal.     Comments: Bruising around left upper and lower eyelid  Cardiovascular:     Rate and Rhythm: Normal rate and regular rhythm.     Heart sounds: Normal heart sounds, S1 normal and S2 normal.  Pulmonary:     Breath sounds: No decreased breath sounds, wheezing, rhonchi or rales.  Abdominal:     Palpations: Abdomen is soft.     Tenderness: There is no abdominal tenderness.  Musculoskeletal:     Right lower leg: No swelling.     Left lower leg: No swelling.  Skin:    General: Skin is warm.     Findings: No rash.  Neurological:     Mental Status: She is alert.     Comments: Answers simple questions appropriately.      Data Reviewed: No new data  Family Communication: Updated patient's daughter yesterday.  Disposition: Status is: Inpatient Remains inpatient appropriate because: We do not have a safe disposition at this point.  Planned Discharge Destination: Potentially memory care unit    Time spent: 28 minutes  Author: Alford Highland, MD 05/13/2022 3:20 PM  For on call review [email or web address].

## 2022-05-13 NOTE — Plan of Care (Signed)
  Problem: Health Behavior/Discharge Planning: Goal: Ability to manage health-related needs will improve Outcome: Progressing   Problem: Clinical Measurements: Goal: Ability to maintain clinical measurements within normal limits will improve Outcome: Progressing Goal: Diagnostic test results will improve Outcome: Progressing   Problem: Nutrition: Goal: Adequate nutrition will be maintained Outcome: Progressing   Problem: Elimination: Goal: Will not experience complications related to bowel motility Outcome: Progressing Goal: Will not experience complications related to urinary retention Outcome: Progressing   Problem: Pain Managment: Goal: General experience of comfort will improve Outcome: Progressing

## 2022-05-14 DIAGNOSIS — T68XXXS Hypothermia, sequela: Secondary | ICD-10-CM | POA: Diagnosis not present

## 2022-05-14 DIAGNOSIS — F01B18 Vascular dementia, moderate, with other behavioral disturbance: Secondary | ICD-10-CM | POA: Diagnosis not present

## 2022-05-14 DIAGNOSIS — I10 Essential (primary) hypertension: Secondary | ICD-10-CM | POA: Diagnosis not present

## 2022-05-14 DIAGNOSIS — Z8673 Personal history of transient ischemic attack (TIA), and cerebral infarction without residual deficits: Secondary | ICD-10-CM | POA: Diagnosis not present

## 2022-05-14 NOTE — TOC Progression Note (Signed)
Transition of Care [facility]) - Progression Note    Patient Details  Name: Madeline Taylor MRN: [id number] Date of Birth: Feb 16, 1935  [facility] CM/SW Contact  Marlowe Sax, RN Phone Number: 05/14/2022, 10:17 AM  Clinical Narrative:    Medicaid ID number [id number]   Expected Discharge Plan: Long Term Nursing Home Barriers to Discharge: No SNF bed  Expected Discharge Plan and Services       Living arrangements for the past 2 months: Single Family Home                 DME Arranged: Walker rolling DME Agency: [employer] Date DME Agency Contacted: 04/25/22 Time DME Agency Contacted: [phone] Representative spoke with at DME Agency: [name]             Social Determinants of Health (SDOH) Interventions SDOH Screenings   Food Insecurity: Patient Unable To Answer (04/23/2022)  Housing: Low Risk  (04/23/2022)  Transportation Needs: No Transportation Needs (04/23/2022)  Utilities: Not At Risk (04/23/2022)  Alcohol Screen: Low Risk  (05/21/2021)  Depression (PHQ2-9): Low Risk  (05/21/2021)  Financial Resource Strain: Low Risk  (05/21/2021)  Physical Activity: Inactive (05/21/2021)  Social Connections: Moderately Integrated (05/21/2021)  Stress: No Stress Concern Present (05/21/2021)  Tobacco Use: Low Risk  (04/23/2022)    Readmission Risk Interventions     No data to display

## 2022-05-14 NOTE — Plan of Care (Signed)

## 2022-05-14 NOTE — Progress Notes (Signed)
Mobility Specialist - Progress Note    05/14/22 1028  Mobility  Activity Ambulated with assistance in hallway  Level of Assistance Standby assist, set-up cues, supervision of patient - no hands on  Assistive Device None  Distance Ambulated (ft) 480 ft  Activity Response Tolerated well  $Mobility charge 1 Mobility   Pt standing in room upon entry, utilizing RA. Pt amb three laps around the NS with supervision, tolerated well. During amb Pt expressed having some continued "hip pain".  Pt returned to room, left amb in room with sitter present at bedside.   Zetta Bills Mobility Specialist 05/14/22 10:31 AM

## 2022-05-14 NOTE — Progress Notes (Signed)
  Progress Note   Patient: Madeline Taylor:[id number] DOB: 03/13/1935 DOA: 04/22/2022     21 DOS: the patient was seen and examined on 05/14/2022   Brief hospital course: 87 y.o. female with medical history significant of dementia, hypertension, history of TIAs presenting with encephalopathy, hypothermia  Per daughter, patient had worsening dementia, frequently Lost walking out of the house.  But normally has good appetite.  For the last week, patient had significant increased agitation, has not been sleeping well. She also had a positive COVID about a week ago, repeat COVID was negative at admission. Patient is seen by PT/OT, no need for nursing home placement.  However unsafe discharge.  The patient has vascular dementia and does not have the ability to make medical decisions or sign paperwork for herself at this time.  Evening of 4/16, patient had a fall out of bed and has bruising left frontal and around the left eye.  4/24.  Medically stable for disposition.  Assessment and Plan: * Vascular dementia (HCC) Dementia with behavioral disturbance.  On Seroquel at night.  Risperdal as needed during the day if needed.  Patient calm and cooperative this morning.  Essential hypertension On amlodipine and losartan   History of TIA (transient ischemic attack) MRI brain was negative for acute intracranial abnormality.  Hypothermia Resolved.  No signs of infection  Facial bruising, sequela Bruising around the left eye upper and lower lids and left frontal hematoma  Metabolic acidosis Improved        Subjective: Patient feels fine and offers no complaints.  Admitted with altered mental status and awaiting placement  Physical Exam: Vitals:   05/13/22 1334 05/13/22 2312 05/13/22 2333 05/14/22 0725  BP: 134/79 (!) 172/61 (!) 151/65 133/75  Pulse: 81 80 73 72  Resp: 17 16  17   Temp: 98 F (36.7 C) 98 F (36.7 C)  98.1 F (36.7 C)  TempSrc:      SpO2: 100% 100%  100%  Weight:       Height:       Physical Exam HENT:     Head:     Comments: Left frontal hematoma    Mouth/Throat:     Pharynx: No oropharyngeal exudate.  Eyes:     Conjunctiva/sclera: Conjunctivae normal.     Comments: Bruising around left upper and lower eyelid  Cardiovascular:     Rate and Rhythm: Normal rate and regular rhythm.     Heart sounds: Normal heart sounds, S1 normal and S2 normal.  Pulmonary:     Breath sounds: No decreased breath sounds, wheezing, rhonchi or rales.  Abdominal:     Palpations: Abdomen is soft.     Tenderness: There is no abdominal tenderness.  Musculoskeletal:     Right lower leg: No swelling.     Left lower leg: No swelling.  Skin:    General: Skin is warm.     Findings: No rash.  Neurological:     Mental Status: She is alert.     Comments: Answers simple questions appropriately.      Data Reviewed: No new data  Family Communication: Updated patient's daughter on the phone  Disposition: Status is: Inpatient Remains inpatient appropriate because: We do not have a safe disposition.  Planned Discharge Destination: Potentially memory care unit    Time spent: 25 minutes  Author: Alford Highland, MD 05/14/2022 3:55 PM  For on call review [email or web address].

## 2022-05-14 NOTE — NC FL2 (Signed)
[place] MEDICAID FL2 LEVEL OF CARE FORM     IDENTIFICATION  Patient Name: Madeline Taylor Birthdate: [birth year] Sex: female Admission Date (Current Location): 04/22/2022  [place] and [place] Number:  [address] [id number] Q Facility and Address:  [facility], [address] [place], [place] [address]      Provider Number: [id number]  Attending Physician Name and Address:  Alford Highland, MD  Relative Name and Phone Number:  Annye English Daughter   [phone]  [facility] Spouse   [phone]  Noralee Stain Daughter   [phone]    Current Level of Care: Hospital Recommended Level of Care: Skilled Nursing Facility Prior Approval Number:    Date Approved/Denied:   PASRR Number: Pending  Discharge Plan: SNF    Current Diagnoses: Patient Active Problem List   Diagnosis Date Noted   Facial bruising, sequela 05/12/2022   Metabolic acidosis 04/24/2022   Dementia with behavioral disturbance (HCC) 04/24/2022   Hypothermia 04/23/2022   Mixed incontinence 02/12/2021   Aortic atherosclerosis (HCC) 11/17/2020   Vascular dementia (HCC) 11/17/2020   History of TIA (transient ischemic attack) 11/17/2020   Osteoarthritis of multiple joints 05/18/2018   Hyperlipidemia 07/29/2016   Essential hypertension 07/29/2016   Osteoporosis of femur without pathological fracture 07/29/2016    Orientation RESPIRATION BLADDER Height & Weight     Self, Place  Normal Continent Weight: 108 lb 14.5 oz (49.4 kg) Height:  4\' 11"  (149.9 cm)  BEHAVIORAL SYMPTOMS/MOOD NEUROLOGICAL BOWEL NUTRITION STATUS  [place]   Incontinent Diet  AMBULATORY STATUS COMMUNICATION OF NEEDS Skin   Supervision Verbally Normal                       Personal Care Assistance Level of Assistance  Bathing, Feeding, Dressing Bathing Assistance: Limited assistance Feeding assistance: Independent Dressing Assistance: Limited assistance     Functional  Limitations Info  Sight, Hearing, Speech Sight Info: Adequate Hearing Info: Adequate Speech Info: Adequate    SPECIAL CARE FACTORS FREQUENCY  PT (By licensed PT), OT (By licensed OT)     PT Frequency: Minimum 5x a week OT Frequency: Minimum 5x a week            Contractures Contractures Info: Not present    Additional Factors Info  Code Status, Allergies, Psychotropic Code Status Info: Full Code Allergies Info: Lisinopril  Statins Psychotropic Info: QUEtiapine (SEROQUEL) tablet 25 mg         Current Medications (05/14/2022):  This is the current hospital active medication list Current Facility-Administered Medications  Medication Dose Route Frequency Provider Last Rate Last Admin   alum & mag hydroxide-simeth (MAALOX/MYLANTA) 200-200-20 MG/5ML suspension 30 mL  30 mL Oral Q6H PRN Sharman Cheek, MD       amLODipine (NORVASC) tablet 5 mg  5 mg Oral Daily Marrion Coy, MD   5 mg at 05/13/22 1040   ibuprofen (ADVIL) tablet 600 mg  600 mg Oral Q8H PRN Sharman Cheek, MD   600 mg at 05/09/22 2034   losartan (COZAAR) tablet 50 mg  50 mg Oral Daily Marrion Coy, MD   50 mg at 05/12/22 0930   ondansetron (ZOFRAN) tablet 4 mg  4 mg Oral Q6H PRN Floydene Flock, MD       Or   ondansetron [facility]) injection 4 mg  4 mg Intravenous Q6H PRN Floydene Flock, MD       Oral care mouth rinse  15 mL Mouth Rinse PRN Alford Highland, MD  QUEtiapine (SEROQUEL) tablet 25 mg  25 mg Oral QHS Marrion Coy, MD   25 mg at 05/13/22 2111   risperiDONE (RISPERDAL) tablet 1 mg  1 mg Oral Q12H PRN Alford Highland, MD   1 mg at 05/10/22 1634   senna-docusate (Senokot-S) tablet 2 tablet  2 tablet Oral BID Marrion Coy, MD   2 tablet at 05/13/22 2111     Discharge Medications: Please see discharge summary for a list of discharge medications.  Relevant Imaging Results:  Relevant Lab Results:   Additional Information SSN [id number]  Darleene Cleaver, LCSW

## 2022-05-14 NOTE — TOC Progression Note (Signed)
Transition of Care [facility]) - Progression Note    Patient Details  Name: Madeline Taylor MRN: [id number] Date of Birth: 08/11/1935  Transition of Care [facility]) CM/SW Contact  Darleene Cleaver, [place] Phone Number: 05/14/2022, 10:09 AM  Clinical Narrative:     Patient's information has been sent to SNFs to see if any facilities can accept patient.  [place] has been started and is pending.  Expected Discharge Plan: Long Term Nursing Home Barriers to Discharge: No SNF bed  Expected Discharge Plan and Services       Living arrangements for the past 2 months: Single Family Home                 DME Arranged: Walker rolling DME Agency: [employer] Date DME Agency Contacted: 04/25/22 Time DME Agency Contacted: [phone] Representative spoke with at DME Agency: [name]             Social Determinants of Health (SDOH) Interventions SDOH Screenings   Food Insecurity: Patient Unable To Answer (04/23/2022)  Housing: Low Risk  (04/23/2022)  Transportation Needs: No Transportation Needs (04/23/2022)  Utilities: Not At Risk (04/23/2022)  Alcohol Screen: Low Risk  (05/21/2021)  Depression (PHQ2-9): Low Risk  (05/21/2021)  Financial Resource Strain: Low Risk  (05/21/2021)  Physical Activity: Inactive (05/21/2021)  Social Connections: Moderately Integrated (05/21/2021)  Stress: No Stress Concern Present (05/21/2021)  Tobacco Use: Low Risk  (04/23/2022)    Readmission Risk Interventions     No data to display

## 2022-05-14 NOTE — Plan of Care (Signed)
  Problem: Clinical Measurements: Goal: Ability to maintain clinical measurements within normal limits will improve Outcome: Progressing Goal: Will remain free from infection Outcome: Progressing   Problem: Nutrition: Goal: Adequate nutrition will be maintained Outcome: Progressing   Problem: Elimination: Goal: Will not experience complications related to bowel motility Outcome: Progressing Goal: Will not experience complications related to urinary retention Outcome: Progressing   

## 2022-05-15 ENCOUNTER — Inpatient Hospital Stay: Payer: Medicare Other

## 2022-05-15 DIAGNOSIS — T68XXXS Hypothermia, sequela: Secondary | ICD-10-CM | POA: Diagnosis not present

## 2022-05-15 DIAGNOSIS — I10 Essential (primary) hypertension: Secondary | ICD-10-CM | POA: Diagnosis not present

## 2022-05-15 DIAGNOSIS — F01B18 Vascular dementia, moderate, with other behavioral disturbance: Secondary | ICD-10-CM | POA: Diagnosis not present

## 2022-05-15 DIAGNOSIS — Z8673 Personal history of transient ischemic attack (TIA), and cerebral infarction without residual deficits: Secondary | ICD-10-CM | POA: Diagnosis not present

## 2022-05-15 DIAGNOSIS — R509 Fever, unspecified: Secondary | ICD-10-CM | POA: Diagnosis not present

## 2022-05-15 LAB — CBC WITH DIFFERENTIAL/PLATELET
Abs Immature Granulocytes: 0.05 10*3/uL (ref 0.00–0.07)
Basophils Absolute: 0 10*3/uL (ref 0.0–0.1)
Basophils Relative: 0 %
Eosinophils Absolute: 0 10*3/uL (ref 0.0–0.5)
Eosinophils Relative: 0 %
HCT: 38.9 % (ref 36.0–46.0)
Hemoglobin: 12.1 g/dL (ref 12.0–15.0)
Immature Granulocytes: 1 %
Lymphocytes Relative: 4 %
Lymphs Abs: 0.3 10*3/uL — ABNORMAL LOW (ref 0.7–4.0)
MCH: 29.7 pg (ref 26.0–34.0)
MCHC: 31.1 g/dL (ref 30.0–36.0)
MCV: 95.3 fL (ref 80.0–100.0)
Monocytes Absolute: 0.3 10*3/uL (ref 0.1–1.0)
Monocytes Relative: 3 %
Neutro Abs: 8.7 10*3/uL — ABNORMAL HIGH (ref 1.7–7.7)
Neutrophils Relative %: 92 %
Platelets: 216 10*3/uL (ref 150–400)
RBC: 4.08 MIL/uL (ref 3.87–5.11)
RDW: 13 % (ref 11.5–15.5)
WBC: 9.5 10*3/uL (ref 4.0–10.5)
nRBC: 0 % (ref 0.0–0.2)

## 2022-05-15 LAB — BASIC METABOLIC PANEL
Anion gap: 10 (ref 5–15)
BUN: 23 mg/dL (ref 8–23)
CO2: 20 mmol/L — ABNORMAL LOW (ref 22–32)
Calcium: 8.7 mg/dL — ABNORMAL LOW (ref 8.9–10.3)
Chloride: 104 mmol/L (ref 98–111)
Creatinine, Ser: 0.83 mg/dL (ref 0.44–1.00)
GFR, Estimated: >60 mL/min (ref >60)
Glucose, Bld: 130 mg/dL — ABNORMAL HIGH (ref 70–99)
Potassium: 3.6 mmol/L (ref 3.5–5.1)
Sodium: 134 mmol/L — ABNORMAL LOW (ref 135–145)

## 2022-05-15 LAB — SARS CORONAVIRUS 2 BY RT PCR: SARS Coronavirus 2 by RT PCR: NEGATIVE

## 2022-05-15 LAB — PROCALCITONIN: Procalcitonin: 12.08 ng/mL

## 2022-05-15 MED ORDER — LACTATED RINGERS IV SOLN: INTRAVENOUS | Status: DC

## 2022-05-15 MED ORDER — SODIUM CHLORIDE 0.9 % IV SOLN
2.0000 g | INTRAVENOUS | Status: DC
  Administered 2022-05-15 – 2022-05-17 (×3): 2 g via INTRAVENOUS
  Filled 2022-05-15: qty 2
  Filled 2022-05-15 (×3): qty 20

## 2022-05-15 MED ORDER — AZITHROMYCIN 250 MG PO TABS: 250.0000 mg | ORAL_TABLET | Freq: Every day | ORAL | Status: DC

## 2022-05-15 MED ORDER — AZITHROMYCIN 500 MG PO TABS
500.0000 mg | ORAL_TABLET | Freq: Every day | ORAL | Status: AC
  Administered 2022-05-15: 500 mg via ORAL
  Filled 2022-05-15: qty 1

## 2022-05-15 MED ORDER — SODIUM CHLORIDE 0.9 % IV BOLUS
500.0000 mL | Freq: Once | INTRAVENOUS | Status: AC
  Administered 2022-05-15: 500 mL via INTRAVENOUS

## 2022-05-15 MED ORDER — ACETAMINOPHEN 325 MG PO TABS
650.0000 mg | ORAL_TABLET | Freq: Four times a day (QID) | ORAL | Status: DC | PRN
  Administered 2022-05-15 – 2022-07-16 (×36): 650 mg via ORAL
  Filled 2022-05-15 (×37): qty 2

## 2022-05-15 NOTE — Plan of Care (Signed)

## 2022-05-15 NOTE — Progress Notes (Signed)
Mobility Specialist - Progress Note   05/15/22 0930  Mobility  Activity Ambulated independently in hallway  Level of Assistance Independent  Assistive Device None  Distance Ambulated (ft) 1000 ft  Activity Response Tolerated well  Mobility Referral Yes  $Mobility charge 1 Mobility   Pt supine in bed on RA upon arrival. PT ambulates to main entrance and back indep. Pt returns to EOB with needs in reach and sitter present.   Terrilyn Saver  Mobility Specialist  05/15/22 9:31 AM

## 2022-05-15 NOTE — Progress Notes (Signed)
Progress Note   Patient: Madeline Taylor:[id number] DOB: 1935-07-04 DOA: 04/22/2022     22 DOS: the patient was seen and examined on 05/15/2022   Brief hospital course: 87 y.o. female with medical history significant of dementia, hypertension, history of TIAs presenting with encephalopathy, hypothermia  Per daughter, patient had worsening dementia, frequently Lost walking out of the house.  But normally has good appetite.  For the last week, patient had significant increased agitation, has not been sleeping well. She also had a positive COVID about a week ago, repeat COVID was negative at admission. Patient is seen by PT/OT, no need for nursing home placement.  However unsafe discharge.  The patient has vascular dementia and does not have the ability to make medical decisions or sign paperwork for herself at this time.  Evening of 4/16, patient had a fall out of bed and has bruising left frontal and around the left eye.  4/24.  Medically stable for disposition.  Patient has been calm and cooperative during the hospital stay.  Assessment and Plan: * Vascular dementia (HCC) Dementia with behavioral disturbance.  On Seroquel at night.  Risperdal as needed during the day only if needed.  Patient calm and cooperative and answers questions.  Essential hypertension On amlodipine and losartan   History of TIA (transient ischemic attack) MRI brain was negative for acute intracranial abnormality.  Hypothermia Resolved.  No signs of infection  Facial bruising, sequela Bruising around the left eye upper and lower lids and left frontal hematoma  Metabolic acidosis Improved        Subjective: Patient feels fantastic offers no complaints.  Admitted with altered mental status and awaiting placement.  Patient states that she slept well and aide also confirms that she slept last night.  Physical Exam: Vitals:   05/13/22 2333 05/14/22 0725 05/15/22 0005 05/15/22 0742  BP: (!) 151/65 133/75  (!) 142/57 128/70  Pulse: 73 72 68 79  Resp:  17 20 17   Temp:  98.1 F (36.7 C) 98.6 F (37 C) 98.3 F (36.8 C)  TempSrc:      SpO2:  100% 99% 100%  Weight:      Height:       Physical Exam HENT:     Head:     Comments: Left frontal hematoma    Mouth/Throat:     Pharynx: No oropharyngeal exudate.  Eyes:     Conjunctiva/sclera: Conjunctivae normal.     Comments: Bruising around left upper and lower eyelid  Cardiovascular:     Rate and Rhythm: Normal rate and regular rhythm.     Heart sounds: Normal heart sounds, S1 normal and S2 normal.  Pulmonary:     Breath sounds: No decreased breath sounds, wheezing, rhonchi or rales.  Abdominal:     Palpations: Abdomen is soft.     Tenderness: There is no abdominal tenderness.  Musculoskeletal:     Right lower leg: No swelling.     Left lower leg: No swelling.  Skin:    General: Skin is warm.     Findings: No rash.  Neurological:     Mental Status: She is alert.     Comments: Answers simple questions appropriately.      Data Reviewed: No new data  Family Communication: Updated daughter yesterday  Disposition: Status is: Inpatient Remains inpatient appropriate because: We do not have a safe disposition  Planned Discharge Destination: TOC to look into potential memory care unit    Time spent: 26 minutes  Author: Alford Highland, MD 05/15/2022 11:13 AM  For on call review [email or web address].

## 2022-05-16 DIAGNOSIS — I10 Essential (primary) hypertension: Secondary | ICD-10-CM | POA: Diagnosis not present

## 2022-05-16 DIAGNOSIS — F01B18 Vascular dementia, moderate, with other behavioral disturbance: Secondary | ICD-10-CM | POA: Diagnosis not present

## 2022-05-16 DIAGNOSIS — Z8673 Personal history of transient ischemic attack (TIA), and cerebral infarction without residual deficits: Secondary | ICD-10-CM | POA: Diagnosis not present

## 2022-05-16 DIAGNOSIS — B9620 Unspecified Escherichia coli [E. coli] as the cause of diseases classified elsewhere: Secondary | ICD-10-CM

## 2022-05-16 DIAGNOSIS — R7881 Bacteremia: Secondary | ICD-10-CM | POA: Diagnosis not present

## 2022-05-16 DIAGNOSIS — E871 Hypo-osmolality and hyponatremia: Secondary | ICD-10-CM | POA: Insufficient documentation

## 2022-05-16 LAB — BLOOD CULTURE ID PANEL (REFLEXED) - BCID2
A.calcoaceticus-baumannii: NOT DETECTED
Bacteroides fragilis: NOT DETECTED
CTX-M ESBL: NOT DETECTED
Candida albicans: NOT DETECTED
Candida auris: NOT DETECTED
Candida glabrata: NOT DETECTED
Candida krusei: NOT DETECTED
Candida parapsilosis: NOT DETECTED
Candida tropicalis: NOT DETECTED
Carbapenem resist OXA 48 LIKE: NOT DETECTED
Carbapenem resistance IMP: NOT DETECTED
Carbapenem resistance KPC: NOT DETECTED
Carbapenem resistance NDM: NOT DETECTED
Carbapenem resistance VIM: NOT DETECTED
Cryptococcus neoformans/gattii: NOT DETECTED
Enterobacter cloacae complex: NOT DETECTED
Enterobacterales: DETECTED — AB
Enterococcus Faecium: NOT DETECTED
Enterococcus faecalis: NOT DETECTED
Escherichia coli: DETECTED — AB
Haemophilus influenzae: NOT DETECTED
Klebsiella aerogenes: NOT DETECTED
Klebsiella oxytoca: NOT DETECTED
Klebsiella pneumoniae: NOT DETECTED
Listeria monocytogenes: NOT DETECTED
Neisseria meningitidis: NOT DETECTED
Proteus species: NOT DETECTED
Pseudomonas aeruginosa: NOT DETECTED
Salmonella species: NOT DETECTED
Serratia marcescens: NOT DETECTED
Staphylococcus species: NOT DETECTED
Stenotrophomonas maltophilia: NOT DETECTED
Streptococcus agalactiae: NOT DETECTED
Streptococcus pneumoniae: NOT DETECTED
Streptococcus pyogenes: NOT DETECTED
Streptococcus species: NOT DETECTED

## 2022-05-16 LAB — RESPIRATORY PANEL BY PCR

## 2022-05-16 LAB — URINALYSIS, W/ REFLEX TO CULTURE (INFECTION SUSPECTED)
Bilirubin Urine: NEGATIVE
Glucose, UA: NEGATIVE mg/dL
Ketones, ur: NEGATIVE mg/dL
Nitrite: NEGATIVE
Protein, ur: NEGATIVE mg/dL
Specific Gravity, Urine: 1.012 (ref 1.005–1.030)
WBC, UA: >50 WBC/hpf (ref 0–5)
pH: 6 (ref 5.0–8.0)

## 2022-05-16 LAB — CULTURE, BLOOD (ROUTINE X 2): Special Requests: ADEQUATE

## 2022-05-16 NOTE — Assessment & Plan Note (Addendum)
Last sodium normal range 

## 2022-05-16 NOTE — Progress Notes (Signed)
PHARMACY - PHYSICIAN COMMUNICATION CRITICAL VALUE ALERT - BLOOD CULTURE IDENTIFICATION (BCID)  Madeline Taylor is an 87 y.o. female who presented to [facility] on 04/22/2022 with a chief complaint of vascular dementia.   Assessment:  E coli in 4 of 4 bottles, no resistance detected.  (include suspected source if known)  Name of physician (or Provider) Contacted: Manuela Schwartz, NP   Current antibiotics: Ceftriaxone 2 gm IV Q24H and Azithromycin 250 mg IV X 4 days ( 500 mg dose already given)   Changes to prescribed antibiotics recommended:  Recommendations accepted by provider  Will d/c azithromycin and continue ceftriaxone.   Results for orders placed or performed during the hospital encounter of 04/22/22  Blood Culture ID Panel (Reflexed) (Collected: 05/15/2022  4:03 PM)  Result Value Ref Range   Enterococcus faecalis NOT DETECTED NOT DETECTED   Enterococcus Faecium NOT DETECTED NOT DETECTED   Listeria monocytogenes NOT DETECTED NOT DETECTED   Staphylococcus species NOT DETECTED NOT DETECTED   Staphylococcus aureus (BCID) PENDING NOT DETECTED   Staphylococcus epidermidis PENDING NOT DETECTED   Staphylococcus lugdunensis PENDING NOT DETECTED   Streptococcus species NOT DETECTED NOT DETECTED   Streptococcus agalactiae NOT DETECTED NOT DETECTED   Streptococcus pneumoniae NOT DETECTED NOT DETECTED   Streptococcus pyogenes NOT DETECTED NOT DETECTED   A.calcoaceticus-baumannii NOT DETECTED NOT DETECTED   Bacteroides fragilis NOT DETECTED NOT DETECTED   Enterobacterales DETECTED (A) NOT DETECTED   Enterobacter cloacae complex NOT DETECTED NOT DETECTED   Escherichia coli DETECTED (A) NOT DETECTED   Klebsiella aerogenes NOT DETECTED NOT DETECTED   Klebsiella oxytoca NOT DETECTED NOT DETECTED   Klebsiella pneumoniae NOT DETECTED NOT DETECTED   Proteus species NOT DETECTED NOT DETECTED   Salmonella species NOT DETECTED NOT DETECTED   Serratia marcescens NOT DETECTED NOT DETECTED    Haemophilus influenzae NOT DETECTED NOT DETECTED   Neisseria meningitidis NOT DETECTED NOT DETECTED   Pseudomonas aeruginosa NOT DETECTED NOT DETECTED   Stenotrophomonas maltophilia NOT DETECTED NOT DETECTED   Candida albicans NOT DETECTED NOT DETECTED   Candida auris NOT DETECTED NOT DETECTED   Candida glabrata NOT DETECTED NOT DETECTED   Candida krusei NOT DETECTED NOT DETECTED   Candida parapsilosis NOT DETECTED NOT DETECTED   Candida tropicalis NOT DETECTED NOT DETECTED   Cryptococcus neoformans/gattii NOT DETECTED NOT DETECTED   CTX-M ESBL NOT DETECTED NOT DETECTED   Carbapenem resistance IMP NOT DETECTED NOT DETECTED   Carbapenem resistance KPC NOT DETECTED NOT DETECTED   Carbapenem resistance NDM NOT DETECTED NOT DETECTED   Carbapenem resist OXA 48 LIKE NOT DETECTED NOT DETECTED   Carbapenem resistance VIM NOT DETECTED NOT DETECTED    Bret Vanessen D 05/16/2022  2:58 AM

## 2022-05-16 NOTE — Progress Notes (Signed)
Progress Note   Patient: Madeline Taylor ZOX:[id number] DOB: 21-Nov-1935 DOA: 04/22/2022     23 DOS: the patient was seen and examined on 05/16/2022   Brief hospital course: 87 y.o. female with medical history significant of dementia, hypertension, history of TIAs presenting with encephalopathy, hypothermia  Per daughter, patient had worsening dementia, frequently Lost walking out of the house.  But normally has good appetite.  For the last week, patient had significant increased agitation, has not been sleeping well. She also had a positive COVID about a week ago, repeat COVID was negative at admission. Patient is seen by PT/OT, no need for nursing home placement.  However unsafe discharge.  The patient has vascular dementia and does not have the ability to make medical decisions or sign paperwork for herself at this time.  Evening of 4/16, patient had a fall out of bed and has bruising left frontal and around the left eye.  4/24.  Medically stable for disposition.  Patient has been calm and cooperative during the hospital stay. 4/27 in the evening spiked a fever of 102.  Blood cultures positive for E. coli and Started empirically on antibiotics and IV fluids.  Will need a few days of IV antibiotics until sensitivities back.  Likely urine source with urinalysis positive.  Urine culture still pending.  Assessment and Plan: * Bacteremia due to Escherichia coli Fever of 102 on 4/27.  Blood cultures positive for E. coli.  Likely urine source with urinalysis positive.  Started empirically on Rocephin on 4/27.  Follow-up sensitivities on blood and urine cultures when available.  No urinary complaints back patient does not complain about anything when I am in the room with her.  Vascular dementia (HCC) Dementia with behavioral disturbance.  On Seroquel at night.  Risperdal as needed during the day only if needed.  Patient calm and cooperative and answers questions.  Essential hypertension On amlodipine  and losartan   History of TIA (transient ischemic attack) MRI brain was negative for acute intracranial abnormality.  Hypothermia Resolved.  Hyponatremia Sodium 1 point less than normal range.  Facial bruising, sequela Bruising around the left eye upper and lower lids and left frontal hematoma  Metabolic acidosis Improved        Subjective: Patient feels okay and offers no complaints.  Could not tell me how she was feeling yesterday.  Yesterday afternoon spiked a fever of 102 and found to have E. coli in the bloodstream.  Initially admitted with altered mental status.  Physical Exam: Vitals:   05/15/22 1618 05/15/22 1907 05/16/22 0646 05/16/22 1007  BP: (!) 162/68 100/60 (!) 113/54 105/85  Pulse: (!) 115  71 64  Resp:   18   Temp: (!) 100.8 F (38.2 C) 99.1 F (37.3 C) 99.2 F (37.3 C)   TempSrc: Oral Oral    SpO2: 99% 100% 98% 98%  Weight:      Height:       Physical Exam HENT:     Head:     Comments: Left frontal hematoma    Mouth/Throat:     Pharynx: No oropharyngeal exudate.  Eyes:     Conjunctiva/sclera: Conjunctivae normal.     Comments: Bruising around left upper and lower eyelid  Cardiovascular:     Rate and Rhythm: Normal rate and regular rhythm.     Heart sounds: Normal heart sounds, S1 normal and S2 normal.  Pulmonary:     Breath sounds: No decreased breath sounds, wheezing, rhonchi or rales.  Abdominal:  Palpations: Abdomen is soft.     Tenderness: There is no abdominal tenderness.  Musculoskeletal:     Right lower leg: No swelling.     Left lower leg: No swelling.  Skin:    General: Skin is warm.     Findings: No rash.  Neurological:     Mental Status: She is alert.     Comments: Answers simple questions appropriately.      Data Reviewed: Blood cultures positive for E. coli, sodium 134, procalcitonin 12.8, white blood cell count 9.5  Family Communication: Updated patient's daughter on the phone  Disposition: Status is:  Inpatient Remains inpatient appropriate because: We do not have a safe disposition.  Now positive for E. coli bacteremia on IV Rocephin.  Planned Discharge Destination: Possibly memory care unit    Time spent: 28 minutes  Author: Alford Highland, MD 05/16/2022 12:52 PM  For on call review [email or web address].

## 2022-05-16 NOTE — Assessment & Plan Note (Addendum)
Resolved secondary to complete treatment.  Fever of 102 on 4/27.  Blood cultures positive for E. Coli secondary to urine.  Completed antibiotic course.

## 2022-05-16 NOTE — Progress Notes (Signed)
Mobility Specialist - Progress Note   05/16/22 0853  Mobility  Activity Ambulated with assistance in hallway;Stood at bedside;Dangled on edge of bed;Ambulated with assistance to bathroom  Level of Assistance Standby assist, set-up cues, supervision of patient - no hands on  Assistive Device None  Distance Ambulated (ft) 320 ft  Activity Response Tolerated well  Mobility Referral Yes  $Mobility charge 1 Mobility   Pt supine in bed on RA upon arrival. Pt STS and ambulates in hallway and to/from bathroom Supervision with no LOB noted. Pt returns to bed with needs in reach and sitter present.   Terrilyn Saver  Mobility Specialist  05/16/22 8:55 AM

## 2022-05-16 NOTE — Plan of Care (Signed)

## 2022-05-17 ENCOUNTER — Inpatient Hospital Stay: Payer: Medicare Other

## 2022-05-17 DIAGNOSIS — I10 Essential (primary) hypertension: Secondary | ICD-10-CM | POA: Diagnosis not present

## 2022-05-17 DIAGNOSIS — E871 Hypo-osmolality and hyponatremia: Secondary | ICD-10-CM

## 2022-05-17 DIAGNOSIS — F01B18 Vascular dementia, moderate, with other behavioral disturbance: Secondary | ICD-10-CM | POA: Diagnosis not present

## 2022-05-17 DIAGNOSIS — Z8673 Personal history of transient ischemic attack (TIA), and cerebral infarction without residual deficits: Secondary | ICD-10-CM | POA: Diagnosis not present

## 2022-05-17 DIAGNOSIS — N200 Calculus of kidney: Secondary | ICD-10-CM | POA: Diagnosis not present

## 2022-05-17 DIAGNOSIS — R7881 Bacteremia: Secondary | ICD-10-CM | POA: Diagnosis not present

## 2022-05-17 LAB — URINE CULTURE: Culture: NO GROWTH

## 2022-05-17 LAB — CULTURE, BLOOD (ROUTINE X 2): Special Requests: ADEQUATE

## 2022-05-17 NOTE — TOC Progression Note (Addendum)
Transition of Care [facility]) - Progression Note    Patient Details  Name: Madeline Taylor MRN: [id number] Date of Birth: 12/24/1935  Transition of Care [facility]) CM/SW Contact  Marlowe Sax, RN Phone Number: 05/17/2022, 1:42 PM  Clinical Narrative:     Derrance H from [employer] called and stated to provide the aptient with a [employer] number they will need a note from provider showing that Dementia is her primary mental health diagnosis Sent secure email to [email or web address]    Expected Discharge Plan: Long Term Nursing Home Barriers to Discharge: No SNF bed  Expected Discharge Plan and Services       Living arrangements for the past 2 months: Single Family Home                 DME Arranged: Walker rolling DME Agency: [employer] Date DME Agency Contacted: 04/25/22 Time DME Agency Contacted: [phone] Representative spoke with at DME Agency: [name]             Social Determinants of Health (SDOH) Interventions SDOH Screenings   Food Insecurity: Patient Unable To Answer (04/23/2022)  Housing: Low Risk  (04/23/2022)  Transportation Needs: No Transportation Needs (04/23/2022)  Utilities: Not At Risk (04/23/2022)  Alcohol Screen: Low Risk  (05/21/2021)  Depression (PHQ2-9): Low Risk  (05/21/2021)  Financial Resource Strain: Low Risk  (05/21/2021)  Physical Activity: Inactive (05/21/2021)  Social Connections: Moderately Integrated (05/21/2021)  Stress: No Stress Concern Present (05/21/2021)  Tobacco Use: Low Risk  (04/23/2022)    Readmission Risk Interventions     No data to display

## 2022-05-17 NOTE — Plan of Care (Signed)

## 2022-05-17 NOTE — Progress Notes (Signed)
Occupational Therapy Treatment Patient Details Name: Madeline Taylor MRN: [id number] DOB: 28-Nov-1935 Today's Date: 05/17/2022   History of present illness Pt is an 87 y/o F admitted on 04/22/22 after presenting with encephalopathy & hypothermia. Pt had COVID about a week ago with worsening behavior since then, as well as sleep deprivation. PMH: dementia, HTN, TIAs   OT comments  Pt seen for OT tx. Pt in bed, agreeable to session, sitter in room. Pt completed bed mobility independently. Pt negotiated nursing unit x4 laps while engaging in situational safety activity and dual tasking. Pt able to appropriately respond to situational questions ~80%. Tends to slow down when dual tasking, unable to maintain speed. Continue with POC.    Recommendations for follow up therapy are one component of a multi-disciplinary discharge planning process, led by the attending physician.  Recommendations may be updated based on patient status, additional functional criteria and insurance authorization.    Assistance Recommended at Discharge Frequent or constant Supervision/Assistance  Patient can return home with the following  A little help with walking and/or transfers;A little help with bathing/dressing/bathroom;Assistance with cooking/housework;Direct supervision/assist for medications management;Direct supervision/assist for financial management;Assist for transportation;Help with stairs or ramp for entrance   Equipment Recommendations  None recommended by OT    Recommendations for Other Services      Precautions / Restrictions Precautions Precautions: Fall Restrictions Weight Bearing Restrictions: No       Mobility Bed Mobility Overal bed mobility: Independent                  Transfers Overall transfer level: Independent Equipment used: None Transfers: Sit to/from Stand                   Balance Overall balance assessment: Needs assistance Sitting-balance support: Feet  supported Sitting balance-Leahy Scale: Normal     Standing balance support: No upper extremity supported Standing balance-Leahy Scale: Good                             ADL either performed or assessed with clinical judgement   ADL                                              Extremity/Trunk Assessment              Vision       Perception     Praxis      Cognition Arousal/Alertness: Awake/alert Behavior During Therapy: WFL for tasks assessed/performed Overall Cognitive Status: History of cognitive impairments - at baseline                                 General Comments: Pleasant, redirectable, appropriate responses during situational safety questioning        Exercises Other Exercises Other Exercises: Pt negotiated nursing unit x4 laps while engaging in situational safety activity and dual tasking. Pt able to appropriately respond to situational questions ~80%. Tends to slow down when dual tasking    Shoulder Instructions       General Comments      Pertinent Vitals/ Pain       Pain Assessment Pain Assessment: No/denies pain  Home Living  Prior Functioning/Environment              Frequency  Min 1X/week        Progress Toward Goals  OT Goals(current goals can now be found in the care plan section)  Progress towards OT goals: Progressing toward goals  Acute Rehab OT Goals Patient Stated Goal: get better OT Goal Formulation: With patient Time For Goal Achievement: 05/25/22 Potential to Achieve Goals: Good  Plan Discharge plan remains appropriate;Frequency remains appropriate    Co-evaluation                 AM-PAC OT "6 Clicks" Daily Activity     Outcome Measure   Help from another person eating meals?: None Help from another person taking care of personal grooming?: A Little Help from another person toileting, which  includes using toliet, bedpan, or urinal?: A Little Help from another person bathing (including washing, rinsing, drying)?: A Little Help from another person to put on and taking off regular upper body clothing?: None Help from another person to put on and taking off regular lower body clothing?: A Little 6 Click Score: 20    End of Session Equipment Utilized During Treatment: Gait belt  OT Visit Diagnosis: Unsteadiness on feet (R26.81)   Activity Tolerance Patient tolerated treatment well   Patient Left in bed;with call bell/phone within reach;with bed alarm set;with nursing/sitter in room   Nurse Communication          Time: 1610-9604 OT Time Calculation (min): 18 min  Charges: OT General Charges $OT Visit: 1 Visit OT Treatments $Therapeutic Activity: 8-22 mins  Arman Filter., MPH, MS, OTR/L ascom [phone] 05/17/22, 1:08 PM

## 2022-05-17 NOTE — Progress Notes (Signed)
Progress Note   Patient: Madeline Taylor:[id number] DOB: Sep 26, 1935 DOA: 04/22/2022     24 DOS: the patient was seen and examined on 05/17/2022   Brief hospital course: 87 y.o. female with medical history significant of dementia, hypertension, history of TIAs presenting with encephalopathy, hypothermia  Per daughter, patient had worsening dementia, frequently Lost walking out of the house.  But normally has good appetite.  For the last week, patient had significant increased agitation, has not been sleeping well. She also had a positive COVID about a week ago, repeat COVID was negative at admission. Patient is seen by PT/OT, no need for nursing home placement.  However unsafe discharge.  The patient has vascular dementia and does not have the ability to make medical decisions or sign paperwork for herself at this time.  Evening of 4/16, patient had a fall out of bed and has bruising left frontal and around the left eye.  4/24.  Medically stable for disposition.  Patient has been calm and cooperative during the hospital stay. 4/27 in the evening spiked a fever of 102.  Blood cultures positive for E. coli and Started empirically on antibiotics and IV fluids.  Will need a few days of IV antibiotics until sensitivities back.  Likely urine source with urinalysis positive.  Urine culture still pending. 4/28.  Continue Rocephin for E. coli bacteremia 4/28.  Urine culture negative but this was sent after antibiotics were started.  Urine analysis was positive.  Still suspect urine source.  Assessment and Plan: * Bacteremia due to Escherichia coli Fever of 102 on 4/27.  Blood cultures positive for E. coli.  Likely urine source with urinalysis positive.  Started empirically on Rocephin on 4/27.   Urine culture resulted as negative but antibiotics were started before urine culture sent.  Urine analysis was positive.  Still suspect a urine source.  Will get CT abdomen to make sure I am not missing anything  else.  Vascular dementia (HCC) Dementia with behavioral disturbance.  On Seroquel at night.  Risperdal as needed during the day only if needed.  Patient calm and cooperative and answers questions.  Essential hypertension On amlodipine and losartan   History of TIA (transient ischemic attack) MRI brain was negative for acute intracranial abnormality.  Hypothermia Resolved.  Hyponatremia Sodium 1 point less than normal range.  Facial bruising, sequela Bruising around the left eye upper and lower lids and left frontal hematoma  Metabolic acidosis Improved        Subjective: Patient feels okay.  Offers no complaints.  Initially admitted with vascular dementia.  Developed E. coli bacteremia during the hospital course.  Physical Exam: Vitals:   05/16/22 1007 05/16/22 1552 05/16/22 2317 05/17/22 0820  BP: 105/85 (!) 141/63 122/61 (!) 121/59  Pulse: 64 80 74 78  Resp:  17 16 18   Temp:  99.5 F (37.5 C) 98.9 F (37.2 C) 98.7 F (37.1 C)  TempSrc:   Oral   SpO2: 98% 99% 98% 97%  Weight:      Height:       Physical Exam HENT:     Head:     Comments: Left frontal hematoma    Mouth/Throat:     Pharynx: No oropharyngeal exudate.  Eyes:     Conjunctiva/sclera: Conjunctivae normal.     Comments: Bruising around left upper and lower eyelid  Cardiovascular:     Rate and Rhythm: Normal rate and regular rhythm.     Heart sounds: Normal heart sounds, S1 normal  and S2 normal.  Pulmonary:     Breath sounds: No decreased breath sounds, wheezing, rhonchi or rales.  Abdominal:     Palpations: Abdomen is soft.     Tenderness: There is no abdominal tenderness.  Musculoskeletal:     Right lower leg: No swelling.     Left lower leg: No swelling.  Skin:    General: Skin is warm.     Findings: No rash.  Neurological:     Mental Status: She is alert.     Comments: Answers simple questions appropriately.      Data Reviewed: Blood cultures positive for E. coli, urine analysis  was positive but urine culture so far no growth but this was done after antibiotics were started. Family Communication: Updated daughter on the phone yesterday.  Disposition: Status is: Inpatient Remains inpatient appropriate because: Treating bacteremia E. coli with IV antibiotics.  Planned Discharge Destination: Likely group home    Time spent: 28 minutes  Author: Alford Highland, MD 05/17/2022 2:22 PM  For on call review [email or web address].

## 2022-05-17 NOTE — Progress Notes (Signed)
Physical Therapy Treatment Patient Details Name: Madeline Taylor MRN: [id number] DOB: Nov 24, 1935 Today's Date: 05/17/2022   History of Present Illness Pt is an 87 y/o F admitted on 04/22/22 after presenting with encephalopathy & hypothermia. Pt had COVID about a week ago with worsening behavior since then, as well as sleep deprivation. PMH: dementia, HTN, TIAs    PT Comments    Pt was pleasant and motivated to participate during the session and put forth good effort throughout. Pt steady with transfers and gait including during dynamic gait training with head turns and start/stops.  Pt was able to ascend and descend 4 steps x 2 without rails but presented with min instability including requiring min A on two occasions to prevent LOB, both while ascending the stairs.  Pt will require 24/7 supervision at discharge secondary to cognitive deficits but will not require skilled PT services.      Recommendations for follow up therapy are one component of a multi-disciplinary discharge planning process, led by the attending physician.  Recommendations may be updated based on patient status, additional functional criteria and insurance authorization.  Follow Up Recommendations  Can patient physically be transported by private vehicle: Yes    Assistance Recommended at Discharge Frequent or constant Supervision/Assistance  Patient can return home with the following A little help with walking and/or transfers;A little help with bathing/dressing/bathroom;Assistance with cooking/housework;Assist for transportation;Help with stairs or ramp for entrance;Direct supervision/assist for medications management;Direct supervision/assist for financial management   Equipment Recommendations  None recommended by PT    Recommendations for Other Services       Precautions / Restrictions Precautions Precautions: Fall Restrictions Weight Bearing Restrictions: No     Mobility  Bed Mobility                General bed mobility comments: NT, pt received sitting EOB    Transfers Overall transfer level: Independent Equipment used: None Transfers: Sit to/from Stand Sit to Stand: Independent           General transfer comment: Good speed and effort as well as eccentric and concentric control and stability    Ambulation/Gait Ambulation/Gait assistance: Supervision Gait Distance (Feet): 600 Feet Assistive device: None Gait Pattern/deviations: WFL(Within Functional Limits) Gait velocity: min decreased     General Gait Details: Pt steady with amb with only mild drifting left/right with head turns   Stairs Stairs: Yes Stairs assistance: Min assist Stair Management: Alternating pattern, Forwards, No rails Number of Stairs: 8 General stair comments: Pt able to ascend and descend 4 steps x 2 without rails with min A on two occasions to prevent LOB   Wheelchair Mobility    Modified Rankin (Stroke Patients Only)       Balance Overall balance assessment: Needs assistance Sitting-balance support: Feet supported Sitting balance-Leahy Scale: Normal     Standing balance support: No upper extremity supported Standing balance-Leahy Scale: Good                              Cognition Arousal/Alertness: Awake/alert Behavior During Therapy: WFL for tasks assessed/performed Overall Cognitive Status: History of cognitive impairments - at baseline                                          Exercises Other Exercises Other Exercises: Static standing balance training with combinations of eyes open/closed  and head still/head turns with feet together and semi-tandem Other Exercises: Dynamic standing balance training with reaching outside BOS with feet together, semi-tandem, and tandem    General Comments        Pertinent Vitals/Pain Pain Assessment Pain Assessment: No/denies pain    Home Living                          Prior Function             PT Goals (current goals can now be found in the care plan section) Progress towards PT goals: Progressing toward goals    Frequency    Min 2X/week      PT Plan Current plan remains appropriate    Co-evaluation              AM-PAC PT "6 Clicks" Mobility   Outcome Measure  Help needed turning from your back to your side while in a flat bed without using bedrails?: None Help needed moving from lying on your back to sitting on the side of a flat bed without using bedrails?: None Help needed moving to and from a bed to a chair (including a wheelchair)?: None Help needed standing up from a chair using your arms (e.g., wheelchair or bedside chair)?: None Help needed to walk in hospital room?: A Little Help needed climbing 3-5 steps with a railing? : A Little 6 Click Score: 22    End of Session Equipment Utilized During Treatment: Gait belt Activity Tolerance: Patient tolerated treatment well Patient left: in chair;Other (comment) (tele sitter in place) Nurse Communication: Mobility status (pt left in chair for dinner) PT Visit Diagnosis: Muscle weakness (generalized) (M62.81);Unsteadiness on feet (R26.81)     Time: 1610-9604 PT Time Calculation (min) (ACUTE ONLY): 25 min  Charges:  $Gait Training: 8-22 mins $Therapeutic Exercise: 8-22 mins                    D. Scott Emelie Newsom PT, DPT 05/17/22, 5:22 PM

## 2022-05-17 NOTE — Progress Notes (Signed)
Vascular dementia is the patient's primary mental health disorder.  Dr. Alford Highland

## 2022-05-18 ENCOUNTER — Inpatient Hospital Stay: Payer: Medicare Other

## 2022-05-18 DIAGNOSIS — R7881 Bacteremia: Secondary | ICD-10-CM | POA: Diagnosis not present

## 2022-05-18 DIAGNOSIS — F01B18 Vascular dementia, moderate, with other behavioral disturbance: Secondary | ICD-10-CM | POA: Diagnosis not present

## 2022-05-18 DIAGNOSIS — Z8673 Personal history of transient ischemic attack (TIA), and cerebral infarction without residual deficits: Secondary | ICD-10-CM | POA: Diagnosis not present

## 2022-05-18 DIAGNOSIS — K863 Pseudocyst of pancreas: Secondary | ICD-10-CM

## 2022-05-18 DIAGNOSIS — K769 Liver disease, unspecified: Secondary | ICD-10-CM | POA: Diagnosis not present

## 2022-05-18 LAB — CBC
HCT: 34 % — ABNORMAL LOW (ref 36.0–46.0)
Hemoglobin: 11.2 g/dL — ABNORMAL LOW (ref 12.0–15.0)
MCH: 29.9 pg (ref 26.0–34.0)
MCHC: 32.9 g/dL (ref 30.0–36.0)
MCV: 90.7 fL (ref 80.0–100.0)
Platelets: 200 10*3/uL (ref 150–400)
RBC: 3.75 MIL/uL — ABNORMAL LOW (ref 3.87–5.11)
RDW: 12.9 % (ref 11.5–15.5)
WBC: 6.1 10*3/uL (ref 4.0–10.5)
nRBC: 0 % (ref 0.0–0.2)

## 2022-05-18 LAB — CULTURE, BLOOD (ROUTINE X 2)

## 2022-05-18 LAB — BASIC METABOLIC PANEL
Anion gap: 6 (ref 5–15)
BUN: 12 mg/dL (ref 8–23)
CO2: 23 mmol/L (ref 22–32)
Calcium: 8.5 mg/dL — ABNORMAL LOW (ref 8.9–10.3)
Chloride: 108 mmol/L (ref 98–111)
Creatinine, Ser: 0.76 mg/dL (ref 0.44–1.00)
GFR, Estimated: >60 mL/min (ref >60)
Glucose, Bld: 102 mg/dL — ABNORMAL HIGH (ref 70–99)
Potassium: 3.6 mmol/L (ref 3.5–5.1)
Sodium: 137 mmol/L (ref 135–145)

## 2022-05-18 MED ORDER — CEFAZOLIN SODIUM-DEXTROSE 2-4 GM/100ML-% IV SOLN
2.0000 g | Freq: Three times a day (TID) | INTRAVENOUS | Status: AC
  Administered 2022-05-18 – 2022-05-19 (×4): 2 g via INTRAVENOUS
  Filled 2022-05-18 (×4): qty 100

## 2022-05-18 MED ORDER — IOHEXOL 350 MG/ML SOLN: 100.0000 mL | Freq: Once | INTRAVENOUS | Status: DC | PRN

## 2022-05-18 MED ORDER — IOHEXOL 300 MG/ML  SOLN
100.0000 mL | Freq: Once | INTRAMUSCULAR | Status: AC | PRN
  Administered 2022-05-18: 100 mL via INTRAVENOUS

## 2022-05-18 NOTE — Progress Notes (Signed)
Physical Therapy Treatment Patient Details Name: Madeline Taylor MRN: [id number] DOB: [birth year] Today's Date: 05/18/2022   History of Present Illness Pt is an 87 y/o F admitted on 04/22/22 after presenting with encephalopathy & hypothermia. Pt had COVID about a week ago with worsening behavior since then, as well as sleep deprivation. PMH: dementia, HTN, TIAs    PT Comments    Pt seen for PT tx with pt agreeable, asking to walk. Pt ambulates 2 laps around nurses station & to rehab gym & back without AD with supervision fade to mod I without AD with slightly decreased gait speed. Pt negotiates 4 steps with B rails & supervision, 4 steps without rails with CGA. Pt is making little to no progress with functional mobility & has remained at supervision<>mod I for gait without AD & CGA<>Min assist for stair negotiation. Due to progress, PT to d/c pt from acute PT services & team notified. Recommend pt continue to mobilize with mobility specialists & nursing staff while in acute setting.    Recommendations for follow up therapy are one component of a multi-disciplinary discharge planning process, led by the attending physician.  Recommendations may be updated based on patient status, additional functional criteria and insurance authorization.  Follow Up Recommendations  Can patient physically be transported by private vehicle: Yes    Assistance Recommended at Discharge Frequent or constant Supervision/Assistance (2/2 cognitive deficits)  Patient can return home with the following Assistance with cooking/housework;Direct supervision/assist for medications management;Direct supervision/assist for financial management;Help with stairs or ramp for entrance   Equipment Recommendations  None recommended by PT    Recommendations for Other Services       Precautions / Restrictions Precautions Precautions: Fall Restrictions Weight Bearing Restrictions: No     Mobility  Bed Mobility                General bed mobility comments: not tested, pt received standing in room, left sitting in chair    Transfers Overall transfer level: Independent Equipment used: None Transfers: Sit to/from Stand Sit to Stand: Independent                Ambulation/Gait Ambulation/Gait assistance: Modified independent (Device/Increase time) Gait Distance (Feet):  (>200 ft) Assistive device: None Gait Pattern/deviations: Wide base of support, Decreased dorsiflexion - right, Decreased dorsiflexion - left Gait velocity: slightly decreased     General Gait Details: only 1 slight LOB posteriorly but pt able to correct without assistance   Stairs Stairs: Yes Stairs assistance: Supervision, Min guard Stair Management: Two rails, No rails, Step to pattern Number of Stairs: 4 (+ 4) General stair comments: Pt negotiates 4 steps (6") with B rails & supervision, negotiates 4 steps without rails with CGA.   Wheelchair Mobility    Modified Rankin (Stroke Patients Only)       Balance Overall balance assessment: Needs assistance Sitting-balance support: Feet supported Sitting balance-Leahy Scale: Normal     Standing balance support: No upper extremity supported Standing balance-Leahy Scale: Good                              Cognition Arousal/Alertness: Awake/alert Behavior During Therapy: WFL for tasks assessed/performed Overall Cognitive Status: History of cognitive impairments - at baseline                                 General Comments: Pleasant, redirectable,  motivated to participate.        Exercises      General Comments        Pertinent Vitals/Pain Pain Assessment Pain Assessment: No/denies pain    Home Living                          Prior Function            PT Goals (current goals can now be found in the care plan section) Acute Rehab PT Goals Patient Stated Goal: none stated PT Goal Formulation: With patient Time  For Goal Achievement: 05/08/22 Potential to Achieve Goals: Fair Progress towards PT goals: Goals met/education completed, patient discharged from PT    Frequency           PT Plan Current plan remains appropriate    Co-evaluation              AM-PAC PT "6 Clicks" Mobility   Outcome Measure  Help needed turning from your back to your side while in a flat bed without using bedrails?: None Help needed moving from lying on your back to sitting on the side of a flat bed without using bedrails?: None Help needed moving to and from a bed to a chair (including a wheelchair)?: None Help needed standing up from a chair using your arms (e.g., wheelchair or bedside chair)?: None Help needed to walk in hospital room?: None Help needed climbing 3-5 steps with a railing? : A Little 6 Click Score: 23    End of Session   Activity Tolerance: Patient tolerated treatment well Patient left: in chair   PT Visit Diagnosis: Muscle weakness (generalized) (M62.81);Unsteadiness on feet (R26.81)     Time: 1324-4010 PT Time Calculation (min) (ACUTE ONLY): 9 min  Charges:  $Therapeutic Activity: 8-22 mins                     Aleda Grana, PT, DPT 05/18/22, 2:41 PM   Sandi Mariscal 05/18/2022, 2:39 PM

## 2022-05-18 NOTE — Assessment & Plan Note (Addendum)
Seen on CT scan of the abdomen.  Tumor markers negative.

## 2022-05-18 NOTE — Plan of Care (Signed)

## 2022-05-18 NOTE — Plan of Care (Signed)
Patient A&Ox1, from home, independent in room and ambulates in hallway. Patient appears restless, but denies pain for this RN. Patient takes medications whole without difficulty.

## 2022-05-18 NOTE — TOC Progression Note (Signed)
Transition of Care [facility]) - Progression Note    Patient Details  Name: Madeline Taylor MRN: [id number] Date of Birth: 03/10/1935  Transition of Care [facility]) CM/SW Contact  Darleene Cleaver, [place] Phone Number: 05/18/2022, 3:41 PM  Clinical Narrative:    CSW sent updated clinicals to SNFs and also contacted Rudean Curt from [facility] to see if she has any placement options for patient.    Patient's [place] number has also been received back it is [phone].  CSW received a phone call from [facility] APS, patient has an APS worker following her.  The APS worker is Nancy Fetter, office number is [phone], work cell phone number is [phone].  She is requesting to be updated on patient's progress throughout discharge planning.  Patient has been approved for Medicaid, the number is [id number] Q.  TOC to continue to follow patient's progress throughout discharge planning.  Expected Discharge Plan: Long Term Nursing Home Barriers to Discharge: No SNF bed  Expected Discharge Plan and Services  SNF or memory care.     Living arrangements for the past 2 months: Single Family Home                 DME Arranged: Walker rolling DME Agency: [employer] Date DME Agency Contacted: 04/25/22 Time DME Agency Contacted: [phone] Representative spoke with at DME Agency: [name]             Social Determinants of Health (SDOH) Interventions SDOH Screenings   Food Insecurity: Patient Unable To Answer (04/23/2022)  Housing: Low Risk  (04/23/2022)  Transportation Needs: No Transportation Needs (04/23/2022)  Utilities: Not At Risk (04/23/2022)  Alcohol Screen: Low Risk  (05/21/2021)  Depression (PHQ2-9): Low Risk  (05/21/2021)  Financial Resource Strain: Low Risk  (05/21/2021)  Physical Activity: Inactive (05/21/2021)  Social Connections: Moderately Integrated (05/21/2021)  Stress: No Stress Concern Present (05/21/2021)  Tobacco Use: Low Risk  (04/23/2022)    Readmission Risk Interventions     No  data to display

## 2022-05-18 NOTE — Plan of Care (Signed)
  Problem: Education: Goal: Knowledge of General Education information will improve Description: Including pain rating scale, medication(s)/side effects and non-pharmacologic comfort measures Outcome: Progressing   Problem: Clinical Measurements: Goal: Ability to maintain clinical measurements within normal limits will improve Outcome: Progressing Goal: Cardiovascular complication will be avoided Outcome: Progressing   Problem: Nutrition: Goal: Adequate nutrition will be maintained Outcome: Progressing   Problem: Coping: Goal: Level of anxiety will decrease Outcome: Progressing   Problem: Elimination: Goal: Will not experience complications related to bowel motility Outcome: Progressing   Problem: Pain Managment: Goal: General experience of comfort will improve Outcome: Progressing   Problem: Safety: Goal: Ability to remain free from injury will improve Outcome: Progressing

## 2022-05-18 NOTE — Progress Notes (Signed)
Progress Note   Patient: Madeline Taylor:[id number] DOB: 11-Nov-1935 DOA: 04/22/2022     25 DOS: the patient was seen and examined on 05/18/2022   Brief hospital course: 87 y.o. female with medical history significant of dementia, hypertension, history of TIAs presenting with encephalopathy, hypothermia  Per daughter, patient had worsening dementia, frequently Lost walking out of the house.  But normally has good appetite.  For the last week, patient had significant increased agitation, has not been sleeping well. She also had a positive COVID about a week ago, repeat COVID was negative at admission. Patient is seen by PT/OT, no need for nursing home placement.  However unsafe discharge.  The patient has vascular dementia and does not have the ability to make medical decisions or sign paperwork for herself at this time.  Evening of 4/16, patient had a fall out of bed and has bruising left frontal and around the left eye.  4/24.  Medically stable for disposition.  Patient has been calm and cooperative during the hospital stay. 4/27 in the evening spiked a fever of 102.  Blood cultures positive for E. coli and Started empirically on antibiotics and IV fluids.  Will need a few days of IV antibiotics until sensitivities back.  Likely urine source with urinalysis positive.  Urine culture still pending. 4/28.  Continue Rocephin for E. coli bacteremia 4/29.  Urine culture negative but this was sent after antibiotics were started.  Urine analysis was positive.  Still suspect urine source.  CT renal stone protocol showed pancreatic pseudocyst. 4/30 will get pancreatic CT with contrast for further evaluation.  Send off a CA 19-9 and CA125.  Rocephin switched over to Ancef for E. coli bacteremia.  Still suspect a urine source.  Assessment and Plan: * Bacteremia due to Escherichia coli Fever of 102 on 4/27.  Blood cultures positive for E. coli.  Likely urine source with urinalysis positive.  Started  empirically on Rocephin on 4/27.   Urine culture resulted as negative but urine culture sent on 4/28 and antibiotics were started on 4/27.  Urine analysis was positive.  Still suspect a urine source.  With sensitivities back on E. coli will switch over to cefazolin.  Can likely switch over to high-dose amoxicillin within the next day or so.  Pancreatic pseudocyst Seen on CT scan of the abdomen without contrast.  Will get a CT scan pancreatic protocol.  Send off a CA 19-9 and CA125.  Vascular dementia (HCC) Dementia with behavioral disturbance.  On Seroquel at night.  Risperdal as needed during the day only if needed.  Patient calm and cooperative and answers questions.  Patient has not been wandering.  Sleeping well with Seroquel at night.  Has not had any behavioral disturbance during the past week of rounding or on my prior week of rounding.  Essential hypertension On amlodipine and losartan   History of TIA (transient ischemic attack) MRI brain was negative for acute intracranial abnormality.  Hypothermia Resolved.  Hyponatremia Sodium 1 point less than normal range.  Facial bruising, sequela Bruising around the left eye upper and lower lids and left frontal hematoma  Metabolic acidosis Improved        Subjective: Patient feels fine.  Offers no complaints.  Discussed with daughter about pseudocyst on CT scan.  Will get CT scan pancreatic protocol for further evaluation.  Admitted initially with altered mental status.  Developed E. coli bacteremia.  Physical Exam: Vitals:   05/17/22 9562 05/17/22 1611 05/17/22 2301 05/18/22 1308  BP: (!) 121/59 (!) 148/66 134/62 126/63  Pulse: 78 77 61 66  Resp: 18 17 20 16   Temp: 98.7 F (37.1 C) 98.4 F (36.9 C) 98.4 F (36.9 C) 98.3 F (36.8 C)  TempSrc:      SpO2: 97% 100% 98% 97%  Weight:      Height:       Physical Exam HENT:     Head:     Comments: Left frontal hematoma    Mouth/Throat:     Pharynx: No oropharyngeal  exudate.  Eyes:     Conjunctiva/sclera: Conjunctivae normal.     Comments: Bruising around left upper and lower eyelid  Cardiovascular:     Rate and Rhythm: Normal rate and regular rhythm.     Heart sounds: Normal heart sounds, S1 normal and S2 normal.  Pulmonary:     Breath sounds: No decreased breath sounds, wheezing, rhonchi or rales.  Abdominal:     Palpations: Abdomen is soft.     Tenderness: There is no abdominal tenderness.  Musculoskeletal:     Right lower leg: No swelling.     Left lower leg: No swelling.  Skin:    General: Skin is warm.     Findings: No rash.  Neurological:     Mental Status: She is alert.     Comments: Answers simple questions appropriately.      Data Reviewed: Creatinine 0.76, hemoglobin 11.2 CT scan of the abdomen shows a probable residual complex fluid collection/pseudocyst abutting the tail of the pancreas measuring 4.0 x 2.2 x 3.5 cm Family Communication: Updated patient's daughter on the phone  Disposition: Status is: Inpatient Remains inpatient appropriate because: We do not have a safe disposition  Planned Discharge Destination: TOC working on options    Time spent: 28 minutes Case discussed with ID pharmacist.  Author: Alford Highland, MD 05/18/2022 2:32 PM  For on call review [email or web address].

## 2022-05-18 NOTE — Progress Notes (Signed)
Mobility Specialist - Progress Note   05/18/22 1000  Mobility  Activity Ambulated with assistance in hallway  Level of Assistance Standby assist, set-up cues, supervision of patient - no hands on  Assistive Device None  Distance Ambulated (ft) 480 ft  Activity Response Tolerated well  $Mobility charge 1 Mobility   Pt sitting EOB upon entry, utilizing RA. Pt completed STS indep and amb with supervision. During gait, Pt expressed a tendency to veer to the left side of the hallway-- slowly returning to the middle of the hall. Pt amb three laps around the NS, tolerated well. Pt returned to room, left seated EOB with needs within reach.   Madeline Taylor Mobility Specialist 05/18/22 10:05 AM

## 2022-05-19 DIAGNOSIS — R7881 Bacteremia: Secondary | ICD-10-CM

## 2022-05-19 DIAGNOSIS — B9620 Unspecified Escherichia coli [E. coli] as the cause of diseases classified elsewhere: Secondary | ICD-10-CM | POA: Diagnosis not present

## 2022-05-19 DIAGNOSIS — K863 Pseudocyst of pancreas: Secondary | ICD-10-CM

## 2022-05-19 DIAGNOSIS — F0150 Vascular dementia without behavioral disturbance: Secondary | ICD-10-CM | POA: Diagnosis not present

## 2022-05-19 DIAGNOSIS — Z8673 Personal history of transient ischemic attack (TIA), and cerebral infarction without residual deficits: Secondary | ICD-10-CM | POA: Diagnosis not present

## 2022-05-19 LAB — CBC
HCT: 34.7 % — ABNORMAL LOW (ref 36.0–46.0)
Hemoglobin: 11.4 g/dL — ABNORMAL LOW (ref 12.0–15.0)
MCH: 29.7 pg (ref 26.0–34.0)
MCHC: 32.9 g/dL (ref 30.0–36.0)
MCV: 90.4 fL (ref 80.0–100.0)
Platelets: 252 10*3/uL (ref 150–400)
RBC: 3.84 MIL/uL — ABNORMAL LOW (ref 3.87–5.11)
RDW: 12.7 % (ref 11.5–15.5)
WBC: 5.4 10*3/uL (ref 4.0–10.5)
nRBC: 0 % (ref 0.0–0.2)

## 2022-05-19 MED ORDER — AMOXICILLIN 500 MG PO CAPS
1000.0000 mg | ORAL_CAPSULE | Freq: Three times a day (TID) | ORAL | Status: AC
  Administered 2022-05-20 – 2022-05-21 (×6): 1000 mg via ORAL
  Filled 2022-05-19 (×6): qty 2

## 2022-05-19 NOTE — TOC Progression Note (Addendum)
Transition of Care [facility]) - Progression Note    Patient Details  Name: Madeline Taylor MRN: [id number] Date of Birth: [birth year]  Transition of Care [facility]) CM/SW Contact  Darleene Cleaver, [place] Phone Number: 05/19/2022, 2:38 PM  Clinical Narrative:     9:40am  CSW spoke to bedside nurse regarding patient's agitation.  Per bedside nurse patient does get physically aggressive with agitation, just sometimes just talking to nurse's and saying you can't tell me what to do.  No physical aggressions or inappropriate behaviors.  Patient is compliant with medications, and pleasantly confused most of the time.  CSW updated [facility] at [facility] and [place] [facility].  CSW spoke with [facility], they can accept patient if family agreeable.  CSW contacted patient's daughter [name], [phone] and provided bed offers for patient.  She is going to discuss with the family and call CSW back with a decision. TOC to continue to follow patient's progress throughout discharge planning.  Baird Lyons at [facility] also has a bed available in [place] at [facility] if family is interested.  CSW will ask family once they call back.  5:10pm  CSW spoke to patient's daughter [name], she has discussed with patient's other family members they feel that [facility] is too far away.  She is requesting that CSW check with the local facilities again in particular [facility], because that is the preferred facility.  CSW informed daughter that this Clinical research associate will check with local facilities again, and a few ALFs to see if they can take a Medicaid Special Assistance patient.  CSW informed daughter that most facilities have a very limited amount of Medicaid beds.  Daughter expressed understanding.  CSW to follow up tomorrow.  Patient has not had any violence or physical aggression since medications have been adjusted.  Patient is compliant with taking her meds.  Expected Discharge Plan: Long Term Nursing  Home Barriers to Discharge: No SNF bed  Expected Discharge Plan and Services  SNF verse memory care ALF.     Living arrangements for the past 2 months: Single Family Home                 DME Arranged: Walker rolling DME Agency: [employer] Date DME Agency Contacted: 04/25/22 Time DME Agency Contacted: [phone] Representative spoke with at DME Agency: [name]             Social Determinants of Health (SDOH) Interventions SDOH Screenings   Food Insecurity: Patient Unable To Answer (04/23/2022)  Housing: Low Risk  (04/23/2022)  Transportation Needs: No Transportation Needs (04/23/2022)  Utilities: Not At Risk (04/23/2022)  Alcohol Screen: Low Risk  (05/21/2021)  Depression (PHQ2-9): Low Risk  (05/21/2021)  Financial Resource Strain: Low Risk  (05/21/2021)  Physical Activity: Inactive (05/21/2021)  Social Connections: Moderately Integrated (05/21/2021)  Stress: No Stress Concern Present (05/21/2021)  Tobacco Use: Low Risk  (04/23/2022)    Readmission Risk Interventions     No data to display

## 2022-05-19 NOTE — Progress Notes (Signed)
[facility]  - [place] at [facility]   PATIENT NAME: Madeline Taylor    MR#:  [id number]  DATE OF BIRTH:  07/28/1935  SUBJECTIVE:   No family at bedside. Patient is quite pleasant watching television. Had a good conversation with me. Per staff appears very calm and pleasant. Ambulates very well.   VITALS:  Blood pressure (!) 129/57, pulse 64, temperature 97.9 F (36.6 C), resp. rate 18, height 4\' 11"  (1.499 m), weight 49.4 kg, SpO2 99 %.  PHYSICAL EXAMINATION:   GENERAL:  87 y.o.-year-old patient with no acute distress.  LUNGS: Normal breath sounds bilaterally, no wheezing CARDIOVASCULAR: S1, S2 normal. No murmur   ABDOMEN: Soft, nontender, nondistended. Bowel sounds present.  EXTREMITIES: No  edema b/l.    NEUROLOGIC: nonfocal  patient is alert and awake SKIN: No obvious rash, lesion, or ulcer.   LABORATORY PANEL:  CBC Recent Labs  Lab 05/19/22 0519  WBC 5.4  HGB 11.4*  HCT 34.7*  PLT 252    Chemistries  Recent Labs  Lab 05/18/22 0512  NA 137  K 3.6  CL 108  CO2 23  GLUCOSE 102*  BUN 12  CREATININE 0.76  CALCIUM 8.5*   Cardiac Enzymes No results for input(s): "TROPONINI" in the last 168 hours. RADIOLOGY:  CT PANCREAS ABD W/WO  Result Date: 05/18/2022 CLINICAL DATA:  Pancreatic pseudocysts. Evaluate for pancreatic neoplasm. EXAM: CT ABDOMEN WITHOUT AND WITH CONTRAST TECHNIQUE: Multidetector CT imaging of the abdomen was performed following the standard protocol before and following the bolus administration of intravenous contrast. RADIATION DOSE REDUCTION: This exam was performed according to the departmental dose-optimization program which includes automated exposure control, adjustment of the mA and/or kV according to patient size and/or use of iterative reconstruction technique. CONTRAST:  OMNIPAQUE IOHEXOL 300 MG/ML  SOLN COMPARISON:  Noncontrast CT 05/17/2022, CT 03/18/2018 with contrast FINDINGS: Lower chest:  Lung bases are clear.  Hepatobiliary: Calcified lesion in the dome the RIGHT hepatic lobe measuring 7 mm not changed in size from CT 2020 but increased peripheral calcification consistent with benign finding. No biliary duct dilatation. Postcholecystectomy. Common bile duct normal caliber. Pancreas: Well-circumscribed fluid collection at the tail the pancreas with thin enhancing rim and central low-attenuation measures 3.8 x 2.5 cm (image 66/7). No communication with the pancreatic parenchyma or pancreatic duct. Residual soft tissue thickening in the inferior margin of the splenic pancreatic interval (image 58/5) appears post inflammatory. Nodular thickening of the LEFT adrenal gland to 14 mm increased from 12 mm. There is no pancreatic duct dilatation. No lesion within the pancreas suggest neoplasm. Spleen: Normal spleen. Adrenals/urinary tract: Thickened LEFT adrenal gland as above measures 15 mm compared to 12 mm on CT 2020. On recent CT 04/21/2022 thickened gland has low attenuation consistent benign adenoma. There several segments of cortical hypoenhancement of the RIGHT kidney. For example 8 mm focus of cortical hypoenhancement on image 66/7 in the RIGHT kidney. Similar 12 mm lesion on image 72/7. Upper pole hypoperfusion on image 54/7. Normal cortical perfusion to the LEFT kidney. Proximal ureters appear normal. Stomach/Bowel: Stomach normal. Multiple venous collaterals in the gastrosplenic ligament. Duodenum limited view of the bowel unremarkable. Vascular/Lymphatic: Abdominal aorta is normal caliber with atherosclerotic calcification. There is no retroperitoneal or periportal lymphadenopathy. No pelvic lymphadenopathy. Musculoskeletal: No aggressive osseous lesion IMPRESSION: 1. Well-formed pseudocyst at  the tail the pancreas. 2. No evidence of pancreatic neoplasm. 3. Multifocal cortical hypoperfusion of the RIGHT kidney. Differential include multifocal pyelonephritis versus renal infarctions. Recommend correlation  and consider  follow-up CT or MRI to demonstrate resolution. LEFT kidney is normal. 4. Enlargement of the LEFT adrenal gland has low attenuation consistent with adrenal adenoma. Electronically Signed   By: Genevive Bi M.D.   On: 05/18/2022 15:50   CT RENAL STONE STUDY  Result Date: 05/17/2022 CLINICAL DATA:  E coli bacteremia. EXAM: CT ABDOMEN AND PELVIS WITHOUT CONTRAST TECHNIQUE: Multidetector CT imaging of the abdomen and pelvis was performed following the standard protocol without IV contrast. RADIATION DOSE REDUCTION: This exam was performed according to the departmental dose-optimization program which includes automated exposure control, adjustment of the mA and/or kV according to patient size and/or use of iterative reconstruction technique. COMPARISON:  CT of the abdomen and pelvis with contrast on 01/16/2019 FINDINGS: Lower chest: No acute abnormality. Hepatobiliary: Stable calcified granulomata at the dome of the liver. Status post cholecystectomy. No biliary dilatation. Pancreas: Probable residual complex fluid collection/pseudocyst abutting the tail of the pancreas and measuring approximately 4.0 x 2.2 x 3.5 cm. This is in the region of prior pancreatitis seen by CT. Possible neoplasm cannot be entirely excluded without IV contrast administration. There is no evidence to suggest acute pancreatitis currently. No pancreatic ductal dilatation is identified. Spleen: Normal in size. 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 Adrenals/Urinary Tract: Adrenal glands are unremarkable. No hydronephrosis. Punctate nonobstructing calculus in the interpolar left kidney. Bladder is unremarkable. Stomach/Bowel: Bowel shows no evidence of obstruction, ileus, inflammation or lesion. The appendix is normal. No free intraperitoneal air. Vascular/Lymphatic:  Atherosclerosis of the abdominal aorta and iliac arteries without evidence of aneurysm. No lymphadenopathy identified Reproductive: Uterus and bilateral adnexa are unremarkable. Other: No abdominal wall hernia or abnormality. No abdominopelvic ascites. Musculoskeletal: Degenerative disc disease at L4-5 with associated mild anterolisthesis of L4 on L5 of approximately 5 mm. IMPRESSION: 1. Probable residual complex fluid collection/pseudocyst abutting the tail of the pancreas and measuring approximately 4.0 x 2.2 x 3.5 cm. This is in the region of prior pancreatitis seen by CT. Possible neoplasm cannot be entirely excluded without IV contrast administration. There is no evidence to suggest acute pancreatitis currently. Consider follow-up examination with CT of the abdomen utilizing pancreatic protocol with IV contrast. 2. 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 3. Punctate nonobstructing calculus in the interpolar left kidney. 4. Atherosclerosis of the abdominal aorta and iliac arteries without evidence of aneurysm. 5. Degenerative disc disease at L4-5 with associated mild anterolisthesis of L4 on L5 of approximately 5 mm. Aortic Atherosclerosis (ICD10-I70.0). Electronically Signed   By: Irish Lack M.D.   On: 05/17/2022 16:10    Assessment and Plan 87 y.o. female with medical history significant of dementia, hypertension, history of TIAs presenting with encephalopathy, hypothermia  Per daughter, patient had worsening dementia, frequently Lost walking out of the house.  But normally has good appetite.  For the last week, patient had significant increased agitation, has not been sleeping well.  Bacteremia due to Escherichia coli --Fever of 102 on 4/27.   --Blood cultures positive for E. coli.   --Likely urine source with urinalysis positive.  --  Started empirically on Rocephin on 4/27.    --Urine culture resulted as negative but urine culture sent on 4/28 and antibiotics were started on 4/27.   --Urine analysis was positive.  Still suspect a urine source.  With sensitivities back on E. coli will switch over to cefazolin.  Can  switch over to high-dose amoxicillin from tomorrow  Pancreatic pseudocyst Seen on CT scan of the abdomen without contrast.  -CT scan pancreatic protocol shows Pancreatic Pseudocyst. NO evidence of neoplasm --pending CA 19-9 and CA125.   Vascular dementia (HCC) --Dementia with behavioral disturbance.  -- On Seroquel at night.  Risperdal as needed during the day only if needed.   --Patient calm and cooperative and answers questions.  -- Patient has not been wandering.  Sleeping well with Seroquel at night.  Has not had any behavioral disturbance per staff. Quiet pleasant   Essential hypertension --On amlodipine and losartan   History of TIA (transient ischemic attack) -MRI brain was negative for acute intracranial abnormality.   Hypothermia --Resolved.   Hyponatremia --Sodium 1 point less than normal range.   Facial bruising, sequela --Bruising around the left eye upper and lower lids and left frontal hematoma   Metabolic acidosis Improved   overall medically stable. Awaiting TOC for discharge planning.   Family communication : spoke with daughter [name] and discussed CT scan results Consults : CODE STATUS: full DVT Prophylaxis : ambulatory Level of care: Telemetry Medical Status is: Inpatient Remains inpatient appropriate because: patient medically stable. Awaiting TOC to discuss discharge options with daughter    TOTAL TIME TAKING CARE OF THIS PATIENT: 35 minutes.  >50% time spent on counselling and coordination of care  Note: This dictation was prepared with Dragon dictation along with smaller phrase technology. Any transcriptional errors that result from this process are  unintentional.  [name] M.D    [facility]   CC: Primary care physician; [name], NP

## 2022-05-19 NOTE — Progress Notes (Signed)
Occupational Therapy Treatment Patient Details Name: Madeline Taylor MRN: [id number] DOB: 1935/07/03 Today's Date: 05/19/2022   History of present illness Pt is an 87 y/o F admitted on 04/22/22 after presenting with encephalopathy & hypothermia. Pt had COVID about a week ago with worsening behavior since then, as well as sleep deprivation. PMH: dementia, HTN, TIAs   OT comments  Pt received semi-reclined in bed. Appearing alert, pleasant; willing to work with OT on functional mobility (declines ADLs, stating she has already completed them). See flowsheet below for further details of session. Left in bed with all needs in reach.  Patient will benefit from continued OT while in acute care.    Recommendations for follow up therapy are one component of a multi-disciplinary discharge planning process, led by the attending physician.  Recommendations may be updated based on patient status, additional functional criteria and insurance authorization.    Assistance Recommended at Discharge Frequent or constant Supervision/Assistance  Patient can return home with the following  A little help with walking and/or transfers;A little help with bathing/dressing/bathroom;Assistance with cooking/housework;Direct supervision/assist for medications management;Direct supervision/assist for financial management;Assist for transportation;Help with stairs or ramp for entrance   Equipment Recommendations  None recommended by OT    Recommendations for Other Services      Precautions / Restrictions Precautions Precautions: Fall Restrictions Weight Bearing Restrictions: No       Mobility Bed Mobility Overal bed mobility: Independent                  Transfers Overall transfer level: Independent                       Balance                                           ADL either performed or assessed with clinical judgement   ADL                                          General ADL Comments: pt stating that she's already done her ADLs today; declines ADL participation at this time. Is motivated to walk in hallways.    Extremity/Trunk Assessment Upper Extremity Assessment Upper Extremity Assessment: Overall WFL for tasks assessed   Lower Extremity Assessment Lower Extremity Assessment: Overall WFL for tasks assessed        Vision       Perception     Praxis      Cognition Arousal/Alertness: Awake/alert Behavior During Therapy: WFL for tasks assessed/performed Overall Cognitive Status: History of cognitive impairments - at baseline                                 General Comments: Pleasant, redirectable, motivated to participate. Pt able to state how many laps around the unit we've been, when her birthday is (not the year), able to state that a plant at the RN station is one she put there, and able to find her room accurately without cues at end of walking. Makes needs known verbally.        Exercises      Shoulder Instructions       General Comments Pt walked 4 laps around  the unit today, no AD, no loss of balance, moderate pace. Has baseline hip pain that slightly alters gait. Pt mobility total of 6 minutes.    Pertinent Vitals/ Pain       Pain Assessment Pain Assessment: 0-10 Pain Score:  (unrated) Pain Location: BIL hips with movement Pain Descriptors / Indicators: Discomfort Pain Intervention(s): Limited activity within patient's tolerance, Monitored during session  Home Living                                          Prior Functioning/Environment              Frequency  Min 1X/week        Progress Toward Goals  OT Goals(current goals can now be found in the care plan section)  Progress towards OT goals: Progressing toward goals  Acute Rehab OT Goals Patient Stated Goal: Go home OT Goal Formulation: With patient Time For Goal Achievement: 05/25/22 Potential to  Achieve Goals: Good ADL Goals Pt Will Perform Lower Body Dressing: with modified independence;sit to/from stand Pt Will Transfer to Toilet: with modified independence;ambulating Pt Will Perform Toileting - Clothing Manipulation and hygiene: with modified independence;sit to/from stand  Plan Discharge plan remains appropriate;Frequency remains appropriate    Co-evaluation                 AM-PAC OT "6 Clicks" Daily Activity     Outcome Measure   Help from another person eating meals?: None Help from another person taking care of personal grooming?: A Little Help from another person toileting, which includes using toliet, bedpan, or urinal?: A Little Help from another person bathing (including washing, rinsing, drying)?: A Little Help from another person to put on and taking off regular upper body clothing?: None Help from another person to put on and taking off regular lower body clothing?: None 6 Click Score: 21    End of Session    OT Visit Diagnosis: Unsteadiness on feet (R26.81)   Activity Tolerance Patient tolerated treatment well   Patient Left in bed;with call bell/phone within reach   Nurse Communication Mobility status        Time: 1451-1501 OT Time Calculation (min): 10 min  Charges: OT General Charges $OT Visit: 1 Visit OT Treatments $Therapeutic Activity: 8-22 mins  Linward Foster, MS, OTR/L   Alvester Morin 05/19/2022, 3:14 PM

## 2022-05-20 DIAGNOSIS — R7881 Bacteremia: Secondary | ICD-10-CM | POA: Diagnosis not present

## 2022-05-20 DIAGNOSIS — B9620 Unspecified Escherichia coli [E. coli] as the cause of diseases classified elsewhere: Secondary | ICD-10-CM | POA: Diagnosis not present

## 2022-05-20 LAB — CA 19-9 (SERIAL): CA 19-9: 14 U/mL (ref 0–35)

## 2022-05-20 LAB — CA 125: Cancer Antigen (CA) 125: 10.7 U/mL (ref 0.0–38.1)

## 2022-05-20 NOTE — Progress Notes (Signed)
Occupational Therapy Treatment Patient Details Name: Madeline Taylor MRN: [id number] DOB: 1935-06-04 Today's Date: 05/20/2022   History of present illness Pt is an 87 y/o F admitted on 04/22/22 after presenting with encephalopathy & hypothermia. Pt had COVID about a week ago with worsening behavior since then, as well as sleep deprivation. PMH: dementia, HTN, TIAs   OT comments  Pt received standing in bathroom. Appearing alert; pt demonstrating MOD (I)/distant supervision for ADLs.  No further OT needs in acute care; supervision to be provided by nursing/sitter staff. Otherwise, no concerns about ADL/mobility. Patient with no further need for OT in acute care; discharge OT services.    Recommendations for follow up therapy are one component of a multi-disciplinary discharge planning process, led by the attending physician.  Recommendations may be updated based on patient status, additional functional criteria and insurance authorization.    Assistance Recommended at Discharge Intermittent Supervision/Assistance  Patient can return home with the following  Assistance with cooking/housework;Direct supervision/assist for medications management;Direct supervision/assist for financial management;Assist for transportation   Equipment Recommendations  None recommended by OT    Recommendations for Other Services      Precautions / Restrictions Precautions Precautions: Fall Restrictions Weight Bearing Restrictions: No       Mobility Bed Mobility Overal bed mobility: Independent                  Transfers Overall transfer level: Independent                       Balance Overall balance assessment: No apparent balance deficits (not formally assessed)                                         ADL either performed or assessed with clinical judgement   ADL Overall ADL's : Needs assistance/impaired                                        General ADL Comments: Pt requires intermittent distant supervision for ADLs. OT arrived and pt was in bathroom, pulling up her pants after using the toilet. Pt demonstrated ability to touch her toes from seated position; states she put her own shoes on today. Per nursing staff, is completing ADLs without assistance; has sitter for cognitive deficits.    Extremity/Trunk Assessment Upper Extremity Assessment Upper Extremity Assessment: Overall WFL for tasks assessed   Lower Extremity Assessment Lower Extremity Assessment: Overall WFL for tasks assessed        Vision       Perception     Praxis      Cognition Arousal/Alertness: Awake/alert Behavior During Therapy: WFL for tasks assessed/performed Overall Cognitive Status: History of cognitive impairments - at baseline                                 General Comments: Pleasant towards OT, but irritable discussing other medical staff; pt stating people don't believe her; OT provided validation of pt's feelings.        Exercises      Shoulder Instructions       General Comments Pt distant supervision for ADLs, which is not going to improve given pt's underlying cognitive deficits; needs supervised living  situation.    Pertinent Vitals/ Pain       Pain Assessment Pain Assessment: No/denies pain  Home Living                                          Prior Functioning/Environment              Frequency           Progress Toward Goals  OT Goals(current goals can now be found in the care plan section)  Progress towards OT goals: Goals met/education completed, patient discharged from OT  Acute Rehab OT Goals Patient Stated Goal: Get better OT Goal Formulation: All assessment and education complete, DC therapy  Plan Discharge plan remains appropriate;All goals met and education completed, patient discharged from OT services    Co-evaluation                 AM-PAC OT "6  Clicks" Daily Activity     Outcome Measure   Help from another person eating meals?: None Help from another person taking care of personal grooming?: None Help from another person toileting, which includes using toliet, bedpan, or urinal?: None Help from another person bathing (including washing, rinsing, drying)?: None Help from another person to put on and taking off regular upper body clothing?: None Help from another person to put on and taking off regular lower body clothing?: None 6 Click Score: 24    End of Session        Activity Tolerance Patient tolerated treatment well   Patient Left in bed;with call bell/phone within reach   Nurse Communication Mobility status        Time: 1442-1450 OT Time Calculation (min): 8 min  Charges: OT General Charges $OT Visit: 1 Visit OT Treatments $Self Care/Home Management : 8-22 mins  Madeline Foster, MS, OTR/L   Madeline Taylor 05/20/2022, 3:16 PM

## 2022-05-20 NOTE — Progress Notes (Signed)
[facility]  - [place] at [facility]   PATIENT NAME: Madeline Taylor    MR#:  [id number]  DATE OF BIRTH:  [birth year]  SUBJECTIVE:   No family at bedside. Patient is quite pleasant watching television.  Per staff appears very calm and pleasant. Ambulates very well.   VITALS:  Blood pressure (!) 155/73, pulse 67, temperature 97.6 F (36.4 C), resp. rate 18, height 4\' 11"  (1.499 m), weight 49.4 kg, SpO2 100 %.  PHYSICAL EXAMINATION:   GENERAL:  87 y.o.-year-old patient with no acute distress.  LUNGS: Normal breath sounds bilaterally, no wheezing CARDIOVASCULAR: S1, S2 normal. No murmur   ABDOMEN: Soft, nontender, nondistended. Bowel sounds present.  EXTREMITIES: No  edema b/l.    NEUROLOGIC: nonfocal  patient is alert and awake SKIN: No obvious rash, lesion, or ulcer.   LABORATORY PANEL:  CBC Recent Labs  Lab 05/19/22 0519  WBC 5.4  HGB 11.4*  HCT 34.7*  PLT 252     Chemistries  Recent Labs  Lab 05/18/22 0512  NA 137  K 3.6  CL 108  CO2 23  GLUCOSE 102*  BUN 12  CREATININE 0.76  CALCIUM 8.5*    Cardiac Enzymes No results for input(s): "TROPONINI" in the last 168 hours. RADIOLOGY:  CT PANCREAS ABD W/WO  Result Date: 05/18/2022 CLINICAL DATA:  Pancreatic pseudocysts. Evaluate for pancreatic neoplasm. EXAM: CT ABDOMEN WITHOUT AND WITH CONTRAST TECHNIQUE: Multidetector CT imaging of the abdomen was performed following the standard protocol before and following the bolus administration of intravenous contrast. RADIATION DOSE REDUCTION: This exam was performed according to the departmental dose-optimization program which includes automated exposure control, adjustment of the mA and/or kV according to patient size and/or use of iterative reconstruction technique. CONTRAST:  OMNIPAQUE IOHEXOL 300 MG/ML  SOLN COMPARISON:  Noncontrast CT 05/17/2022, CT 03/18/2018 with contrast FINDINGS: Lower chest:  Lung bases are clear. Hepatobiliary: Calcified lesion  in the dome the RIGHT hepatic lobe measuring 7 mm not changed in size from CT 2020 but increased peripheral calcification consistent with benign finding. No biliary duct dilatation. Postcholecystectomy. Common bile duct normal caliber. Pancreas: Well-circumscribed fluid collection at the tail the pancreas with thin enhancing rim and central low-attenuation measures 3.8 x 2.5 cm (image 66/7). No communication with the pancreatic parenchyma or pancreatic duct. Residual soft tissue thickening in the inferior margin of the splenic pancreatic interval (image 58/5) appears post inflammatory. Nodular thickening of the LEFT adrenal gland to 14 mm increased from 12 mm. There is no pancreatic duct dilatation. No lesion within the pancreas suggest neoplasm. Spleen: Normal spleen. Adrenals/urinary tract: Thickened LEFT adrenal gland as above measures 15 mm compared to 12 mm on CT 2020. On recent CT 04/21/2022 thickened gland has low attenuation consistent benign adenoma. There several segments of cortical hypoenhancement of the RIGHT kidney. For example 8 mm focus of cortical hypoenhancement on image 66/7 in the RIGHT kidney. Similar 12 mm lesion on image 72/7. Upper pole hypoperfusion on image 54/7. Normal cortical perfusion to the LEFT kidney. Proximal ureters appear normal. Stomach/Bowel: Stomach normal. Multiple venous collaterals in the gastrosplenic ligament. Duodenum limited view of the bowel unremarkable. Vascular/Lymphatic: Abdominal aorta is normal caliber with atherosclerotic calcification. There is no retroperitoneal or periportal lymphadenopathy. No pelvic lymphadenopathy. Musculoskeletal: No aggressive osseous lesion IMPRESSION: 1. Well-formed pseudocyst at  the tail the pancreas. 2. No evidence of pancreatic neoplasm. 3. Multifocal cortical hypoperfusion of the RIGHT kidney. Differential include multifocal pyelonephritis versus renal infarctions. Recommend correlation and consider follow-up  CT or MRI to  demonstrate resolution. LEFT kidney is normal. 4. Enlargement of the LEFT adrenal gland has low attenuation consistent with adrenal adenoma. Electronically Signed   By: Genevive Bi M.D.   On: 05/18/2022 15:50    Assessment and Plan 87 y.o. female with medical history significant of dementia, hypertension, history of TIAs presenting with encephalopathy, hypothermia  Per daughter, patient had worsening dementia, frequently Lost walking out of the house.  But normally has good appetite.  For the last week, patient had significant increased agitation, has not been sleeping well.  Bacteremia due to Escherichia coli --Fever of 102 on 4/27.   --Blood cultures positive for E. coli.   --Likely urine source with urinalysis positive.  -- Started empirically on Rocephin on 4/27.    --Urine culture resulted as negative but urine culture sent on 4/28 and antibiotics were started on 4/27.   --Urine analysis was positive.  Still suspect a urine source.  Pt received IV cefazolin.  Can  switch over to high-dose amoxicillin from today  Pancreatic pseudocyst Seen on CT scan of the abdomen without contrast.  -CT scan pancreatic protocol shows Pancreatic Pseudocyst. NO evidence of neoplasm --CA 19-9--wnl and CA-125--wnl   Vascular dementia (HCC) --Dementia with behavioral disturbance.  -- On Seroquel at night.  Risperdal as needed during the day only if needed.   --Patient calm and cooperative and answers questions.  -- Patient has not been wandering.  Sleeping well with Seroquel at night.  Has not had any behavioral disturbance per staff. Quiet pleasant   Essential hypertension --On amlodipine and losartan   History of TIA (transient ischemic attack) -MRI brain was negative for acute intracranial abnormality.   Hypothermia --Resolved.   Hyponatremia --Sodium 1 point less than normal range.   Facial bruising, sequela --Bruising around the left eye upper and lower lids and left frontal hematoma    Metabolic acidosis Improved   overall medically stable.  TOC is working with daughter regarding discharge plan. Patient has two places was offered bed. Awaiting response from daughter. Patient is medically stable for discharge since 05/19/22   Family communication : spoke with daughter [name] and discussed CT scan results 5/1 Consults : CODE STATUS: full DVT Prophylaxis : ambulatory Level of care: Telemetry Medical Status is: Inpatient Remains inpatient appropriate because: patient medically stable. Awaiting TOC to discuss discharge options with daughter    TOTAL TIME TAKING CARE OF THIS PATIENT: 35 minutes.  >50% time spent on counselling and coordination of care  Note: This dictation was prepared with Dragon dictation along with smaller phrase technology. Any transcriptional errors that result from this process are unintentional.  [name] M.D    [facility]   CC: Primary care physician; [name], NP

## 2022-05-20 NOTE — TOC Progression Note (Signed)
Transition of Care [facility]) - Progression Note    Patient Details  Name: Madeline Taylor MRN: [id number] Date of Birth: 15-Feb-1935  Transition of [facility]) CM/SW Contact  Halford Chessman Phone Number: 05/20/2022, 6:46 PM  Clinical Narrative:     Per Sue Lush in admissions at [facility] they can not accept patient.  TOC to continue to look for placement.  Expected Discharge Plan: Long Term Nursing Home Barriers to Discharge: No SNF bed  Expected Discharge Plan and Services       Living arrangements for the past 2 months: Single Family Home                 DME Arranged: Walker rolling DME Agency: [employer] Date DME Agency Contacted: 04/25/22 Time DME Agency Contacted: [phone] Representative spoke with at DME Agency: [name]             Social Determinants of Health (SDOH) Interventions SDOH Screenings   Food Insecurity: Patient Unable To Answer (04/23/2022)  Housing: Low Risk  (04/23/2022)  Transportation Needs: No Transportation Needs (04/23/2022)  Utilities: Not At Risk (04/23/2022)  Alcohol Screen: Low Risk  (05/21/2021)  Depression (PHQ2-9): Low Risk  (05/21/2021)  Financial Resource Strain: Low Risk  (05/21/2021)  Physical Activity: Inactive (05/21/2021)  Social Connections: Moderately Integrated (05/21/2021)  Stress: No Stress Concern Present (05/21/2021)  Tobacco Use: Low Risk  (04/23/2022)    Readmission Risk Interventions     No data to display

## 2022-05-20 NOTE — Plan of Care (Signed)

## 2022-05-21 DIAGNOSIS — R7881 Bacteremia: Secondary | ICD-10-CM | POA: Diagnosis not present

## 2022-05-21 DIAGNOSIS — B9620 Unspecified Escherichia coli [E. coli] as the cause of diseases classified elsewhere: Secondary | ICD-10-CM | POA: Diagnosis not present

## 2022-05-21 NOTE — Plan of Care (Signed)

## 2022-05-21 NOTE — Progress Notes (Signed)
[facility]  - [place] at [facility]   PATIENT NAME: Madeline Taylor    MR#:  [id number]  DATE OF BIRTH:  Mar 20, 1935  SUBJECTIVE:   No family at bedside. Patient is quite pleasant sitting there near the window and sipping on some tea  per staff appears very calm and pleasant. Ambulates very well.   VITALS:  Blood pressure (!) 147/65, pulse 62, temperature 97.9 F (36.6 C), resp. rate 18, height 4\' 11"  (1.499 m), weight 49.4 kg, SpO2 99 %.  PHYSICAL EXAMINATION:   GENERAL:  87 y.o.-year-old patient with no acute distress.  LUNGS: Normal breath sounds bilaterally, no wheezing CARDIOVASCULAR: S1, S2 normal. No murmur   ABDOMEN: Soft, nontender, nondistended. Bowel sounds present.  EXTREMITIES: No  edema b/l.    NEUROLOGIC: nonfocal  patient is alert and awake SKIN: No obvious rash, lesion, or ulcer.   LABORATORY PANEL:  CBC Recent Labs  Lab 05/19/22 0519  WBC 5.4  HGB 11.4*  HCT 34.7*  PLT 252     Chemistries  Recent Labs  Lab 05/18/22 0512  NA 137  K 3.6  CL 108  CO2 23  GLUCOSE 102*  BUN 12  CREATININE 0.76  CALCIUM 8.5*     Assessment and Plan 87 y.o. female with medical history significant of dementia, hypertension, history of TIAs presenting with encephalopathy, hypothermia  Per daughter, patient had worsening dementia, frequently Lost walking out of the house.  But normally has good appetite.  For the last week, patient had significant increased agitation, has not been sleeping well.  Bacteremia due to Escherichia coli --Fever of 102 on 4/27.   --Blood cultures positive for E. coli.   --Likely urine source with urinalysis positive.  -- Started empirically on Rocephin on 4/27.    --Urine culture resulted as negative but urine culture sent on 4/28 and antibiotics were started on 4/27.   --Urine analysis was positive.  Still suspect a urine source.  Pt received IV cefazolin.  Can  switch over to high-dose amoxicillin from  today  Pancreatic pseudocyst Seen on CT scan of the abdomen without contrast.  -CT scan pancreatic protocol shows Pancreatic Pseudocyst. NO evidence of neoplasm --CA 19-9--wnl and CA-125--wnl   Vascular dementia (HCC) --Dementia with behavioral disturbance.  -- On Seroquel at night.  Risperdal as needed during the day only if needed.   --Patient calm and cooperative and answers questions.  -- Patient has not been wandering.  Sleeping well with Seroquel at night.  Has not had any behavioral disturbance per staff. Quiet pleasant   Essential hypertension --On amlodipine and losartan   History of TIA (transient ischemic attack) -MRI brain was negative for acute intracranial abnormality.   Hypothermia --Resolved.   Hyponatremia --Sodium 1 point less than normal range.   Facial bruising, sequela --Bruising around the left eye upper and lower lids and left frontal hematoma   Metabolic acidosis Improved   overall medically stable.  TOC is working with daughter regarding discharge plan. Patient has two places was offered bed. Awaiting response from daughter. Patient is medically stable for discharge since 05/19/22   Family communication : spoke with daughter [name] and discussed CT scan results 5/1 Consults : CODE STATUS: full DVT Prophylaxis : ambulatory Level of care: Telemetry Medical Status is: Inpatient Remains inpatient appropriate because: patient medically stable. Awaiting TOC to discuss discharge options with daughter    TOTAL TIME TAKING CARE OF THIS PATIENT: 35 minutes.  >50% time spent on counselling and coordination  of care  Note: This dictation was prepared with Dragon dictation along with smaller phrase technology. Any transcriptional errors that result from this process are unintentional.  [name] M.D    [facility]   CC: Primary care physician; [name], NP

## 2022-05-21 NOTE — TOC Progression Note (Signed)
Transition of Care [facility]) - Progression Note    Patient Details  Name: Madeline Taylor MRN: [id number] Date of Birth: 1935-07-04  Transition of Care [facility]) CM/SW Contact  Darleene Cleaver, [place] Phone Number: 05/21/2022, 7:28 PM  Clinical Narrative:     CSW called [facility], [facility], and [facility] trying to find memory care placement with a locked unit for patient, none of the ALFs have a bed available for patient.  [facility] declined patient due to history of wandering.  Patient does not have a sitter at this time.  Expected Discharge Plan: Long Term Nursing Home Barriers to Discharge: No SNF bed  Expected Discharge Plan and Services       Living arrangements for the past 2 months: Single Family Home                 DME Arranged: Walker rolling DME Agency: [employer] Date DME Agency Contacted: 04/25/22 Time DME Agency Contacted: [phone] Representative spoke with at DME Agency: [name]             Social Determinants of Health (SDOH) Interventions SDOH Screenings   Food Insecurity: Patient Unable To Answer (04/23/2022)  Housing: Low Risk  (04/23/2022)  Transportation Needs: No Transportation Needs (04/23/2022)  Utilities: Not At Risk (04/23/2022)  Alcohol Screen: Low Risk  (05/21/2021)  Depression (PHQ2-9): Low Risk  (05/21/2021)  Financial Resource Strain: Low Risk  (05/21/2021)  Physical Activity: Inactive (05/21/2021)  Social Connections: Moderately Integrated (05/21/2021)  Stress: No Stress Concern Present (05/21/2021)  Tobacco Use: Low Risk  (04/23/2022)    Readmission Risk Interventions     No data to display

## 2022-05-22 DIAGNOSIS — B9620 Unspecified Escherichia coli [E. coli] as the cause of diseases classified elsewhere: Secondary | ICD-10-CM | POA: Diagnosis not present

## 2022-05-22 DIAGNOSIS — R7881 Bacteremia: Secondary | ICD-10-CM | POA: Diagnosis not present

## 2022-05-22 NOTE — TOC Progression Note (Signed)
Transition of Care [facility]) - Progression Note    Patient Details  Name: Madeline Taylor MRN: [id number] Date of Birth: [birth year]  [facility]) CM/SW Contact  Darolyn Rua, [place] Phone Number: 05/22/2022, 2:06 PM  Clinical Narrative:     CSW updated patient's daughter [name] that per MD patient is medically stable to discharge. Therefore, discharge plan has to be established. [name] reported they had declined bed offers and requested [facility] re ask facilities that already said no. CSW informed [name] that [facility] has done this, explained the bed denials and encouraged her to choose bed offers that are available. CSW reiterated that patient is medically stable to discharge, if family does not wish patient to return home they will need to choose a snf option available. At this time all  bed search options have been exhausted. [name] reports she needed to speak to someone and call me back.     Expected Discharge Plan: Long Term Nursing Home Barriers to Discharge: No SNF bed  Expected Discharge Plan and Services       Living arrangements for the past 2 months: Single Family Home                 DME Arranged: Walker rolling DME Agency: [employer] Date DME Agency Contacted: 04/25/22 Time DME Agency Contacted: [phone] Representative spoke with at DME Agency: [name]             Social Determinants of Health (SDOH) Interventions SDOH Screenings   Food Insecurity: Patient Unable To Answer (04/23/2022)  Housing: Low Risk  (04/23/2022)  Transportation Needs: No Transportation Needs (04/23/2022)  Utilities: Not At Risk (04/23/2022)  Alcohol Screen: Low Risk  (05/21/2021)  Depression (PHQ2-9): Low Risk  (05/21/2021)  Financial Resource Strain: Low Risk  (05/21/2021)  Physical Activity: Inactive (05/21/2021)  Social Connections: Moderately Integrated (05/21/2021)  Stress: No Stress Concern Present (05/21/2021)  Tobacco Use: Low Risk  (04/23/2022)    Readmission Risk Interventions     No  data to display

## 2022-05-22 NOTE — Plan of Care (Signed)

## 2022-05-22 NOTE — Progress Notes (Signed)
[facility]  - [place] at [facility]   PATIENT NAME: Madeline Taylor    MR#:  [id number]  DATE OF BIRTH:  Aug 06, 1935  SUBJECTIVE:   No family at bedside. Patient is quite pleasant sitting there near the window and sipping on some tea  per staff appears very calm and pleasant. Ambulates very well.   VITALS:  Blood pressure 137/64, pulse 65, temperature 97.8 F (36.6 C), resp. rate 18, height 4\' 11"  (1.499 m), weight 49.4 kg, SpO2 99 %.  PHYSICAL EXAMINATION:   GENERAL:  87 y.o.-year-old patient with no acute distress.  LUNGS: Normal breath sounds bilaterally, no wheezing CARDIOVASCULAR: S1, S2 normal. No murmur   ABDOMEN: Soft, nontender, nondistended. Bowel sounds present.  EXTREMITIES: No  edema b/l.    NEUROLOGIC: nonfocal  patient is alert and awake SKIN: No obvious rash, lesion, or ulcer.   LABORATORY PANEL:  CBC Recent Labs  Lab 05/19/22 0519  WBC 5.4  HGB 11.4*  HCT 34.7*  PLT 252     Chemistries  Recent Labs  Lab 05/18/22 0512  NA 137  K 3.6  CL 108  CO2 23  GLUCOSE 102*  BUN 12  CREATININE 0.76  CALCIUM 8.5*     Assessment and Plan 87 y.o. female with medical history significant of dementia, hypertension, history of TIAs presenting with encephalopathy, hypothermia  Per daughter, patient had worsening dementia, frequently Lost walking out of the house.  But normally has good appetite.  For the last week, patient had significant increased agitation, has not been sleeping well.  Bacteremia due to Escherichia coli --Fever of 102 on 4/27.   --Blood cultures positive for E. coli.   --Likely urine source with urinalysis positive.  -- Started empirically on Rocephin on 4/27.    --Urine culture resulted as negative but urine culture sent on 4/28 and antibiotics were started on 4/27.   --Urine analysis was positive.  Still suspect a urine source.  Pt received IV cefazolin.  Can  switch over to high-dose amoxicillin from today  Pancreatic  pseudocyst Seen on CT scan of the abdomen without contrast.  -CT scan pancreatic protocol shows Pancreatic Pseudocyst. NO evidence of neoplasm --CA 19-9--wnl and CA-125--wnl   Vascular dementia (HCC) --Dementia with behavioral disturbance.  -- On Seroquel at night.  Risperdal as needed during the day only if needed.   --Patient calm and cooperative and answers questions.  -- Patient has not been wandering.  Sleeping well with Seroquel at night.  Has not had any behavioral disturbance per staff. Quiet pleasant   Essential hypertension --On amlodipine and losartan   History of TIA (transient ischemic attack) -MRI brain was negative for acute intracranial abnormality.   Hypothermia --Resolved.   Hyponatremia --Sodium 1 point less than normal range.   Facial bruising, sequela --Bruising around the left eye upper and lower lids and left frontal hematoma   Metabolic acidosis Improved   overall medically stable.  TOC is working with daughter regarding discharge plan. Patient has two places was offered bed. Awaiting response from daughter. Patient is medically stable for discharge since 05/19/22   Family communication : spoke with daughter Madeline Taylor and discussed CT scan results 5/1 Consults : CODE STATUS: full DVT Prophylaxis : ambulatory Level of care: Telemetry Medical Status is: Inpatient Remains inpatient appropriate because: patient medically stable. Awaiting TOC to discuss discharge options with daughter    TOTAL TIME TAKING CARE OF THIS PATIENT: 35 minutes.  >50% time spent on counselling and coordination of  care  Note: This dictation was prepared with Dragon dictation along with smaller phrase technology. Any transcriptional errors that result from this process are unintentional.  [name] M.D    [facility]   CC: Primary care physician; [name], NP

## 2022-05-23 DIAGNOSIS — B9620 Unspecified Escherichia coli [E. coli] as the cause of diseases classified elsewhere: Secondary | ICD-10-CM | POA: Diagnosis not present

## 2022-05-23 DIAGNOSIS — R7881 Bacteremia: Secondary | ICD-10-CM | POA: Diagnosis not present

## 2022-05-23 NOTE — Progress Notes (Signed)
[facility]  - [place] at [facility]   PATIENT NAME: Madeline Taylor    MR#:  [id number]  DATE OF BIRTH:  17-Apr-1935  SUBJECTIVE:   No family at bedside. Patient is quite pleasant   per staff appears very calm and pleasant. Ambulates very well.   VITALS:  Blood pressure (!) 155/56, pulse 66, temperature 98.1 F (36.7 C), resp. rate 18, height 4\' 11"  (1.499 m), weight 49.4 kg, SpO2 100 %.  PHYSICAL EXAMINATION:   GENERAL:  87 y.o.-year-old patient with no acute distress.  LUNGS: Normal breath sounds bilaterally, no wheezing CARDIOVASCULAR: S1, S2 normal. No murmur   ABDOMEN: Soft, nontender, nondistended. Bowel sounds present.  EXTREMITIES: No  edema b/l.    NEUROLOGIC: nonfocal  patient is alert and awake SKIN: No obvious rash, lesion, or ulcer.   LABORATORY PANEL:  CBC Recent Labs  Lab 05/19/22 0519  WBC 5.4  HGB 11.4*  HCT 34.7*  PLT 252     Chemistries  Recent Labs  Lab 05/18/22 0512  NA 137  K 3.6  CL 108  CO2 23  GLUCOSE 102*  BUN 12  CREATININE 0.76  CALCIUM 8.5*     Assessment and Plan 87 y.o. female with medical history significant of dementia, hypertension, history of TIAs presenting with encephalopathy, hypothermia  Per daughter, patient had worsening dementia, frequently Lost walking out of the house.  But normally has good appetite.  For the last week, patient had significant increased agitation, has not been sleeping well.  Bacteremia due to Escherichia coli --Fever of 102 on 4/27.   --Blood cultures positive for E. coli.   --Likely urine source with urinalysis positive.  -- Started empirically on Rocephin on 4/27.    --Urine culture resulted as negative but urine culture sent on 4/28 and antibiotics were started on 4/27.   --Urine analysis was positive.  Still suspect a urine source.  Pt received IV cefazolin.  Can  switch over to high-dose amoxicillin from today  Pancreatic pseudocyst Seen on CT scan of the abdomen without  contrast.  -CT scan pancreatic protocol shows Pancreatic Pseudocyst. NO evidence of neoplasm --CA 19-9--wnl and CA-125--wnl   Vascular dementia (HCC) --Dementia with behavioral disturbance.  -- On Seroquel at night.  Risperdal as needed during the day only if needed.   --Patient calm and cooperative and answers questions.  -- Patient has not been wandering.  Sleeping well with Seroquel at night.  Has not had any behavioral disturbance per staff. Quiet pleasant   Essential hypertension --On amlodipine and losartan   History of TIA (transient ischemic attack) -MRI brain was negative for acute intracranial abnormality.   Hypothermia --Resolved.   Hyponatremia --Sodium 1 point less than normal range.   Facial bruising, sequela --Bruising around the left eye upper and lower lids and left frontal hematoma   Metabolic acidosis Improved   overall medically stable.  TOC is working with daughter regarding discharge plan. Patient has two places was offered bed. Awaiting response from daughter. Patient is medically stable for discharge since 05/19/22   Family communication : unable to leave message message for [name], left message for [name] regarding snf choices and discharge planning  consults : CODE STATUS: full DVT Prophylaxis : ambulatory Level of care: Telemetry Medical Status is: Inpatient Remains inpatient appropriate because: patient medically stable. Awaiting TOC to discuss discharge options with daughter    TOTAL TIME TAKING CARE OF THIS PATIENT: 35 minutes.  >50% time spent on counselling and coordination of  care  Note: This dictation was prepared with Dragon dictation along with smaller phrase technology. Any transcriptional errors that result from this process are unintentional.  [name] M.D    [facility]   CC: Primary care physician; [name], NP

## 2022-05-23 NOTE — TOC Progression Note (Addendum)
Transition of Care [facility]) - Progression Note    Patient Details  Name: Madeline Taylor MRN: [id number] Date of Birth: 08/16/1935  Transition of Care [facility]) CM/SW Contact  Carmina Miller, LCSWA Phone Number: 05/23/2022, 3:04 PM  Clinical Narrative:    Update 4:13 pm- CSW received text back from [place] at [employer], she states she will speak with her leadership about pt, CSW explained pt has not had any behaviors since starting meds. Kenney Houseman states she will come and assess pt tomorrow. CSW attempted to call pt's daughter [name] back to provide update, no answer, vm not set up.    CSW spoke with pt's daughter [name], she states [facility] and [facility] are too far away and she would like to hear back from [facility] as they were considering pt but needed to do a clinical review. CSW sent a text message to Kenney Houseman at [facility], requesting an update on pt. CSW did explain to [place] that if [facility] cannot accept pt, the expectation would be for one of the above facilities, CSW explained the lack of local Medicaid beds to pt's daughter as well. TOC will continue to follow.   Expected Discharge Plan: Long Term Nursing Home Barriers to Discharge: No SNF bed  Expected Discharge Plan and Services       Living arrangements for the past 2 months: Single Family Home                 DME Arranged: [place] rolling DME Agency: [employer] Date DME Agency Contacted: 04/25/22 Time DME Agency Contacted: [phone] Representative spoke with at DME Agency: [name]             Social Determinants of Health (SDOH) Interventions SDOH Screenings   Food Insecurity: Patient Unable To Answer (04/23/2022)  Housing: Low Risk  (04/23/2022)  Transportation Needs: No Transportation Needs (04/23/2022)  Utilities: Not At Risk (04/23/2022)  Alcohol Screen: Low Risk  (05/21/2021)  Depression (PHQ2-9): Low Risk  (05/21/2021)  Financial Resource Strain: Low Risk  (05/21/2021)  Physical Activity: Inactive (05/21/2021)   Social Connections: Moderately Integrated (05/21/2021)  Stress: No Stress Concern Present (05/21/2021)  Tobacco Use: Low Risk  (04/23/2022)    Readmission Risk Interventions     No data to display

## 2022-05-23 NOTE — TOC Progression Note (Signed)
Transition of Care [facility]) - Progression Note    Patient Details  Name: Madeline Taylor MRN: [id number] Date of Birth: 08/26/1935  Transition of Care [facility]) CM/SW Contact  Carmina Miller, LCSWA Phone Number: 05/23/2022, 8:50 AM  Clinical Narrative:     CSW attempted to reach both Revonda Standard and [name] in reference to final dc plans, [name]'s vm is not set up, CSW unable to leave a vm, CSW was able to leave a vm for [place]. TOC will continue to follow.   Expected Discharge Plan: Long Term Nursing Home Barriers to Discharge: No SNF bed  Expected Discharge Plan and Services       Living arrangements for the past 2 months: Single Family Home                 DME Arranged: [place] rolling DME Agency: [employer] Date DME Agency Contacted: 04/25/22 Time DME Agency Contacted: [phone] Representative spoke with at DME Agency: [name]             Social Determinants of Health (SDOH) Interventions SDOH Screenings   Food Insecurity: Patient Unable To Answer (04/23/2022)  Housing: Low Risk  (04/23/2022)  Transportation Needs: No Transportation Needs (04/23/2022)  Utilities: Not At Risk (04/23/2022)  Alcohol Screen: Low Risk  (05/21/2021)  Depression (PHQ2-9): Low Risk  (05/21/2021)  Financial Resource Strain: Low Risk  (05/21/2021)  Physical Activity: Inactive (05/21/2021)  Social Connections: Moderately Integrated (05/21/2021)  Stress: No Stress Concern Present (05/21/2021)  Tobacco Use: Low Risk  (04/23/2022)    Readmission Risk Interventions     No data to display

## 2022-05-23 NOTE — Plan of Care (Signed)

## 2022-05-24 DIAGNOSIS — B9620 Unspecified Escherichia coli [E. coli] as the cause of diseases classified elsewhere: Secondary | ICD-10-CM | POA: Diagnosis not present

## 2022-05-24 DIAGNOSIS — R7881 Bacteremia: Secondary | ICD-10-CM | POA: Diagnosis not present

## 2022-05-24 DIAGNOSIS — F03918 Unspecified dementia, unspecified severity, with other behavioral disturbance: Secondary | ICD-10-CM | POA: Diagnosis not present

## 2022-05-24 DIAGNOSIS — I10 Essential (primary) hypertension: Secondary | ICD-10-CM | POA: Diagnosis not present

## 2022-05-24 MED ORDER — IBUPROFEN 400 MG PO TABS
400.0000 mg | ORAL_TABLET | Freq: Three times a day (TID) | ORAL | Status: DC | PRN
  Administered 2022-05-24 – 2022-06-04 (×10): 400 mg via ORAL
  Filled 2022-05-24 (×12): qty 1

## 2022-05-24 NOTE — TOC Progression Note (Addendum)
Transition of Care [facility]) - Progression Note    Patient Details  Name: Madeline Taylor MRN: [id number] Date of Birth: [birth year]  Transition of Care [facility]) CM/SW Contact  Darleene Cleaver, [place] Phone Number: 05/24/2022, 5:57 PM  Clinical Narrative:     CSW received text message from [facility] they are not going to accept patient because of her history of dementia and wandering.  CSW spoke to patient's daughter [name] and informed her that [facility] said no, [facility] has a wait list, [facility] does not have a locked unit.  [facility] and [facility] are private pay only and do not accept Medicaid.  CSW informed her that [facility] in [place] has a bed and [facility] may still have a bed, but CSW will have to double check tomorrow.  CSW reiterated that if patient does go to [facility] or [facility] in [place], they can always continue to try to get closer once bed is available.  CSW will try [facility], but that will be the last option that may take Medicaid.  Expected Discharge Plan: Long Term Nursing Home Barriers to Discharge: No SNF bed  Expected Discharge Plan and Services    ALF verse SNF   Living arrangements for the past 2 months: Single Family Home                 DME Arranged: Walker rolling DME Agency: [employer] Date DME Agency Contacted: 04/25/22 Time DME Agency Contacted: [phone] Representative spoke with at DME Agency: [name]             Social Determinants of Health (SDOH) Interventions SDOH Screenings   Food Insecurity: Patient Unable To Answer (04/23/2022)  Housing: Low Risk  (04/23/2022)  Transportation Needs: No Transportation Needs (04/23/2022)  Utilities: Not At Risk (04/23/2022)  Alcohol Screen: Low Risk  (05/21/2021)  Depression (PHQ2-9): Low Risk  (05/21/2021)  Financial Resource Strain: Low Risk  (05/21/2021)  Physical Activity: Inactive (05/21/2021)  Social Connections: Moderately Integrated (05/21/2021)  Stress: No Stress Concern  Present (05/21/2021)  Tobacco Use: Low Risk  (04/23/2022)    Readmission Risk Interventions     No data to display

## 2022-05-24 NOTE — Plan of Care (Signed)

## 2022-05-24 NOTE — TOC Progression Note (Signed)
Transition of Care [facility]) - Progression Note    Patient Details  Name: Madeline Taylor MRN: [id number] Date of Birth: [birth year]  Transition of Care [facility]) CM/SW Contact  Marlowe Sax, RN Phone Number: 05/24/2022, 9:06 AM  Clinical Narrative:    Reached out to Kenney Houseman at [facility] to inquire if they plan to come today to assess the patient waiting on response    Expected Discharge Plan: Long Term Nursing Home Barriers to Discharge: No SNF bed  Expected Discharge Plan and Services       Living arrangements for the past 2 months: Single Family Home                 DME Arranged: [place] rolling DME Agency: [employer] Date DME Agency Contacted: 04/25/22 Time DME Agency Contacted: [phone] Representative spoke with at DME Agency: [name]             Social Determinants of Health (SDOH) Interventions SDOH Screenings   Food Insecurity: Patient Unable To Answer (04/23/2022)  Housing: Low Risk  (04/23/2022)  Transportation Needs: No Transportation Needs (04/23/2022)  Utilities: Not At Risk (04/23/2022)  Alcohol Screen: Low Risk  (05/21/2021)  Depression (PHQ2-9): Low Risk  (05/21/2021)  Financial Resource Strain: Low Risk  (05/21/2021)  Physical Activity: Inactive (05/21/2021)  Social Connections: Moderately Integrated (05/21/2021)  Stress: No Stress Concern Present (05/21/2021)  Tobacco Use: Low Risk  (04/23/2022)    Readmission Risk Interventions     No data to display

## 2022-05-24 NOTE — Progress Notes (Signed)
[facility]  - [place] at [facility]   PATIENT NAME: Madeline Taylor    MR#:  [id number]  DATE OF BIRTH:  08-09-1935  SUBJECTIVE:   No family at bedside. Patient is quite pleasant   per staff appears very calm and pleasant. Ambulates very well. Keeps asking to go to [facility]:  Blood pressure 139/66, pulse 89, temperature 98.1 F (36.7 C), resp. rate 15, height 4\' 11"  (1.499 m), weight 49.4 kg, SpO2 97 %.  PHYSICAL EXAMINATION:   GENERAL:  87 y.o.-year-old patient with no acute distress.  LUNGS: Normal breath sounds bilaterally, no wheezing CARDIOVASCULAR: S1, S2 normal. No murmur   ABDOMEN: Soft, nontender, nondistended. Bowel sounds present.  EXTREMITIES: No  edema b/l.    NEUROLOGIC: nonfocal  patient is alert and awake SKIN: No obvious rash, lesion, or ulcer.   LABORATORY PANEL:  CBC Recent Labs  Lab 05/19/22 0519  WBC 5.4  HGB 11.4*  HCT 34.7*  PLT 252     Chemistries  Recent Labs  Lab 05/18/22 0512  NA 137  K 3.6  CL 108  CO2 23  GLUCOSE 102*  BUN 12  CREATININE 0.76  CALCIUM 8.5*     Assessment and Plan 87 y.o. female with medical history significant of dementia, hypertension, history of TIAs presenting with encephalopathy, hypothermia  Per daughter, patient had worsening dementia, frequently Lost walking out of the house.  But normally has good appetite.  For the last week, patient had significant increased agitation, has not been sleeping well.  Bacteremia due to Escherichia coli --Fever of 102 on 4/27.   --Blood cultures positive for E. coli.   --Likely urine source with urinalysis positive.  -- Started empirically on Rocephin on 4/27.    --Urine culture resulted as negative but urine culture sent on 4/28 and antibiotics were started on 4/27.   --Urine analysis was positive.  Still suspect a urine source.  Pt received IV cefazolin.  Can  switch over to high-dose amoxicillin from today  Pancreatic pseudocyst Seen on CT  scan of the abdomen without contrast.  -CT scan pancreatic protocol shows Pancreatic Pseudocyst. NO evidence of neoplasm --CA 19-9--wnl and CA-125--wnl   Vascular dementia (HCC) --Dementia with behavioral disturbance.  -- On Seroquel at night.  Risperdal as needed during the day only if needed.   --Patient calm and cooperative and answers questions.  -- Patient has not been wandering.  Sleeping well with Seroquel at night.  Has not had any behavioral disturbance per staff. Quiet pleasant   Essential hypertension --On amlodipine and losartan   History of TIA (transient ischemic attack) -MRI brain was negative for acute intracranial abnormality.   Hypothermia --Resolved.   Hyponatremia --Sodium 1 point less than normal range.   Facial bruising, sequela --Bruising around the left eye upper and lower lids and left frontal hematoma   Metabolic acidosis Improved   overall medically stable.  TOC is working with daughter regarding discharge plan. Patient has two places was offered bed. Awaiting response from daughter. Patient is medically stable for discharge since 05/19/22   Family communication : unable to leave message message for [name], left message for [name] regarding snf choices and discharge planning  consults : CODE STATUS: full DVT Prophylaxis : ambulatory Level of care: Telemetry Medical Status is: Inpatient Remains inpatient appropriate because: patient medically stable. Awaiting TOC to discuss discharge options with daughter    TOTAL TIME TAKING CARE OF THIS PATIENT: 35 minutes.  >50% time spent  on counselling and coordination of care  Note: This dictation was prepared with Dragon dictation along with smaller phrase technology. Any transcriptional errors that result from this process are unintentional.  [name] M.D    [facility]   CC: Primary care physician; [name], NP

## 2022-05-24 NOTE — Progress Notes (Signed)
Mobility Specialist - Progress Note   05/24/22 1208  Mobility  Activity Ambulated independently in hallway;Ambulated with assistance in hallway  Level of Assistance Standby assist, set-up cues, supervision of patient - no hands on  Assistive Device None  Distance Ambulated (ft) 320 ft  Activity Response Tolerated well  $Mobility charge 1 Mobility  Mobility Specialist Start Time (ACUTE ONLY) 1145  Mobility Specialist Stop Time (ACUTE ONLY) 1205  Mobility Specialist Time Calculation (min) (ACUTE ONLY) 20 min   Pt standing at bedside upon entry, utilizing RA. Pt requesting to amb in the hallway. Pt amb two laps around the NS with supervision, tolerated well. Pt expressed slight soreness in hips and BLE due to prior amb earlier this date. Pt returned to room, left sitting up in bed with needs within reach. RN present at bedside.   Zetta Bills Mobility Specialist 05/24/22 12:12 PM

## 2022-05-25 DIAGNOSIS — R7881 Bacteremia: Secondary | ICD-10-CM | POA: Diagnosis not present

## 2022-05-25 DIAGNOSIS — B9620 Unspecified Escherichia coli [E. coli] as the cause of diseases classified elsewhere: Secondary | ICD-10-CM | POA: Diagnosis not present

## 2022-05-25 NOTE — Progress Notes (Signed)
Mobility Specialist - Progress Note   05/25/22 1607  Mobility  Activity Ambulated with assistance in hallway  Level of Assistance Standby assist, set-up cues, supervision of patient - no hands on  Assistive Device None  Distance Ambulated (ft) 320 ft  Activity Response Tolerated well  $Mobility charge 1 Mobility  Mobility Specialist Start Time (ACUTE ONLY) 1530  Mobility Specialist Stop Time (ACUTE ONLY) 1540  Mobility Specialist Time Calculation (min) (ACUTE ONLY) 10 min   Zetta Bills Mobility Specialist 05/25/22 4:09 PM

## 2022-05-25 NOTE — Progress Notes (Signed)
PROGRESS NOTE    Madeline GUTIERRES  AOZ:[id number] DOB: [birth year] DOA: 04/22/2022 PCP: [name], NP   Brief Narrative:  This 87 yrs old female with medical history significant of dementia, hypertension, history of TIAs presenting with encephalopathy, hypothermia . Per daughter, patient had worsening dementia, frequently Lost walking out of the house.  But normally has good appetite.  For the last week, patient had significant increased agitation, has not been sleeping well  Assessment & Plan:   Principal Problem:   Bacteremia due to Escherichia coli Active Problems:   Pancreatic pseudocyst   Essential hypertension   Vascular dementia (HCC)   History of TIA (transient ischemic attack)   Hypothermia   Osteoarthritis of multiple joints   Metabolic acidosis   Dementia with behavioral disturbance (HCC)   Facial bruising, sequela   Hyponatremia   E. coli bacteremia:  She presented with AMS, and fever of 102 on 05/15/22.   Blood cultures positive for E. coli. Likely urine source with urinalysis positive.  She was started empirically on Rocephin on 4/27.    Urine culture was sent and antibiotics were started on 4/27.   Urine analysis was positive.  Still suspect a urine source.   Patient has completed antibiotics course.   Pancreatic pseudocyst: Seen on CT scan of the abdomen without contrast.  CT scan pancreatic protocol showed Pancreatic Pseudocyst. NO evidence of neoplasm. CA 19-9--wnl and CA-125--wnl   Vascular dementia: Patient has Dementia with behavioral disturbance.  Continue Seroquel at night.  Risperdal as needed during the day only if needed.   Patient is much calmer and cooperative and answers questions.  Patient has not been wandering.  Sleeping well with Seroquel at night.   Has not had any behavioral disturbance per staff. Quiet pleasant   Essential hypertension Continue amlodipine and losartan   History of TIA (transient ischemic attack) MRI brain was  negative for acute intracranial abnormality.   Hypothermia Resolved.   Hyponatremia Resolved.   Facial bruising, sequela Bruising around the left eye upper and lower lids and left frontal hematoma.   Metabolic acidosis Improved / Resolved.   Overall medically stable.  TOC is working with daughter regarding discharge plan. Patient has two places who has offered bed. Awaiting response from daughter. Patient is medically stable for discharge since 05/19/22   DVT prophylaxis: SCDs Code Status: Full code Family Communication:No family at bed side. Disposition Plan:   Status is: Inpatient Remains inpatient appropriate because: Admitted for bacteremia due to E. coli.  TOC is working on memory care placement.  Consultants:   None  Procedures:None  Antimicrobials: Anti-infectives (From admission, onward)    Start     Dose/Rate Route Frequency Ordered Stop   05/20/22 0600  amoxicillin (AMOXIL) capsule 1,000 mg        1,000 mg Oral Every 8 hours 05/19/22 1515 05/21/22 2117   05/18/22 1800  ceFAZolin (ANCEF) IVPB 2g/100 mL premix        2 g 200 mL/hr over 30 Minutes Intravenous Every 8 hours 05/18/22 1338 05/19/22 2213   05/16/22 1000  azithromycin (ZITHROMAX) tablet 250 mg  Status:  Discontinued       See Hyperspace for full Linked Orders Report.   250 mg Oral Daily 05/15/22 1631 05/16/22 0258   05/15/22 1800  cefTRIAXone (ROCEPHIN) 2 g in sodium chloride 0.9 % 100 mL IVPB  Status:  Discontinued        2 g 200 mL/hr over 30 Minutes Intravenous Every 24 hours  05/15/22 1631 05/18/22 1338   05/15/22 1730  azithromycin (ZITHROMAX) tablet 500 mg       See Hyperspace for full Linked Orders Report.   500 mg Oral Daily 05/15/22 1631 05/15/22 1805   04/24/22 1200  vancomycin (VANCOREADY) IVPB 500 mg/100 mL  Status:  Discontinued        500 mg 100 mL/hr over 60 Minutes Intravenous Every 24 hours 04/23/22 1206 04/24/22 1212   04/23/22 1330  vancomycin (VANCOCIN) IVPB 1000 mg/200 mL premix         1,000 mg 200 mL/hr over 60 Minutes Intravenous  Once 04/23/22 1206 04/23/22 1731   04/23/22 1300  ceFEPIme (MAXIPIME) 2 g in sodium chloride 0.9 % 100 mL IVPB  Status:  Discontinued        2 g 200 mL/hr over 30 Minutes Intravenous Every 12 hours 04/23/22 1206 04/24/22 1212      Subjective: Patient was seen and examined at bedside.  Overnight events noted.  Patient appears pleasant, quite,  calm, much cooperative.  Objective: Vitals:   05/24/22 1012 05/24/22 1459 05/24/22 2310 05/25/22 0859  BP: 139/66 127/72 (!) 169/58 130/67  Pulse: 89 72 65 69  Resp: 15 19 18 19   Temp: 98.1 F (36.7 C) 98.1 F (36.7 C) 98.3 F (36.8 C) 98 F (36.7 C)  TempSrc: Oral Oral Oral Oral  SpO2: 97% 99% 100% 98%  Weight:      Height:       No intake or output data in the 24 hours ending 05/25/22 1317  Filed Weights   04/22/22 1757  Weight: 49.4 kg    Examination:  General exam: Appears calm and comfortable, not in any acute distress. Respiratory system: CTA bilaterally. Respiratory effort normal.  RR 15 Cardiovascular system: S1 & S2 heard, regular rate and rhythm, no murmur. Gastrointestinal system: Abdomen is soft, non tender, non distended, BS+ Central nervous system: Alert and oriented x 2. No focal neurological deficits. Extremities: No edema, no cyanosis, no clubbing. Skin: No rashes, lesions or ulcers Psychiatry: Judgement and insight appear normal. Mood & affect appropriate.     Data Reviewed: I have personally reviewed following labs and imaging studies  CBC: Recent Labs  Lab 05/19/22 0519  WBC 5.4  HGB 11.4*  HCT 34.7*  MCV 90.4  PLT 252   Basic Metabolic Panel: No results for input(s): "NA", "K", "CL", "CO2", "GLUCOSE", "BUN", "CREATININE", "CALCIUM", "MG", "PHOS" in the last 168 hours. GFR: Estimated Creatinine Clearance: 35.7 mL/min (by C-G formula based on SCr of 0.76 mg/dL). Liver Function Tests: No results for input(s): "AST", "ALT", "ALKPHOS",  "BILITOT", "PROT", "ALBUMIN" in the last 168 hours. No results for input(s): "LIPASE", "AMYLASE" in the last 168 hours. No results for input(s): "AMMONIA" in the last 168 hours. Coagulation Profile: No results for input(s): "INR", "PROTIME" in the last 168 hours. Cardiac Enzymes: No results for input(s): "CKTOTAL", "CKMB", "CKMBINDEX", "TROPONINI" in the last 168 hours. BNP (last 3 results) No results for input(s): "PROBNP" in the last 8760 hours. HbA1C: No results for input(s): "HGBA1C" in the last 72 hours. CBG: No results for input(s): "GLUCAP" in the last 168 hours. Lipid Profile: No results for input(s): "CHOL", "HDL", "LDLCALC", "TRIG", "CHOLHDL", "LDLDIRECT" in the last 72 hours. Thyroid Function Tests: No results for input(s): "TSH", "T4TOTAL", "FREET4", "T3FREE", "THYROIDAB" in the last 72 hours. Anemia Panel: No results for input(s): "VITAMINB12", "FOLATE", "FERRITIN", "TIBC", "IRON", "RETICCTPCT" in the last 72 hours. Sepsis Labs: No results for input(s): "PROCALCITON", "LATICACIDVEN" in  the last 168 hours.  Recent Results (from the past 240 hour(s))  SARS Coronavirus 2 by RT PCR (hospital order, performed in [facility] hospital lab) *cepheid single result test* Anterior Nasal Swab     Status: None   Collection Time: 05/15/22  3:32 PM   Specimen: Anterior Nasal Swab  Result Value Ref Range Status   SARS Coronavirus 2 by RT PCR NEGATIVE NEGATIVE Final    Comment: (NOTE) SARS-CoV-2 target nucleic acids are NOT DETECTED.  The SARS-CoV-2 RNA is generally detectable in upper and lower respiratory specimens during the acute phase of infection. The lowest concentration of SARS-CoV-2 viral copies this assay can detect is 250 copies / mL. A negative result does not preclude SARS-CoV-2 infection and should not be used as the sole basis for treatment or other patient management decisions.  A negative result may occur with improper specimen collection / handling, submission of  specimen other than nasopharyngeal swab, presence of viral mutation(s) within the areas targeted by this assay, and inadequate number of viral copies (<250 copies / mL). A negative result must be combined with clinical observations, patient history, and epidemiological information.  Fact Sheet for Patients:   RoadLapTop.co.za  Fact Sheet for Healthcare Providers: http://kim-miller.com/  This test is not yet approved or  cleared by the [place] FDA and has been authorized for detection and/or diagnosis of SARS-CoV-2 by FDA under an Emergency Use Authorization (EUA).  This EUA will remain in effect (meaning this test can be used) for the duration of the COVID-19 declaration under Section 564(b)(1) of the Act, 21 U.S.C. section 360bbb-3(b)(1), unless the authorization is terminated or revoked sooner.  Performed at [facility], [address] [place], [place] [address]   Respiratory (~20 pathogens) panel by PCR     Status: None   Collection Time: 05/15/22  3:32 PM   Specimen: Nasopharyngeal Swab; Respiratory  Result Value Ref Range Status   Adenovirus NOT DETECTED NOT DETECTED Final   Coronavirus 229E NOT DETECTED NOT DETECTED Final    Comment: (NOTE) The Coronavirus on the Respiratory Panel, DOES NOT test for the novel  Coronavirus (2019 nCoV)    Coronavirus HKU1 NOT DETECTED NOT DETECTED Final   Coronavirus NL63 NOT DETECTED NOT DETECTED Final   Coronavirus OC43 NOT DETECTED NOT DETECTED Final   Metapneumovirus NOT DETECTED NOT DETECTED Final   Rhinovirus / Enterovirus NOT DETECTED NOT DETECTED Final   Influenza A NOT DETECTED NOT DETECTED Final   Influenza B NOT DETECTED NOT DETECTED Final   Parainfluenza Virus 1 NOT DETECTED NOT DETECTED Final   Parainfluenza Virus 2 NOT DETECTED NOT DETECTED Final   Parainfluenza Virus 3 NOT DETECTED NOT DETECTED Final   Parainfluenza Virus 4 NOT DETECTED NOT DETECTED Final    Respiratory Syncytial Virus NOT DETECTED NOT DETECTED Final   Bordetella pertussis NOT DETECTED NOT DETECTED Final   Bordetella Parapertussis NOT DETECTED NOT DETECTED Final   Chlamydophila pneumoniae NOT DETECTED NOT DETECTED Final   Mycoplasma pneumoniae NOT DETECTED NOT DETECTED Final    Comment: Performed at [facility], [address] [place], [place] [address]  Urine Culture     Status: None   Collection Time: 05/15/22  3:33 PM   Specimen: Urine, Random  Result Value Ref Range Status   Specimen Description   Final    URINE, RANDOM Performed at [facility], [address], [place], [place] [address]    Special Requests URINE, CLEAN CATCH  Final  Culture   Final    NO GROWTH Performed at [facility], [address], [place], [place] [address]    Report Status 05/17/2022 FINAL  Final  Culture, blood (Routine X 2) w Reflex to ID Panel     Status: Abnormal   Collection Time: 05/15/22  4:01 PM   Specimen: BLOOD  Result Value Ref Range Status   Specimen Description   Final    BLOOD LEFT ANTECUBITAL Performed at [facility], [address] [place], [place] [address]    Special Requests   Final    BOTTLES DRAWN AEROBIC AND ANAEROBIC Blood Culture adequate volume Performed at [facility], [address] [place], [place] [address]    Culture  Setup Time   Final    GRAM NEGATIVE RODS IN BOTH AEROBIC AND ANAEROBIC BOTTLES CRITICAL RESULT CALLED TO, READ BACK BY AND VERIFIED WITH: JASON ROBBINS 0257 05/16/22 LFD    Culture (A)  Final    ESCHERICHIA COLI SUSCEPTIBILITIES PERFORMED ON PREVIOUS CULTURE WITHIN THE LAST 5 DAYS. Performed at [facility], [address], [place], [place] [address]    Report Status 05/18/2022 FINAL  Final  Culture, blood (Routine X 2) w Reflex to ID Panel     Status: Abnormal   Collection Time: 05/15/22  4:03 PM   Specimen: BLOOD  Result Value Ref Range Status   Specimen Description   Final     BLOOD RIGHT ANTECUBITAL Performed at [facility], [address], [place], [place] [address]    Special Requests   Final    BOTTLES DRAWN AEROBIC AND ANAEROBIC Blood Culture adequate volume Performed at [facility], [address] [place], [place] [address]    Culture  Setup Time   Final    GRAM NEGATIVE RODS IN BOTH AEROBIC AND ANAEROBIC BOTTLES CRITICAL RESULT CALLED TO, READ BACK BY AND VERIFIED WITH: JASON ROBBINS @ 0257 05/16/22 LFD    Culture ESCHERICHIA COLI (A)  Final   Report Status 05/18/2022 FINAL  Final   Organism ID, Bacteria ESCHERICHIA COLI  Final      Susceptibility   Escherichia coli - MIC*    AMPICILLIN <=2 SENSITIVE Sensitive     CEFEPIME <=0.12 SENSITIVE Sensitive     CEFTAZIDIME <=1 SENSITIVE Sensitive     CEFTRIAXONE <=0.25 SENSITIVE Sensitive     CIPROFLOXACIN <=0.25 SENSITIVE Sensitive     GENTAMICIN <=1 SENSITIVE Sensitive     IMIPENEM <=0.25 SENSITIVE Sensitive     TRIMETH/SULFA <=20 SENSITIVE Sensitive     AMPICILLIN/SULBACTAM <=2 SENSITIVE Sensitive     PIP/TAZO <=4 SENSITIVE Sensitive     * ESCHERICHIA COLI  Blood Culture ID Panel (Reflexed)     Status: Abnormal (Preliminary result)   Collection Time: 05/15/22  4:03 PM  Result Value Ref Range Status   Enterococcus faecalis NOT DETECTED NOT DETECTED Final   Enterococcus Faecium NOT DETECTED NOT DETECTED Final   Listeria monocytogenes NOT DETECTED NOT DETECTED Final   Staphylococcus species NOT DETECTED NOT DETECTED Final   Staphylococcus aureus (BCID) PENDING NOT DETECTED Incomplete   Staphylococcus epidermidis PENDING NOT DETECTED Incomplete   Staphylococcus lugdunensis PENDING NOT DETECTED Incomplete   Streptococcus species NOT DETECTED NOT DETECTED Final   Streptococcus agalactiae NOT DETECTED NOT DETECTED Final   Streptococcus pneumoniae NOT DETECTED NOT DETECTED Final   Streptococcus pyogenes NOT DETECTED NOT DETECTED Final   A.calcoaceticus-baumannii NOT DETECTED NOT  DETECTED Final   Bacteroides fragilis NOT DETECTED NOT  DETECTED Final   Enterobacterales DETECTED (A) NOT DETECTED Final    Comment: Enterobacterales represent a large order of gram negative bacteria, not a single organism. CRITICAL RESULT CALLED TO, READ BACK BY AND VERIFIED WITH: JASON ROBBINS @ 0257 05/16/22 LFD    Enterobacter cloacae complex NOT DETECTED NOT DETECTED Final   Escherichia coli DETECTED (A) NOT DETECTED Final    Comment: CRITICAL RESULT CALLED TO, READ BACK BY AND VERIFIED WITH: JASON ROBBINS @ 05/16/22 0257 LFD    Klebsiella aerogenes NOT DETECTED NOT DETECTED Final   Klebsiella oxytoca NOT DETECTED NOT DETECTED Final   Klebsiella pneumoniae NOT DETECTED NOT DETECTED Final   Proteus species NOT DETECTED NOT DETECTED Final   Salmonella species NOT DETECTED NOT DETECTED Final   Serratia marcescens NOT DETECTED NOT DETECTED Final   Haemophilus influenzae NOT DETECTED NOT DETECTED Final   Neisseria meningitidis NOT DETECTED NOT DETECTED Final   Pseudomonas aeruginosa NOT DETECTED NOT DETECTED Final   Stenotrophomonas maltophilia NOT DETECTED NOT DETECTED Final   Candida albicans NOT DETECTED NOT DETECTED Final   Candida auris NOT DETECTED NOT DETECTED Final   Candida glabrata NOT DETECTED NOT DETECTED Final   Candida krusei NOT DETECTED NOT DETECTED Final   Candida parapsilosis NOT DETECTED NOT DETECTED Final   Candida tropicalis NOT DETECTED NOT DETECTED Final   Cryptococcus neoformans/gattii NOT DETECTED NOT DETECTED Final   CTX-M ESBL NOT DETECTED NOT DETECTED Final   Carbapenem resistance IMP NOT DETECTED NOT DETECTED Final   Carbapenem resistance KPC NOT DETECTED NOT DETECTED Final   Carbapenem resistance NDM NOT DETECTED NOT DETECTED Final   Carbapenem resist OXA 48 LIKE NOT DETECTED NOT DETECTED Final   Carbapenem resistance VIM NOT DETECTED NOT DETECTED Final    Comment: Performed at [facility], [address] [place], [place] [address]     Radiology Studies: No results found.  Scheduled Meds:  amLODipine  5 mg Oral Daily   losartan  50 mg Oral Daily   QUEtiapine  25 mg Oral QHS   senna-docusate  2 tablet Oral BID   Continuous Infusions:   LOS: 32 days    Time spent: 50 mins    Willeen Niece, MD [facility]   If 7PM-7AM, please contact night-coverage

## 2022-05-25 NOTE — TOC Progression Note (Signed)
Transition of Care [facility]) - Progression Note    Patient Details  Name: Madeline Taylor MRN: [id number] Date of Birth: [birth year]  Transition of [facility] CM/SW Contact  Darleene Cleaver, [place] Phone Number: 05/25/2022, 12:11 PM  Clinical Narrative:     CSW attempted to contact [facility] to see if they can review the referral.  Unable to leave a message on voice mail.  TOC to continue to follow patient's progress throughout discharge planning.   Expected Discharge Plan: Long Term Nursing Home Barriers to Discharge: No SNF bed  Expected Discharge Plan and Services       Living arrangements for the past 2 months: Single Family Home                 DME Arranged: Walker rolling DME Agency: [employer] Date DME Agency Contacted: 04/25/22 Time DME Agency Contacted: [phone] Representative spoke with at DME Agency: [name]             Social Determinants of Health (SDOH) Interventions SDOH Screenings   Food Insecurity: Patient Unable To Answer (04/23/2022)  Housing: Low Risk  (04/23/2022)  Transportation Needs: No Transportation Needs (04/23/2022)  Utilities: Not At Risk (04/23/2022)  Alcohol Screen: Low Risk  (05/21/2021)  Depression (PHQ2-9): Low Risk  (05/21/2021)  Financial Resource Strain: Low Risk  (05/21/2021)  Physical Activity: Inactive (05/21/2021)  Social Connections: Moderately Integrated (05/21/2021)  Stress: No Stress Concern Present (05/21/2021)  Tobacco Use: Low Risk  (04/23/2022)    Readmission Risk Interventions     No data to display

## 2022-05-25 NOTE — Plan of Care (Signed)
  Problem: Clinical Measurements: Goal: Ability to maintain clinical measurements within normal limits will improve Outcome: Progressing   Problem: Clinical Measurements: Goal: Will remain free from infection Outcome: Progressing   Problem: Activity: Goal: Risk for activity intolerance will decrease Outcome: Progressing   

## 2022-05-25 NOTE — Plan of Care (Signed)

## 2022-05-26 DIAGNOSIS — B9620 Unspecified Escherichia coli [E. coli] as the cause of diseases classified elsewhere: Secondary | ICD-10-CM | POA: Diagnosis not present

## 2022-05-26 DIAGNOSIS — R7881 Bacteremia: Secondary | ICD-10-CM | POA: Diagnosis not present

## 2022-05-26 NOTE — Plan of Care (Signed)

## 2022-05-26 NOTE — Progress Notes (Signed)
PROGRESS NOTE    Madeline Taylor  ZOX:[id number] DOB: [birth year] DOA: 04/22/2022 PCP: [name], NP   Brief Narrative:  This 87 yrs old female with medical history significant of dementia, hypertension, history of TIAs presenting with encephalopathy, hypothermia . Per daughter, patient had worsening dementia, frequently Lost walking out of the house.  But normally has good appetite.  For the last week, patient had significant increased agitation, has not been sleeping well  Assessment & Plan:   Principal Problem:   Bacteremia due to Escherichia coli Active Problems:   Pancreatic pseudocyst   Essential hypertension   Vascular dementia (HCC)   History of TIA (transient ischemic attack)   Hypothermia   Osteoarthritis of multiple joints   Metabolic acidosis   Dementia with behavioral disturbance (HCC)   Facial bruising, sequela   Hyponatremia   E. coli bacteremia:  She presented with AMS, and fever of 102 on 05/15/22.   Blood cultures positive for E. coli. Likely urine source with urinalysis positive.  She was started empirically on Rocephin on 4/27.    Urine culture was sent and antibiotics were started on 4/27.   Urine analysis was positive.  Still suspect a urine source.   Patient has completed antibiotics course.   Pancreatic pseudocyst: Seen on CT scan of the abdomen without contrast.  CT scan pancreatic protocol showed Pancreatic Pseudocyst. No evidence of neoplasm. CA 19-9--wnl and CA-125--wnl   Vascular dementia: Patient has Dementia with behavioral disturbance.  Continue Seroquel at night.  Risperdal as needed during the day only if needed.   Patient is much calmer and cooperative and answers questions.  Patient has not been wandering.  Sleeping well with Seroquel at night.   Has not had any behavioral disturbance per staff. Quiet pleasant   Essential hypertension Continue amlodipine and losartan   History of TIA (transient ischemic attack) MRI brain was  negative for acute intracranial abnormality.   Hypothermia Resolved.   Hyponatremia Resolved.   Facial bruising, sequela Bruising around the left eye upper and lower lids and left frontal hematoma.   Metabolic acidosis Improved / Resolved.   Overall medically stable.  TOC is working with daughter regarding discharge plan. Patient has two places who has offered bed. Awaiting response from daughter. Patient is medically stable for discharge since 05/19/22   DVT prophylaxis: SCDs Code Status: Full code Family Communication:No family at bed side. Disposition Plan:   Status is: Inpatient Remains inpatient appropriate because: Admitted for bacteremia due to E. coli.  TOC is working on memory care placement.  Consultants:   None  Procedures:None  Antimicrobials: Anti-infectives (From admission, onward)    Start     Dose/Rate Route Frequency Ordered Stop   05/20/22 0600  amoxicillin (AMOXIL) capsule 1,000 mg        1,000 mg Oral Every 8 hours 05/19/22 1515 05/21/22 2117   05/18/22 1800  ceFAZolin (ANCEF) IVPB 2g/100 mL premix        2 g 200 mL/hr over 30 Minutes Intravenous Every 8 hours 05/18/22 1338 05/19/22 2213   05/16/22 1000  azithromycin (ZITHROMAX) tablet 250 mg  Status:  Discontinued       See Hyperspace for full Linked Orders Report.   250 mg Oral Daily 05/15/22 1631 05/16/22 0258   05/15/22 1800  cefTRIAXone (ROCEPHIN) 2 g in sodium chloride 0.9 % 100 mL IVPB  Status:  Discontinued        2 g 200 mL/hr over 30 Minutes Intravenous Every 24 hours  05/15/22 1631 05/18/22 1338   05/15/22 1730  azithromycin (ZITHROMAX) tablet 500 mg       See Hyperspace for full Linked Orders Report.   500 mg Oral Daily 05/15/22 1631 05/15/22 1805   04/24/22 1200  vancomycin (VANCOREADY) IVPB 500 mg/100 mL  Status:  Discontinued        500 mg 100 mL/hr over 60 Minutes Intravenous Every 24 hours 04/23/22 1206 04/24/22 1212   04/23/22 1330  vancomycin (VANCOCIN) IVPB 1000 mg/200 mL premix         1,000 mg 200 mL/hr over 60 Minutes Intravenous  Once 04/23/22 1206 04/23/22 1731   04/23/22 1300  ceFEPIme (MAXIPIME) 2 g in sodium chloride 0.9 % 100 mL IVPB  Status:  Discontinued        2 g 200 mL/hr over 30 Minutes Intravenous Every 12 hours 04/23/22 1206 04/24/22 1212      Subjective: Patient was seen and examined at bedside.  Overnight events noted.  Patient appears quite calm,  cooperative and pleasant.  Denies any concerns.  Objective: Vitals:   05/25/22 0859 05/25/22 1702 05/26/22 0010 05/26/22 0742  BP: 130/67 (!) 166/66 (!) 149/63 (!) 156/66  Pulse: 69 61 (!) 56 62  Resp: 19 20 16 16   Temp: 98 F (36.7 C) 98.4 F (36.9 C) 97.8 F (36.6 C) 98.5 F (36.9 C)  TempSrc: Oral Oral    SpO2: 98% 100% 95% 100%  Weight:      Height:       No intake or output data in the 24 hours ending 05/26/22 1354  Filed Weights   04/22/22 1757  Weight: 49.4 kg    Examination:  General exam: Appears calm and comfortable, not in any acute distress. Respiratory system: CTA bilaterally. Respiratory effort normal.  RR 15 Cardiovascular system: S1 & S2 heard, regular rate and rhythm, no murmur. Gastrointestinal system: Abdomen is soft, non tender, non distended, BS+ Central nervous system: Alert and oriented x 2. No focal neurological deficits. Extremities: No edema, no cyanosis, no clubbing. Skin: No rashes, lesions or ulcers Psychiatry: Judgement and insight appear normal. Mood & affect appropriate.     Data Reviewed: I have personally reviewed following labs and imaging studies  CBC: No results for input(s): "WBC", "NEUTROABS", "HGB", "HCT", "MCV", "PLT" in the last 168 hours.  Basic Metabolic Panel: No results for input(s): "NA", "K", "CL", "CO2", "GLUCOSE", "BUN", "CREATININE", "CALCIUM", "MG", "PHOS" in the last 168 hours. GFR: Estimated Creatinine Clearance: 35.7 mL/min (by C-G formula based on SCr of 0.76 mg/dL). Liver Function Tests: No results for input(s):  "AST", "ALT", "ALKPHOS", "BILITOT", "PROT", "ALBUMIN" in the last 168 hours. No results for input(s): "LIPASE", "AMYLASE" in the last 168 hours. No results for input(s): "AMMONIA" in the last 168 hours. Coagulation Profile: No results for input(s): "INR", "PROTIME" in the last 168 hours. Cardiac Enzymes: No results for input(s): "CKTOTAL", "CKMB", "CKMBINDEX", "TROPONINI" in the last 168 hours. BNP (last 3 results) No results for input(s): "PROBNP" in the last 8760 hours. HbA1C: No results for input(s): "HGBA1C" in the last 72 hours. CBG: No results for input(s): "GLUCAP" in the last 168 hours. Lipid Profile: No results for input(s): "CHOL", "HDL", "LDLCALC", "TRIG", "CHOLHDL", "LDLDIRECT" in the last 72 hours. Thyroid Function Tests: No results for input(s): "TSH", "T4TOTAL", "FREET4", "T3FREE", "THYROIDAB" in the last 72 hours. Anemia Panel: No results for input(s): "VITAMINB12", "FOLATE", "FERRITIN", "TIBC", "IRON", "RETICCTPCT" in the last 72 hours. Sepsis Labs: No results for input(s): "PROCALCITON", "LATICACIDVEN" in  the last 168 hours.  No results found for this or any previous visit (from the past 240 hour(s)).   Radiology Studies: No results found.  Scheduled Meds:  amLODipine  5 mg Oral Daily   losartan  50 mg Oral Daily   QUEtiapine  25 mg Oral QHS   senna-docusate  2 tablet Oral BID   Continuous Infusions:   LOS: 33 days    Time spent: 35 mins    Willeen Niece, MD [facility]   If 7PM-7AM, please contact night-coverage

## 2022-05-27 DIAGNOSIS — B9620 Unspecified Escherichia coli [E. coli] as the cause of diseases classified elsewhere: Secondary | ICD-10-CM | POA: Diagnosis not present

## 2022-05-27 DIAGNOSIS — R7881 Bacteremia: Secondary | ICD-10-CM | POA: Diagnosis not present

## 2022-05-27 NOTE — Progress Notes (Signed)
Mobility Specialist - Progress Note   05/27/22 1024  Mobility  Activity Ambulated with assistance in room  Level of Assistance Standby assist, set-up cues, supervision of patient - no hands on  Assistive Device None  Distance Ambulated (ft) 40 ft  Activity Response Tolerated well  $Mobility charge 1 Mobility  Mobility Specialist Start Time (ACUTE ONLY) 0950  Mobility Specialist Stop Time (ACUTE ONLY) 1008  Mobility Specialist Time Calculation (min) (ACUTE ONLY) 18 min   Zetta Bills Mobility Specialist 05/27/22 10:26 AM

## 2022-05-27 NOTE — Progress Notes (Signed)
PROGRESS NOTE    Madeline Taylor  ZOX:[id number] DOB: Oct 18, 1935 DOA: 04/22/2022 PCP: [name], NP   Brief Narrative:  This 87 yrs old female with medical history significant of dementia, hypertension, history of TIAs presenting with encephalopathy, hypothermia . Per daughter, patient had worsening dementia, frequently Lost walking out of the house.  But normally has good appetite.  For the last week, patient had significant increased agitation, has not been sleeping well  Assessment & Plan:   Principal Problem:   Bacteremia due to Escherichia coli Active Problems:   Pancreatic pseudocyst   Essential hypertension   Vascular dementia (HCC)   History of TIA (transient ischemic attack)   Hypothermia   Osteoarthritis of multiple joints   Metabolic acidosis   Dementia with behavioral disturbance (HCC)   Facial bruising, sequela   Hyponatremia   E. coli bacteremia:  She presented with AMS, and fever of 102 on 05/15/22.   Blood cultures positive for E. coli. Likely urine source with urinalysis positive.  She was started empirically on Rocephin on 4/27.    Urine culture was sent and antibiotics were started on 4/27.   Urine analysis was positive. Still suspect a urinary source.   Patient has completed antibiotics course.   Pancreatic pseudocyst: Seen on CT scan of the abdomen without contrast.  CT scan pancreatic protocol showed Pancreatic Pseudocyst. No evidence of neoplasm. CA 19-9--wnl and CA-125--wnl   Vascular dementia: Patient has Dementia with behavioral disturbance.  Continue Seroquel at night.  Risperdal as needed during the day only if needed.   Patient is much calmer and cooperative and answers questions.  Patient has not been wandering.  Sleeping well with Seroquel at night.   Has not had any behavioral disturbance per staff. Quiet pleasant   Essential hypertension Continue amlodipine and losartan   History of TIA (transient ischemic attack) MRI brain was  negative for acute intracranial abnormality.   Hypothermia Resolved.   Hyponatremia Resolved.   Facial bruising, sequela Bruising around the left eye upper and lower lids and left frontal hematoma.   Metabolic acidosis Improved / Resolved.   Overall medically stable.  TOC is working with daughter regarding discharge plan. Patient has two places who has offered bed. Awaiting response from daughter. Patient is medically stable for discharge since 05/19/22   DVT prophylaxis: SCDs Code Status: Full code Family Communication:No family at bed side. Disposition Plan:   Status is: Inpatient Remains inpatient appropriate because: Admitted for bacteremia due to E. coli.  TOC is working on memory care placement.  Consultants:   None  Procedures:None  Antimicrobials: Anti-infectives (From admission, onward)    Start     Dose/Rate Route Frequency Ordered Stop   05/20/22 0600  amoxicillin (AMOXIL) capsule 1,000 mg        1,000 mg Oral Every 8 hours 05/19/22 1515 05/21/22 2117   05/18/22 1800  ceFAZolin (ANCEF) IVPB 2g/100 mL premix        2 g 200 mL/hr over 30 Minutes Intravenous Every 8 hours 05/18/22 1338 05/19/22 2213   05/16/22 1000  azithromycin (ZITHROMAX) tablet 250 mg  Status:  Discontinued       See Hyperspace for full Linked Orders Report.   250 mg Oral Daily 05/15/22 1631 05/16/22 0258   05/15/22 1800  cefTRIAXone (ROCEPHIN) 2 g in sodium chloride 0.9 % 100 mL IVPB  Status:  Discontinued        2 g 200 mL/hr over 30 Minutes Intravenous Every 24 hours 05/15/22  1631 05/18/22 1338   05/15/22 1730  azithromycin (ZITHROMAX) tablet 500 mg       See Hyperspace for full Linked Orders Report.   500 mg Oral Daily 05/15/22 1631 05/15/22 1805   04/24/22 1200  vancomycin (VANCOREADY) IVPB 500 mg/100 mL  Status:  Discontinued        500 mg 100 mL/hr over 60 Minutes Intravenous Every 24 hours 04/23/22 1206 04/24/22 1212   04/23/22 1330  vancomycin (VANCOCIN) IVPB 1000 mg/200 mL premix         1,000 mg 200 mL/hr over 60 Minutes Intravenous  Once 04/23/22 1206 04/23/22 1731   04/23/22 1300  ceFEPIme (MAXIPIME) 2 g in sodium chloride 0.9 % 100 mL IVPB  Status:  Discontinued        2 g 200 mL/hr over 30 Minutes Intravenous Every 12 hours 04/23/22 1206 04/24/22 1212      Subjective: Patient was seen and examined at bedside.  Overnight events noted.  Patient appears calm, cooperative, quiet, pleasant and denies any concerns.  Objective: Vitals:   05/26/22 0742 05/26/22 1510 05/26/22 2305 05/27/22 1106  BP: (!) 156/66 (!) 152/70 126/68 (!) 156/67  Pulse: 62 74 73 64  Resp: 16 18 17 14   Temp: 98.5 F (36.9 C) 98.6 F (37 C) 98.1 F (36.7 C)   TempSrc:      SpO2: 100% 99% 99% 100%  Weight:      Height:        Intake/Output Summary (Last 24 hours) at 05/27/2022 1411 Last data filed at 05/27/2022 1337 Gross per 24 hour  Intake 720 ml  Output --  Net 720 ml    Filed Weights   04/22/22 1757  Weight: 49.4 kg    Examination:  General exam: Appears calm and comfortable, not in any acute distress. Respiratory system: CTA bilaterally. Respiratory effort normal.  RR 15 Cardiovascular system: S1 & S2 heard, regular rate and rhythm, no murmur. Gastrointestinal system: Abdomen is soft, non tender, non distended, BS+ Central nervous system: Alert and oriented x 2. No focal neurological deficits. Extremities: No edema, no cyanosis, no clubbing. Skin: No rashes, lesions or ulcers Psychiatry: Judgement and insight appear normal. Mood & affect appropriate.     Data Reviewed: I have personally reviewed following labs and imaging studies  CBC: No results for input(s): "WBC", "NEUTROABS", "HGB", "HCT", "MCV", "PLT" in the last 168 hours.  Basic Metabolic Panel: No results for input(s): "NA", "K", "CL", "CO2", "GLUCOSE", "BUN", "CREATININE", "CALCIUM", "MG", "PHOS" in the last 168 hours. GFR: Estimated Creatinine Clearance: 35.7 mL/min (by C-G formula based on SCr of  0.76 mg/dL). Liver Function Tests: No results for input(s): "AST", "ALT", "ALKPHOS", "BILITOT", "PROT", "ALBUMIN" in the last 168 hours. No results for input(s): "LIPASE", "AMYLASE" in the last 168 hours. No results for input(s): "AMMONIA" in the last 168 hours. Coagulation Profile: No results for input(s): "INR", "PROTIME" in the last 168 hours. Cardiac Enzymes: No results for input(s): "CKTOTAL", "CKMB", "CKMBINDEX", "TROPONINI" in the last 168 hours. BNP (last 3 results) No results for input(s): "PROBNP" in the last 8760 hours. HbA1C: No results for input(s): "HGBA1C" in the last 72 hours. CBG: No results for input(s): "GLUCAP" in the last 168 hours. Lipid Profile: No results for input(s): "CHOL", "HDL", "LDLCALC", "TRIG", "CHOLHDL", "LDLDIRECT" in the last 72 hours. Thyroid Function Tests: No results for input(s): "TSH", "T4TOTAL", "FREET4", "T3FREE", "THYROIDAB" in the last 72 hours. Anemia Panel: No results for input(s): "VITAMINB12", "FOLATE", "FERRITIN", "TIBC", "IRON", "RETICCTPCT" in  the last 72 hours. Sepsis Labs: No results for input(s): "PROCALCITON", "LATICACIDVEN" in the last 168 hours.  No results found for this or any previous visit (from the past 240 hour(s)).   Radiology Studies: No results found.  Scheduled Meds:  amLODipine  5 mg Oral Daily   losartan  50 mg Oral Daily   QUEtiapine  25 mg Oral QHS   senna-docusate  2 tablet Oral BID   Continuous Infusions:   LOS: 34 days    Time spent: 35 mins    Willeen Niece, MD [facility]   If 7PM-7AM, please contact night-coverage

## 2022-05-27 NOTE — Plan of Care (Signed)
Patient A&Ox1-2, from home, independent in room and in hallway with SBA, 1:1 sitter continued

## 2022-05-27 NOTE — Plan of Care (Signed)
  Problem: Skin Integrity: Goal: Risk for impaired skin integrity will decrease Outcome: Progressing   Problem: Safety: Goal: Ability to remain free from injury will improve Outcome: Progressing   Problem: Elimination: Goal: Will not experience complications related to bowel motility Outcome: Progressing Goal: Will not experience complications related to urinary retention Outcome: Progressing   Problem: Activity: Goal: Risk for activity intolerance will decrease Outcome: Progressing   Problem: Nutrition: Goal: Adequate nutrition will be maintained Outcome: Progressing   Problem: Coping: Goal: Level of anxiety will decrease Outcome: Progressing

## 2022-05-28 DIAGNOSIS — R7881 Bacteremia: Secondary | ICD-10-CM | POA: Diagnosis not present

## 2022-05-28 DIAGNOSIS — B9620 Unspecified Escherichia coli [E. coli] as the cause of diseases classified elsewhere: Secondary | ICD-10-CM | POA: Diagnosis not present

## 2022-05-28 NOTE — Progress Notes (Signed)
   05/28/22 1000  Spiritual Encounters  Type of Visit Initial  Care provided to: Patient  Conversation partners present during encounter Nurse  Referral source Chaplain assessment  Reason for visit Routine spiritual support  OnCall Visit No  Spiritual Framework  Presenting Themes Meaning/purpose/sources of inspiration  Community/Connection Family;Spiritual leader  Strengths Faith, Family  Patient Stress Factors Not reviewed  Family Stress Factors Not reviewed  Interventions  Spiritual Care Interventions Made Established relationship of care and support;Compassionate presence;Reflective listening;Prayer  Intervention Outcomes  Outcomes Reduced anxiety;Reduced fear  Spiritual Care Plan  Spiritual Care Issues Still Outstanding No further spiritual care needs at this time (see row info)   Prayer for patient and ask for prayer for her family. Patient seem to be in good spirit family was coming to visit them. Advise patient's that a chaplain is here 24/7 if they need one.

## 2022-05-28 NOTE — Plan of Care (Signed)
Patient A&Ox1-2, from home, independent in room and hallway with assist, sitter continued.

## 2022-05-28 NOTE — Progress Notes (Signed)
PROGRESS NOTE    Madeline Taylor  ZOX:[id number] DOB: 14-Jun-1935 DOA: 04/22/2022 PCP: [name], NP   Brief Narrative:  This 87 yrs old female with medical history significant of dementia, hypertension, history of TIAs presenting with encephalopathy, hypothermia . Per daughter, patient had worsening dementia, frequently Lost walking out of the house.  But normally has good appetite.  For the last week, patient had significant increased agitation, has not been sleeping well  Assessment & Plan:   Principal Problem:   Bacteremia due to Escherichia coli Active Problems:   Pancreatic pseudocyst   Essential hypertension   Vascular dementia (HCC)   History of TIA (transient ischemic attack)   Hypothermia   Osteoarthritis of multiple joints   Metabolic acidosis   Dementia with behavioral disturbance (HCC)   Facial bruising, sequela   Hyponatremia   E. coli bacteremia:  She presented with AMS, and fever of 102 on 05/15/22.   Blood cultures positive for E. coli. Likely urine source with urinalysis positive.  She was started empirically on Rocephin on 4/27.    Urine culture was sent and antibiotics were started on 4/27.   Urine analysis was positive. Still suspect a urinary source.   Patient has completed antibiotics course.   Pancreatic pseudocyst: Seen on CT scan of the abdomen without contrast.  CT scan pancreatic protocol showed Pancreatic Pseudocyst. No evidence of neoplasm. CA 19-9--wnl and CA-125--wnl   Vascular dementia: Patient has Dementia with behavioral disturbance.  Continue Seroquel at night.  Risperdal as needed during the day only if needed.   Patient is much calmer and cooperative and answers questions.  Patient has not been wandering.  Sleeping well with Seroquel at night.   Has not had any behavioral disturbance per staff. Quiet pleasant   Essential hypertension: Continue amlodipine and losartan.   History of TIA (transient ischemic attack) MRI brain was  negative for acute intracranial abnormality.   Hypothermia Resolved.   Hyponatremia Resolved.   Facial bruising, sequela Bruising around the left eye upper and lower lids and left frontal hematoma.   Metabolic acidosis Improved / Resolved.   Overall medically stable.  TOC is working with daughter regarding discharge plan. Patient has two places who has offered bed. Awaiting response from daughter. Patient is medically stable for discharge since 05/19/22   DVT prophylaxis: SCDs Code Status: Full code Family Communication:No family at bed side. Disposition Plan:   Status is: Inpatient Remains inpatient appropriate because: Admitted for bacteremia due to E. coli.  TOC is working on memory care placement.  Consultants:   None  Procedures:None  Antimicrobials: Anti-infectives (From admission, onward)    Start     Dose/Rate Route Frequency Ordered Stop   05/20/22 0600  amoxicillin (AMOXIL) capsule 1,000 mg        1,000 mg Oral Every 8 hours 05/19/22 1515 05/21/22 2117   05/18/22 1800  ceFAZolin (ANCEF) IVPB 2g/100 mL premix        2 g 200 mL/hr over 30 Minutes Intravenous Every 8 hours 05/18/22 1338 05/19/22 2213   05/16/22 1000  azithromycin (ZITHROMAX) tablet 250 mg  Status:  Discontinued       See Hyperspace for full Linked Orders Report.   250 mg Oral Daily 05/15/22 1631 05/16/22 0258   05/15/22 1800  cefTRIAXone (ROCEPHIN) 2 g in sodium chloride 0.9 % 100 mL IVPB  Status:  Discontinued        2 g 200 mL/hr over 30 Minutes Intravenous Every 24 hours 05/15/22  1631 05/18/22 1338   05/15/22 1730  azithromycin (ZITHROMAX) tablet 500 mg       See Hyperspace for full Linked Orders Report.   500 mg Oral Daily 05/15/22 1631 05/15/22 1805   04/24/22 1200  vancomycin (VANCOREADY) IVPB 500 mg/100 mL  Status:  Discontinued        500 mg 100 mL/hr over 60 Minutes Intravenous Every 24 hours 04/23/22 1206 04/24/22 1212   04/23/22 1330  vancomycin (VANCOCIN) IVPB 1000 mg/200 mL premix         1,000 mg 200 mL/hr over 60 Minutes Intravenous  Once 04/23/22 1206 04/23/22 1731   04/23/22 1300  ceFEPIme (MAXIPIME) 2 g in sodium chloride 0.9 % 100 mL IVPB  Status:  Discontinued        2 g 200 mL/hr over 30 Minutes Intravenous Every 12 hours 04/23/22 1206 04/24/22 1212      Subjective: Patient was seen and examined at bedside.  Overnight events noted.  Appears calm, cooperative,  quite pleasant and denies any concerns.   She asks she is going to have a shower.   Objective: Vitals:   05/26/22 2305 05/27/22 1106 05/27/22 1547 05/28/22 0858  BP: 126/68 (!) 156/67 (!) 160/86 139/64  Pulse: 73 64 82 80  Resp: 17 14 17 17   Temp: 98.1 F (36.7 C)  98.2 F (36.8 C) 98.2 F (36.8 C)  TempSrc:      SpO2: 99% 100% 100% 100%  Weight:      Height:        Intake/Output Summary (Last 24 hours) at 05/28/2022 1125 Last data filed at 05/28/2022 0849 Gross per 24 hour  Intake 600 ml  Output --  Net 600 ml    Filed Weights   04/22/22 1757  Weight: 49.4 kg    Examination:  General exam: Appears calm and comfortable, not in any acute distress. Respiratory system: CTA bilaterally. Respiratory effort normal.  RR 15 Cardiovascular system: S1 & S2 heard, regular rate and rhythm, no murmur. Gastrointestinal system: Abdomen is soft, non tender, non distended, BS+ Central nervous system: Alert and oriented x 2, no focal neurological deficits. Extremities: No edema, no cyanosis, no clubbing. Skin: No rashes, lesions or ulcers Psychiatry: Judgement and insight appear normal. Mood & affect appropriate.     Data Reviewed: I have personally reviewed following labs and imaging studies  CBC: No results for input(s): "WBC", "NEUTROABS", "HGB", "HCT", "MCV", "PLT" in the last 168 hours.  Basic Metabolic Panel: No results for input(s): "NA", "K", "CL", "CO2", "GLUCOSE", "BUN", "CREATININE", "CALCIUM", "MG", "PHOS" in the last 168 hours. GFR: Estimated Creatinine Clearance: 35.7  mL/min (by C-G formula based on SCr of 0.76 mg/dL). Liver Function Tests: No results for input(s): "AST", "ALT", "ALKPHOS", "BILITOT", "PROT", "ALBUMIN" in the last 168 hours. No results for input(s): "LIPASE", "AMYLASE" in the last 168 hours. No results for input(s): "AMMONIA" in the last 168 hours. Coagulation Profile: No results for input(s): "INR", "PROTIME" in the last 168 hours. Cardiac Enzymes: No results for input(s): "CKTOTAL", "CKMB", "CKMBINDEX", "TROPONINI" in the last 168 hours. BNP (last 3 results) No results for input(s): "PROBNP" in the last 8760 hours. HbA1C: No results for input(s): "HGBA1C" in the last 72 hours. CBG: No results for input(s): "GLUCAP" in the last 168 hours. Lipid Profile: No results for input(s): "CHOL", "HDL", "LDLCALC", "TRIG", "CHOLHDL", "LDLDIRECT" in the last 72 hours. Thyroid Function Tests: No results for input(s): "TSH", "T4TOTAL", "FREET4", "T3FREE", "THYROIDAB" in the last 72 hours. Anemia Panel:  No results for input(s): "VITAMINB12", "FOLATE", "FERRITIN", "TIBC", "IRON", "RETICCTPCT" in the last 72 hours. Sepsis Labs: No results for input(s): "PROCALCITON", "LATICACIDVEN" in the last 168 hours.  No results found for this or any previous visit (from the past 240 hour(s)).   Radiology Studies: No results found.  Scheduled Meds:  amLODipine  5 mg Oral Daily   losartan  50 mg Oral Daily   QUEtiapine  25 mg Oral QHS   senna-docusate  2 tablet Oral BID   Continuous Infusions:   LOS: 35 days    Time spent: 35 mins    Willeen Niece, MD [facility]   If 7PM-7AM, please contact night-coverage

## 2022-05-28 NOTE — TOC Progression Note (Signed)
Transition of Care [facility]) - Progression Note    Patient Details  Name: Madeline Taylor MRN: [id number] Date of Birth: 1935-03-02  Transition of Care [facility]) CM/SW Contact  Darleene Cleaver, [place] Phone Number: 05/27/2022, 10:58 AM  Clinical Narrative:     CSW attempted to contact [facility] again unable to leave a message.  CSW attempted to call patient's daughter left a message waiting for phone call back.   Expected Discharge Plan: Long Term Nursing Home Barriers to Discharge: No SNF bed  Expected Discharge Plan and Services       Living arrangements for the past 2 months: Single Family Home                 DME Arranged: Walker rolling DME Agency: [employer] Date DME Agency Contacted: 04/25/22 Time DME Agency Contacted: [phone] Representative spoke with at DME Agency: [name]             Social Determinants of Health (SDOH) Interventions SDOH Screenings   Food Insecurity: Patient Unable To Answer (04/23/2022)  Housing: Low Risk  (04/23/2022)  Transportation Needs: No Transportation Needs (04/23/2022)  Utilities: Not At Risk (04/23/2022)  Alcohol Screen: Low Risk  (05/21/2021)  Depression (PHQ2-9): Low Risk  (05/21/2021)  Financial Resource Strain: Low Risk  (05/21/2021)  Physical Activity: Inactive (05/21/2021)  Social Connections: Moderately Integrated (05/21/2021)  Stress: No Stress Concern Present (05/21/2021)  Tobacco Use: Low Risk  (04/23/2022)    Readmission Risk Interventions     No data to display

## 2022-05-28 NOTE — TOC Progression Note (Signed)
Transition of Care [facility]) - Progression Note    Patient Details  Name: Madeline Taylor MRN: [id number] Date of Birth: 06-07-1935  Transition of Care [facility]) CM/SW Contact  Darleene Cleaver, [place] Phone Number: 05/28/2022, 5:54 PM  Clinical Narrative:     CSW met with patient's husband and step son in law.  Patient's husband just got out of rehab today, and is on his way home with home health services through [facility] per patient's husband.  CSW explained to patient's husband that patient's information has been sent to several different memory care facilities and SNFs with locked and unlocked units.  Patient's husband was informed that the two options patient has are [address] [facility] or [facility] in [place].  CSW informed patient that the local memory care facilities that are locked do not have any beds available at this time.  Patient's husband feels that patient may do better being back at home.  Per patient's husband she does not wander at home and he will have support from patient's daughter and son plus he is having [facility] HH services.  Patient and his stepson, state they know some church members who have offered to help out.  They asked TOC about private duty caregivers and if insurance would pay for services.  CSW informed him that unfortunately they do not.  However CSW can set up [facility] PT, OT, RN, aide, and social worker to see patient at home and they could assist patient with finding help in the home.  CSW explained that patient does not seem to have enough skill able needs for patient to go to SNF for short term rehab and then transition to LTC.  Also due to patient's wondering she is not getting any bed offers.  TOC to follow up on Monday with family decision.  CSW informed family that if they don't make a decision soon, we have to present a hinn letter to explain what the cost will be.  Patient's husband expressed understanding and will discuss with his other family members.   TOC to continue to follow patient's progress throughout discharges planning.   Expected Discharge Plan: Long Term Nursing Home Barriers to Discharge: No SNF bed  Expected Discharge Plan and Services       Living arrangements for the past 2 months: Single Family Home                 DME Arranged: Walker rolling DME Agency: [employer] Date DME Agency Contacted: 04/25/22 Time DME Agency Contacted: [phone] Representative spoke with at DME Agency: [name]             Social Determinants of Health (SDOH) Interventions SDOH Screenings   Food Insecurity: Patient Unable To Answer (04/23/2022)  Housing: Low Risk  (04/23/2022)  Transportation Needs: No Transportation Needs (04/23/2022)  Utilities: Not At Risk (04/23/2022)  Alcohol Screen: Low Risk  (05/21/2021)  Depression (PHQ2-9): Low Risk  (05/21/2021)  Financial Resource Strain: Low Risk  (05/21/2021)  Physical Activity: Inactive (05/21/2021)  Social Connections: Moderately Integrated (05/21/2021)  Stress: No Stress Concern Present (05/21/2021)  Tobacco Use: Low Risk  (04/23/2022)    Readmission Risk Interventions     No data to display

## 2022-05-29 DIAGNOSIS — R7881 Bacteremia: Secondary | ICD-10-CM | POA: Diagnosis not present

## 2022-05-29 DIAGNOSIS — B9620 Unspecified Escherichia coli [E. coli] as the cause of diseases classified elsewhere: Secondary | ICD-10-CM | POA: Diagnosis not present

## 2022-05-29 NOTE — Progress Notes (Signed)
PROGRESS NOTE    Madeline Taylor  ZOX:[id number] DOB: [birth year] DOA: 04/22/2022 PCP: [name], NP   Brief Narrative:  This 87 yrs old female with medical history significant of dementia, hypertension, history of TIAs presenting with encephalopathy, hypothermia . Per daughter, patient had worsening dementia, frequently Lost walking out of the house.  But normally has good appetite.  For the last week, patient had significant increased agitation, has not been sleeping well  Assessment & Plan:   Principal Problem:   Bacteremia due to Escherichia coli Active Problems:   Pancreatic pseudocyst   Essential hypertension   Vascular dementia (HCC)   History of TIA (transient ischemic attack)   Hypothermia   Osteoarthritis of multiple joints   Metabolic acidosis   Dementia with behavioral disturbance (HCC)   Facial bruising, sequela   Hyponatremia   E. coli bacteremia:  She presented with AMS, and fever of 102 on 05/15/22.   Blood cultures positive for E. coli. Likely urine source with urinalysis positive.  She was started empirically on Rocephin on 4/27.    Urine culture was sent and antibiotics were started on 4/27.   Urine analysis was positive. Still suspect a urinary source.   Patient has completed antibiotics course.   Pancreatic pseudocyst: Seen on CT scan of the abdomen without contrast.  CT scan pancreatic protocol showed Pancreatic Pseudocyst. No evidence of neoplasm. CA 19-9--wnl and CA-125--wnl   Vascular dementia: Patient has Dementia with behavioral disturbance.  Continue Seroquel at night.  Risperdal as needed during the day only if needed.   Patient is much calmer and cooperative and answers questions.  Patient has not been wandering.  Sleeping well with Seroquel at night.   Has not had any behavioral disturbance per staff. She is quiet and pleasant   Essential hypertension: Continue amlodipine and losartan.   History of TIA (transient ischemic attack) MRI  brain was negative for acute intracranial abnormality.   Hypothermia Resolved.   Hyponatremia Resolved.   Facial bruising, sequela Bruising around the left eye upper and lower lids and left frontal hematoma.   Metabolic acidosis Improved / Resolved.   Overall medically stable.  TOC is working with daughter regarding discharge plan. Patient has two places who has offered bed. Awaiting response from daughter. Patient is medically stable for discharge since 05/19/22   DVT prophylaxis: SCDs Code Status: Full code Family Communication:No family at bed side. Disposition Plan:   Status is: Inpatient Remains inpatient appropriate because: Admitted for bacteremia due to E. coli.  TOC is working on memory care placement.  Consultants:   None  Procedures:None  Antimicrobials: Anti-infectives (From admission, onward)    Start     Dose/Rate Route Frequency Ordered Stop   05/20/22 0600  amoxicillin (AMOXIL) capsule 1,000 mg        1,000 mg Oral Every 8 hours 05/19/22 1515 05/21/22 2117   05/18/22 1800  ceFAZolin (ANCEF) IVPB 2g/100 mL premix        2 g 200 mL/hr over 30 Minutes Intravenous Every 8 hours 05/18/22 1338 05/19/22 2213   05/16/22 1000  azithromycin (ZITHROMAX) tablet 250 mg  Status:  Discontinued       See Hyperspace for full Linked Orders Report.   250 mg Oral Daily 05/15/22 1631 05/16/22 0258   05/15/22 1800  cefTRIAXone (ROCEPHIN) 2 g in sodium chloride 0.9 % 100 mL IVPB  Status:  Discontinued        2 g 200 mL/hr over 30 Minutes Intravenous Every  24 hours 05/15/22 1631 05/18/22 1338   05/15/22 1730  azithromycin (ZITHROMAX) tablet 500 mg       See Hyperspace for full Linked Orders Report.   500 mg Oral Daily 05/15/22 1631 05/15/22 1805   04/24/22 1200  vancomycin (VANCOREADY) IVPB 500 mg/100 mL  Status:  Discontinued        500 mg 100 mL/hr over 60 Minutes Intravenous Every 24 hours 04/23/22 1206 04/24/22 1212   04/23/22 1330  vancomycin (VANCOCIN) IVPB 1000 mg/200  mL premix        1,000 mg 200 mL/hr over 60 Minutes Intravenous  Once 04/23/22 1206 04/23/22 1731   04/23/22 1300  ceFEPIme (MAXIPIME) 2 g in sodium chloride 0.9 % 100 mL IVPB  Status:  Discontinued        2 g 200 mL/hr over 30 Minutes Intravenous Every 12 hours 04/23/22 1206 04/24/22 1212      Subjective: Patient was seen and examined at bedside.  Overnight events noted.  Patient appears calm, cooperative, quiet, pleasant, denies any concerns.   Objective: Vitals:   05/28/22 0858 05/28/22 1602 05/29/22 0004 05/29/22 0844  BP: 139/64 (!) 152/74 (!) 162/73 (!) 155/64  Pulse: 80 87 67 75  Resp: 17 17 16 16   Temp: 98.2 F (36.8 C) 97.9 F (36.6 C) 98.9 F (37.2 C) 98.8 F (37.1 C)  TempSrc:    Oral  SpO2: 100% 100% 99% 100%  Weight:      Height:        Intake/Output Summary (Last 24 hours) at 05/29/2022 1054 Last data filed at 05/29/2022 1030 Gross per 24 hour  Intake 480 ml  Output --  Net 480 ml    Filed Weights   04/22/22 1757  Weight: 49.4 kg    Examination:  General exam: Appears calm, comfortable, not in any acute distress. Respiratory system: CTA bilaterally. Respiratory effort normal.  RR 14 Cardiovascular system: S1 & S2 heard, regular rate and rhythm, no murmur. Gastrointestinal system: Abdomen is soft, non tender, non distended, BS+ Central nervous system: Alert and oriented x 2, no focal neurological deficits. Extremities: No edema, no cyanosis, no clubbing. Skin: No rashes, lesions or ulcers Psychiatry: Judgement and insight appear normal. Mood & affect appropriate.     Data Reviewed: I have personally reviewed following labs and imaging studies  CBC: No results for input(s): "WBC", "NEUTROABS", "HGB", "HCT", "MCV", "PLT" in the last 168 hours.  Basic Metabolic Panel: No results for input(s): "NA", "K", "CL", "CO2", "GLUCOSE", "BUN", "CREATININE", "CALCIUM", "MG", "PHOS" in the last 168 hours. GFR: Estimated Creatinine Clearance: 35.7 mL/min  (by C-G formula based on SCr of 0.76 mg/dL). Liver Function Tests: No results for input(s): "AST", "ALT", "ALKPHOS", "BILITOT", "PROT", "ALBUMIN" in the last 168 hours. No results for input(s): "LIPASE", "AMYLASE" in the last 168 hours. No results for input(s): "AMMONIA" in the last 168 hours. Coagulation Profile: No results for input(s): "INR", "PROTIME" in the last 168 hours. Cardiac Enzymes: No results for input(s): "CKTOTAL", "CKMB", "CKMBINDEX", "TROPONINI" in the last 168 hours. BNP (last 3 results) No results for input(s): "PROBNP" in the last 8760 hours. HbA1C: No results for input(s): "HGBA1C" in the last 72 hours. CBG: No results for input(s): "GLUCAP" in the last 168 hours. Lipid Profile: No results for input(s): "CHOL", "HDL", "LDLCALC", "TRIG", "CHOLHDL", "LDLDIRECT" in the last 72 hours. Thyroid Function Tests: No results for input(s): "TSH", "T4TOTAL", "FREET4", "T3FREE", "THYROIDAB" in the last 72 hours. Anemia Panel: No results for input(s): "VITAMINB12", "FOLATE", "  FERRITIN", "TIBC", "IRON", "RETICCTPCT" in the last 72 hours. Sepsis Labs: No results for input(s): "PROCALCITON", "LATICACIDVEN" in the last 168 hours.  No results found for this or any previous visit (from the past 240 hour(s)).   Radiology Studies: No results found.  Scheduled Meds:  amLODipine  5 mg Oral Daily   losartan  50 mg Oral Daily   QUEtiapine  25 mg Oral QHS   senna-docusate  2 tablet Oral BID   Continuous Infusions:   LOS: 36 days    Time spent: 35 mins    Willeen Niece, MD [facility]   If 7PM-7AM, please contact night-coverage

## 2022-05-29 NOTE — Progress Notes (Signed)
Mobility Specialist - Progress Note   05/29/22 0904  Mobility  Activity Ambulated independently in hallway  Level of Assistance Independent  Assistive Device None  Distance Ambulated (ft) 640 ft  Activity Response Tolerated well  Mobility Referral Yes  $Mobility charge 1 Mobility  Mobility Specialist Start Time (ACUTE ONLY) [id number]  Mobility Specialist Stop Time (ACUTE ONLY) 0902  Mobility Specialist Time Calculation (min) (ACUTE ONLY) 11 min   Pt sitting in recliner on RA upon arrival. Pt STS and ambulates 4 laps around NS indep. Pt returns to bed with needs in reach and sitter present.   Terrilyn Saver  Mobility Specialist  05/29/22 9:06 AM

## 2022-05-30 DIAGNOSIS — R7881 Bacteremia: Secondary | ICD-10-CM | POA: Diagnosis not present

## 2022-05-30 DIAGNOSIS — B9620 Unspecified Escherichia coli [E. coli] as the cause of diseases classified elsewhere: Secondary | ICD-10-CM | POA: Diagnosis not present

## 2022-05-30 NOTE — Progress Notes (Signed)
PROGRESS NOTE    Madeline Taylor  ZOX:[id number] DOB: 12-23-1935 DOA: 04/22/2022 PCP: [name], NP   Brief Narrative:  This 87 yrs old female with medical history significant of dementia, hypertension, history of TIAs presenting with encephalopathy, hypothermia . Per daughter, patient had worsening dementia, frequently Lost walking out of the house.  But normally has good appetite.  For the last week, patient had significant increased agitation, has not been sleeping well  Assessment & Plan:   Principal Problem:   Bacteremia due to Escherichia coli Active Problems:   Pancreatic pseudocyst   Essential hypertension   Vascular dementia (HCC)   History of TIA (transient ischemic attack)   Hypothermia   Osteoarthritis of multiple joints   Metabolic acidosis   Dementia with behavioral disturbance (HCC)   Facial bruising, sequela   Hyponatremia   E. coli bacteremia:  She presented with AMS, and fever of 102 on 05/15/22.   Blood cultures positive for E. coli. Likely urine source with urinalysis positive.  She was started empirically on Rocephin on 4/27.    Urine culture was sent and antibiotics were started on 4/27.   Urine analysis was positive. Still suspect a urinary source.   Patient has completed antibiotics course.   Pancreatic pseudocyst: Seen on CT scan of the abdomen without contrast.  CT scan pancreatic protocol showed Pancreatic Pseudocyst. No evidence of neoplasm. CA 19-9--wnl and CA-125--wnl   Vascular dementia: Patient has Dementia with behavioral disturbance.  Continue Seroquel at night.  Risperdal as needed during the day only if needed.   Patient is much calmer and cooperative and answers questions.  Patient has not been wandering.  Sleeping well with Seroquel at night.   Has not had any behavioral disturbance per staff. She is quiet and pleasant   Essential hypertension: Continue amlodipine and losartan.   History of TIA (transient ischemic attack) MRI  brain was negative for acute intracranial abnormality.   Hypothermia Resolved.   Hyponatremia Resolved.   Facial bruising, sequela Bruising around the left eye upper and lower lids and left frontal hematoma.   Metabolic acidosis Improved / Resolved.   Overall medically stable.  TOC is working with daughter regarding discharge plan. Patient has two places who has offered bed. Awaiting response from daughter. Patient is medically stable for discharge since 05/19/22   DVT prophylaxis: SCDs Code Status: Full code Family Communication:No family at bed side. Disposition Plan:   Status is: Inpatient Remains inpatient appropriate because: Admitted for bacteremia due to E. coli.  TOC is working on memory care placement.  Consultants:   None  Procedures:None  Antimicrobials: Anti-infectives (From admission, onward)    Start     Dose/Rate Route Frequency Ordered Stop   05/20/22 0600  amoxicillin (AMOXIL) capsule 1,000 mg        1,000 mg Oral Every 8 hours 05/19/22 1515 05/21/22 2117   05/18/22 1800  ceFAZolin (ANCEF) IVPB 2g/100 mL premix        2 g 200 mL/hr over 30 Minutes Intravenous Every 8 hours 05/18/22 1338 05/19/22 2213   05/16/22 1000  azithromycin (ZITHROMAX) tablet 250 mg  Status:  Discontinued       See Hyperspace for full Linked Orders Report.   250 mg Oral Daily 05/15/22 1631 05/16/22 0258   05/15/22 1800  cefTRIAXone (ROCEPHIN) 2 g in sodium chloride 0.9 % 100 mL IVPB  Status:  Discontinued        2 g 200 mL/hr over 30 Minutes Intravenous Every  24 hours 05/15/22 1631 05/18/22 1338   05/15/22 1730  azithromycin (ZITHROMAX) tablet 500 mg       See Hyperspace for full Linked Orders Report.   500 mg Oral Daily 05/15/22 1631 05/15/22 1805   04/24/22 1200  vancomycin (VANCOREADY) IVPB 500 mg/100 mL  Status:  Discontinued        500 mg 100 mL/hr over 60 Minutes Intravenous Every 24 hours 04/23/22 1206 04/24/22 1212   04/23/22 1330  vancomycin (VANCOCIN) IVPB 1000 mg/200  mL premix        1,000 mg 200 mL/hr over 60 Minutes Intravenous  Once 04/23/22 1206 04/23/22 1731   04/23/22 1300  ceFEPIme (MAXIPIME) 2 g in sodium chloride 0.9 % 100 mL IVPB  Status:  Discontinued        2 g 200 mL/hr over 30 Minutes Intravenous Every 12 hours 04/23/22 1206 04/24/22 1212      Subjective: Patient was seen and examined at bedside.  Overnight events noted.  Patient appears cooperative, calm, comfortable, pleasant, denies any concerns.   Objective: Vitals:   05/29/22 1622 05/30/22 0457 05/30/22 0730 05/30/22 0733  BP: (!) 144/78 (!) 155/76 (!) 158/82 (!) 164/76  Pulse: 82 79    Resp: 18 18 16 16   Temp: 98.6 F (37 C) 97.9 F (36.6 C) 98 F (36.7 C) 98 F (36.7 C)  TempSrc: Oral Oral    SpO2: 99% 100% 100% 100%  Weight:      Height:        Intake/Output Summary (Last 24 hours) at 05/30/2022 1103 Last data filed at 05/30/2022 0954 Gross per 24 hour  Intake 480 ml  Output --  Net 480 ml    Filed Weights   04/22/22 1757  Weight: 49.4 kg    Examination:  General exam: Appears comfortable, calm, not in any acute distress. Respiratory system: CTA bilaterally. Respiratory effort normal.  RR 13 Cardiovascular system: S1 & S2 heard, regular rate and rhythm, no murmur. Gastrointestinal system: Abdomen is soft, non tender, non distended, BS+ Central nervous system: Alert and oriented x 2, no focal neurological deficits. Extremities: No edema, no cyanosis, no clubbing. Skin: No rashes, lesions or ulcers Psychiatry: Mood & affect appropriate.     Data Reviewed: I have personally reviewed following labs and imaging studies  CBC: No results for input(s): "WBC", "NEUTROABS", "HGB", "HCT", "MCV", "PLT" in the last 168 hours.  Basic Metabolic Panel: No results for input(s): "NA", "K", "CL", "CO2", "GLUCOSE", "BUN", "CREATININE", "CALCIUM", "MG", "PHOS" in the last 168 hours. GFR: Estimated Creatinine Clearance: 35.7 mL/min (by C-G formula based on SCr of  0.76 mg/dL). Liver Function Tests: No results for input(s): "AST", "ALT", "ALKPHOS", "BILITOT", "PROT", "ALBUMIN" in the last 168 hours. No results for input(s): "LIPASE", "AMYLASE" in the last 168 hours. No results for input(s): "AMMONIA" in the last 168 hours. Coagulation Profile: No results for input(s): "INR", "PROTIME" in the last 168 hours. Cardiac Enzymes: No results for input(s): "CKTOTAL", "CKMB", "CKMBINDEX", "TROPONINI" in the last 168 hours. BNP (last 3 results) No results for input(s): "PROBNP" in the last 8760 hours. HbA1C: No results for input(s): "HGBA1C" in the last 72 hours. CBG: No results for input(s): "GLUCAP" in the last 168 hours. Lipid Profile: No results for input(s): "CHOL", "HDL", "LDLCALC", "TRIG", "CHOLHDL", "LDLDIRECT" in the last 72 hours. Thyroid Function Tests: No results for input(s): "TSH", "T4TOTAL", "FREET4", "T3FREE", "THYROIDAB" in the last 72 hours. Anemia Panel: No results for input(s): "VITAMINB12", "FOLATE", "FERRITIN", "TIBC", "IRON", "RETICCTPCT"  in the last 72 hours. Sepsis Labs: No results for input(s): "PROCALCITON", "LATICACIDVEN" in the last 168 hours.  No results found for this or any previous visit (from the past 240 hour(s)).   Radiology Studies: No results found.  Scheduled Meds:  amLODipine  5 mg Oral Daily   losartan  50 mg Oral Daily   QUEtiapine  25 mg Oral QHS   senna-docusate  2 tablet Oral BID   Continuous Infusions:   LOS: 37 days    Time spent: 35 mins    Willeen Niece, MD [facility]   If 7PM-7AM, please contact night-coverage

## 2022-05-30 NOTE — Progress Notes (Signed)
Mobility Specialist - Progress Note   05/30/22 0830  Mobility  Activity Ambulated independently in hallway;Stood at bedside;Dangled on edge of bed  Level of Assistance Independent  Assistive Device None  Distance Ambulated (ft) 800 ft  Activity Response Tolerated well  Mobility Referral Yes  $Mobility charge 1 Mobility  Mobility Specialist Start Time (ACUTE ONLY) [phone]  Mobility Specialist Stop Time (ACUTE ONLY) 0829  Mobility Specialist Time Calculation (min) (ACUTE ONLY) 18 min   Pt semi-supine in bed on RA upon arrival. Pt STS and ambulates in hallway indep. Pt returns to bed with needs in reach and sitter present.   Terrilyn Saver  Mobility Specialist  05/30/22 8:31 AM

## 2022-05-31 DIAGNOSIS — R7881 Bacteremia: Secondary | ICD-10-CM | POA: Diagnosis not present

## 2022-05-31 DIAGNOSIS — B9620 Unspecified Escherichia coli [E. coli] as the cause of diseases classified elsewhere: Secondary | ICD-10-CM | POA: Diagnosis not present

## 2022-05-31 NOTE — Progress Notes (Signed)
Mobility Specialist - Progress Note   05/31/22 1114  Mobility  Activity Ambulated independently in hallway  Level of Assistance Independent  Assistive Device None  Distance Ambulated (ft) 480 ft  Activity Response Tolerated well  $Mobility charge 1 Mobility  Mobility Specialist Start Time (ACUTE ONLY) 1107  Mobility Specialist Stop Time (ACUTE ONLY) 1113  Mobility Specialist Time Calculation (min) (ACUTE ONLY) 6 min   Pt amb at NS upon arrival, utilizing RA. Pt expresses soreness in hips, however agreeable to amb in the hall. Pt amb three laps around the NS, slight shuffle gait noticed during amb-- sliding of BLE while amb the last two laps. Pt returned to room, left amb with needs within reach and sitter present at bedside.   Zetta Bills Mobility Specialist 05/31/22 11:21 AM

## 2022-05-31 NOTE — TOC Progression Note (Addendum)
Transition of Care [facility] - Progression Note    Patient Details  Name: Madeline Taylor MRN: [id number] Date of Birth: August 12, 1935  Transition of Care [facility]) CM/SW Contact  Darleene Cleaver, [place] Phone Number: 05/31/2022, 12:53 PM  Clinical Narrative:     CSW spoke to patient's daughter [name], [phone] to give an updated on patient's progress with placement.  Per Patient's daughter, Medicaid has not been approved and patient has a down spend of $36,000.  Patient's daughter stated that [facility] DSS needs a new FL2 with the dementia diagnosis on it, and domiciliary home.  Per patient's daughter, the husband has some confusion, and APS is currently following patient the worker is Casimer Leek.    Patient's daughter stated there is a guardianship hearing next week on Monday.  Per patient's daughter she has contacted a 3 facilities for memory care, two of them said no.  One more is considering patient, CSW informed her that per our conversation last week, [facility] care said no, [facility], [facility] will not accept patient due to her wandering, [facility] does not have any memory care beds available.  [facility] will not take Medicaid, [facility] care unit will also not take Medicaid.  [facility] has not been returning calls.  [facility] in [place] can not accept patient since she does not have any skillable needs, and [facility] does not have any beds available anymore.  Continuing to look for placement options for patient.    CSW spoke to physician advisor Dr. Catha Gosselin, to ask if patient needs a HINN, she advised that there is not a safe discharge plan in place yet, so it would not be appropriate at this time.  CSW asked patient's daughter if she has spoken to the [place] regarding Aid & attendance program, she said yes she has, and she submitted an application to get patient set up for services, however per [place] since husband is getting HH  through the [place] at this time, they can not submit until he is done with [facility] services.  CSW offered to patient's daughter if she would like [employer] or [facility] to be involved in assisting with placement under private pay, and she said not at this time.  Expected Discharge Plan: Long Term Nursing Home Barriers to Discharge: No SNF bed  Expected Discharge Plan and Services       Living arrangements for the past 2 months: Single Family Home                 DME Arranged: Walker rolling DME Agency: [employer] Date DME Agency Contacted: 04/25/22 Time DME Agency Contacted: [phone] Representative spoke with at DME Agency: [name]             Social Determinants of Health (SDOH) Interventions SDOH Screenings   Food Insecurity: Patient Unable To Answer (04/23/2022)  Housing: Low Risk  (04/23/2022)  Transportation Needs: No Transportation Needs (04/23/2022)  Utilities: Not At Risk (04/23/2022)  Alcohol Screen: Low Risk  (05/21/2021)  Depression (PHQ2-9): Low Risk  (05/21/2021)  Financial Resource Strain: Low Risk  (05/21/2021)  Physical Activity: Inactive (05/21/2021)  Social Connections: Moderately Integrated (05/21/2021)  Stress: No Stress Concern Present (05/21/2021)  Tobacco Use: Low Risk  (04/23/2022)    Readmission Risk Interventions     No data to display

## 2022-05-31 NOTE — Progress Notes (Signed)
PROGRESS NOTE    Madeline Taylor  NWG:[id number] DOB: 06-Jul-1935 DOA: 04/22/2022 PCP: [name], NP   Brief Narrative:  This 87 yrs old female with medical history significant of dementia, hypertension, history of TIAs presenting with encephalopathy, hypothermia . Per daughter, patient had worsening dementia, frequently Lost walking out of the house.  But normally has good appetite.  For the last week, patient had significant increased agitation, has not been sleeping well  Assessment & Plan:   Principal Problem:   Bacteremia due to Escherichia coli Active Problems:   Pancreatic pseudocyst   Essential hypertension   Vascular dementia (HCC)   History of TIA (transient ischemic attack)   Hypothermia   Osteoarthritis of multiple joints   Metabolic acidosis   Dementia with behavioral disturbance (HCC)   Facial bruising, sequela   Hyponatremia   E. coli bacteremia:  She presented with AMS, and fever of 102 on 05/15/22.   Blood cultures positive for E. coli. Likely urine source with urinalysis positive.  She was started empirically on Rocephin on 4/27.    Patient has completed antibiotics course.   Pancreatic pseudocyst: Seen on CT scan of the abdomen without contrast.  CT scan pancreatic protocol showed Pancreatic Pseudocyst. No evidence of neoplasm. CA 19-9--wnl and CA-125--wnl   Vascular dementia: Patient has Dementia with behavioral disturbance.  Continue Seroquel at night.  Risperdal as needed during the day only if needed.   Patient is much calmer and cooperative and answers questions.  Patient has not been wandering.  Sleeping well with Seroquel at night.   Has not had any behavioral disturbance per staff. She is quiet and pleasant   Essential hypertension: Continue amlodipine and losartan.   History of TIA (transient ischemic attack) MRI brain was negative for acute intracranial abnormality.   Hypothermia Resolved.   Hyponatremia Resolved.   Facial  bruising, sequela Bruising around the left eye upper and lower lids and left frontal hematoma.   Metabolic acidosis Improved / Resolved.   Overall medically stable.  TOC is working with daughter regarding discharge plan. Patient has two places who has offered bed.  Awaiting response from daughter. Patient is medically stable for discharge since 05/19/22   DVT prophylaxis: SCDs Code Status: Full code Family Communication:No family at bed side. Disposition Plan:   Status is: Inpatient Remains inpatient appropriate because: Admitted for bacteremia due to E. coli.  TOC is working on memory care placement.  Consultants:   None  Procedures:None  Antimicrobials: Anti-infectives (From admission, onward)    Start     Dose/Rate Route Frequency Ordered Stop   05/20/22 0600  amoxicillin (AMOXIL) capsule 1,000 mg        1,000 mg Oral Every 8 hours 05/19/22 1515 05/21/22 2117   05/18/22 1800  ceFAZolin (ANCEF) IVPB 2g/100 mL premix        2 g 200 mL/hr over 30 Minutes Intravenous Every 8 hours 05/18/22 1338 05/19/22 2213   05/16/22 1000  azithromycin (ZITHROMAX) tablet 250 mg  Status:  Discontinued       See Hyperspace for full Linked Orders Report.   250 mg Oral Daily 05/15/22 1631 05/16/22 0258   05/15/22 1800  cefTRIAXone (ROCEPHIN) 2 g in sodium chloride 0.9 % 100 mL IVPB  Status:  Discontinued        2 g 200 mL/hr over 30 Minutes Intravenous Every 24 hours 05/15/22 1631 05/18/22 1338   05/15/22 1730  azithromycin (ZITHROMAX) tablet 500 mg  See Hyperspace for full Linked Orders Report.   500 mg Oral Daily 05/15/22 1631 05/15/22 1805   04/24/22 1200  vancomycin (VANCOREADY) IVPB 500 mg/100 mL  Status:  Discontinued        500 mg 100 mL/hr over 60 Minutes Intravenous Every 24 hours 04/23/22 1206 04/24/22 1212   04/23/22 1330  vancomycin (VANCOCIN) IVPB 1000 mg/200 mL premix        1,000 mg 200 mL/hr over 60 Minutes Intravenous  Once 04/23/22 1206 04/23/22 1731   04/23/22 1300   ceFEPIme (MAXIPIME) 2 g in sodium chloride 0.9 % 100 mL IVPB  Status:  Discontinued        2 g 200 mL/hr over 30 Minutes Intravenous Every 12 hours 04/23/22 1206 04/24/22 1212      Subjective: Patient was seen and examined at bedside.  Overnight events noted.  Patient appears cooperative, comfortable, calm, pleasant, denies any concerns.  Objective: Vitals:   05/30/22 1250 05/30/22 1521 05/30/22 2324 05/31/22 0802  BP: (!) 166/78 (!) 152/74 (!) 147/79 138/75  Pulse: 90 70 81 72  Resp: 20 16 16 18   Temp:  98.2 F (36.8 C) (!) 97.4 F (36.3 C) 98.1 F (36.7 C)  TempSrc:   Oral   SpO2: 100% 100% 98% 100%  Weight:      Height:        Intake/Output Summary (Last 24 hours) at 05/31/2022 1210 Last data filed at 05/30/2022 1900 Gross per 24 hour  Intake 240 ml  Output --  Net 240 ml    Filed Weights   04/22/22 1757  Weight: 49.4 kg    Examination:  General exam: Appears comfortable, calm, not in any acute distress. Respiratory system: CTA bilaterally. Respiratory effort normal.  RR 14 Cardiovascular system: S1 & S2 heard, regular rate and rhythm, no murmur. Gastrointestinal system: Abdomen is soft, non tender, non distended, BS+ Central nervous system: Alert and oriented x 2, no focal neurological deficits. Extremities: No edema, no cyanosis, no clubbing. Skin: No rashes, lesions or ulcers Psychiatry: Mood & affect appropriate.     Data Reviewed: I have personally reviewed following labs and imaging studies  CBC: No results for input(s): "WBC", "NEUTROABS", "HGB", "HCT", "MCV", "PLT" in the last 168 hours.  Basic Metabolic Panel: No results for input(s): "NA", "K", "CL", "CO2", "GLUCOSE", "BUN", "CREATININE", "CALCIUM", "MG", "PHOS" in the last 168 hours. GFR: Estimated Creatinine Clearance: 35.7 mL/min (by C-G formula based on SCr of 0.76 mg/dL). Liver Function Tests: No results for input(s): "AST", "ALT", "ALKPHOS", "BILITOT", "PROT", "ALBUMIN" in the last 168  hours. No results for input(s): "LIPASE", "AMYLASE" in the last 168 hours. No results for input(s): "AMMONIA" in the last 168 hours. Coagulation Profile: No results for input(s): "INR", "PROTIME" in the last 168 hours. Cardiac Enzymes: No results for input(s): "CKTOTAL", "CKMB", "CKMBINDEX", "TROPONINI" in the last 168 hours. BNP (last 3 results) No results for input(s): "PROBNP" in the last 8760 hours. HbA1C: No results for input(s): "HGBA1C" in the last 72 hours. CBG: No results for input(s): "GLUCAP" in the last 168 hours. Lipid Profile: No results for input(s): "CHOL", "HDL", "LDLCALC", "TRIG", "CHOLHDL", "LDLDIRECT" in the last 72 hours. Thyroid Function Tests: No results for input(s): "TSH", "T4TOTAL", "FREET4", "T3FREE", "THYROIDAB" in the last 72 hours. Anemia Panel: No results for input(s): "VITAMINB12", "FOLATE", "FERRITIN", "TIBC", "IRON", "RETICCTPCT" in the last 72 hours. Sepsis Labs: No results for input(s): "PROCALCITON", "LATICACIDVEN" in the last 168 hours.  No results found for this or any  previous visit (from the past 240 hour(s)).   Radiology Studies: No results found.  Scheduled Meds:  amLODipine  5 mg Oral Daily   losartan  50 mg Oral Daily   QUEtiapine  25 mg Oral QHS   senna-docusate  2 tablet Oral BID   Continuous Infusions:   LOS: 38 days    Time spent: 35 mins    Willeen Niece, MD [facility]   If 7PM-7AM, please contact night-coverage

## 2022-05-31 NOTE — Plan of Care (Signed)

## 2022-06-01 DIAGNOSIS — B9620 Unspecified Escherichia coli [E. coli] as the cause of diseases classified elsewhere: Secondary | ICD-10-CM | POA: Diagnosis not present

## 2022-06-01 DIAGNOSIS — R7881 Bacteremia: Secondary | ICD-10-CM | POA: Diagnosis not present

## 2022-06-01 NOTE — Plan of Care (Signed)

## 2022-06-01 NOTE — NC FL2 (Cosign Needed Addendum)
[place] MEDICAID FL2 LEVEL OF CARE FORM     IDENTIFICATION  Patient Name: Madeline Taylor Birthdate: 1935/08/25 Sex: female Admission Date (Current Location): 04/22/2022  [facility] and [place] Number:  [address] [phone] Q Pending Facility and Address:  [facility], [address], [place], [place] [address]      Provider Number: [id number]  Attending Physician Name and Address:  [name], MD  Relative Name and Phone Number:  Annye English Daughter   [phone]  [facility] Spouse   [phone]  Noralee Stain Daughter   [phone]    Current Level of Care: Hospital Recommended Level of Care: Memory Care, Assisted Living Facility, Other (Comment) Prior Approval Number:    Date Approved/Denied:   PASRR Number: [id number]  Discharge Plan: Domiciliary (Rest home) (Memory Care)    Current Diagnoses: Patient Active Problem List   Diagnosis Date Noted   Dementia with behavioral disturbance (HCC)   Pancreatic pseudocyst 04/24/2022 05/18/2022   Bacteremia due to Escherichia coli 05/16/2022   Hyponatremia 05/16/2022   Facial bruising, sequela 05/12/2022   Metabolic acidosis 04/24/2022   Hypothermia 04/23/2022   Mixed incontinence 02/12/2021   Aortic atherosclerosis (HCC) 11/17/2020   Vascular dementia (HCC) 11/17/2020   History of TIA (transient ischemic attack) 11/17/2020   Osteoarthritis of multiple joints 05/18/2018   Hyperlipidemia 07/29/2016   Essential hypertension 07/29/2016   Osteoporosis of femur without pathological fracture 07/29/2016    Orientation RESPIRATION BLADDER Height & Weight     Self  Normal Continent Weight: 108 lb 14.5 oz (49.4 kg) Height:  4\' 11"  (149.9 cm)  BEHAVIORAL SYMPTOMS/MOOD NEUROLOGICAL BOWEL NUTRITION STATUS  Wanderer   Continent Diet  AMBULATORY STATUS COMMUNICATION OF NEEDS Skin   Supervision Verbally Normal                       Personal Care Assistance Level of Assistance  Bathing,  Feeding, Dressing Bathing Assistance: Limited assistance Feeding assistance: Limited assistance Dressing Assistance: Limited assistance     Functional Limitations Info  Sight, Hearing, Speech Sight Info: Adequate Hearing Info: Adequate Speech Info: Adequate    SPECIAL CARE FACTORS FREQUENCY                       Contractures Contractures Info: Not present    Additional Factors Info  Code Status, Allergies, Psychotropic Code Status Info: Full Code Allergies Info: Lisinopril  Statins Psychotropic Info: QUEtiapine (SEROQUEL) tablet 25 mg         Current Medications (06/01/2022):  This is the current hospital active medication list Current Facility-Administered Medications  Medication Dose Route Frequency Provider Last Rate Last Admin   acetaminophen (TYLENOL) tablet 650 mg  650 mg Oral Q6H PRN Alford Highland, MD   650 mg at 05/30/22 0935   alum & mag hydroxide-simeth (MAALOX/MYLANTA) 200-200-20 MG/5ML suspension 30 mL  30 mL Oral Q6H PRN Sharman Cheek, MD       amLODipine (NORVASC) tablet 5 mg  5 mg Oral Daily Marrion Coy, MD   5 mg at 06/01/22 1027   ibuprofen (ADVIL) tablet 400 mg  400 mg Oral Q8H PRN [name], MD   400 mg at 06/01/22 1616   losartan (COZAAR) tablet 50 mg  50 mg Oral Daily Marrion Coy, MD   50 mg at 06/01/22 1027   ondansetron (ZOFRAN) tablet 4 mg  4 mg Oral Q6H PRN Floydene Flock, MD       Or   ondansetron [facility]) injection 4 mg  4 mg Intravenous Q6H PRN Floydene Flock, MD       Oral care mouth rinse  15 mL Mouth Rinse PRN Alford Highland, MD       QUEtiapine (SEROQUEL) tablet 25 mg  25 mg Oral QHS Marrion Coy, MD   25 mg at 05/31/22 2126   risperiDONE (RISPERDAL) tablet 1 mg  1 mg Oral Q12H PRN Alford Highland, MD   1 mg at 05/29/22 1615   senna-docusate (Senokot-S) tablet 2 tablet  2 tablet Oral BID Marrion Coy, MD   2 tablet at 06/01/22 1027     Discharge Medications: Please see discharge summary for a list of discharge  medications.  Relevant Imaging Results:  Relevant Lab Results:   Additional Information SSN [id number]  Darleene Cleaver, LCSW

## 2022-06-01 NOTE — Progress Notes (Addendum)
[facility]  - [place] at [facility]   PATIENT NAME: Madeline Taylor    MR#:  [id number]  DATE OF BIRTH:  1935-10-26  SUBJECTIVE:  patient overall is calm pleasant. Ambulates without difficulty. Tolerated PO diet. No agitation    VITALS:  Blood pressure (!) 148/63, pulse 73, temperature 97.8 F (36.6 C), resp. rate 17, height 4\' 11"  (1.499 m), weight 49.4 kg, SpO2 100 %.  PHYSICAL EXAMINATION:   GENERAL:  86 y.o.-year-old patient with no acute distress.  LUNGS: Normal breath sounds bilaterally, no wheezing CARDIOVASCULAR: S1, S2 normal. No murmur   ABDOMEN: Soft, nontender, nondistended. Bowel sounds present.  EXTREMITIES: No  edema b/l.    NEUROLOGIC: nonfocal  patient is alert and awake. Dementia baseline SKIN: No obvious rash, lesion, or ulcer.    Assessment and Plan 87 yrs old female with medical history significant of dementia, hypertension, history of TIAs presenting with encephalopathy, hypothermia . Per daughter, patient had worsening dementia, frequently Lost walking out of the house.  But normally has good appetite   Vascular dementia (HCC) --Dementia with behavioral disturbance.  -- On Seroquel at night.  Risperdal as needed during the day only if needed.   --Patient calm and cooperative and answers questions.   Bacteremia due to Escherichia coli --resoloved   Pancreatic pseudocyst Seen on CT scan of the abdomen without contrast.  -CT scan pancreatic protocol shows Pancreatic Pseudocyst. NO evidence of neoplasm --CA 19-9--wnl and CA-125--wnl    Essential hypertension --On amlodipine and losartan   History of TIA (transient ischemic attack) -MRI brain was negative for acute intracranial abnormality.   Hyponatremia --stable   Facial bruising, sequela --Bruising around the left eye upper and lower lids and left frontal hematoma--improved   Metabolic acidosis Improved   CODE STATUS:  full DVT Prophylaxis : ambulatory Level of care:  MedSurg Status is: Inpatient Remains inpatient appropriate because: will discharge disposition. Patient is medically stable for several days    TOTAL TIME TAKING CARE OF THIS PATIENT: 25 minutes.  >50% time spent on counselling and coordination of care  Note: This dictation was prepared with Dragon dictation along with smaller phrase technology. Any transcriptional errors that result from this process are unintentional.  [name] M.D    [facility]   CC: Primary care physician; [name], NP

## 2022-06-02 DIAGNOSIS — B9620 Unspecified Escherichia coli [E. coli] as the cause of diseases classified elsewhere: Secondary | ICD-10-CM | POA: Diagnosis not present

## 2022-06-02 DIAGNOSIS — R7881 Bacteremia: Secondary | ICD-10-CM | POA: Diagnosis not present

## 2022-06-02 NOTE — Plan of Care (Signed)

## 2022-06-02 NOTE — Progress Notes (Signed)
Mobility Specialist - Progress Note   06/02/22 1500  Mobility  Activity Ambulated independently in hallway  Level of Assistance Independent  Assistive Device None  Distance Ambulated (ft) 800 ft  Activity Response Tolerated well  Mobility Referral Yes  $Mobility charge 1 Mobility  Mobility Specialist Start Time (ACUTE ONLY) 0141  Mobility Specialist Stop Time (ACUTE ONLY) 0153  Mobility Specialist Time Calculation (min) (ACUTE ONLY) 12 min   Pt OOB in hallway on RA upon arrival. Pt ambulates in hallway indep. Pt returns to recliner with needs in reach and sitter present.  Terrilyn Saver  Mobility Specialist  06/02/22 3:02 PM

## 2022-06-02 NOTE — Progress Notes (Signed)
[facility]  - [place] at [facility]   PATIENT NAME: Madeline Taylor    MR#:  [id number]  DATE OF BIRTH:  14-Apr-1935  SUBJECTIVE:  patient overall is calm pleasant. Ambulates without difficulty. Tolerated PO diet. No agitation    VITALS:  Blood pressure 131/70, pulse 62, temperature (!) 97.5 F (36.4 C), resp. rate 18, height 4\' 11"  (1.499 m), weight 49.4 kg, SpO2 97 %.  PHYSICAL EXAMINATION:   GENERAL:  87 y.o.-year-old patient with no acute distress.  LUNGS: Normal breath sounds bilaterally, no wheezing CARDIOVASCULAR: S1, S2 normal. No murmur     NEUROLOGIC: nonfocal  patient is alert and awake. Dementia baseline  Assessment and Plan 87 yrs old female with medical history significant of dementia, hypertension, history of TIAs presenting with encephalopathy, hypothermia . Per daughter, patient had worsening dementia, frequently Lost walking out of the house.  But normally has good appetite   Vascular dementia (HCC) --Dementia with behavioral disturbance.  -- On Seroquel at night.  Risperdal as needed during the day only if needed.   --Patient calm and cooperative and answers questions.   Bacteremia due to Escherichia coli --resoloved   Pancreatic pseudocyst Seen on CT scan of the abdomen without contrast.  -CT scan pancreatic protocol shows Pancreatic Pseudocyst. NO evidence of neoplasm --CA 19-9--wnl and CA-125--wnl    Essential hypertension --On amlodipine and losartan   History of TIA (transient ischemic attack) -MRI brain was negative for acute intracranial abnormality.   Hyponatremia --stable   Facial bruising, sequela --Bruising around the left eye upper and lower lids and left frontal hematoma--improved   Metabolic acidosis Improved   CODE STATUS:  full DVT Prophylaxis : ambulatory Level of care: MedSurg Status is: Inpatient Remains inpatient appropriate because: Difficult discharge disposition. Patient is medically stable for  discharge    TOTAL TIME TAKING CARE OF THIS PATIENT: 25 minutes.  >50% time spent on counselling and coordination of care  Note: This dictation was prepared with Dragon dictation along with smaller phrase technology. Any transcriptional errors that result from this process are unintentional.  [name] M.D    [facility]   CC: Primary care physician; [name], NP

## 2022-06-02 NOTE — Plan of Care (Signed)
Patient will remain free of injury. Problem: Education: Goal: Knowledge of General Education information will improve Description: Including pain rating scale, medication(s)/side effects and non-pharmacologic comfort measures Outcome: Progressing   Problem: Health Behavior/Discharge Planning: Goal: Ability to manage health-related needs will improve Outcome: Progressing   Problem: Clinical Measurements: Goal: Ability to maintain clinical measurements within normal limits will improve Outcome: Progressing Goal: Will remain free from infection Outcome: Progressing Goal: Diagnostic test results will improve Outcome: Progressing Goal: Respiratory complications will improve Outcome: Progressing Goal: Cardiovascular complication will be avoided Outcome: Progressing   Problem: Activity: Goal: Risk for activity intolerance will decrease Outcome: Progressing   Problem: Nutrition: Goal: Adequate nutrition will be maintained Outcome: Progressing   Problem: Coping: Goal: Level of anxiety will decrease Outcome: Progressing   Problem: Elimination: Goal: Will not experience complications related to bowel motility Outcome: Progressing Goal: Will not experience complications related to urinary retention Outcome: Progressing   Problem: Pain Managment: Goal: General experience of comfort will improve Outcome: Progressing   Problem: Safety: Goal: Ability to remain free from injury will improve Outcome: Progressing   Problem: Skin Integrity: Goal: Risk for impaired skin integrity will decrease Outcome: Progressing

## 2022-06-03 DIAGNOSIS — B9620 Unspecified Escherichia coli [E. coli] as the cause of diseases classified elsewhere: Secondary | ICD-10-CM | POA: Diagnosis not present

## 2022-06-03 DIAGNOSIS — R7881 Bacteremia: Secondary | ICD-10-CM | POA: Diagnosis not present

## 2022-06-03 NOTE — Progress Notes (Signed)
[facility]  - [place] at [facility]   PATIENT NAME: Madeline Taylor    MR#:  [id number]  DATE OF BIRTH:  April 23, 1935  SUBJECTIVE:  patient overall is calm pleasant. Ambulates without difficulty. Tolerated PO diet. No agitation VITALS:  Blood pressure 128/71, pulse 74, temperature 97.8 F (36.6 C), temperature source Oral, resp. rate 17, height 4\' 11"  (1.499 m), weight 49.4 kg, SpO2 100 %.  PHYSICAL EXAMINATION:   GENERAL:  87 y.o.-year-old patient with no acute distress.  LUNGS: Normal breath sounds bilaterally, no wheezing CARDIOVASCULAR: S1, S2 normal. No murmur     NEUROLOGIC: nonfocal  patient is alert and awake. Dementia baseline  Assessment and Plan 87 yrs old female with medical history significant of dementia, hypertension, history of TIAs presenting with encephalopathy, hypothermia . Per daughter, patient had worsening dementia, frequently Lost walking out of the house.  But normally has good appetite   Vascular dementia (HCC) --Dementia with behavioral disturbance.  -- On Seroquel at night.   --Patient calm and cooperative and answers questions.   Bacteremia due to Escherichia coli --resoloved   Pancreatic pseudocyst -CT scan pancreatic protocol shows Pancreatic Pseudocyst. NO evidence of neoplasm --CA 19-9--wnl and CA-125--wnl    Essential hypertension --On amlodipine and losartan   History of TIA (transient ischemic attack) -MRI brain was negative for acute intracranial abnormality.    Facial bruising, sequela --improved     CODE STATUS:  full DVT Prophylaxis : ambulatory Level of care: MedSurg Status is: Inpatient Remains inpatient appropriate because: Difficult discharge disposition. Patient is medically stable for discharge    TOTAL TIME TAKING CARE OF THIS PATIENT: 25 minutes.  >50% time spent on counselling and coordination of care  Note: This dictation was prepared with Dragon dictation along with smaller phrase technology. Any  transcriptional errors that result from this process are unintentional.  [name] M.D    [facility]   CC: Primary care physician; [name], NP

## 2022-06-03 NOTE — Plan of Care (Signed)

## 2022-06-03 NOTE — Progress Notes (Signed)
Mobility Specialist - Progress Note   06/03/22 1445  Mobility  Activity Ambulated independently in hallway  Level of Assistance Independent  Assistive Device None  Distance Ambulated (ft) 320 ft  Activity Response Tolerated well  Mobility Referral Yes  $Mobility charge 1 Mobility  Mobility Specialist Start Time (ACUTE ONLY) 0153  Mobility Specialist Stop Time (ACUTE ONLY) 0207  Mobility Specialist Time Calculation (min) (ACUTE ONLY) 14 min   Pt sitting in recliner on RA upon arrival. Pt STS and ambulates 2 laps around NS indep. Pt returns to recliner with needs in reach and sitter present.   Terrilyn Saver  Mobility Specialist  06/03/22 2:46 PM

## 2022-06-04 DIAGNOSIS — B9620 Unspecified Escherichia coli [E. coli] as the cause of diseases classified elsewhere: Secondary | ICD-10-CM | POA: Diagnosis not present

## 2022-06-04 DIAGNOSIS — R7881 Bacteremia: Secondary | ICD-10-CM | POA: Diagnosis not present

## 2022-06-04 NOTE — Plan of Care (Signed)

## 2022-06-04 NOTE — Progress Notes (Signed)
[facility]  - [place] at [facility]   PATIENT NAME: Madeline Taylor    MR#:  [id number]  DATE OF BIRTH:  22-Apr-1935  SUBJECTIVE:  patient overall is calm pleasant. Ambulates without difficulty.  calm VITALS:  Blood pressure 137/72, pulse 72, temperature 97.7 F (36.5 C), temperature source Oral, resp. rate 19, height 4\' 11"  (1.499 m), weight 49.4 kg, SpO2 100 %.  PHYSICAL EXAMINATION:   GENERAL:  87 y.o.-year-old patient with no acute distress.  LUNGS: Normal breath sounds bilaterally, no wheezing CARDIOVASCULAR: S1, S2 normal. No murmur     NEUROLOGIC: nonfocal  patient is alert and awake. Dementia baseline  Assessment and Plan 87 yrs old female with medical history significant of dementia, hypertension, history of TIAs presenting with encephalopathy, hypothermia . Per daughter, patient had worsening dementia, frequently Lost walking out of the house.  But normally has good appetite   Vascular dementia (HCC) --Dementia with behavioral disturbance.  -- On Seroquel at night.   --Patient calm and cooperative and answers questions.   Bacteremia due to Escherichia coli --resoloved   Pancreatic pseudocyst -CT scan pancreatic protocol shows Pancreatic Pseudocyst. NO evidence of neoplasm --CA 19-9--wnl and CA-125--wnl    Essential hypertension --On amlodipine and losartan   History of TIA (transient ischemic attack) -MRI brain was negative for acute intracranial abnormality.    Facial bruising, sequela --improved     CODE STATUS:  full DVT Prophylaxis : ambulatory Level of care: MedSurg Status is: Inpatient Remains inpatient appropriate because: Difficult discharge disposition. Patient is medically stable for discharge    TOTAL TIME TAKING CARE OF THIS PATIENT: 25 minutes.  >50% time spent on counselling and coordination of care  Note: This dictation was prepared with Dragon dictation along with smaller phrase technology. Any transcriptional errors that  result from this process are unintentional.  Madeline Taylor M.D    [facility]   CC: Primary care physician; [name], NP

## 2022-06-05 DIAGNOSIS — F03918 Unspecified dementia, unspecified severity, with other behavioral disturbance: Secondary | ICD-10-CM | POA: Diagnosis not present

## 2022-06-05 MED ORDER — MUSCLE RUB 10-15 % EX CREA
TOPICAL_CREAM | CUTANEOUS | Status: DC | PRN
  Filled 2022-06-05 (×3): qty 85

## 2022-06-05 NOTE — Progress Notes (Signed)
[facility]  - [place] at [facility]   PATIENT NAME: Madeline Taylor    MR#:  [id number]  DATE OF BIRTH:  1935-09-10  SUBJECTIVE:  patient overall is calm pleasant. Ambulates without difficulty.  calm VITALS:  Blood pressure (!) 155/66, pulse 68, temperature 98.1 F (36.7 C), resp. rate 16, height 4\' 11"  (1.499 m), weight 49.4 kg, SpO2 98 %.  PHYSICAL EXAMINATION:   GENERAL:  87 y.o.-year-old patient with no acute distress.  LUNGS: Normal breath sounds bilaterally, no wheezing CARDIOVASCULAR: S1, S2 normal. No murmur     NEUROLOGIC: nonfocal  patient is alert and awake. Dementia baseline  Assessment and Plan 87 yrs old female with medical history significant of dementia, hypertension, history of TIAs presenting with encephalopathy, hypothermia . Per daughter, patient had worsening dementia, frequently Lost walking out of the house.  But normally has good appetite   Vascular dementia (HCC) --Dementia with behavioral disturbance.  -- On Seroquel at night.   --Patient calm and cooperative and answers questions.   Bacteremia due to Escherichia coli --resoloved   Pancreatic pseudocyst -CT scan pancreatic protocol shows Pancreatic Pseudocyst. NO evidence of neoplasm --CA 19-9--wnl and CA-125--wnl    Essential hypertension --On amlodipine and losartan   History of TIA (transient ischemic attack) -MRI brain was negative for acute intracranial abnormality.    Facial bruising, sequela --improved     CODE STATUS:  full DVT Prophylaxis : ambulatory Level of care: MedSurg Status is: Inpatient Remains inpatient appropriate because: Difficult discharge disposition. Patient is medically stable for discharge    TOTAL TIME TAKING CARE OF THIS PATIENT: 25 minutes.  >50% time spent on counselling and coordination of care  Note: This dictation was prepared with Dragon dictation along with smaller phrase technology. Any transcriptional errors that result from this  process are unintentional.  [name] M.D    [facility]   CC: Primary care physician; [name], NP

## 2022-06-05 NOTE — Progress Notes (Signed)
Mobility Specialist - Progress Note   06/05/22 0926  Mobility  Activity Ambulated independently in hallway  Level of Assistance Independent  Assistive Device None  Distance Ambulated (ft) 640 ft  Activity Response Tolerated well  Mobility Referral Yes  $Mobility charge 1 Mobility  Mobility Specialist Start Time (ACUTE ONLY) 0914  Mobility Specialist Stop Time (ACUTE ONLY) 0925  Mobility Specialist Time Calculation (min) (ACUTE ONLY) 11 min   Pt sitting in recliner on RA upon arrival. Pt STS and ambulates in hallway indep. Pt returns to recliner with needs in reach and sitter present.   Terrilyn Saver  Mobility Specialist  06/05/22 9:27 AM

## 2022-06-06 DIAGNOSIS — F03918 Unspecified dementia, unspecified severity, with other behavioral disturbance: Secondary | ICD-10-CM | POA: Diagnosis not present

## 2022-06-06 NOTE — Plan of Care (Signed)

## 2022-06-06 NOTE — Plan of Care (Signed)
  Problem: Clinical Measurements: Goal: Ability to maintain clinical measurements within normal limits will improve Outcome: Progressing Goal: Will remain free from infection Outcome: Progressing Goal: Diagnostic test results will improve Outcome: Progressing Goal: Respiratory complications will improve Outcome: Progressing Goal: Cardiovascular complication will be avoided Outcome: Progressing   Problem: Activity: Goal: Risk for activity intolerance will decrease Outcome: Progressing   Problem: Nutrition: Goal: Adequate nutrition will be maintained Outcome: Progressing   Problem: Coping: Goal: Level of anxiety will decrease Outcome: Progressing   Problem: Elimination: Goal: Will not experience complications related to bowel motility Outcome: Progressing Goal: Will not experience complications related to urinary retention Outcome: Progressing   Problem: Pain Managment: Goal: General experience of comfort will improve Outcome: Progressing   Problem: Safety: Goal: Ability to remain free from injury will improve Outcome: Progressing   Problem: Skin Integrity: Goal: Risk for impaired skin integrity will decrease Outcome: Progressing   Problem: Health Behavior/Discharge Planning: Goal: Ability to manage health-related needs will improve Outcome: Progressing   Problem: Education: Goal: Knowledge of General Education information will improve Description: Including pain rating scale, medication(s)/side effects and non-pharmacologic comfort measures Outcome: Progressing

## 2022-06-06 NOTE — Progress Notes (Signed)
[facility]  - [place] at [facility]   PATIENT NAME: Madeline Taylor    MR#:  [id number]  DATE OF BIRTH:  10-02-1935  SUBJECTIVE:  patient overall is calm pleasant. Ambulates without difficulty.  Sitter in the room VITALS:  Blood pressure 134/67, pulse 77, temperature 98.5 F (36.9 C), resp. rate 19, height 4\' 11"  (1.499 m), weight 49.4 kg, SpO2 100 %.  PHYSICAL EXAMINATION:   GENERAL:  87 y.o.-year-old patient with no acute distress.  LUNGS: Normal breath sounds bilaterally, no wheezing CARDIOVASCULAR: S1, S2 normal. No murmur     NEUROLOGIC: nonfocal  patient is alert and awake. Dementia baseline  Assessment and Plan 87 yrs old female with medical history significant of dementia, hypertension, history of TIAs presenting with encephalopathy, hypothermia . Per daughter, patient had worsening dementia, frequently Lost walking out of the house.  But normally has good appetite   Vascular dementia (HCC) --Dementia with behavioral disturbance.  -- On Seroquel at night.   --Patient calm and cooperative and answers questions.   Bacteremia due to Escherichia coli --resoloved   Pancreatic pseudocyst -CT scan pancreatic protocol shows Pancreatic Pseudocyst. NO evidence of neoplasm --CA 19-9--wnl and CA-125--wnl    Essential hypertension --On amlodipine and losartan   History of TIA (transient ischemic attack) -MRI brain was negative for acute intracranial abnormality.    Facial bruising, sequela --improved     CODE STATUS:  full DVT Prophylaxis : ambulatory Level of care: MedSurg Status is: Inpatient Remains inpatient appropriate because: Difficult discharge disposition. Patient is medically stable for discharge    TOTAL TIME TAKING CARE OF THIS PATIENT: 25 minutes.  >50% time spent on counselling and coordination of care  Note: This dictation was prepared with Dragon dictation along with smaller phrase technology. Any transcriptional errors that result from  this process are unintentional.  [name] M.D    [facility]   CC: Primary care physician; [name], NP

## 2022-06-07 DIAGNOSIS — F03918 Unspecified dementia, unspecified severity, with other behavioral disturbance: Secondary | ICD-10-CM | POA: Diagnosis not present

## 2022-06-07 NOTE — Plan of Care (Signed)

## 2022-06-07 NOTE — TOC Progression Note (Signed)
Transition of Care [facility]) - Progression Note    Patient Details  Name: Madeline Taylor MRN: [id number] Date of Birth: [birth year]  Transition of Care [facility]) CM/SW Contact  Darleene Cleaver, [place] Phone Number: 06/07/2022, 5:40 PM  Clinical Narrative:     [facility] emailed updated clinicals to APS worker Magazine features editor.  Court hearing for today for daughter to become legal guardian, awaiting for update from APS.  Still no Medicaid facilities.  Expected Discharge Plan: Long Term Nursing Home Barriers to Discharge: No SNF bed  Expected Discharge Plan and Services       Living arrangements for the past 2 months: Single Family Home                 DME Arranged: Walker rolling DME Agency: [employer] Date DME Agency Contacted: 04/25/22 Time DME Agency Contacted: [phone] Representative spoke with at DME Agency: [name]             Social Determinants of Health (SDOH) Interventions SDOH Screenings   Food Insecurity: Patient Unable To Answer (04/23/2022)  Housing: Low Risk  (04/23/2022)  Transportation Needs: No Transportation Needs (04/23/2022)  Utilities: Not At Risk (04/23/2022)  Alcohol Screen: Low Risk  (05/21/2021)  Depression (PHQ2-9): Low Risk  (05/21/2021)  Financial Resource Strain: Low Risk  (05/21/2021)  Physical Activity: Inactive (05/21/2021)  Social Connections: Moderately Integrated (05/21/2021)  Stress: No Stress Concern Present (05/21/2021)  Tobacco Use: Low Risk  (04/23/2022)    Readmission Risk Interventions     No data to display

## 2022-06-07 NOTE — Progress Notes (Signed)
[facility]  - [place] at [facility]   PATIENT NAME: Madeline Taylor    MR#:  [id number]  DATE OF BIRTH:  July 28, 1935  SUBJECTIVE:  patient overall is calm pleasant.  Sitter in the room VITALS:  Blood pressure 121/65, pulse (!) 59, temperature 99 F (37.2 C), temperature source Oral, resp. rate 16, height 4\' 11"  (1.499 m), weight 49.4 kg, SpO2 99 %.  PHYSICAL EXAMINATION:   GENERAL:  87 y.o.-year-old patient with no acute distress.  LUNGS: Normal breath sounds bilaterally, no wheezing CARDIOVASCULAR: S1, S2 normal. No murmur     NEUROLOGIC: nonfocal  patient is alert and awake. Dementia baseline  Assessment and Plan 86 yrs old female with medical history significant of dementia, hypertension, history of TIAs presenting with encephalopathy, hypothermia . Per daughter, patient had worsening dementia, frequently Lost walking out of the house.  But normally has good appetite   Vascular dementia (HCC) --Dementia with behavioral disturbance.  -- On Seroquel at night.   --Patient calm and cooperative and answers questions.   Bacteremia due to Escherichia coli --resoloved   Pancreatic pseudocyst -CT scan pancreatic protocol shows Pancreatic Pseudocyst. NO evidence of neoplasm --CA 19-9--wnl and CA-125--wnl    Essential hypertension --On amlodipine and losartan   History of TIA (transient ischemic attack) -MRI brain was negative for acute intracranial abnormality.    Facial bruising, sequela --improved     CODE STATUS:  full DVT Prophylaxis : ambulatory Level of care: MedSurg Status is: Inpatient Remains inpatient appropriate because: Difficult discharge disposition. Patient is medically stable for discharge    TOTAL TIME TAKING CARE OF THIS PATIENT: 25 minutes.  >50% time spent on counselling and coordination of care  Note: This dictation was prepared with Dragon dictation along with smaller phrase technology. Any transcriptional errors that result from  this process are unintentional.  [name] M.D    [facility]   CC: Primary care physician; [name], NP

## 2022-06-08 DIAGNOSIS — F03918 Unspecified dementia, unspecified severity, with other behavioral disturbance: Secondary | ICD-10-CM | POA: Diagnosis not present

## 2022-06-08 NOTE — Plan of Care (Signed)

## 2022-06-08 NOTE — Progress Notes (Signed)
[facility]  - [place] at [facility]   PATIENT NAME: Madeline Taylor    MR#:  [id number]  DATE OF BIRTH:  [birth year]  SUBJECTIVE:  patient overall is calm pleasant. When came to visit her today Sitter in the room VITALS:  Blood pressure (!) 149/68, pulse 64, temperature 97.7 F (36.5 C), resp. rate 17, height 4\' 11"  (1.499 m), weight 49.4 kg, SpO2 100 %.  PHYSICAL EXAMINATION:   GENERAL:  87 y.o.-year-old patient with no acute distress.  LUNGS: Normal breath sounds bilaterally, no wheezing CARDIOVASCULAR: S1, S2 normal. No murmur     NEUROLOGIC: nonfocal  patient is alert and awake. Dementia baseline  Assessment and Plan 87 yrs old female with medical history significant of dementia, hypertension, history of TIAs presenting with encephalopathy, hypothermia . Per daughter, patient had worsening dementia, frequently Lost walking out of the house.  But normally has good appetite   Vascular dementia (HCC) --Dementia with behavioral disturbance.  -- On Seroquel at night.   --Patient calm and cooperative and answers questions.   Bacteremia due to Escherichia coli --resoloved   Pancreatic pseudocyst -CT scan pancreatic protocol shows Pancreatic Pseudocyst. NO evidence of neoplasm --CA 19-9--wnl and CA-125--wnl    Essential hypertension --On amlodipine and losartan   History of TIA (transient ischemic attack) -MRI brain was negative for acute intracranial abnormality.       CODE STATUS:  full DVT Prophylaxis : ambulatory Level of care: MedSurg Status is: Inpatient Remains inpatient appropriate because: Difficult discharge disposition. Patient is medically stable for discharge    TOTAL TIME TAKING CARE OF THIS PATIENT: 25 minutes.  >50% time spent on counselling and coordination of care  Note: This dictation was prepared with Dragon dictation along with smaller phrase technology. Any transcriptional errors that result from this process are  unintentional.  [name] M.D    [facility]   CC: Primary care physician; [name], NP

## 2022-06-09 DIAGNOSIS — R7881 Bacteremia: Secondary | ICD-10-CM | POA: Diagnosis not present

## 2022-06-09 DIAGNOSIS — I10 Essential (primary) hypertension: Secondary | ICD-10-CM | POA: Diagnosis not present

## 2022-06-09 DIAGNOSIS — F01A18 Vascular dementia, mild, with other behavioral disturbance: Secondary | ICD-10-CM | POA: Diagnosis not present

## 2022-06-09 DIAGNOSIS — K863 Pseudocyst of pancreas: Secondary | ICD-10-CM | POA: Diagnosis not present

## 2022-06-09 NOTE — Progress Notes (Signed)
Progress Note   Patient: ABI BAAS GEX:[id number] DOB: 1935-12-18 DOA: 04/22/2022     47 DOS: the patient was seen and examined on 06/09/2022   Brief hospital course: 87 y.o. female with medical history significant of dementia, hypertension, history of TIAs presenting with encephalopathy, hypothermia  Per daughter, patient had worsening dementia, frequently Lost walking out of the house.  But normally has good appetite.  For the last week, patient had significant increased agitation, has not been sleeping well. She also had a positive COVID about a week ago, repeat COVID was negative at admission. Patient is seen by PT/OT, no need for nursing home placement.  However unsafe discharge.  The patient has vascular dementia and does not have the ability to make medical decisions or sign paperwork for herself at this time.  Evening of 4/16, patient had a fall out of bed and has bruising left frontal and around the left eye.  4/27 in the evening spiked a fever of 102.  Blood cultures positive for E. coli and Started empirically on antibiotics and IV fluids.   4/29.  CT scan shows a pancreatic pseudocyst.  Tumor markers negative.  Patient medically stable to go out to a facility but we do not have a place.   Assessment and Plan: * Vascular dementia (HCC) Dementia with behavioral disturbance.  On Seroquel at night.  Patient calm and cooperative and answers questions.  Patient has not been wandering.   Bacteremia due to Escherichia coli Resolved secondary to complete treatment.  Fever of 102 on 4/27.  Blood cultures positive for E. Coli secondary to urine.  Completed antibiotic course.  Pancreatic pseudocyst Seen on CT scan of the abdomen.  Tumor markers negative.  Essential hypertension On amlodipine and losartan   History of TIA (transient ischemic attack) MRI brain was negative for acute intracranial abnormality.  Hypothermia Resolved.  Hyponatremia Last sodium normal  range.  Facial bruising, sequela Resolved.  Metabolic acidosis Improved        Subjective: Patient feels fine.  Offers no complaints.  Admitted 47 days ago secondary to altered mental status and for placement.  Physical Exam: Vitals:   06/07/22 1711 06/08/22 1022 06/08/22 1405 06/09/22 0912  BP: (!) 135/59 128/68 (!) 149/68 128/63  Pulse: 73 66 64 68  Resp: 18 16 17 17   Temp: 99.2 F (37.3 C) 98.7 F (37.1 C) 97.7 F (36.5 C) 97.7 F (36.5 C)  TempSrc: Oral     SpO2: 97% 99% 100% 100%  Weight:      Height:       Physical Exam HENT:     Head: Normocephalic.     Mouth/Throat:     Pharynx: No oropharyngeal exudate.  Eyes:     General: Lids are normal.     Conjunctiva/sclera: Conjunctivae normal.  Cardiovascular:     Rate and Rhythm: Normal rate and regular rhythm.     Heart sounds: Normal heart sounds, S1 normal and S2 normal.  Pulmonary:     Breath sounds: No decreased breath sounds, wheezing, rhonchi or rales.  Abdominal:     Palpations: Abdomen is soft.     Tenderness: There is no abdominal tenderness.  Musculoskeletal:     Right lower leg: No swelling.     Left lower leg: No swelling.  Skin:    General: Skin is warm.     Findings: No rash.  Neurological:     Mental Status: She is alert.     Comments: Answers simple questions  without issue.     Data Reviewed: Last hemoglobin 11.4  Family Communication: Updated patient's daughter on the phone  Disposition: Status is: Inpatient Remains inpatient appropriate because: We do not have a safe disposition  Planned Discharge Destination: To be determined    Time spent: 28 minutes  Author: Alford Highland, MD 06/09/2022 3:35 PM  For on call review [email or web address].

## 2022-06-09 NOTE — Progress Notes (Signed)
Mobility Specialist - Progress Note   06/09/22 1001  Mobility  Activity Ambulated independently in hallway;Stood at bedside;Dangled on edge of bed  Level of Assistance Independent  Assistive Device None  Distance Ambulated (ft) 500 ft  Activity Response Tolerated well  Mobility Referral Yes  $Mobility charge 1 Mobility  Mobility Specialist Start Time (ACUTE ONLY) 0944  Mobility Specialist Stop Time (ACUTE ONLY) 0956  Mobility Specialist Time Calculation (min) (ACUTE ONLY) 12 min   Pt sitting EOB on RA upon arrival. Pt STS and ambulates in hallway indep. Pt returns to room with needs in reach and sitter present.   Terrilyn Saver  Mobility Specialist  06/09/22 10:02 AM

## 2022-06-09 NOTE — Plan of Care (Signed)

## 2022-06-09 NOTE — TOC Progression Note (Signed)
Transition of Care [facility]) - Progression Note    Patient Details  Name: Madeline Taylor MRN: [id number] Date of Birth: 09-24-1935  Transition of Care [facility]) CM/SW Contact  Darleene Cleaver, [place] Phone Number: 06/09/2022, 7:30 PM  Clinical Narrative:     CSW sent email to DSS to find out status of guardianship hearing from Monday.  Expected Discharge Plan: Long Term Nursing Home Barriers to Discharge: No SNF bed  Expected Discharge Plan and Services       Living arrangements for the past 2 months: Single Family Home                 DME Arranged: Walker rolling DME Agency: [employer] Date DME Agency Contacted: 04/25/22 Time DME Agency Contacted: [phone] Representative spoke with at DME Agency: [name]             Social Determinants of Health (SDOH) Interventions SDOH Screenings   Food Insecurity: Patient Unable To Answer (04/23/2022)  Housing: Low Risk  (04/23/2022)  Transportation Needs: No Transportation Needs (04/23/2022)  Utilities: Not At Risk (04/23/2022)  Alcohol Screen: Low Risk  (05/21/2021)  Depression (PHQ2-9): Low Risk  (05/21/2021)  Financial Resource Strain: Low Risk  (05/21/2021)  Physical Activity: Inactive (05/21/2021)  Social Connections: Moderately Integrated (05/21/2021)  Stress: No Stress Concern Present (05/21/2021)  Tobacco Use: Low Risk  (04/23/2022)    Readmission Risk Interventions     No data to display

## 2022-06-10 DIAGNOSIS — F01B18 Vascular dementia, moderate, with other behavioral disturbance: Secondary | ICD-10-CM | POA: Diagnosis not present

## 2022-06-10 DIAGNOSIS — I10 Essential (primary) hypertension: Secondary | ICD-10-CM | POA: Diagnosis not present

## 2022-06-10 DIAGNOSIS — R7881 Bacteremia: Secondary | ICD-10-CM | POA: Diagnosis not present

## 2022-06-10 DIAGNOSIS — K863 Pseudocyst of pancreas: Secondary | ICD-10-CM | POA: Diagnosis not present

## 2022-06-10 NOTE — Progress Notes (Signed)
Progress Note   Patient: Madeline Taylor:[id number] DOB: 09-10-1935 DOA: 04/22/2022     48 DOS: the patient was seen and examined on 06/10/2022   Brief hospital course: 87 y.o. female with medical history significant of dementia, hypertension, history of TIAs presenting with encephalopathy, hypothermia  Per daughter, patient had worsening dementia, frequently Lost walking out of the house.  But normally has good appetite.  For the last week, patient had significant increased agitation, has not been sleeping well. She also had a positive COVID about a week ago, repeat COVID was negative at admission. Patient is seen by PT/OT, no need for nursing home placement.  However unsafe discharge.  The patient has vascular dementia and does not have the ability to make medical decisions or sign paperwork for herself at this time.  Evening of 4/16, patient had a fall out of bed and has bruising left frontal and around the left eye.  4/27 in the evening spiked a fever of 102.  Blood cultures positive for E. coli and Started empirically on antibiotics and IV fluids.   4/29.  CT scan shows a pancreatic pseudocyst.  Tumor markers negative.  Patient medically stable to go out to a facility but we do not have a place.   Assessment and Plan: * Vascular dementia (HCC) Dementia with behavioral disturbance.  On Seroquel at night.  Patient calm and cooperative and answers questions.  Patient has not been wandering.   Bacteremia due to Escherichia coli Resolved secondary to complete treatment.  Fever of 102 on 4/27.  Blood cultures positive for E. Coli secondary to urine.  Completed antibiotic course.  Pancreatic pseudocyst Seen on CT scan of the abdomen.  Tumor markers negative.  Essential hypertension On amlodipine and losartan   History of TIA (transient ischemic attack) MRI brain was negative for acute intracranial abnormality.  Hypothermia Resolved.  Hyponatremia Last sodium normal  range.  Facial bruising, sequela improved  Metabolic acidosis Improved        Subjective: Patient feels okay. Admitted initially with altered mental status.  Physical Exam: Vitals:   06/08/22 1405 06/09/22 0912 06/09/22 1653 06/10/22 0916  BP: (!) 149/68 128/63 (!) 147/66 (!) 132/53  Pulse: 64 68 72 75  Resp: 17 17 17 18   Temp: 97.7 F (36.5 C) 97.7 F (36.5 C) 97.7 F (36.5 C)   TempSrc:      SpO2: 100% 100% 98% 100%  Weight:      Height:       Physical Exam HENT:     Head: Normocephalic.     Mouth/Throat:     Pharynx: No oropharyngeal exudate.  Eyes:     General: Lids are normal.     Conjunctiva/sclera: Conjunctivae normal.  Cardiovascular:     Rate and Rhythm: Normal rate and regular rhythm.     Heart sounds: Normal heart sounds, S1 normal and S2 normal.  Pulmonary:     Breath sounds: No decreased breath sounds, wheezing, rhonchi or rales.  Abdominal:     Palpations: Abdomen is soft.     Tenderness: There is no abdominal tenderness.  Musculoskeletal:     Right lower leg: No swelling.     Left lower leg: No swelling.  Skin:    General: Skin is warm.     Findings: No rash.  Neurological:     Mental Status: She is alert.     Comments: Answers simple questions without issue.     Data Reviewed: No new data  Family  Communication: updated daughter yesterday  Disposition: Status is: Inpatient Remains inpatient appropriate because: we do not have a safe disposition  Planned Discharge Destination: TBD    Time spent: 25 minutes  Author: Alford Highland, MD 06/10/2022 2:02 PM  For on call review [email or web address].

## 2022-06-10 NOTE — Progress Notes (Signed)
Mobility Specialist - Progress Note   06/10/22 1342  Mobility  Activity Ambulated independently in hallway  Level of Assistance Independent  Assistive Device None  Distance Ambulated (ft) 480 ft  Activity Response Tolerated well  Mobility Referral Yes  $Mobility charge 1 Mobility  Mobility Specialist Start Time (ACUTE ONLY) 1305  Mobility Specialist Stop Time (ACUTE ONLY) 1313  Mobility Specialist Time Calculation (min) (ACUTE ONLY) 8 min   Pt OOB in Hallway on RA upon arrival. Pt ambulates 3 laps indep. Pt left EOB with needs in reach and sitter present.   Terrilyn Saver  Mobility Specialist  06/10/22 1:43 PM

## 2022-06-10 NOTE — TOC Progression Note (Signed)
Transition of Care [facility]) - Progression Note    Patient Details  Name: Madeline Taylor MRN: [id number] Date of Birth: 1935-08-09  Transition [facility] Care [facility]) CM/SW Contact  Darleene Cleaver, [place] Phone Number: 06/10/2022, 2:22 PM  Clinical Narrative:     CSW attempted to contact APS worker Nancy Fetter, [phone] to get an update on the guardian hearing for patient that was completed on Monday.  CSW left a message on voice mail awaiting for a call back.  Expected Discharge Plan: Long Term Nursing Home Barriers to Discharge: No SNF bed  Expected Discharge Plan and Services  Memory Care facility once bed and facility is available.     Living arrangements for the past 2 months: Single Family Home                 DME Arranged: Walker rolling DME Agency: [employer] Date DME Agency Contacted: 04/25/22 Time DME Agency Contacted: [phone] Representative spoke with at DME Agency: [name]             Social Determinants of Health (SDOH) Interventions SDOH Screenings   Food Insecurity: Patient Unable To Answer (04/23/2022)  Housing: Low Risk  (04/23/2022)  Transportation Needs: No Transportation Needs (04/23/2022)  Utilities: Not At Risk (04/23/2022)  Alcohol Screen: Low Risk  (05/21/2021)  Depression (PHQ2-9): Low Risk  (05/21/2021)  Financial Resource Strain: Low Risk  (05/21/2021)  Physical Activity: Inactive (05/21/2021)  Social Connections: Moderately Integrated (05/21/2021)  Stress: No Stress Concern Present (05/21/2021)  Tobacco Use: Low Risk  (04/23/2022)    Readmission Risk Interventions     No data to display

## 2022-06-10 NOTE — Plan of Care (Signed)

## 2022-06-11 DIAGNOSIS — K863 Pseudocyst of pancreas: Secondary | ICD-10-CM | POA: Diagnosis not present

## 2022-06-11 DIAGNOSIS — I10 Essential (primary) hypertension: Secondary | ICD-10-CM | POA: Diagnosis not present

## 2022-06-11 DIAGNOSIS — R7881 Bacteremia: Secondary | ICD-10-CM | POA: Diagnosis not present

## 2022-06-11 DIAGNOSIS — F01B18 Vascular dementia, moderate, with other behavioral disturbance: Secondary | ICD-10-CM | POA: Diagnosis not present

## 2022-06-11 NOTE — Plan of Care (Signed)

## 2022-06-11 NOTE — Progress Notes (Signed)
Gave report to charge nurse regarding bedtime meds. Pt attempted to "take meds" but kept meds in her hand. I gently asked to hold hands to say good night and found 1 pill. I asked gently again to take. Pt was uncertain of bedtime meds, avoided answers to agitate patient. Advised she takes her meds every day and assured nothing has changed.

## 2022-06-11 NOTE — Progress Notes (Signed)
Progress Note   Patient: Madeline Taylor ZHY:[id number] DOB: 1935-04-26 DOA: 04/22/2022     49 DOS: the patient was seen and examined on 06/11/2022   Brief hospital course: 87 y.o. female with medical history significant of dementia, hypertension, history of TIAs presenting with encephalopathy, hypothermia  Per daughter, patient had worsening dementia, frequently Lost walking out of the house.  But normally has good appetite.  For the last week, patient had significant increased agitation, has not been sleeping well. She also had a positive COVID about a week ago, repeat COVID was negative at admission. Patient is seen by PT/OT, no need for nursing home placement.  However unsafe discharge.  The patient has vascular dementia and does not have the ability to make medical decisions or sign paperwork for herself at this time.  Evening of 4/16, patient had a fall out of bed and has bruising left frontal and around the left eye.  4/27 in the evening spiked a fever of 102.  Blood cultures positive for E. coli and Started empirically on antibiotics and IV fluids.   4/29.  CT scan shows a pancreatic pseudocyst.  Tumor markers negative.  Patient medically stable to go out to a facility but we do not have a place.   Assessment and Plan: * Vascular dementia (HCC) Dementia with behavioral disturbance.  On Seroquel at night.  Patient calm and cooperative and answers questions.  Patient has not been wandering.   Bacteremia due to Escherichia coli Resolved secondary to complete treatment.  Fever of 102 on 4/27.  Blood cultures positive for E. Coli secondary to urine.  Completed antibiotic course.  Pancreatic pseudocyst Seen on CT scan of the abdomen.  Tumor markers negative.  Essential hypertension On amlodipine and losartan   History of TIA (transient ischemic attack) MRI brain was negative for acute intracranial abnormality.  Hypothermia Resolved.  Hyponatremia Last sodium normal  range.  Facial bruising, sequela improved  Metabolic acidosis Improved        Subjective: Patient feels fine.  Offers no complaints.  Admitted 49 days ago with altered mental status.  Physical Exam: Vitals:   06/10/22 0916 06/10/22 2357 06/11/22 0523 06/11/22 0818  BP: (!) 132/53 (!) 101/51 123/70 (!) 153/66  Pulse: 75 71 (!) 52 62  Resp: 18 17 18 19   Temp:  97.9 F (36.6 C) 97.9 F (36.6 C) 97.9 F (36.6 C)  TempSrc:  Oral Oral   SpO2: 100% 96% 99% 100%  Weight:      Height:       Physical Exam HENT:     Head: Normocephalic.     Mouth/Throat:     Pharynx: No oropharyngeal exudate.  Eyes:     General: Lids are normal.     Conjunctiva/sclera: Conjunctivae normal.  Cardiovascular:     Rate and Rhythm: Normal rate and regular rhythm.     Heart sounds: Normal heart sounds, S1 normal and S2 normal.  Pulmonary:     Breath sounds: No decreased breath sounds, wheezing, rhonchi or rales.  Abdominal:     Palpations: Abdomen is soft.     Tenderness: There is no abdominal tenderness.  Musculoskeletal:     Right lower leg: No swelling.     Left lower leg: No swelling.  Skin:    General: Skin is warm.     Findings: No rash.  Neurological:     Mental Status: She is alert.     Comments: Answers simple questions without issue.  Data Reviewed: No new data   Disposition: Status is: Inpatient Remains inpatient appropriate because: No disposition plans at this time.  Unsafe discharge.  Patient's daughter the other day states that APS is the guardian  Planned Discharge Destination: To be determined    Time spent: 28 minutes  Author: Alford Highland, MD 06/11/2022 1:39 PM  For on call review [email or web address].

## 2022-06-11 NOTE — TOC Progression Note (Signed)
Transition of Care [facility]) - Progression Note    Patient Details  Name: Madeline Taylor MRN: [id number] Date of Birth: Sep 29, 1935  Transition of Care [facility]) CM/SW Contact  Darleene Cleaver, [place] Phone Number: 06/11/2022, 5:53 PM  Clinical Narrative:     Per Theodis Shove from [facility], she thinks DSS is legal guardian for patient now.  She will confirm with her supervisor Sharlyne Cai next week and let CSW know.  Expected Discharge Plan: Long Term Nursing Home Barriers to Discharge: No SNF bed  Expected Discharge Plan and Services       Living arrangements for the past 2 months: Single Family Home                 DME Arranged: Walker rolling DME Agency: [employer] Date DME Agency Contacted: 04/25/22 Time DME Agency Contacted: [phone] Representative spoke with at DME Agency: [name]             Social Determinants of Health (SDOH) Interventions SDOH Screenings   Food Insecurity: Patient Unable To Answer (04/23/2022)  Housing: Low Risk  (04/23/2022)  Transportation Needs: No Transportation Needs (04/23/2022)  Utilities: Not At Risk (04/23/2022)  Alcohol Screen: Low Risk  (05/21/2021)  Depression (PHQ2-9): Low Risk  (05/21/2021)  Financial Resource Strain: Low Risk  (05/21/2021)  Physical Activity: Inactive (05/21/2021)  Social Connections: Moderately Integrated (05/21/2021)  Stress: No Stress Concern Present (05/21/2021)  Tobacco Use: Low Risk  (04/23/2022)    Readmission Risk Interventions     No data to display

## 2022-06-11 NOTE — Plan of Care (Signed)
  Problem: Education: Goal: Knowledge of General Education information will improve Description: Including pain rating scale, medication(s)/side effects and non-pharmacologic comfort measures 06/11/2022 1926 by Cathren Harsh, RN Outcome: Progressing 06/11/2022 1926 by Cathren Harsh, RN Outcome: Progressing   Problem: Health Behavior/Discharge Planning: Goal: Ability to manage health-related needs will improve 06/11/2022 1926 by Cathren Harsh, RN Outcome: Progressing 06/11/2022 1926 by Cathren Harsh, RN Outcome: Progressing   Problem: Clinical Measurements: Goal: Ability to maintain clinical measurements within normal limits will improve 06/11/2022 1926 by Cathren Harsh, RN Outcome: Progressing 06/11/2022 1926 by Cathren Harsh, RN Outcome: Progressing Goal: Will remain free from infection 06/11/2022 1926 by Cathren Harsh, RN Outcome: Progressing 06/11/2022 1926 by Cathren Harsh, RN Outcome: Progressing Goal: Diagnostic test results will improve 06/11/2022 1926 by Cathren Harsh, RN Outcome: Progressing 06/11/2022 1926 by Cathren Harsh, RN Outcome: Progressing Goal: Respiratory complications will improve 06/11/2022 1926 by Cathren Harsh, RN Outcome: Progressing 06/11/2022 1926 by Cathren Harsh, RN Outcome: Progressing Goal: Cardiovascular complication will be avoided 06/11/2022 1926 by Cathren Harsh, RN Outcome: Progressing 06/11/2022 1926 by Cathren Harsh, RN Outcome: Progressing   Problem: Activity: Goal: Risk for activity intolerance will decrease 06/11/2022 1926 by Cathren Harsh, RN Outcome: Progressing 06/11/2022 1926 by Cathren Harsh, RN Outcome: Progressing   Problem: Nutrition: Goal: Adequate nutrition will be maintained 06/11/2022 1926 by Cathren Harsh, RN Outcome: Progressing 06/11/2022 1926 by Cathren Harsh, RN Outcome: Progressing   Problem: Coping: Goal: Level of anxiety will  decrease 06/11/2022 1926 by Cathren Harsh, RN Outcome: Progressing 06/11/2022 1926 by Cathren Harsh, RN Outcome: Progressing   Problem: Elimination: Goal: Will not experience complications related to bowel motility 06/11/2022 1926 by Cathren Harsh, RN Outcome: Progressing 06/11/2022 1926 by Cathren Harsh, RN Outcome: Progressing Goal: Will not experience complications related to urinary retention 06/11/2022 1926 by Cathren Harsh, RN Outcome: Progressing 06/11/2022 1926 by Cathren Harsh, RN Outcome: Progressing   Problem: Pain Managment: Goal: General experience of comfort will improve 06/11/2022 1926 by Cathren Harsh, RN Outcome: Progressing 06/11/2022 1926 by Cathren Harsh, RN Outcome: Progressing   Problem: Safety: Goal: Ability to remain free from injury will improve 06/11/2022 1926 by Cathren Harsh, RN Outcome: Progressing 06/11/2022 1926 by Cathren Harsh, RN Outcome: Progressing   Problem: Skin Integrity: Goal: Risk for impaired skin integrity will decrease 06/11/2022 1926 by Cathren Harsh, RN Outcome: Progressing 06/11/2022 1926 by Cathren Harsh, RN Outcome: Progressing

## 2022-06-12 DIAGNOSIS — R7881 Bacteremia: Secondary | ICD-10-CM | POA: Diagnosis not present

## 2022-06-12 DIAGNOSIS — I10 Essential (primary) hypertension: Secondary | ICD-10-CM | POA: Diagnosis not present

## 2022-06-12 DIAGNOSIS — F01B18 Vascular dementia, moderate, with other behavioral disturbance: Secondary | ICD-10-CM | POA: Diagnosis not present

## 2022-06-12 DIAGNOSIS — K863 Pseudocyst of pancreas: Secondary | ICD-10-CM | POA: Diagnosis not present

## 2022-06-12 LAB — CBC
HCT: 35 % — ABNORMAL LOW (ref 36.0–46.0)
Hemoglobin: 11.2 g/dL — ABNORMAL LOW (ref 12.0–15.0)
MCH: 29.9 pg (ref 26.0–34.0)
MCHC: 32 g/dL (ref 30.0–36.0)
MCV: 93.3 fL (ref 80.0–100.0)
Platelets: 202 10*3/uL (ref 150–400)
RBC: 3.75 MIL/uL — ABNORMAL LOW (ref 3.87–5.11)
RDW: 13.2 % (ref 11.5–15.5)
WBC: 4.7 10*3/uL (ref 4.0–10.5)
nRBC: 0 % (ref 0.0–0.2)

## 2022-06-12 LAB — BASIC METABOLIC PANEL
Anion gap: 8 (ref 5–15)
BUN: 25 mg/dL — ABNORMAL HIGH (ref 8–23)
CO2: 21 mmol/L — ABNORMAL LOW (ref 22–32)
Calcium: 8.9 mg/dL (ref 8.9–10.3)
Chloride: 111 mmol/L (ref 98–111)
Creatinine, Ser: 0.64 mg/dL (ref 0.44–1.00)
GFR, Estimated: >60 mL/min (ref >60)
Glucose, Bld: 88 mg/dL (ref 70–99)
Potassium: 4 mmol/L (ref 3.5–5.1)
Sodium: 140 mmol/L (ref 135–145)

## 2022-06-12 NOTE — Plan of Care (Signed)

## 2022-06-12 NOTE — Progress Notes (Signed)
Progress Note   Patient: Madeline Taylor ZOX:[id number] DOB: [birth year] DOA: 04/22/2022     50 DOS: the patient was seen and examined on 06/12/2022   Brief hospital course: 87 y.o. female with medical history significant of dementia, hypertension, history of TIAs presenting with encephalopathy, hypothermia  Per daughter, patient had worsening dementia, frequently Lost walking out of the house.  But normally has good appetite.  For the last week, patient had significant increased agitation, has not been sleeping well. She also had a positive COVID about a week ago, repeat COVID was negative at admission. Patient is seen by PT/OT, no need for nursing home placement.  However unsafe discharge.  The patient has vascular dementia and does not have the ability to make medical decisions or sign paperwork for herself at this time.  Evening of 4/16, patient had a fall out of bed and has bruising left frontal and around the left eye.  4/27 in the evening spiked a fever of 102.  Blood cultures positive for E. coli and Started empirically on antibiotics and IV fluids.   4/29.  CT scan shows a pancreatic pseudocyst.  Tumor markers negative.  Patient medically stable to go out to a facility but we do not have a place.   Assessment and Plan: * Vascular dementia (HCC) Dementia with behavioral disturbance.  On Seroquel at night.  Patient calm and cooperative and answers questions.  Patient has not been wandering.   Bacteremia due to Escherichia coli Resolved secondary to complete treatment.  Fever of 102 on 4/27.  Blood cultures positive for E. Coli secondary to urine.  Completed antibiotic course.  Pancreatic pseudocyst Seen on CT scan of the abdomen.  Tumor markers negative.  Essential hypertension On amlodipine and losartan   History of TIA (transient ischemic attack) MRI brain was negative for acute intracranial abnormality.  Hypothermia Resolved.  Hyponatremia Last sodium normal  range.  Facial bruising, sequela improved  Metabolic acidosis Improved        Subjective: Patient feels okay.  Never offers any complaints.  Patient husband at the bedside and had a a lot of questions.  Admitted with unsafe living conditions.  Physical Exam: Vitals:   06/11/22 0818 06/11/22 1544 06/12/22 0106 06/12/22 0721  BP: (!) 153/66 (!) 145/67 (!) 187/69 (!) 125/59  Pulse: 62 72 67 (!) 52  Resp: 19 16 15 18   Temp: 97.9 F (36.6 C) 98 F (36.7 C) 97.8 F (36.6 C) (!) 96.6 F (35.9 C)  TempSrc:      SpO2: 100% 100% 100% 100%  Weight:      Height:       Physical Exam HENT:     Head: Normocephalic.     Mouth/Throat:     Pharynx: No oropharyngeal exudate.  Eyes:     General: Lids are normal.     Conjunctiva/sclera: Conjunctivae normal.  Cardiovascular:     Rate and Rhythm: Normal rate and regular rhythm.     Heart sounds: Normal heart sounds, S1 normal and S2 normal.  Pulmonary:     Breath sounds: No decreased breath sounds, wheezing, rhonchi or rales.  Abdominal:     Palpations: Abdomen is soft.     Tenderness: There is no abdominal tenderness.  Musculoskeletal:     Right lower leg: No swelling.     Left lower leg: No swelling.  Skin:    General: Skin is warm.     Findings: No rash.  Neurological:     Mental Status:  She is alert.     Comments: Answers simple questions without issue.     Data Reviewed: Creatinine 0.64, hemoglobin 11.2  Family Communication: Patient's husband at the bedside  Disposition: Status is: Inpatient Remains inpatient appropriate because: We do not have a safe disposition  Planned Discharge Destination: To be determined    Time spent: 27 minutes  Author: Alford Highland, MD 06/12/2022 1:53 PM  For on call review [email or web address].

## 2022-06-12 NOTE — Plan of Care (Signed)

## 2022-06-12 NOTE — Progress Notes (Signed)
Mobility Specialist - Progress Note    06/12/22 1402  Mobility  Activity Ambulated independently in hallway;Stood at bedside;Dangled on edge of bed  Level of Assistance Independent  Assistive Device Front wheel walker  Distance Ambulated (ft) 480 ft  Activity Response Tolerated well  Mobility Referral Yes  $Mobility charge 1 Mobility  Mobility Specialist Start Time (ACUTE ONLY) 0128  Mobility Specialist Stop Time (ACUTE ONLY) 0137  Mobility Specialist Time Calculation (min) (ACUTE ONLY) 9 min   Pt semi-supine in bed on RA upon arrival. Pt STS and ambulates in hallway indep. Pt returns to bed with needs in reach and sitter present.   Madeline Taylor  Mobility Specialist  06/12/22 2:04 PM

## 2022-06-13 DIAGNOSIS — F01B18 Vascular dementia, moderate, with other behavioral disturbance: Secondary | ICD-10-CM | POA: Diagnosis not present

## 2022-06-13 DIAGNOSIS — R7881 Bacteremia: Secondary | ICD-10-CM | POA: Diagnosis not present

## 2022-06-13 DIAGNOSIS — K863 Pseudocyst of pancreas: Secondary | ICD-10-CM | POA: Diagnosis not present

## 2022-06-13 DIAGNOSIS — I10 Essential (primary) hypertension: Secondary | ICD-10-CM | POA: Diagnosis not present

## 2022-06-13 NOTE — Progress Notes (Signed)
Mobility Specialist - Progress Note   06/13/22 0845  Mobility  Activity Ambulated independently in hallway  Level of Assistance Independent  Assistive Device None  Distance Ambulated (ft) 480 ft  Activity Response Tolerated well  Mobility Referral Yes  $Mobility charge 1 Mobility  Mobility Specialist Start Time (ACUTE ONLY) [id number]  Mobility Specialist Stop Time (ACUTE ONLY) 0841  Mobility Specialist Time Calculation (min) (ACUTE ONLY) 9 min   Pt sitting in recliner on RA upon arrival. Pt STS and ambulates in hallway indep. Pt returns to recliner with needs in reach and sitter present.   Terrilyn Saver  Mobility Specialist  06/13/22 8:46 AM

## 2022-06-13 NOTE — Plan of Care (Signed)

## 2022-06-13 NOTE — Progress Notes (Signed)
Progress Note   Patient: Madeline Taylor WUJ:[id number] DOB: 1935-05-25 DOA: 04/22/2022     51 DOS: the patient was seen and examined on 06/13/2022   Brief hospital course: 87 y.o. female with medical history significant of dementia, hypertension, history of TIAs presenting with encephalopathy, hypothermia  Per daughter, patient had worsening dementia, frequently Lost walking out of the house.  But normally has good appetite.  For the last week, patient had significant increased agitation, has not been sleeping well. She also had a positive COVID about a week ago, repeat COVID was negative at admission. Patient is seen by PT/OT, no need for nursing home placement.  However unsafe discharge.  The patient has vascular dementia and does not have the ability to make medical decisions or sign paperwork for herself at this time.  Evening of 4/16, patient had a fall out of bed and has bruising left frontal and around the left eye.  4/27 in the evening spiked a fever of 102.  Blood cultures positive for E. coli and Started empirically on antibiotics and IV fluids.   4/29.  CT scan shows a pancreatic pseudocyst.  Tumor markers negative.  Patient medically stable to go out to a facility but we do not have a place.   Assessment and Plan: * Vascular dementia (HCC) Dementia with behavioral disturbance.  On Seroquel at night.  Patient calm and cooperative and answers questions.  Patient has not been wandering.   Bacteremia due to Escherichia coli Resolved secondary to complete treatment.  Fever of 102 on 4/27.  Blood cultures positive for E. Coli secondary to urine.  Completed antibiotic course.  Pancreatic pseudocyst Seen on CT scan of the abdomen.  Tumor markers negative.  Essential hypertension On amlodipine and losartan   History of TIA (transient ischemic attack) MRI brain was negative for acute intracranial abnormality.  Hypothermia Resolved.  Hyponatremia Last sodium normal  range.  Facial bruising, sequela improved  Metabolic acidosis Improved        Subjective: Patient feels okay.  Offers no complaints.  Admitted 51 days ago with encephalopathy.  Physical Exam: Vitals:   06/12/22 1523 06/12/22 2339 06/13/22 0923 06/13/22 1029  BP: 123/64 126/87 (!) 107/48 (!) 132/54  Pulse: 73 68 80   Resp: 18 15 18    Temp: 98 F (36.7 C) 98.2 F (36.8 C) 99.3 F (37.4 C)   TempSrc:      SpO2: 99% 99% 99%   Weight:      Height:       Physical Exam HENT:     Head: Normocephalic.     Mouth/Throat:     Pharynx: No oropharyngeal exudate.  Eyes:     General: Lids are normal.     Conjunctiva/sclera: Conjunctivae normal.  Cardiovascular:     Rate and Rhythm: Normal rate and regular rhythm.     Heart sounds: Normal heart sounds, S1 normal and S2 normal.  Pulmonary:     Breath sounds: No decreased breath sounds, wheezing, rhonchi or rales.  Abdominal:     Palpations: Abdomen is soft.     Tenderness: There is no abdominal tenderness.  Musculoskeletal:     Right lower leg: No swelling.     Left lower leg: No swelling.  Skin:    General: Skin is warm.     Findings: No rash.  Neurological:     Mental Status: She is alert.     Comments: Answers simple questions without issue.     Data Reviewed: Laboratory  data reviewed from yesterday.  Family Communication: Updated patient's husband at the bedside yesterday.  Disposition: Status is: Inpatient Remains inpatient appropriate because: We do not have a safe disposition.   Planned Discharge Destination: To be determined    Time spent: 28 minutes  Author: Alford Highland, MD 06/13/2022 2:09 PM  For on call review [email or web address].

## 2022-06-14 DIAGNOSIS — R7881 Bacteremia: Secondary | ICD-10-CM | POA: Diagnosis not present

## 2022-06-14 DIAGNOSIS — I10 Essential (primary) hypertension: Secondary | ICD-10-CM | POA: Diagnosis not present

## 2022-06-14 DIAGNOSIS — K863 Pseudocyst of pancreas: Secondary | ICD-10-CM | POA: Diagnosis not present

## 2022-06-14 DIAGNOSIS — F01B18 Vascular dementia, moderate, with other behavioral disturbance: Secondary | ICD-10-CM | POA: Diagnosis not present

## 2022-06-14 NOTE — Progress Notes (Signed)
Progress Note   Patient: Madeline Taylor ZOX:[id number] DOB: 06/22/1935 DOA: 04/22/2022     52 DOS: the patient was seen and examined on 06/14/2022   Brief hospital course: 87 y.o. female with medical history significant of dementia, hypertension, history of TIAs presenting with encephalopathy, hypothermia  Per daughter, patient had worsening dementia, frequently Lost walking out of the house.  But normally has good appetite.  For the last week, patient had significant increased agitation, has not been sleeping well. She also had a positive COVID about a week ago, repeat COVID was negative at admission. Patient is seen by PT/OT, no need for nursing home placement.  However unsafe discharge.  The patient has vascular dementia and does not have the ability to make medical decisions or sign paperwork for herself at this time.  Evening of 4/16, patient had a fall out of bed and has bruising left frontal and around the left eye.  4/27 in the evening spiked a fever of 102.  Blood cultures positive for E. coli and Started empirically on antibiotics and IV fluids.   4/29.  CT scan shows a pancreatic pseudocyst.  Tumor markers negative.  Patient medically stable to go out to a facility but we do not have a place.   Assessment and Plan: * Vascular dementia (HCC) Dementia with behavioral disturbance.  On Seroquel at night.  Patient calm and cooperative and answers questions.  Patient has not been wandering.   Bacteremia due to Escherichia coli Resolved secondary to complete treatment.  Fever of 102 on 4/27.  Blood cultures positive for E. Coli secondary to urine.  Completed antibiotic course.  Pancreatic pseudocyst Seen on CT scan of the abdomen.  Tumor markers negative.  Essential hypertension On amlodipine and losartan   History of TIA (transient ischemic attack) MRI brain was negative for acute intracranial abnormality.  Hypothermia Resolved.  Hyponatremia Last sodium normal  range.  Facial bruising, sequela improved  Metabolic acidosis Improved        Subjective: Patient wondering why she has a Comptroller.  Feels fine.  Offers no complaints.  Admitted 52 days ago with altered mental status.  Physical Exam: Vitals:   06/13/22 1029 06/13/22 1504 06/13/22 2335 06/14/22 0925  BP: (!) 132/54 (!) 141/70 128/62 127/73  Pulse:  82 69 83  Resp:  18 18 16   Temp:  99 F (37.2 C) 98.8 F (37.1 C) 98 F (36.7 C)  TempSrc:   Oral Oral  SpO2:  99% 98% 100%  Weight:      Height:       Physical Exam HENT:     Head: Normocephalic.     Mouth/Throat:     Pharynx: No oropharyngeal exudate.  Eyes:     General: Lids are normal.     Conjunctiva/sclera: Conjunctivae normal.  Cardiovascular:     Rate and Rhythm: Normal rate and regular rhythm.     Heart sounds: Normal heart sounds, S1 normal and S2 normal.  Pulmonary:     Breath sounds: No decreased breath sounds, wheezing, rhonchi or rales.  Abdominal:     Palpations: Abdomen is soft.     Tenderness: There is no abdominal tenderness.  Musculoskeletal:     Right lower leg: No swelling.     Left lower leg: No swelling.  Skin:    General: Skin is warm.     Findings: No rash.  Neurological:     Mental Status: She is alert.     Comments: Answers simple questions  without issue.     Data Reviewed: No new data  Family Communication: I asked TOC to look into the patient's guardian  Disposition: Status is: Inpatient Remains inpatient appropriate because: We do not have a safe disposition  Planned Discharge Destination: To be determined    Time spent: 27 minutes  Author: Alford Highland, MD 06/14/2022 2:32 PM  For on call review [email or web address].

## 2022-06-14 NOTE — Plan of Care (Signed)

## 2022-06-15 DIAGNOSIS — I10 Essential (primary) hypertension: Secondary | ICD-10-CM | POA: Diagnosis not present

## 2022-06-15 DIAGNOSIS — K863 Pseudocyst of pancreas: Secondary | ICD-10-CM | POA: Diagnosis not present

## 2022-06-15 DIAGNOSIS — R7881 Bacteremia: Secondary | ICD-10-CM | POA: Diagnosis not present

## 2022-06-15 DIAGNOSIS — F01B18 Vascular dementia, moderate, with other behavioral disturbance: Secondary | ICD-10-CM | POA: Diagnosis not present

## 2022-06-15 NOTE — Progress Notes (Signed)
Progress Note   Patient: Madeline Taylor ZOX:[id number] DOB: 03-Sep-1935 DOA: 04/22/2022     53 DOS: the patient was seen and examined on 06/15/2022   Brief hospital course: 87 y.o. female with medical history significant of dementia, hypertension, history of TIAs presenting with encephalopathy, hypothermia  Per daughter, patient had worsening dementia, frequently Lost walking out of the house.  But normally has good appetite.  For the last week, patient had significant increased agitation, has not been sleeping well. She also had a positive COVID about a week ago, repeat COVID was negative at admission. Patient is seen by PT/OT, no need for nursing home placement.  However unsafe discharge.  The patient has vascular dementia and does not have the ability to make medical decisions or sign paperwork for herself at this time.  Evening of 4/16, patient had a fall out of bed and has bruising left frontal and around the left eye.  4/27 in the evening spiked a fever of 102.  Blood cultures positive for E. coli and Started empirically on antibiotics and IV fluids.   4/29.  CT scan shows a pancreatic pseudocyst.  Tumor markers negative.  Patient medically stable to go out to a facility but we do not have a place.   Assessment and Plan: * Vascular dementia (HCC) Dementia with behavioral disturbance.  On Seroquel at night.  Patient calm and cooperative and answers questions.  Patient has not been wandering.   Bacteremia due to Escherichia coli Resolved secondary to complete treatment.  Fever of 102 on 4/27.  Blood cultures positive for E. Coli secondary to urine.  Completed antibiotic course.  Pancreatic pseudocyst Seen on CT scan of the abdomen.  Tumor markers negative.  Essential hypertension On amlodipine and losartan   History of TIA (transient ischemic attack) MRI brain was negative for acute intracranial abnormality.  Hypothermia Resolved.  Hyponatremia Last sodium normal  range.  Facial bruising, sequela improved  Metabolic acidosis Improved        Subjective: Patient feels fine and offers no complaints.  Asking to go home.  Admitted 53 days ago with altered mental status.  Physical Exam: Vitals:   06/14/22 1456 06/15/22 0035 06/15/22 0851 06/15/22 1310  BP: (!) 135/56 (!) 112/52 131/64 (!) 156/55  Pulse: 67 65 70 71  Resp: 18 15 18 16   Temp: 97.8 F (36.6 C) 97.9 F (36.6 C) 98.5 F (36.9 C) 98.2 F (36.8 C)  TempSrc: Oral Oral  Oral  SpO2: 100% 99% 98% 100%  Weight:      Height:       Physical Exam HENT:     Head: Normocephalic.     Mouth/Throat:     Pharynx: No oropharyngeal exudate.  Eyes:     General: Lids are normal.     Conjunctiva/sclera: Conjunctivae normal.  Cardiovascular:     Rate and Rhythm: Normal rate and regular rhythm.     Heart sounds: Normal heart sounds, S1 normal and S2 normal.  Pulmonary:     Breath sounds: No decreased breath sounds, wheezing, rhonchi or rales.  Abdominal:     Palpations: Abdomen is soft.     Tenderness: There is no abdominal tenderness.  Musculoskeletal:     Right lower leg: No swelling.     Left lower leg: No swelling.  Skin:    General: Skin is warm.     Findings: No rash.  Neurological:     Mental Status: She is alert.     Comments: Answers  simple questions without issue.     Data Reviewed: No new data  Family Communication: Updated patient's daughter on the phone  Disposition: Status is: Inpatient Remains inpatient appropriate because: We do not have a safe disposition.  TOC to see who was assigned this patient's guardian.  Planned Discharge Destination: To be determined    Time spent: 28 minutes  Author: Alford Highland, MD 06/15/2022 2:29 PM  For on call review [email or web address].

## 2022-06-15 NOTE — TOC Progression Note (Addendum)
Transition of Care [facility]) - Progression Note    Patient Details  Name: Madeline Taylor MRN: [id number] Date of Birth: 07/15/1935  [facility] CM/SW Contact  Darleene Cleaver, [place] Phone Number: 06/15/2022, 11:04 AM  Clinical Narrative:     Received confirmation from Nancy Fetter that DSS is legal guardian for patient.  Will contact memory care facilities again that will accept Medicaid.  TOC to continue to follow patient's progress throughout discharge planning.   Expected Discharge Plan: Long Term Nursing Home Barriers to Discharge: No SNF bed  Expected Discharge Plan and Services       Living arrangements for the past 2 months: Single Family Home                 DME Arranged: Walker rolling DME Agency: [employer] Date DME Agency Contacted: 04/25/22 Time DME Agency Contacted: [phone] Representative spoke with at DME Agency: [name]             Social Determinants of Health (SDOH) Interventions SDOH Screenings   Food Insecurity: Patient Unable To Answer (04/23/2022)  Housing: Low Risk  (04/23/2022)  Transportation Needs: No Transportation Needs (04/23/2022)  Utilities: Not At Risk (04/23/2022)  Alcohol Screen: Low Risk  (05/21/2021)  Depression (PHQ2-9): Low Risk  (05/21/2021)  Financial Resource Strain: Low Risk  (05/21/2021)  Physical Activity: Inactive (05/21/2021)  Social Connections: Moderately Integrated (05/21/2021)  Stress: No Stress Concern Present (05/21/2021)  Tobacco Use: Low Risk  (04/23/2022)    Readmission Risk Interventions     No data to display

## 2022-06-16 DIAGNOSIS — F01A0 Vascular dementia, mild, without behavioral disturbance, psychotic disturbance, mood disturbance, and anxiety: Secondary | ICD-10-CM | POA: Diagnosis not present

## 2022-06-16 NOTE — Progress Notes (Signed)
Progress Note   Patient: Madeline Taylor WJX:[id number] DOB: 1935-09-11 DOA: 04/22/2022     54 DOS: the patient was seen and examined on 06/16/2022   Brief hospital course: 87 y.o. female with medical history significant of dementia, hypertension, history of TIAs presenting with encephalopathy, hypothermia  Per daughter, patient had worsening dementia, frequently Lost walking out of the house.  But normally has good appetite.  For the last week, patient had significant increased agitation, has not been sleeping well. She also had a positive COVID about a week ago, repeat COVID was negative at admission. Patient is seen by PT/OT, no need for nursing home placement.  However unsafe discharge.  The patient has vascular dementia and does not have the ability to make medical decisions or sign paperwork for herself at this time.  Evening of 4/16, patient had a fall out of bed and has bruising left frontal and around the left eye.  4/27 in the evening spiked a fever of 102.  Blood cultures positive for E. coli and Started empirically on antibiotics and IV fluids.   4/29.  CT scan shows a pancreatic pseudocyst.  Tumor markers negative.  Patient medically stable to go out to a facility but we do not have a place.  5/29: Remained medically stable-awaiting disposition.  DSS is legal guardian now   Assessment and Plan: * Vascular dementia (HCC) Dementia with behavioral disturbance.  On Seroquel at night.  Patient calm and cooperative and answers questions.  Patient has not been wandering.   Bacteremia due to Escherichia coli Resolved secondary to complete treatment.  Fever of 102 on 4/27.  Blood cultures positive for E. Coli secondary to urine.  Completed antibiotic course.  Pancreatic pseudocyst Seen on CT scan of the abdomen.  Tumor markers negative.  Essential hypertension On amlodipine and losartan   History of TIA (transient ischemic attack) MRI brain was negative for acute intracranial  abnormality.  Hypothermia Resolved.  Hyponatremia Last sodium normal range.  Facial bruising, sequela improved  Metabolic acidosis Improved   Subjective: Patient was seen and examined today.  No new concerns or complaints.  Physical Exam: Vitals:   06/16/22 0407 06/16/22 0836 06/16/22 1500 06/16/22 1604  BP: (!) 129/54 (!) 156/65  (!) 155/66  Pulse: 64 65  77  Resp: 20 16  18   Temp: 98.6 F (37 C) 98.2 F (36.8 C)  98 F (36.7 C)  TempSrc:    Oral  SpO2: 100% 100% 100% 100%  Weight:      Height:       General.  Frail elderly lady, in no acute distress. Pulmonary.  Lungs clear bilaterally, normal respiratory effort. CV.  Regular rate and rhythm, no JVD, rub or murmur. Abdomen.  Soft, nontender, nondistended, BS positive. CNS.  Alert and oriented to self and place.  No focal neurologic deficit. Extremities.  No edema, no cyanosis, pulses intact and symmetrical. Psychiatry.  Appears to have some cognitive impairment.    Data Reviewed: No new data  Family Communication:   Disposition: Status is: Inpatient Remains inpatient appropriate because: We do not have a safe disposition.  TOC to see who was assigned this patient's guardian.  Planned Discharge Destination: Memory care unit   Time spent: 35 minutes  This record has been created using Conservation officer, historic buildings. Errors have been sought and corrected,but may not always be located. Such creation errors do not reflect on the standard of care.   Author: Arnetha Courser, MD 06/16/2022 4:07 PM  For on call review [email or web address].

## 2022-06-16 NOTE — [place] FL2 (Signed)
[place] MEDICAID FL2 LEVEL OF CARE FORM     IDENTIFICATION  Patient Name: Madeline Taylor Birthdate: 02-Feb-1935 Sex: female Admission Date (Current Location): 04/22/2022  [place] and [place] Number:  [address] [id number] Q Pending Facility and Address:  [facility], [address] [place], [place] [address]      Provider Number: [id number]  Attending Physician Name and Address:  Arnetha Courser, MD  Relative Name and Phone Number:  [facility] DSS legal guardian Nancy Fetter, [phone]  Annye English Daughter   [phone]  Herskowitz,Alton Spouse   [phone]  Teruya,Patricia Daughter   [phone]    Current Level of Care: Hospital Recommended Level of Care: Memory Care, Other (Comment), Skilled Nursing Facility Prior Approval Number:    Date Approved/Denied:   PASRR Number: [id number]  Discharge Plan: SNF (Memory Care)    Current Diagnoses: Patient Active Problem List   Diagnosis Date Noted   Pancreatic pseudocyst 05/18/2022   Bacteremia due to Escherichia coli 05/16/2022   Hyponatremia 05/16/2022   Facial bruising, sequela 05/12/2022   Metabolic acidosis 04/24/2022   Dementia with behavioral disturbance (HCC) 04/24/2022   Hypothermia 04/23/2022   Mixed incontinence 02/12/2021   Aortic atherosclerosis (HCC) 11/17/2020   Vascular dementia (HCC) 11/17/2020   History of TIA (transient ischemic attack) 11/17/2020   Osteoarthritis of multiple joints 05/18/2018   Hyperlipidemia 07/29/2016   Essential hypertension 07/29/2016   Osteoporosis of femur without pathological fracture 07/29/2016    Orientation RESPIRATION BLADDER Height & Weight     Self  Normal Incontinent Weight: 108 lb 14.5 oz (49.4 kg) Height:  4\' 11"  (149.9 cm)  BEHAVIORAL SYMPTOMS/MOOD NEUROLOGICAL BOWEL NUTRITION STATUS  Wanderer   Continent Diet  AMBULATORY STATUS COMMUNICATION OF NEEDS Skin   Supervision Verbally Normal                       Personal Care  Assistance Level of Assistance  Bathing, Feeding, Dressing Bathing Assistance: Limited assistance Feeding assistance: Limited assistance Dressing Assistance: Limited assistance     Functional Limitations Info  Sight, Hearing, Speech Sight Info: Adequate Hearing Info: Adequate Speech Info: Adequate    SPECIAL CARE FACTORS FREQUENCY                       Contractures Contractures Info: Not present    Additional Factors Info  Code Status, Allergies, Psychotropic Code Status Info: Full Code Allergies Info: Lisinopril  Statins Psychotropic Info: QUEtiapine (SEROQUEL) tablet 25 mg         Current Medications (06/16/2022):  This is the current hospital active medication list Current Facility-Administered Medications  Medication Dose Route Frequency Provider Last Rate Last Admin   acetaminophen (TYLENOL) tablet 650 mg  650 mg Oral Q6H PRN Alford Highland, MD   650 mg at 06/15/22 2121   alum & mag hydroxide-simeth (MAALOX/MYLANTA) 200-200-20 MG/5ML suspension 30 mL  30 mL Oral Q6H PRN Sharman Cheek, MD       amLODipine (NORVASC) tablet 5 mg  5 mg Oral Daily Marrion Coy, MD   5 mg at 06/16/22 0934   losartan (COZAAR) tablet 50 mg  50 mg Oral Daily Marrion Coy, MD   50 mg at 06/16/22 0934   Muscle Rub CREA   Topical PRN Floydene Flock, MD   Given at 06/05/22 0429   ondansetron (ZOFRAN) tablet 4 mg  4 mg Oral Q6H PRN Floydene Flock, MD       Or   ondansetron [facility])  injection 4 mg  4 mg Intravenous Q6H PRN Floydene Flock, MD       Oral care mouth rinse  15 mL Mouth Rinse PRN Renae Gloss, Richard, MD       QUEtiapine (SEROQUEL) tablet 25 mg  25 mg Oral QHS Marrion Coy, MD   25 mg at 06/15/22 2121   risperiDONE (RISPERDAL) tablet 1 mg  1 mg Oral Q12H PRN Alford Highland, MD   1 mg at 06/11/22 1519   senna-docusate (Senokot-S) tablet 2 tablet  2 tablet Oral BID Marrion Coy, MD   2 tablet at 06/16/22 0981     Discharge Medications: Please see discharge summary for a  list of discharge medications.  Relevant Imaging Results:  Relevant Lab Results:   Additional Information SSN [id number]  Darleene Cleaver, LCSW

## 2022-06-16 NOTE — TOC Progression Note (Signed)
Transition of Care [facility]) - Progression Note    Patient Details  Name: Madeline Taylor MRN: [id number] Date of Birth: 30-Jun-1935  Transition [facility]) CM/SW Contact  Darleene Cleaver, [place] Phone Number: 06/16/2022, 11:52 PM  Clinical Narrative:     CSW spoke to [place] at [employer].  She can tentatively over a bed if it can be confirmed with DSS that patient will be getting Medicaid and that DSS can provide payment for room and board with her social security.  TOC to follow up with DSS on Thursday.  Expected Discharge Plan: Long Term Nursing Home Barriers to Discharge: No SNF bed  Expected Discharge Plan and Services    Memory Care SNF.   Living arrangements for the past 2 months: Single Family Home                 DME Arranged: Walker rolling DME Agency: [employer] Date DME Agency Contacted: 04/25/22 Time DME Agency Contacted: [phone] Representative spoke with at DME Agency: [name]             Social Determinants of Health (SDOH) Interventions SDOH Screenings   Food Insecurity: Patient Unable To Answer (04/23/2022)  Housing: Low Risk  (04/23/2022)  Transportation Needs: No Transportation Needs (04/23/2022)  Utilities: Not At Risk (04/23/2022)  Alcohol Screen: Low Risk  (05/21/2021)  Depression (PHQ2-9): Low Risk  (05/21/2021)  Financial Resource Strain: Low Risk  (05/21/2021)  Physical Activity: Inactive (05/21/2021)  Social Connections: Moderately Integrated (05/21/2021)  Stress: No Stress Concern Present (05/21/2021)  Tobacco Use: Low Risk  (04/23/2022)    Readmission Risk Interventions     No data to display

## 2022-06-16 NOTE — Plan of Care (Signed)

## 2022-06-17 DIAGNOSIS — F03C0 Unspecified dementia, severe, without behavioral disturbance, psychotic disturbance, mood disturbance, and anxiety: Secondary | ICD-10-CM | POA: Diagnosis not present

## 2022-06-17 DIAGNOSIS — F01A0 Vascular dementia, mild, without behavioral disturbance, psychotic disturbance, mood disturbance, and anxiety: Secondary | ICD-10-CM | POA: Diagnosis not present

## 2022-06-17 NOTE — Progress Notes (Signed)
Progress Note   Patient: Madeline Taylor ZOX:[id number] DOB: 1935-04-07 DOA: 04/22/2022     55 DOS: the patient was seen and examined on 06/17/2022   Brief hospital course: 87 y.o. female with medical history significant of dementia, hypertension, history of TIAs presenting with encephalopathy, hypothermia  Per daughter, patient had worsening dementia, frequently Lost walking out of the house.  But normally has good appetite.  For the last week, patient had significant increased agitation, has not been sleeping well. She also had a positive COVID about a week ago, repeat COVID was negative at admission. Patient is seen by PT/OT, no need for nursing home placement.  However unsafe discharge.  The patient has vascular dementia and does not have the ability to make medical decisions or sign paperwork for herself at this time.  Evening of 4/16, patient had a fall out of bed and has bruising left frontal and around the left eye.  4/27 in the evening spiked a fever of 102.  Blood cultures positive for E. coli and Started empirically on antibiotics and IV fluids.   4/29.  CT scan shows a pancreatic pseudocyst.  Tumor markers negative.  Patient medically stable to go out to a facility but we do not have a place.  5/29: Remained medically stable-awaiting disposition.  DSS is legal guardian now   Assessment and Plan: * Vascular dementia (HCC) Dementia with behavioral disturbance.  On Seroquel at night.  Patient calm and cooperative and answers questions.  Patient has not been wandering.   Bacteremia due to Escherichia coli Resolved secondary to complete treatment.  Fever of 102 on 4/27.  Blood cultures positive for E. Coli secondary to urine.  Completed antibiotic course.  Pancreatic pseudocyst Seen on CT scan of the abdomen.  Tumor markers negative.  Essential hypertension On amlodipine and losartan   History of TIA (transient ischemic attack) MRI brain was negative for acute intracranial  abnormality.  Hypothermia Resolved.  Hyponatremia Last sodium normal range.  Facial bruising, sequela improved  Metabolic acidosis Improved   Subjective: Patient was sitting in bed comfortably when seen today.  No new concern.  Physical Exam: Vitals:   06/16/22 1604 06/16/22 2116 06/17/22 0719 06/17/22 1205  BP: (!) 155/66 (!) 156/67 136/63 122/60  Pulse: 77 63 (!) 55 69  Resp: 18 20 17 17   Temp:  97.8 F (36.6 C) 97.7 F (36.5 C) 97.7 F (36.5 C)  TempSrc: Oral     SpO2: 100% 100% 98% 100%  Weight:      Height:       General.  Frail elderly lady, in no acute distress. Pulmonary.  Lungs clear bilaterally, normal respiratory effort. CV.  Regular rate and rhythm, no JVD, rub or murmur. Abdomen.  Soft, nontender, nondistended, BS positive. CNS.  Alert and oriented to self and place.  No focal neurologic deficit. Extremities.  No edema, no cyanosis, pulses intact and symmetrical. Psychiatry.  Appears to have some cognitive impairment.    Data Reviewed: No new data  Family Communication:   Disposition: Status is: Inpatient Remains inpatient appropriate because: We do not have a safe disposition.  TOC to see who was assigned this patient's guardian.  Planned Discharge Destination: Memory care unit   Time spent: 30 minutes  This record has been created using Conservation officer, historic buildings. Errors have been sought and corrected,but may not always be located. Such creation errors do not reflect on the standard of care.   Author: Arnetha Courser, MD 06/17/2022 4:17 PM  For on call review [email or web address].

## 2022-06-18 DIAGNOSIS — F01A0 Vascular dementia, mild, without behavioral disturbance, psychotic disturbance, mood disturbance, and anxiety: Secondary | ICD-10-CM | POA: Diagnosis not present

## 2022-06-18 MED ORDER — HALOPERIDOL LACTATE 5 MG/ML IJ SOLN
1.0000 mg | Freq: Four times a day (QID) | INTRAMUSCULAR | Status: DC | PRN
  Filled 2022-06-18: qty 1

## 2022-06-18 NOTE — Plan of Care (Signed)

## 2022-06-18 NOTE — Progress Notes (Signed)
Progress Note   Patient: Madeline Taylor ZOX:[id number] DOB: 1935-09-16 DOA: 04/22/2022     56 DOS: the patient was seen and examined on 06/18/2022   Brief hospital course: 87 y.o. female with medical history significant of dementia, hypertension, history of TIAs presenting with encephalopathy, hypothermia  Per daughter, patient had worsening dementia, frequently Lost walking out of the house.  But normally has good appetite.  For the last week, patient had significant increased agitation, has not been sleeping well. She also had a positive COVID about a week ago, repeat COVID was negative at admission. Patient is seen by PT/OT, no need for nursing home placement.  However unsafe discharge.  The patient has vascular dementia and does not have the ability to make medical decisions or sign paperwork for herself at this time.  Evening of 4/16, patient had a fall out of bed and has bruising left frontal and around the left eye.  4/27 in the evening spiked a fever of 102.  Blood cultures positive for E. coli and Started empirically on antibiotics and IV fluids.   4/29.  CT scan shows a pancreatic pseudocyst.  Tumor markers negative.  Patient medically stable to go out to a facility but we do not have a place.  5/29: Remained medically stable-awaiting disposition.  DSS is legal guardian now.  5/31: Patient was quite combative yesterday late afternoon, added as needed Haldol   Assessment and Plan: * Vascular dementia (HCC) Dementia with behavioral disturbance.  On Seroquel at night.  Patient calm and cooperative and answers questions.  Patient has not been wandering.   Bacteremia due to Escherichia coli Resolved secondary to complete treatment.  Fever of 102 on 4/27.  Blood cultures positive for E. Coli secondary to urine.  Completed antibiotic course.  Pancreatic pseudocyst Seen on CT scan of the abdomen.  Tumor markers negative.  Essential hypertension On amlodipine and losartan   History  of TIA (transient ischemic attack) MRI brain was negative for acute intracranial abnormality.  Hypothermia Resolved.  Hyponatremia Last sodium normal range.  Facial bruising, sequela improved  Metabolic acidosis Improved   Subjective: Patient was calm and pleasant when seen today.  No acute concern.  Physical Exam: Vitals:   06/16/22 2116 06/17/22 0719 06/17/22 1205 06/18/22 0905  BP: (!) 156/67 136/63 122/60 130/65  Pulse: 63 (!) 55 69 74  Resp: 20 17 17 17   Temp: 97.8 F (36.6 C) 97.7 F (36.5 C) 97.7 F (36.5 C) 97.7 F (36.5 C)  TempSrc:      SpO2: 100% 98% 100% 98%  Weight:      Height:       General.  Frail elderly lady, in no acute distress. Pulmonary.  Lungs clear bilaterally, normal respiratory effort. CV.  Regular rate and rhythm, no JVD, rub or murmur. Abdomen.  Soft, nontender, nondistended, BS positive. CNS.  Alert and oriented .  No focal neurologic deficit. Extremities.  No edema, no cyanosis, pulses intact and symmetrical. Psychiatry.  Appears to have some cognitive impairment   Data Reviewed: No new data  Family Communication: DSS is guardian  Disposition: Status is: Inpatient Remains inpatient appropriate because: We do not have a safe disposition.  TOC to see who was assigned this patient's guardian.  Planned Discharge Destination: Memory care unit   Time spent: 35 minutes  This record has been created using Conservation officer, historic buildings. Errors have been sought and corrected,but may not always be located. Such creation errors do not reflect on the  standard of care.   Author: Arnetha Courser, MD 06/18/2022 3:15 PM  For on call review [email or web address].

## 2022-06-18 NOTE — TOC Progression Note (Signed)
Transition of Care [facility]) - Progression Note    Patient Details  Name: Madeline Taylor MRN: [id number] Date of Birth: [birth year]  Transition of Care [facility]) CM/SW Contact  Darleene Cleaver, [place] Phone Number: 06/18/2022, 9:04 PM  Clinical Narrative:     [facility] does not have a bed available anymore in their memory care unit.  DSS is legal guardian, CSW to continue looking for placement options that will accept Medicaid Pending.  Expected Discharge Plan: Long Term Nursing Home Barriers to Discharge: No SNF bed  Expected Discharge Plan and Services       Living arrangements for the past 2 months: Single Family Home                 DME Arranged: Walker rolling DME Agency: [employer] Date DME Agency Contacted: 04/25/22 Time DME Agency Contacted: [phone] Representative spoke with at DME Agency: [name]             Social Determinants of Health (SDOH) Interventions SDOH Screenings   Food Insecurity: Patient Unable To Answer (04/23/2022)  Housing: Low Risk  (04/23/2022)  Transportation Needs: No Transportation Needs (04/23/2022)  Utilities: Not At Risk (04/23/2022)  Alcohol Screen: Low Risk  (05/21/2021)  Depression (PHQ2-9): Low Risk  (05/21/2021)  Financial Resource Strain: Low Risk  (05/21/2021)  Physical Activity: Inactive (05/21/2021)  Social Connections: Moderately Integrated (05/21/2021)  Stress: No Stress Concern Present (05/21/2021)  Tobacco Use: Low Risk  (04/23/2022)    Readmission Risk Interventions     No data to display

## 2022-06-19 DIAGNOSIS — F03C0 Unspecified dementia, severe, without behavioral disturbance, psychotic disturbance, mood disturbance, and anxiety: Secondary | ICD-10-CM | POA: Diagnosis not present

## 2022-06-19 DIAGNOSIS — F01A0 Vascular dementia, mild, without behavioral disturbance, psychotic disturbance, mood disturbance, and anxiety: Secondary | ICD-10-CM | POA: Diagnosis not present

## 2022-06-19 NOTE — Progress Notes (Signed)
Progress Note   Patient: Madeline Taylor ZOX:[id number] DOB: 08-18-1935 DOA: 04/22/2022     57 DOS: the patient was seen and examined on 06/19/2022   Brief hospital course: 87 y.o. female with medical history significant of dementia, hypertension, history of TIAs presenting with encephalopathy, hypothermia  Per daughter, patient had worsening dementia, frequently Lost walking out of the house.  But normally has good appetite.  For the last week, patient had significant increased agitation, has not been sleeping well. She also had a positive COVID about a week ago, repeat COVID was negative at admission. Patient is seen by PT/OT, no need for nursing home placement.  However unsafe discharge.  The patient has vascular dementia and does not have the ability to make medical decisions or sign paperwork for herself at this time.  Evening of 4/16, patient had a fall out of bed and has bruising left frontal and around the left eye.  4/27 in the evening spiked a fever of 102.  Blood cultures positive for E. coli and Started empirically on antibiotics and IV fluids.   4/29.  CT scan shows a pancreatic pseudocyst.  Tumor markers negative.  Patient medically stable to go out to a facility but we do not have a place.  5/29: Remained medically stable-awaiting disposition.  DSS is legal guardian now.  5/31: Patient was quite combative yesterday late afternoon, added as needed Haldol   Assessment and Plan: * Vascular dementia (HCC) Dementia with behavioral disturbance.  On Seroquel at night.  Patient calm and cooperative and answers questions.  Patient has not been wandering.   Bacteremia due to Escherichia coli Resolved secondary to complete treatment.  Fever of 102 on 4/27.  Blood cultures positive for E. Coli secondary to urine.  Completed antibiotic course.  Pancreatic pseudocyst Seen on CT scan of the abdomen.  Tumor markers negative.  Essential hypertension On amlodipine and losartan   History  of TIA (transient ischemic attack) MRI brain was negative for acute intracranial abnormality.  Hypothermia Resolved.  Hyponatremia Last sodium normal range.  Facial bruising, sequela improved  Metabolic acidosis Improved   Subjective: Patient remained calm and no new concern  Physical Exam: Vitals:   06/18/22 0905 06/18/22 2147 06/19/22 0827 06/19/22 1023  BP: 130/65 125/74 (!) 148/72 123/68  Pulse: 74 63 66   Resp: 17 18 18    Temp: 97.7 F (36.5 C) 97.8 F (36.6 C) 98.2 F (36.8 C)   TempSrc:  Oral    SpO2: 98% 97% 100%   Weight:      Height:       General.  Frail elderly lady, in no acute distress. Pulmonary.  Lungs clear bilaterally, normal respiratory effort. CV.  Regular rate and rhythm, no JVD, rub or murmur. Abdomen.  Soft, nontender, nondistended, BS positive. CNS.  Alert and oriented .  No focal neurologic deficit. Extremities.  No edema, no cyanosis, pulses intact and symmetrical. Psychiatry.  Appears to have some cognitive impairment   Data Reviewed: No new data  Family Communication: DSS is guardian  Disposition: Status is: Inpatient Remains inpatient appropriate because: We do not have a safe disposition.  TOC to see who was assigned this patient's guardian.  Planned Discharge Destination: Memory care unit   Time spent: 30 minutes  This record has been created using Conservation officer, historic buildings. Errors have been sought and corrected,but may not always be located. Such creation errors do not reflect on the standard of care.   Author: Arnetha Courser, MD  06/19/2022 3:08 PM  For on call review [email or web address].

## 2022-06-20 DIAGNOSIS — F01A0 Vascular dementia, mild, without behavioral disturbance, psychotic disturbance, mood disturbance, and anxiety: Secondary | ICD-10-CM | POA: Diagnosis not present

## 2022-06-20 DIAGNOSIS — F03C0 Unspecified dementia, severe, without behavioral disturbance, psychotic disturbance, mood disturbance, and anxiety: Secondary | ICD-10-CM | POA: Diagnosis not present

## 2022-06-20 NOTE — Plan of Care (Signed)

## 2022-06-20 NOTE — Progress Notes (Signed)
Progress Note   Patient: Madeline Taylor WGN:[id number] DOB: 12-25-1935 DOA: 04/22/2022     58 DOS: the patient was seen and examined on 06/20/2022   Brief hospital course: 87 y.o. female with medical history significant of dementia, hypertension, history of TIAs presenting with encephalopathy, hypothermia  Per daughter, patient had worsening dementia, frequently Lost walking out of the house.  But normally has good appetite.  For the last week, patient had significant increased agitation, has not been sleeping well. She also had a positive COVID about a week ago, repeat COVID was negative at admission. Patient is seen by PT/OT, no need for nursing home placement.  However unsafe discharge.  The patient has vascular dementia and does not have the ability to make medical decisions or sign paperwork for herself at this time.  Evening of 4/16, patient had a fall out of bed and has bruising left frontal and around the left eye.  4/27 in the evening spiked a fever of 102.  Blood cultures positive for E. coli and Started empirically on antibiotics and IV fluids.   4/29.  CT scan shows a pancreatic pseudocyst.  Tumor markers negative.  Patient medically stable to go out to a facility but we do not have a place.  5/29: Remained medically stable-awaiting disposition.  DSS is legal guardian now.  5/31: Patient was quite combative yesterday late afternoon, added as needed Haldol   Assessment and Plan: * Vascular dementia (HCC) Dementia with behavioral disturbance.  On Seroquel at night.  Patient calm and cooperative and answers questions.  Patient has not been wandering.   Bacteremia due to Escherichia coli Resolved secondary to complete treatment.  Fever of 102 on 4/27.  Blood cultures positive for E. Coli secondary to urine.  Completed antibiotic course.  Pancreatic pseudocyst Seen on CT scan of the abdomen.  Tumor markers negative.  Essential hypertension On amlodipine and losartan   History  of TIA (transient ischemic attack) MRI brain was negative for acute intracranial abnormality.  Hypothermia Resolved.  Hyponatremia Last sodium normal range.  Facial bruising, sequela improved  Metabolic acidosis Improved   Subjective: Patient with no new concern.  Pretty pleasant and calm.  Physical Exam: Vitals:   06/19/22 1630 06/19/22 2046 06/20/22 0839 06/20/22 1240  BP: 138/75 (!) 154/80 138/62 (!) 158/69  Pulse: 88 79 70   Resp: 18 18 15    Temp: 97.8 F (36.6 C) 98.2 F (36.8 C) (!) 97.5 F (36.4 C)   TempSrc:      SpO2: 97% 100% 100%   Weight:      Height:       General.  Frail elderly lady, in no acute distress. Pulmonary.  Lungs clear bilaterally, normal respiratory effort. CV.  Regular rate and rhythm, no JVD, rub or murmur. Abdomen.  Soft, nontender, nondistended, BS positive. CNS.  Alert and oriented .  No focal neurologic deficit. Extremities.  No edema, no cyanosis, pulses intact and symmetrical. Psychiatry.  Appears to have some cognitive impairment   Data Reviewed: No new data  Family Communication: DSS is guardian  Disposition: Status is: Inpatient Remains inpatient appropriate because: We do not have a safe disposition.  TOC to see who was assigned this patient's guardian.  Planned Discharge Destination: Memory care unit   Time spent: 30 minutes  This record has been created using Conservation officer, historic buildings. Errors have been sought and corrected,but may not always be located. Such creation errors do not reflect on the standard of care.  Author: Arnetha Courser, MD 06/20/2022 2:41 PM  For on call review [email or web address].

## 2022-06-20 NOTE — Progress Notes (Signed)
Mobility Specialist - Progress Note   06/20/22 1149  Mobility  Activity Ambulated independently in hallway  Level of Assistance Independent  Assistive Device Front wheel walker  Distance Ambulated (ft) 640 ft  Activity Response Tolerated well  Mobility Referral Yes  $Mobility charge 1 Mobility  Mobility Specialist Start Time (ACUTE ONLY) 1030  Mobility Specialist Stop Time (ACUTE ONLY) 1041  Mobility Specialist Time Calculation (min) (ACUTE ONLY) 11 min   Pt OOB on RA upon arrival. Pt ambulates 4 laps around NS indep. Pt returns to bed with needs in reach and sitter present.  Terrilyn Saver  Mobility Specialist  06/20/22 11:51 AM

## 2022-06-21 DIAGNOSIS — F01A0 Vascular dementia, mild, without behavioral disturbance, psychotic disturbance, mood disturbance, and anxiety: Secondary | ICD-10-CM | POA: Diagnosis not present

## 2022-06-21 LAB — BASIC METABOLIC PANEL
Anion gap: 9 (ref 5–15)
BUN: 22 mg/dL (ref 8–23)
CO2: 25 mmol/L (ref 22–32)
Calcium: 8.9 mg/dL (ref 8.9–10.3)
Chloride: 104 mmol/L (ref 98–111)
Creatinine, Ser: 0.98 mg/dL (ref 0.44–1.00)
GFR, Estimated: 57 mL/min — ABNORMAL LOW (ref >60)
Glucose, Bld: 122 mg/dL — ABNORMAL HIGH (ref 70–99)
Potassium: 3.6 mmol/L (ref 3.5–5.1)
Sodium: 138 mmol/L (ref 135–145)

## 2022-06-21 LAB — URINALYSIS, W/ REFLEX TO CULTURE (INFECTION SUSPECTED)
Bacteria, UA: NONE SEEN
Bilirubin Urine: NEGATIVE
Glucose, UA: NEGATIVE mg/dL
Hgb urine dipstick: NEGATIVE
Ketones, ur: NEGATIVE mg/dL
Leukocytes,Ua: NEGATIVE
Nitrite: NEGATIVE
Protein, ur: NEGATIVE mg/dL
Specific Gravity, Urine: 1.005 (ref 1.005–1.030)
pH: 6 (ref 5.0–8.0)

## 2022-06-21 LAB — CBC
HCT: 39.5 % (ref 36.0–46.0)
Hemoglobin: 12.7 g/dL (ref 12.0–15.0)
MCH: 30.2 pg (ref 26.0–34.0)
MCHC: 32.2 g/dL (ref 30.0–36.0)
MCV: 93.8 fL (ref 80.0–100.0)
Platelets: 232 10*3/uL (ref 150–400)
RBC: 4.21 MIL/uL (ref 3.87–5.11)
RDW: 13.2 % (ref 11.5–15.5)
WBC: 5.5 10*3/uL (ref 4.0–10.5)
nRBC: 0 % (ref 0.0–0.2)

## 2022-06-21 NOTE — Progress Notes (Signed)
Progress Note   Patient: Madeline Taylor:[id number] DOB: 1935/12/15 DOA: 04/22/2022     59 DOS: the patient was seen and examined on 06/21/2022   Brief hospital course: 87 y.o. female with medical history significant of dementia, hypertension, history of TIAs presenting with encephalopathy, hypothermia  Per daughter, patient had worsening dementia, frequently Lost walking out of the house.  But normally has good appetite.  For the last week, patient had significant increased agitation, has not been sleeping well. She also had a positive COVID about a week ago, repeat COVID was negative at admission. Patient is seen by PT/OT, no need for nursing home placement.  However unsafe discharge.  The patient has vascular dementia and does not have the ability to make medical decisions or sign paperwork for herself at this time.  Evening of 4/16, patient had a fall out of bed and has bruising left frontal and around the left eye.  4/27 in the evening spiked a fever of 102.  Blood cultures positive for E. coli and Started empirically on antibiotics and IV fluids.   4/29.  CT scan shows a pancreatic pseudocyst.  Tumor markers negative.  Patient medically stable to go out to a facility but we do not have a place.  5/29: Remained medically stable-awaiting disposition.  DSS is legal guardian now.  5/31: Patient was quite combative yesterday late afternoon, added as needed Haldol.  6/3: Hemodynamically stable.  Nursing concern of worsening confusion and increased urinary urgency and frequency with some incontinence.  Ordered CBC, BMP and UA with reflex to culture.  Holding any antibiotics for now   Assessment and Plan: * Vascular dementia (HCC) Dementia with behavioral disturbance.  On Seroquel at night.  Patient calm and cooperative and answers questions.  Patient has not been wandering.   Bacteremia due to Escherichia coli Resolved secondary to complete treatment.  Fever of 102 on 4/27.  Blood  cultures positive for E. Coli secondary to urine.  Completed antibiotic course.  Pancreatic pseudocyst Seen on CT scan of the abdomen.  Tumor markers negative.  Essential hypertension On amlodipine and losartan   History of TIA (transient ischemic attack) MRI brain was negative for acute intracranial abnormality.  Hypothermia Resolved.  Hyponatremia Last sodium normal range.  Facial bruising, sequela improved  Metabolic acidosis Improved   Subjective: Patient was seen and examined today.  No new complaints.  Nursing concern of increased urinary urgency and frequency with some incontinence.  Physical Exam: Vitals:   06/20/22 2102 06/20/22 2105 06/21/22 0625 06/21/22 0805  BP: (!) 150/80 116/89 (!) 146/71 (!) 141/64  Pulse: 71 89 (!) 59 71  Resp: 16 (!) 22 15 18   Temp: 98.4 F (36.9 C) 98.4 F (36.9 C) 97.8 F (36.6 C)   TempSrc: Oral Oral Oral   SpO2: 97% 100% 100% 99%  Weight:      Height:       General.  Frail elderly lady, in no acute distress. Pulmonary.  Lungs clear bilaterally, normal respiratory effort. CV.  Regular rate and rhythm, no JVD, rub or murmur. Abdomen.  Soft, nontender, nondistended, BS positive. CNS.  Alert and oriented to self.  No focal neurologic deficit. Extremities.  No edema, no cyanosis, pulses intact and symmetrical. Psychiatry.  Judgment and insight appears impaired.   Data Reviewed: No new data  Family Communication: DSS is guardian  Disposition: Status is: Inpatient Remains inpatient appropriate because: We do not have a safe disposition.  TOC to see who was assigned this  patient's guardian.  Planned Discharge Destination: Memory care unit   Time spent: 38 minutes  This record has been created using Conservation officer, historic buildings. Errors have been sought and corrected,but may not always be located. Such creation errors do not reflect on the standard of care.   Author: Arnetha Courser, MD 06/21/2022 2:38 PM  For on call  review [email or web address].

## 2022-06-21 NOTE — Progress Notes (Signed)
Patient has had 2 bouts of urinary incontinence today which is different than her baseline. Katrina and Barrister's clerk notified.

## 2022-06-21 NOTE — Plan of Care (Signed)

## 2022-06-22 DIAGNOSIS — F01A0 Vascular dementia, mild, without behavioral disturbance, psychotic disturbance, mood disturbance, and anxiety: Secondary | ICD-10-CM | POA: Diagnosis not present

## 2022-06-22 NOTE — Plan of Care (Signed)
  Problem: Nutrition: Goal: Adequate nutrition will be maintained Outcome: Progressing   Problem: Clinical Measurements: Goal: Ability to maintain clinical measurements within normal limits will improve Outcome: Progressing   Problem: Education: Goal: Knowledge of General Education information will improve Description: Including pain rating scale, medication(s)/side effects and non-pharmacologic comfort measures Outcome: Progressing   Problem: Clinical Measurements: Goal: Will remain free from infection Outcome: Progressing   Problem: Pain Managment: Goal: General experience of comfort will improve Outcome: Progressing

## 2022-06-22 NOTE — Progress Notes (Signed)
Progress Note   Patient: Madeline Taylor ZOX:[id number] DOB: 02-Oct-1935 DOA: 04/22/2022     60 DOS: the patient was seen and examined on 06/22/2022   Brief hospital course: 87 y.o. female with medical history significant of dementia, hypertension, history of TIAs presenting with encephalopathy, hypothermia  Per daughter, patient had worsening dementia, frequently Lost walking out of the house.  But normally has good appetite.  For the last week, patient had significant increased agitation, has not been sleeping well. She also had a positive COVID about a week ago, repeat COVID was negative at admission. Patient is seen by PT/OT, no need for nursing home placement.  However unsafe discharge.  The patient has vascular dementia and does not have the ability to make medical decisions or sign paperwork for herself at this time.  Evening of 4/16, patient had a fall out of bed and has bruising left frontal and around the left eye.  4/27 in the evening spiked a fever of 102.  Blood cultures positive for E. coli and Started empirically on antibiotics and IV fluids.   4/29.  CT scan shows a pancreatic pseudocyst.  Tumor markers negative.  Patient medically stable to go out to a facility but we do not have a place.  5/29: Remained medically stable-awaiting disposition.  DSS is legal guardian now.  5/31: Patient was quite combative yesterday late afternoon, added as needed Haldol.  6/3: Hemodynamically stable.  Nursing concern of worsening confusion and increased urinary urgency and frequency with some incontinence.  Ordered CBC, BMP and UA with reflex to culture.  Holding any antibiotics for now.  6/4: Vital stable.  Labs which include CBC, BMP and UA was negative for any significant abnormality.  UA is not consistent with UTI.   Assessment and Plan: * Vascular dementia (HCC) Dementia with behavioral disturbance.  On Seroquel at night.  Patient calm and cooperative and answers questions.  Patient has  not been wandering.   Bacteremia due to Escherichia coli Resolved secondary to complete treatment.  Fever of 102 on 4/27.  Blood cultures positive for E. Coli secondary to urine.  Completed antibiotic course.  Pancreatic pseudocyst Seen on CT scan of the abdomen.  Tumor markers negative.  Essential hypertension On amlodipine and losartan   History of TIA (transient ischemic attack) MRI brain was negative for acute intracranial abnormality.  Hypothermia Resolved.  Hyponatremia Last sodium normal range.  Facial bruising, sequela improved  Metabolic acidosis Improved   Subjective: Patient is having mild dizziness while walking.  Physical Exam: Vitals:   06/21/22 0805 06/21/22 1458 06/22/22 0738 06/22/22 1511  BP: (!) 141/64 137/72 (!) 142/73 133/74  Pulse: 71 80 (!) 59 79  Resp: 18 17 16 20   Temp:  98.2 F (36.8 C) 97.8 F (36.6 C) 98.3 F (36.8 C)  TempSrc:  Oral    SpO2: 99% 99% 99% 100%  Weight:      Height:       General.  Frail elderly lady, in no acute distress. Pulmonary.  Lungs clear bilaterally, normal respiratory effort. CV.  Regular rate and rhythm, no JVD, rub or murmur. Abdomen.  Soft, nontender, nondistended, BS positive. CNS.  Alert and oriented .  No focal neurologic deficit. Extremities.  No edema, no cyanosis, pulses intact and symmetrical. Psychiatry.  Appears to have some cognitive impairment  Data Reviewed: Labs reviewed  Family Communication: DSS is guardian  Disposition: Status is: Inpatient Remains inpatient appropriate because: We do not have a safe disposition.  TOC  to see who was assigned this patient's guardian.  Planned Discharge Destination: Memory care unit   Time spent: 36 minutes  This record has been created using Conservation officer, historic buildings. Errors have been sought and corrected,but may not always be located. Such creation errors do not reflect on the standard of care.   Author: Arnetha Courser, MD 06/22/2022 3:15  PM  For on call review [email or web address].

## 2022-06-23 DIAGNOSIS — R4182 Altered mental status, unspecified: Secondary | ICD-10-CM | POA: Diagnosis not present

## 2022-06-23 DIAGNOSIS — F0150 Vascular dementia without behavioral disturbance: Secondary | ICD-10-CM | POA: Diagnosis not present

## 2022-06-23 NOTE — [place] FL2 (Addendum)
MEDICAID [id number] LEVEL OF CARE FORM     IDENTIFICATION  Patient Name: Madeline Taylor Birthdate: 06-11-1935 Sex: female Admission Date (Current Location): 04/22/2022  [place] and [place] Number:  [address] [id number] Pending Facility and Address:  [facility], [address] [place], [place] [address]      Provider Number: [id number]  Attending Physician Name and Address:  Leeroy Bock, MD  Relative Name and Phone Number:  [facility] DSS legal guardian Nancy Fetter, [phone]    Current Level of Care: Hospital Recommended Level of Care: Memory Care, Assisted Living Facility Prior Approval Number:    Date Approved/Denied:   PASRR Number: [id number]  Discharge Plan: Domiciliary (Rest home) (Memory Care ALF)    Current Diagnoses: Patient Active Problem List   Diagnosis Date Noted   Vascular dementia [facility])  Altered mental status 11/17/2020 06/23/2022   Severe dementia (HCC) 06/17/2022   Pancreatic pseudocyst 05/18/2022   Bacteremia due to Escherichia coli 05/16/2022   Hyponatremia 05/16/2022   Facial bruising, sequela 05/12/2022   Metabolic acidosis 04/24/2022   Dementia with behavioral disturbance (HCC) 04/24/2022   Hypothermia 04/23/2022   Mixed incontinence 02/12/2021   Aortic atherosclerosis (HCC) 11/17/2020   History of TIA (transient ischemic attack) 11/17/2020   Osteoarthritis of multiple joints 05/18/2018   Hyperlipidemia 07/29/2016   Essential hypertension 07/29/2016   Osteoporosis of femur without pathological fracture 07/29/2016    Orientation RESPIRATION BLADDER Height & Weight     Self  Normal Incontinent Weight: 108 lb 14.5 oz (49.4 kg) Height:  4\' 11"  (149.9 cm)  BEHAVIORAL SYMPTOMS/MOOD NEUROLOGICAL BOWEL NUTRITION STATUS  Wanderer   Continent Diet  AMBULATORY STATUS COMMUNICATION OF NEEDS Skin   Supervision Verbally Normal                       Personal Care Assistance Level of Assistance   Bathing, Feeding, Dressing Bathing Assistance: Limited assistance Feeding assistance: Limited assistance Dressing Assistance: Limited assistance     Functional Limitations Info  Sight, Hearing, Speech Sight Info: Adequate Hearing Info: Adequate Speech Info: Adequate    SPECIAL CARE FACTORS FREQUENCY                       Contractures Contractures Info: Not present    Additional Factors Info  Code Status, Allergies, Psychotropic Code Status Info: Full Code Allergies Info: Lisinopril  Statins Psychotropic Info: QUEtiapine (SEROQUEL) tablet 25 mg         Current Medications (06/23/2022):  This is the current hospital active medication list Current Facility-Administered Medications  Medication Dose Route Frequency Provider Last Rate Last Admin   acetaminophen (TYLENOL) tablet 650 mg  650 mg Oral Q6H PRN Alford Highland, MD   650 mg at 06/21/22 1657   alum & mag hydroxide-simeth (MAALOX/MYLANTA) 200-200-20 MG/5ML suspension 30 mL  30 mL Oral Q6H PRN Sharman Cheek, MD       amLODipine (NORVASC) tablet 5 mg  5 mg Oral Daily Marrion Coy, MD   5 mg at 06/23/22 1008   losartan (COZAAR) tablet 50 mg  50 mg Oral Daily Marrion Coy, MD   50 mg at 06/23/22 1008   Muscle Rub CREA   Topical PRN Floydene Flock, MD   Given at 06/05/22 0429   ondansetron (ZOFRAN) tablet 4 mg  4 mg Oral Q6H PRN Floydene Flock, MD       Or   ondansetron [facility]) injection 4 mg  4  mg Intravenous Q6H PRN Floydene Flock, MD       Oral care mouth rinse  15 mL Mouth Rinse PRN Alford Highland, MD       QUEtiapine (SEROQUEL) tablet 25 mg  25 mg Oral QHS Marrion Coy, MD   25 mg at 06/22/22 2159   risperiDONE (RISPERDAL) tablet 1 mg  1 mg Oral Q12H PRN Alford Highland, MD   1 mg at 06/21/22 2041   senna-docusate (Senokot-S) tablet 2 tablet  2 tablet Oral BID Marrion Coy, MD   2 tablet at 06/23/22 1008     Discharge Medications: Please see discharge summary for a list of discharge  medications.  Relevant Imaging Results:  Relevant Lab Results:   Additional Information SSN [id number]  Darleene Cleaver, LCSW

## 2022-06-23 NOTE — Progress Notes (Signed)
Progress Note   Patient: Madeline Taylor MVH:[id number] DOB: [birth year] DOA: 04/22/2022     61 DOS: the patient was seen and examined on 06/23/2022   Brief hospital course: 87 y.o. female with medical history significant of worsening dementia, hypertension, history of TIAs.  They presented with encephalopathy, hypothermia.  Per daughter report, patient has had worsening dementia, frequently wandering lost outside and has had acute agitation and insomnia leading to admission. Recent COVID diagnosis but not currently symptomatic.  No acute cause found on admission. PT/OT evaluated and physically not needing rehab but for mental status needing to be placed in memory care facility. Daughter unable to provide care at home.    Evening of 4/16, patient had a fall out of bed and has bruising left frontal and around the left eye.   4/27 in the evening spiked a fever of 102.  Blood cultures positive for E. coli and Started empirically on antibiotics and IV fluids.   4/29.  CT scan shows a pancreatic pseudocyst.  Tumor markers negative.   Patient medically stable to go out to a facility but we do not have a place.   5/29: Remained medically stable-awaiting disposition.  DSS is legal guardian now.   5/31: Patient was quite combative yesterday late afternoon, added as needed Haldol.   6/3: Hemodynamically stable.  Nursing concern of worsening confusion and increased urinary urgency and frequency with some incontinence.  Ordered CBC, BMP and UA with reflex to culture.  Holding any antibiotics for now.   6/4: Vital stable.  Labs which include CBC, BMP and UA was negative for any significant abnormality.  UA is not consistent with UTI.  6/5: patient is calm and cooperative and oriented x2. Denies concerns today. She continues to await for placement and has been without incident for several days.   Assessment and Plan: Vascular dementia [facility])  history TIA- no acute changes on brain MRI on admission. Neuro  signed off. BH signed off.  Dementia with behavioral disturbance.  On Seroquel at night.  Patient calm and cooperative and answers questions.  Patient has not been wandering.  - continue sitter and redirection - TOC engaged for placement. She is medically stable for dc  Bacteremia due to Escherichia coli- Completed antibiotic course.  Pancreatic pseudocyst Seen on CT scan of the abdomen.  Tumor markers negative.  Essential hypertension- well controlled.  - continue amlodipine and losartan  Hypothermia Resolved.  Hyponatremia Last sodium normal range.  Facial bruising, sequela improved  Metabolic acidosis Improved  Subjective: Patient reports no concerns today.   Physical Exam: Vitals:   06/22/22 0738 06/22/22 1511 06/22/22 2308 06/23/22 0705  BP: (!) 142/73 133/74 137/60 131/70  Pulse: (!) 59 79 66 64  Resp: 16 20 18 17   Temp: 97.8 F (36.6 C) 98.3 F (36.8 C) 97.6 F (36.4 C) 98.7 F (37.1 C)  TempSrc:   Oral   SpO2: 99% 100% 99% 94%  Weight:      Height:       General.  Frail elderly lady, in no acute distress. Pulmonary.  Lungs clear bilaterally, normal respiratory effort. CV.  Regular rate and rhythm, no JVD, rub or murmur. Abdomen.  Soft, nontender, nondistended, BS positive. CNS.  Alert and oriented .  No focal neurologic deficit. Extremities.  No edema, no cyanosis, pulses intact and symmetrical. Psychiatry.  Appears to have some cognitive impairment  Data Reviewed: Labs reviewed  Family Communication: DSS is guardian  Disposition: Status is: Inpatient Remains inpatient appropriate  because: We do not have a safe disposition.  TOC to see who was assigned this patient's guardian.  Planned Discharge Destination: Memory care unit   Time spent: 45 minutes  Author: Leeroy Bock, MD 06/23/2022 7:33 AM  For on call review [email or web address].

## 2022-06-23 NOTE — TOC Progression Note (Addendum)
Transition of Care [facility]) - Progression Note    Patient Details  Name: Madeline Taylor MRN: [id number] Date of Birth: 1935-02-06  Transition of Care [facility]) CM/SW Contact  Darleene Cleaver, [place] Phone Number: 06/23/2022, 2:28 PM  Clinical Narrative:     CSW contacted [facility] and spoke to Lewis he stated he has a wait list for his memory care unit.  CSW added patient to the wait list.  CSW spoke to Tammy at [facility] to see if she will review patient.  She said yes and to email her copies of the clinicals to [email or web address].    Email sent, waiting for response back, DSS is the legal guardian for patient, please make sure to speak to DSS about patient.  Expected Discharge Plan: Long Term Nursing Home Barriers to Discharge: No SNF bed  Expected Discharge Plan and Services       Living arrangements for the past 2 months: Single Family Home                 DME Arranged: Walker rolling DME Agency: [employer] Date DME Agency Contacted: 04/25/22 Time DME Agency Contacted: [phone] Representative spoke with at DME Agency: [name]             Social Determinants of Health (SDOH) Interventions SDOH Screenings   Food Insecurity: Patient Unable To Answer (04/23/2022)  Housing: Low Risk  (04/23/2022)  Transportation Needs: No Transportation Needs (04/23/2022)  Utilities: Not At Risk (04/23/2022)  Alcohol Screen: Low Risk  (05/21/2021)  Depression (PHQ2-9): Low Risk  (05/21/2021)  Financial Resource Strain: Low Risk  (05/21/2021)  Physical Activity: Inactive (05/21/2021)  Social Connections: Moderately Integrated (05/21/2021)  Stress: No Stress Concern Present (05/21/2021)  Tobacco Use: Low Risk  (04/23/2022)    Readmission Risk Interventions     No data to display

## 2022-06-24 NOTE — NC FL2 (Addendum)
[place] MEDICAID FL2 LEVEL OF CARE FORM     IDENTIFICATION  Patient Name: Madeline Taylor Birthdate: 05/06/1935 Sex: female Admission Date (Current Location): 04/22/2022  [place] and [place] Number:  [address] [id number] Q Pending Facility and Address:  [facility], [address] [place], [place] [address]      Provider Number: [id number]  Attending Physician Name and Address:   Kathrynn Running, MD Relative Name and Phone Number:  Nancy Fetter from [facility] DSS is legal guardian [phone]    Current Level of Care: Hospital Recommended Level of Care: Skilled Nursing Facility, Memory Care Prior Approval Number:    Date Approved/Denied:   PASRR Number: [id number]  Discharge Plan: SNF    Current Diagnoses: Patient Active Problem List   Diagnosis Date Noted   Altered mental status 06/23/2022   Severe dementia (HCC) 06/17/2022   Pancreatic pseudocyst 05/18/2022   Bacteremia due to Escherichia coli 05/16/2022   Hyponatremia 05/16/2022   Facial bruising, sequela 05/12/2022   Metabolic acidosis 04/24/2022   Dementia with behavioral disturbance (HCC) 04/24/2022   Hypothermia 04/23/2022   Mixed incontinence 02/12/2021   Aortic atherosclerosis (HCC) 11/17/2020   Vascular dementia (HCC) 11/17/2020   History of TIA (transient ischemic attack) 11/17/2020   Osteoarthritis of multiple joints 05/18/2018   Hyperlipidemia 07/29/2016   Essential hypertension 07/29/2016   Osteoporosis of femur without pathological fracture 07/29/2016    Orientation RESPIRATION BLADDER Height & Weight     Self  Normal Continent Weight: 108 lb 14.5 oz (49.4 kg) Height:  4\' 11"  (149.9 cm)  BEHAVIORAL SYMPTOMS/MOOD NEUROLOGICAL BOWEL NUTRITION STATUS  Wanderer   Continent Diet  AMBULATORY STATUS COMMUNICATION OF NEEDS Skin   Supervision Verbally Normal                       Personal Care Assistance Level of Assistance  Bathing, Feeding, Dressing Bathing  Assistance: Limited assistance Feeding assistance: Limited assistance Dressing Assistance: Limited assistance     Functional Limitations Info  Sight, Speech, Hearing Sight Info: Adequate Hearing Info: Adequate Speech Info: Adequate    SPECIAL CARE FACTORS FREQUENCY                       Contractures Contractures Info: Not present    Additional Factors Info  Code Status, Allergies Code Status Info: Full Code Allergies Info: Lisinopril  Statins Psychotropic Info: OLANZapine (ZYPREXA) tablet 5 mg, risperiDONE (RISPERDAL) tablet 1 mg         Current Medications (06/24/2022):  This is the current hospital active medication list Current Facility-Administered Medications  Medication Dose Route Frequency Provider Last Rate Last Admin   acetaminophen (TYLENOL) tablet 650 mg  650 mg Oral Q6H PRN Alford Highland, MD   650 mg at 06/21/22 1657   alum & mag hydroxide-simeth (MAALOX/MYLANTA) 200-200-20 MG/5ML suspension 30 mL  30 mL Oral Q6H PRN Sharman Cheek, MD       amLODipine (NORVASC) tablet 5 mg  5 mg Oral Daily Marrion Coy, MD   5 mg at 06/24/22 0932   losartan (COZAAR) tablet 50 mg  50 mg Oral Daily Marrion Coy, MD   50 mg at 06/24/22 0932   Muscle Rub CREA   Topical PRN Floydene Flock, MD   Given at 06/05/22 0429   ondansetron (ZOFRAN) tablet 4 mg  4 mg Oral Q6H PRN Floydene Flock, MD       Or   ondansetron [facility]) injection 4 mg  4 mg Intravenous Q6H PRN Floydene Flock, MD       Oral care mouth rinse  15 mL Mouth Rinse PRN Alford Highland, MD       QUEtiapine (SEROQUEL) tablet 25 mg  25 mg Oral QHS Marrion Coy, MD   25 mg at 06/23/22 2105   risperiDONE (RISPERDAL) tablet 1 mg  1 mg Oral Q12H PRN Alford Highland, MD   1 mg at 06/21/22 2041   senna-docusate (Senokot-S) tablet 2 tablet  2 tablet Oral BID Marrion Coy, MD   2 tablet at 06/24/22 0932     Discharge Medications: Please see discharge summary for a list of discharge medications.  Relevant  Imaging Results:  Relevant Lab Results:   Additional Information SSN [id number]  Darleene Cleaver, LCSW

## 2022-06-24 NOTE — Progress Notes (Signed)
Progress Note   Patient: Madeline Taylor ZOX:[id number] DOB: [birth year] DOA: 04/22/2022     87 DOS: the patient was seen and examined on 06/24/2022   Brief hospital course: 87 y.o. female with medical history significant of worsening dementia, hypertension, history of TIAs.  They presented with encephalopathy, hypothermia.  Per daughter report, patient has had worsening dementia, frequently wandering lost outside and has had acute agitation and insomnia leading to admission. Recent COVID diagnosis but not currently symptomatic.  No acute cause found on admission. PT/OT evaluated and physically not needing rehab but for mental status needing to be placed in memory care facility. Daughter unable to provide care at home.    Evening of 4/16, patient had a fall out of bed and has bruising left frontal and around the left eye.   4/27 in the evening spiked a fever of 102.  Blood cultures positive for E. coli and Started empirically on antibiotics and IV fluids.   4/29.  CT scan shows a pancreatic pseudocyst.  Tumor markers negative.   Patient medically stable to go out to a facility but we do not have a place.   5/29: Remained medically stable-awaiting disposition.  DSS is legal guardian now.   5/31: Patient was quite combative yesterday late afternoon, added as needed Haldol.   6/3: Hemodynamically stable.  Nursing concern of worsening confusion and increased urinary urgency and frequency with some incontinence.  Ordered CBC, BMP and UA with reflex to culture.  Holding any antibiotics for now.   6/4: Vital stable.  Labs which include CBC, BMP and UA was negative for any significant abnormality.  UA is not consistent with UTI.  6/5: patient is calm and cooperative and oriented x2. Denies concerns today. She continues to await for placement and has been without incident for several days.   6/6: Patient continues to be calm, not needing any PRN medications.  Assessment and Plan: Vascular dementia  [facility])  history TIA- no acute changes on brain MRI on admission. Neuro signed off. BH signed off.  Dementia with behavioral disturbance.  On Seroquel at night.  Patient calm and cooperative and answers questions.  Patient has not been wandering.  - continue sitter and redirection - TOC engaged for placement. She is medically stable for dc  Bacteremia due to Escherichia coli- Completed antibiotic course.  Pancreatic pseudocyst Seen on CT scan of the abdomen.  Tumor markers negative.  Essential hypertension- well controlled.  - continue amlodipine and losartan  Hypothermia Resolved.  Hyponatremia Last sodium normal range.  Facial bruising, sequela improved  Metabolic acidosis Improved  Subjective: Patient seen and examined, expressed eagerness to be discharged home  Physical Exam: Vitals:   06/23/22 0705 06/23/22 1545 06/23/22 2125 06/24/22 0930  BP: 131/70 136/76 (!) 147/79 133/67  Pulse: 64 82 72 78  Resp: 17 18 20 17   Temp: 98.7 F (37.1 C) 98.7 F (37.1 C) 97.9 F (36.6 C) 98.2 F (36.8 C)  TempSrc:      SpO2: 94% 98% 98% 99%  Weight:      Height:       General.  Frail elderly lady, in no acute distress. Pulmonary.  Lungs clear bilaterally, normal respiratory effort. CV.  Regular rate and rhythm, no JVD, rub or murmur. Abdomen.  Soft, nontender, nondistended, BS positive. CNS.  Alert and oriented .  No focal neurologic deficit. Extremities.  No edema, no cyanosis, pulses intact and symmetrical. Psychiatry.  Appears to have some cognitive impairment  Data Reviewed: Labs  reviewed  Family Communication: DSS is guardian  Disposition: Status is: Inpatient Remains inpatient appropriate because: We do not have a safe disposition.  TOC to see who was assigned this patient's guardian.  Planned Discharge Destination: Memory care unit   Time spent: 45 minutes  Author: Harold Hedge, MD 06/24/2022 11:46 AM  For on call review [email or web address].

## 2022-06-25 DIAGNOSIS — F01A0 Vascular dementia, mild, without behavioral disturbance, psychotic disturbance, mood disturbance, and anxiety: Secondary | ICD-10-CM | POA: Diagnosis not present

## 2022-06-25 NOTE — Progress Notes (Signed)
Progress Note   Patient: Madeline Taylor ZOX:[id number] DOB: 06/03/1935 DOA: 04/22/2022     65 DOS: the patient was seen and examined on 06/25/2022   Brief hospital course: 87 y.o. female with medical history significant of worsening dementia, hypertension, history of TIAs.  They presented with encephalopathy, hypothermia.  Per daughter report, patient has had worsening dementia, frequently wandering lost outside and has had acute agitation and insomnia leading to admission. Recent COVID diagnosis but not currently symptomatic.  No acute cause found on admission. PT/OT evaluated and physically not needing rehab but for mental status needing to be placed in memory care facility. Daughter unable to provide care at home.    Evening of 4/16, patient had a fall out of bed and has bruising left frontal and around the left eye.   4/27 in the evening spiked a fever of 102.  Blood cultures positive for E. coli and Started empirically on antibiotics and IV fluids.   4/29.  CT scan shows a pancreatic pseudocyst.  Tumor markers negative.   Patient medically stable to go out to a facility but we do not have a place.   5/29: Remained medically stable-awaiting disposition.  DSS is legal guardian now.   5/31: Patient was quite combative yesterday late afternoon, added as needed Haldol.   6/3: Hemodynamically stable.  Nursing concern of worsening confusion and increased urinary urgency and frequency with some incontinence.  Ordered CBC, BMP and UA with reflex to culture.  Holding any antibiotics for now.   6/4: Vital stable.  Labs which include CBC, BMP and UA was negative for any significant abnormality.  UA is not consistent with UTI.  6/5: patient is calm and cooperative and oriented x2. Denies concerns today. She continues to await for placement and has been without incident for several days.   6/6: Patient continues to be calm, not needing any PRN medications.  Assessment and Plan: Vascular dementia  [facility])  history TIA- no acute changes on brain MRI on admission. Neuro signed off. BH signed off.  Dementia with behavioral disturbance.  On Seroquel at night.  Patient calm and cooperative and answers questions.  Patient has not been wandering.  - continue sitter and redirection - TOC engaged for placement looking for memory care. She is medically stable for dc  Guardianship DSS is legal guardian  Bacteremia due to Escherichia coli- Completed antibiotic course.  Pancreatic pseudocyst Seen on CT scan of the abdomen.  Tumor markers negative.  Essential hypertension- well controlled.  - continue amlodipine and losartan  Hypothermia Resolved.  Hyponatremia Last sodium normal range.  Facial bruising, sequela improved  Metabolic acidosis Improved  Subjective: Patient seen and examined, no complaints, tolerating diet  Physical Exam: Vitals:   06/23/22 2125 06/24/22 0930 06/24/22 2355 06/25/22 0941  BP: (!) 147/79 133/67 (!) 131/54 131/66  Pulse: 72 78 69 67  Resp: 20 17 16 17   Temp: 97.9 F (36.6 C) 98.2 F (36.8 C)  97.7 F (36.5 C)  TempSrc:      SpO2: 98% 99% 97% 98%  Weight:      Height:       General.  Frail elderly lady, in no acute distress. Pulmonary.  Lungs clear bilaterally, normal respiratory effort. CV.  Regular rate and rhythm, no murmur Abdomen.  Soft, nontender, nondistended, BS positive. CNS.  Alert and oriented .  No focal neurologic deficit. Extremities.  No edema, no cyanosis, pulses intact and symmetrical. Psychiatry.  Appears to have some cognitive impairment  Data Reviewed: Labs reviewed  Family Communication: DSS is guardian  Disposition: Status is: Inpatient Remains inpatient appropriate because: We do not have a safe disposition.  TOC to see who was assigned this patient's guardian.  Planned Discharge Destination: Memory care unit   Time spent: 45 minutes  Author: Silvano Bilis, MD 06/25/2022 3:12 PM  For on call review  [email or web address].

## 2022-06-25 NOTE — TOC Progression Note (Signed)
Transition of Care [facility]) - Progression Note    Patient Details  Name: Madeline Taylor MRN: [id number] Date of Birth: 19-Oct-1935  [facility]) CM/SW Contact  Darleene Cleaver, [place] Phone Number: 06/25/2022, 6:01 PM  Clinical Narrative:     Referral sent to [facility] and Tammy at [facility] to see if they can accept patient on a memory care SNF.  CSW also emailed DSS to see if they have found anywhere for patient.  Expected Discharge Plan: Long Term Nursing Home Barriers to Discharge: No SNF bed  Expected Discharge Plan and Services       Living arrangements for the past 2 months: Single Family Home                 DME Arranged: Walker rolling DME Agency: [employer] Date DME Agency Contacted: 04/25/22 Time DME Agency Contacted: [phone] Representative spoke with at DME Agency: [name]             Social Determinants of Health (SDOH) Interventions SDOH Screenings   Food Insecurity: Patient Unable To Answer (04/23/2022)  Housing: Low Risk  (04/23/2022)  Transportation Needs: No Transportation Needs (04/23/2022)  Utilities: Not At Risk (04/23/2022)  Alcohol Screen: Low Risk  (05/21/2021)  Depression (PHQ2-9): Low Risk  (05/21/2021)  Financial Resource Strain: Low Risk  (05/21/2021)  Physical Activity: Inactive (05/21/2021)  Social Connections: Moderately Integrated (05/21/2021)  Stress: No Stress Concern Present (05/21/2021)  Tobacco Use: Low Risk  (04/23/2022)    Readmission Risk Interventions     No data to display

## 2022-06-25 NOTE — Plan of Care (Signed)

## 2022-06-26 DIAGNOSIS — F01A0 Vascular dementia, mild, without behavioral disturbance, psychotic disturbance, mood disturbance, and anxiety: Secondary | ICD-10-CM | POA: Diagnosis not present

## 2022-06-26 NOTE — Progress Notes (Signed)
PROGRESS NOTE    Madeline Taylor  UUV:[id number] DOB: 1935/08/22 DOA: 04/22/2022 PCP: [name], NP   Assessment & Plan:   Principal Problem:   Vascular dementia [facility]) Active Problems:   Bacteremia due to Escherichia coli   Pancreatic pseudocyst   Essential hypertension   History of TIA (transient ischemic attack)   Hypothermia   Osteoarthritis of multiple joints   Metabolic acidosis   Dementia with behavioral disturbance (HCC)   Facial bruising, sequela   Hyponatremia   Severe dementia (HCC)   Altered mental status  Assessment and Plan: Vascular dementia: w/ hx of TIA. Continue on seroquel. CM looking for placement at memory care facility.    Bacteremia: secondary to e.coli. Completed abx course   Pancreatic pseudocyst: seen on CT scan of the abdomen.Continue w/ supportive care    HTN: continue on amlodipine, losartan   Hypothermia: resolved  Hyponatremia: resolved    Facial bruising: improved    Metabolic acidosis: resolved         DVT prophylaxis: SCDs Code Status: full  Family Communication: Disposition Plan: unsafe d/c plan   Level of care: Telemetry Medical  Status is: Inpatient Remains inpatient appropriate because: medically stable. DSS is legal guardian.     Consultants:    Procedures:   Antimicrobials:    Subjective: Pt denies any complaints   Objective: Vitals:   06/23/22 2125 06/24/22 0930 06/24/22 2355 06/25/22 0941  BP: (!) 147/79 133/67 (!) 131/54 131/66  Pulse: 72 78 69 67  Resp: 20 17 16 17   Temp: 97.9 F (36.6 C) 98.2 F (36.8 C)  97.7 F (36.5 C)  TempSrc:      SpO2: 98% 99% 97% 98%  Weight:      Height:        Intake/Output Summary (Last 24 hours) at 06/26/2022 0823 Last data filed at 06/25/2022 1423 Gross per 24 hour  Intake 360 ml  Output --  Net 360 ml   Filed Weights   04/22/22 1757  Weight: 49.4 kg    Examination:  General exam: Appears calm and comfortable  Respiratory system: Clear to  auscultation. Respiratory effort normal. Cardiovascular system: S1 & S2+. No rubs, gallops or clicks.  Gastrointestinal system: Abdomen is nondistended, soft and nontender. Normal bowel sounds heard. Central nervous system: Alert and awake Psychiatry: Judgement and insight appears poor. Mood & affect appropriate.     Data Reviewed: I have personally reviewed following labs and imaging studies  CBC: Recent Labs  Lab 06/21/22 1455  WBC 5.5  HGB 12.7  HCT 39.5  MCV 93.8  PLT 232   Basic Metabolic Panel: Recent Labs  Lab 06/21/22 1455  NA 138  K 3.6  CL 104  CO2 25  GLUCOSE 122*  BUN 22  CREATININE 0.98  CALCIUM 8.9   GFR: Estimated Creatinine Clearance: 29.1 mL/min (by C-G formula based on SCr of 0.98 mg/dL). Liver Function Tests: No results for input(s): "AST", "ALT", "ALKPHOS", "BILITOT", "PROT", "ALBUMIN" in the last 168 hours. No results for input(s): "LIPASE", "AMYLASE" in the last 168 hours. No results for input(s): "AMMONIA" in the last 168 hours. Coagulation Profile: No results for input(s): "INR", "PROTIME" in the last 168 hours. Cardiac Enzymes: No results for input(s): "CKTOTAL", "CKMB", "CKMBINDEX", "TROPONINI" in the last 168 hours. BNP (last 3 results) No results for input(s): "PROBNP" in the last 8760 hours. HbA1C: No results for input(s): "HGBA1C" in the last 72 hours. CBG: No results for input(s): "GLUCAP" in the last 168  hours. Lipid Profile: No results for input(s): "CHOL", "HDL", "LDLCALC", "TRIG", "CHOLHDL", "LDLDIRECT" in the last 72 hours. Thyroid Function Tests: No results for input(s): "TSH", "T4TOTAL", "FREET4", "T3FREE", "THYROIDAB" in the last 72 hours. Anemia Panel: No results for input(s): "VITAMINB12", "FOLATE", "FERRITIN", "TIBC", "IRON", "RETICCTPCT" in the last 72 hours. Sepsis Labs: No results for input(s): "PROCALCITON", "LATICACIDVEN" in the last 168 hours.  No results found for this or any previous visit (from the past 240  hour(s)).       Radiology Studies: No results found.      Scheduled Meds:  amLODipine  5 mg Oral Daily   losartan  50 mg Oral Daily   QUEtiapine  25 mg Oral QHS   senna-docusate  2 tablet Oral BID   Continuous Infusions:   LOS: 64 days    Time spent: 25 mins     Charise Killian, MD [facility] Pager [phone]  If 7PM-7AM, please contact night-coverage [email or web address] 06/26/2022, 8:23 AM

## 2022-06-26 NOTE — Plan of Care (Signed)

## 2022-06-27 NOTE — Progress Notes (Signed)
Mobility Specialist - Progress Note   06/27/22 1145  Mobility  Activity Ambulated independently in hallway;Stood at bedside;Dangled on edge of bed  Level of Assistance Independent  Assistive Device None  Distance Ambulated (ft) 480 ft  Activity Response Tolerated well  Mobility Referral Yes  $Mobility charge 1 Mobility  Mobility Specialist Start Time (ACUTE ONLY) 1010  Mobility Specialist Stop Time (ACUTE ONLY) 1027  Mobility Specialist Time Calculation (min) (ACUTE ONLY) 17 min   Pt sitting EOB on RA upon arrival. Pt STS and ambulates in hallway indep. Pt returns to room with NT present.   Terrilyn Saver  Mobility Specialist  06/27/22 11:47 AM

## 2022-06-27 NOTE — Progress Notes (Signed)
PROGRESS NOTE   HPI was taken from Dr. Alvester Morin: Madeline Taylor is a 87 y.o. female with medical history significant of dementia, hypertension, history of TIAs presenting with encephalopathy, hypothermia.  Limited history in the setting of encephalopathy.  History primarily from her daughter.  Per report, patient was noted to be living on the poor living conditions as her husband recently went into a nursing facility.  Patient was found to be confused as well as living in substandard conditions.  The daughter states that she has been relatively estranged from her mom for several years.  However, has been trying to take care of her for the past 1 to 2 months.  Patient had recurring episodes of confusion, agitation at home.  Was recently evaluated by a primary care physician.  This was her first visit in approximately 2 years.  Was started on medication for blood pressure per the daughter.  No reports of fevers or chills.  No reports of nausea or vomiting.  No reports of chest pain or shortness of breath.  Noted worsening agitation and confusion. Initially presented to the ER afebrile, BP stable.  Was evaluated by behavioral health with no indication for inpatient evaluation.  Was given some Haldol and Seroquel in the setting of significant agitation.  Imaging was obtained with a CT head showing?  Subacute lacunar infarcts in the left thalamic regions.  Case was discussed with on-call neurologist Dr. Jerrell Belfast who recommended MRI for further evaluation.  While in the ER, patient became fairly hypothermic with temperatures at 96.1.  Bair hugger placed.  Initial white count from yesterday was 7.1, hemoglobin 12.8, Tylenol salicylate level within normal limits, urinalysis stable.  Creatinine 0.73.   As per Dr. Ashok Pall: 87 y.o. female with medical history significant of worsening dementia, hypertension, history of TIAs.  They presented with encephalopathy, hypothermia.   Per daughter report, patient has had worsening  dementia, frequently wandering lost outside and has had acute agitation and insomnia leading to admission. Recent COVID diagnosis but not currently symptomatic.  No acute cause found on admission. PT/OT evaluated and physically not needing rehab but for mental status needing to be placed in memory care facility. Daughter unable to provide care at home.    Evening of 4/16, patient had a fall out of bed and has bruising left frontal and around the left eye.   4/27 in the evening spiked a fever of 102.  Blood cultures positive for E. coli and Started empirically on antibiotics and IV fluids.   4/29.  CT scan shows a pancreatic pseudocyst.  Tumor markers negative.   Patient medically stable to go out to a facility but we do not have a place.   5/29: Remained medically stable-awaiting disposition.  DSS is legal guardian now.   5/31: Patient was quite combative yesterday late afternoon, added as needed Haldol.   6/3: Hemodynamically stable.  Nursing concern of worsening confusion and increased urinary urgency and frequency with some incontinence.  Ordered CBC, BMP and UA with reflex to culture.  Holding any antibiotics for now.   6/4: Vital stable.  Labs which include CBC, BMP and UA was negative for any significant abnormality.  UA is not consistent with UTI.   6/5: patient is calm and cooperative and oriented x2. Denies concerns today. She continues to await for placement and has been without incident for several days.    6/6: Patient continues to be calm, not needing any PRN medications.    As per Dr. Mayford Knife  6/8-06/27/22: Medically stable. Uneventful 2 days    [name]  QMV:[id number] DOB: 1935-08-31 DOA: 04/22/2022 PCP: [name], NP   Assessment & Plan:   Principal Problem:   Vascular dementia (HCC) Active Problems:   Bacteremia due to Escherichia coli   Pancreatic pseudocyst   Essential hypertension   History of TIA (transient ischemic attack)   Hypothermia    Osteoarthritis of multiple joints   Metabolic acidosis   Dementia with behavioral disturbance (HCC)   Facial bruising, sequela   Hyponatremia   Severe dementia (HCC)   Altered mental status  Assessment and Plan: Vascular dementia: w/ hx of TIA. Continue on seroquel. CM is looking for placement at memory care facility    Bacteremia: secondary to e.coli. Completed abx course   Pancreatic pseudocyst: seen on CT scan of the abdomen. Continue w/ supportive care    HTN: continue on losartan, amlodipine   Hypothermia: resolved  Hyponatremia: resolved    Facial bruising: improved    Metabolic acidosis: resolved         DVT prophylaxis: SCDs Code Status: full  Family Communication: Disposition Plan: unsafe d/c plan   Level of care: Telemetry Medical  Status is: Inpatient Remains inpatient appropriate because: medically stable. DSS is legal guardian. Still waiting on placement     Consultants:    Procedures:   Antimicrobials:    Subjective: Pt denies any complaints   Objective: Vitals:   06/25/22 0941 06/26/22 0700 06/26/22 1100 06/26/22 1828  BP: 131/66 (!) 142/69 137/66 (!) 156/78  Pulse: 67 80  78  Resp: 17 16  16   Temp: 97.7 F (36.5 C) 98 F (36.7 C)  97.9 F (36.6 C)  TempSrc:  Oral  Oral  SpO2: 98% 98%  100%  Weight:      Height:        Intake/Output Summary (Last 24 hours) at 06/27/2022 2841 Last data filed at 06/26/2022 1856 Gross per 24 hour  Intake 720 ml  Output --  Net 720 ml   Filed Weights   04/22/22 1757  Weight: 49.4 kg    Examination:  General exam: Appears calm and comfortable  Respiratory system: Clear to auscultation. Respiratory effort normal. Cardiovascular system: S1 & S2+. No rubs, gallops or clicks.  Gastrointestinal system: Abdomen is nondistended, soft and nontender. Normal bowel sounds heard. Central nervous system: Alert and awake Psychiatry: Judgement and insight appears poor. Mood & affect appropriate.      Data Reviewed: I have personally reviewed following labs and imaging studies  CBC: Recent Labs  Lab 06/21/22 1455  WBC 5.5  HGB 12.7  HCT 39.5  MCV 93.8  PLT 232   Basic Metabolic Panel: Recent Labs  Lab 06/21/22 1455  NA 138  K 3.6  CL 104  CO2 25  GLUCOSE 122*  BUN 22  CREATININE 0.98  CALCIUM 8.9   GFR: Estimated Creatinine Clearance: 29.1 mL/min (by C-G formula based on SCr of 0.98 mg/dL). Liver Function Tests: No results for input(s): "AST", "ALT", "ALKPHOS", "BILITOT", "PROT", "ALBUMIN" in the last 168 hours. No results for input(s): "LIPASE", "AMYLASE" in the last 168 hours. No results for input(s): "AMMONIA" in the last 168 hours. Coagulation Profile: No results for input(s): "INR", "PROTIME" in the last 168 hours. Cardiac Enzymes: No results for input(s): "CKTOTAL", "CKMB", "CKMBINDEX", "TROPONINI" in the last 168 hours. BNP (last 3 results) No results for input(s): "PROBNP" in the last 8760 hours. HbA1C: No results for input(s): "HGBA1C" in the last 72  hours. CBG: No results for input(s): "GLUCAP" in the last 168 hours. Lipid Profile: No results for input(s): "CHOL", "HDL", "LDLCALC", "TRIG", "CHOLHDL", "LDLDIRECT" in the last 72 hours. Thyroid Function Tests: No results for input(s): "TSH", "T4TOTAL", "FREET4", "T3FREE", "THYROIDAB" in the last 72 hours. Anemia Panel: No results for input(s): "VITAMINB12", "FOLATE", "FERRITIN", "TIBC", "IRON", "RETICCTPCT" in the last 72 hours. Sepsis Labs: No results for input(s): "PROCALCITON", "LATICACIDVEN" in the last 168 hours.  No results found for this or any previous visit (from the past 240 hour(s)).       Radiology Studies: No results found.      Scheduled Meds:  amLODipine  5 mg Oral Daily   losartan  50 mg Oral Daily   QUEtiapine  25 mg Oral QHS   senna-docusate  2 tablet Oral BID   Continuous Infusions:   LOS: 65 days    Time spent: 10 mins     Charise Killian, MD [facility] Pager [phone]  If 7PM-7AM, please contact night-coverage [email or web address] 06/27/2022, 8:22 AM

## 2022-06-28 DIAGNOSIS — F01A0 Vascular dementia, mild, without behavioral disturbance, psychotic disturbance, mood disturbance, and anxiety: Secondary | ICD-10-CM | POA: Diagnosis not present

## 2022-06-28 LAB — CBC
HCT: 35.8 % — ABNORMAL LOW (ref 36.0–46.0)
Hemoglobin: 11.6 g/dL — ABNORMAL LOW (ref 12.0–15.0)
MCH: 30.4 pg (ref 26.0–34.0)
MCHC: 32.4 g/dL (ref 30.0–36.0)
MCV: 94 fL (ref 80.0–100.0)
Platelets: 223 10*3/uL (ref 150–400)
RBC: 3.81 MIL/uL — ABNORMAL LOW (ref 3.87–5.11)
RDW: 13 % (ref 11.5–15.5)
WBC: 5.7 10*3/uL (ref 4.0–10.5)
nRBC: 0 % (ref 0.0–0.2)

## 2022-06-28 LAB — BASIC METABOLIC PANEL
Anion gap: 11 (ref 5–15)
BUN: 16 mg/dL (ref 8–23)
CO2: 20 mmol/L — ABNORMAL LOW (ref 22–32)
Calcium: 8.3 mg/dL — ABNORMAL LOW (ref 8.9–10.3)
Chloride: 107 mmol/L (ref 98–111)
Creatinine, Ser: 0.73 mg/dL (ref 0.44–1.00)
GFR, Estimated: >60 mL/min (ref >60)
Glucose, Bld: 87 mg/dL (ref 70–99)
Potassium: 3.5 mmol/L (ref 3.5–5.1)
Sodium: 138 mmol/L (ref 135–145)
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
## 2022-06-28 NOTE — Progress Notes (Signed)
PROGRESS NOTE   HPI was taken from Dr. Alvester Morin: Madeline Taylor is a 87 y.o. female with medical history significant of dementia, hypertension, history of TIAs presenting with encephalopathy, hypothermia.  Limited history in the setting of encephalopathy.  History primarily from her daughter.  Per report, patient was noted to be living on the poor living conditions as her husband recently went into a nursing facility.  Patient was found to be confused as well as living in substandard conditions.  The daughter states that she has been relatively estranged from her mom for several years.  However, has been trying to take care of her for the past 1 to 2 months.  Patient had recurring episodes of confusion, agitation at home.  Was recently evaluated by a primary care physician.  This was her first visit in approximately 2 years.  Was started on medication for blood pressure per the daughter.  No reports of fevers or chills.  No reports of nausea or vomiting.  No reports of chest pain or shortness of breath.  Noted worsening agitation and confusion. Initially presented to the ER afebrile, BP stable.  Was evaluated by behavioral health with no indication for inpatient evaluation.  Was given some Haldol and Seroquel in the setting of significant agitation.  Imaging was obtained with a CT head showing?  Subacute lacunar infarcts in the left thalamic regions.  Case was discussed with on-call neurologist Dr. Jerrell Belfast who recommended MRI for further evaluation.  While in the ER, patient became fairly hypothermic with temperatures at 96.1.  Bair hugger placed.  Initial white count from yesterday was 7.1, hemoglobin 12.8, Tylenol salicylate level within normal limits, urinalysis stable.  Creatinine 0.73.   As per Dr. Ashok Pall: 87 y.o. female with medical history significant of worsening dementia, hypertension, history of TIAs.  They presented with encephalopathy, hypothermia.   Per daughter report, patient has had worsening  dementia, frequently wandering lost outside and has had acute agitation and insomnia leading to admission. Recent COVID diagnosis but not currently symptomatic.  No acute cause found on admission. PT/OT evaluated and physically not needing rehab but for mental status needing to be placed in memory care facility. Daughter unable to provide care at home.    Evening of 4/16, patient had a fall out of bed and has bruising left frontal and around the left eye.   4/27 in the evening spiked a fever of 102.  Blood cultures positive for E. coli and Started empirically on antibiotics and IV fluids.   4/29.  CT scan shows a pancreatic pseudocyst.  Tumor markers negative.   Patient medically stable to go out to a facility but we do not have a place.   5/29: Remained medically stable-awaiting disposition.  DSS is legal guardian now.   5/31: Patient was quite combative yesterday late afternoon, added as needed Haldol.   6/3: Hemodynamically stable.  Nursing concern of worsening confusion and increased urinary urgency and frequency with some incontinence.  Ordered CBC, BMP and UA with reflex to culture.  Holding any antibiotics for now.   6/4: Vital stable.  Labs which include CBC, BMP and UA was negative for any significant abnormality.  UA is not consistent with UTI.   6/5: patient is calm and cooperative and oriented x2. Denies concerns today. She continues to await for placement and has been without incident for several days.    6/6: Patient continues to be calm, not needing any PRN medications.   6/: no change in patient's  status   GLAYDS ROHWEDER  AOZ:[id number] DOB: 30-Mar-1935 DOA: 04/22/2022 PCP: [name], NP   Assessment & Plan:   Principal Problem:   Vascular dementia [facility]) Active Problems:   Bacteremia due to Escherichia coli   Pancreatic pseudocyst   Essential hypertension   History of TIA (transient ischemic attack)   Hypothermia   Osteoarthritis of multiple joints   Metabolic  acidosis   Dementia with behavioral disturbance (HCC)   Facial bruising, sequela   Hyponatremia   Severe dementia (HCC)   Altered mental status  Assessment and Plan: Vascular dementia: w/ hx of TIA. Continue on seroquel. CM is looking for placement at memory care facility    Bacteremia: secondary to e.coli. Completed abx course   Pancreatic pseudocyst: seen on CT scan of the abdomen. Continue w/ supportive care    HTN: continue on losartan, amlodipine   Hypothermia: resolved  Hyponatremia: resolved    Facial bruising: improved    Metabolic acidosis: resolved         DVT prophylaxis: SCDs Code Status: full  Family Communication: Disposition Plan: unsafe d/c plan   Level of care: Telemetry Medical  Status is: Inpatient Remains inpatient appropriate because: medically stable. DSS is legal guardian. Still waiting on placement     Consultants:    Procedures:   Antimicrobials:    Subjective: Pt denies any complaints. Tolerating diet, ambulating around unit.   Objective: Vitals:   06/27/22 1000 06/27/22 1755 06/28/22 0017 06/28/22 0731  BP: 136/75 (!) 154/72 (!) 127/59 129/65  Pulse: 69 82 64 74  Resp:   18 18  Temp: 98.6 F (37 C) 98.4 F (36.9 C) 97.9 F (36.6 C) 98.5 F (36.9 C)  TempSrc: Oral Oral  Oral  SpO2:   96% 97%  Weight:      Height:        Intake/Output Summary (Last 24 hours) at 06/28/2022 1344 Last data filed at 06/28/2022 1100 Gross per 24 hour  Intake 1080 ml  Output --  Net 1080 ml   Filed Weights   04/22/22 1757  Weight: 49.4 kg    Examination:  General exam: Appears calm and comfortable  Respiratory system: Clear to auscultation. Respiratory effort normal. Cardiovascular system: S1 & S2+. No rubs, gallops or clicks.  Gastrointestinal system: Abdomen is nondistended, soft and nontender. Normal bowel sounds heard. Central nervous system: Alert and awake Psychiatry: calm, confused. Mood & affect appropriate.     Data  Reviewed: I have personally reviewed following labs and imaging studies  CBC: Recent Labs  Lab 06/21/22 1455 06/28/22 0445  WBC 5.5 5.7  HGB 12.7 11.6*  HCT 39.5 35.8*  MCV 93.8 94.0  PLT 232 223   Basic Metabolic Panel: Recent Labs  Lab 06/21/22 1455 06/28/22 0445  NA 138 138  K 3.6 3.5  CL 104 107  CO2 25 20*  GLUCOSE 122* 87  BUN 22 16  CREATININE 0.98 0.73  CALCIUM 8.9 8.3*   GFR: Estimated Creatinine Clearance: 35.7 mL/min (by C-G formula based on SCr of 0.73 mg/dL). Liver Function Tests: No results for input(s): "AST", "ALT", "ALKPHOS", "BILITOT", "PROT", "ALBUMIN" in the last 168 hours. No results for input(s): "LIPASE", "AMYLASE" in the last 168 hours. No results for input(s): "AMMONIA" in the last 168 hours. Coagulation Profile: No results for input(s): "INR", "PROTIME" in the last 168 hours. Cardiac Enzymes: No results for input(s): "CKTOTAL", "CKMB", "CKMBINDEX", "TROPONINI" in the last 168 hours. BNP (last 3 results) No results for input(s): "  PROBNP" in the last 8760 hours. HbA1C: No results for input(s): "HGBA1C" in the last 72 hours. CBG: No results for input(s): "GLUCAP" in the last 168 hours. Lipid Profile: No results for input(s): "CHOL", "HDL", "LDLCALC", "TRIG", "CHOLHDL", "LDLDIRECT" in the last 72 hours. Thyroid Function Tests: No results for input(s): "TSH", "T4TOTAL", "FREET4", "T3FREE", "THYROIDAB" in the last 72 hours. Anemia Panel: No results for input(s): "VITAMINB12", "FOLATE", "FERRITIN", "TIBC", "IRON", "RETICCTPCT" in the last 72 hours. Sepsis Labs: No results for input(s): "PROCALCITON", "LATICACIDVEN" in the last 168 hours.  No results found for this or any previous visit (from the past 240 hour(s)).       Radiology Studies: No results found.      Scheduled Meds:  amLODipine  5 mg Oral Daily   losartan  50 mg Oral Daily   QUEtiapine  25 mg Oral QHS   senna-docusate  2 tablet Oral BID   Continuous Infusions:    LOS: 66 days    Time spent: 15 mins     Silvano Bilis, MD [facility]  If 7PM-7AM, please contact night-coverage [email or web address] 06/28/2022, 1:44 PM

## 2022-06-28 NOTE — Plan of Care (Signed)

## 2022-06-28 NOTE — TOC Progression Note (Signed)
Transition of Care [facility]) - Progression Note    Patient Details  Name: AGNESS SIBRIAN MRN: [id number] Date of Birth: July 17, 1935  [facility]) CM/SW Contact  Halford Chessman Phone Number: 06/28/2022, 5:40 PM  Clinical Narrative:     Tammy at [facility] reviewing patient.  DSS is legal guardian.  Expected Discharge Plan: Long Term Nursing Home Barriers to Discharge: No SNF bed  Expected Discharge Plan and Services       Living arrangements for the past 2 months: Single Family Home                 DME Arranged: Walker rolling DME Agency: [employer] Date DME Agency Contacted: 04/25/22 Time DME Agency Contacted: [phone] Representative spoke with at DME Agency: [name]             Social Determinants of Health (SDOH) Interventions SDOH Screenings   Food Insecurity: Patient Unable To Answer (04/23/2022)  Housing: Low Risk  (04/23/2022)  Transportation Needs: No Transportation Needs (04/23/2022)  Utilities: Not At Risk (04/23/2022)  Alcohol Screen: Low Risk  (05/21/2021)  Depression (PHQ2-9): Low Risk  (05/21/2021)  Financial Resource Strain: Low Risk  (05/21/2021)  Physical Activity: Inactive (05/21/2021)  Social Connections: Moderately Integrated (05/21/2021)  Stress: No Stress Concern Present (05/21/2021)  Tobacco Use: Low Risk  (04/23/2022)    Readmission Risk Interventions     No data to display

## 2022-06-29 DIAGNOSIS — F03C0 Unspecified dementia, severe, without behavioral disturbance, psychotic disturbance, mood disturbance, and anxiety: Secondary | ICD-10-CM | POA: Diagnosis not present

## 2022-06-29 DIAGNOSIS — I10 Essential (primary) hypertension: Secondary | ICD-10-CM | POA: Diagnosis not present

## 2022-06-29 DIAGNOSIS — R7881 Bacteremia: Secondary | ICD-10-CM | POA: Diagnosis not present

## 2022-06-29 DIAGNOSIS — F01A0 Vascular dementia, mild, without behavioral disturbance, psychotic disturbance, mood disturbance, and anxiety: Secondary | ICD-10-CM | POA: Diagnosis not present

## 2022-06-29 NOTE — Progress Notes (Signed)
  Progress Note   Patient: Madeline Taylor ZOX:[id number] DOB: [birth year] DOA: 04/22/2022     67 DOS: the patient was seen and examined on 06/29/2022   Brief hospital course: As per HPI - Madeline Taylor is a 87 y.o. female with medical history significant of dementia, hypertension, history of TIAs presenting with encephalopathy, hypothermia.  Limited history in the setting of encephalopathy.  History primarily from her daughter.  Per report, patient was noted to be living on the poor living conditions as her husband recently went into a nursing facility.  Patient was found to be confused as well as living in substandard conditions.  The daughter states that she has been relatively estranged from her mom for several years.  However, has been trying to take care of her for the past 1 to 2 months.  Patient had recurring episodes of confusion, agitation at home.  Was recently evaluated by a primary care physician.  This was her first visit in approximately 2 years.  Was started on medication for blood pressure per the daughter.  No reports of fevers or chills.  No reports of nausea or vomiting.  No reports of chest pain or shortness of breath.  Noted worsening agitation and confusion.   During her prolonged hospital stay, no specific etiology found for her mental status change, had a fall 05/04/22 with left frontal bruising, left eye black. She developed fever of 102 on 4/27 blood cultures positive for E.coli treated with antibiotics. She remained hemodynamically stable, though on and off agitated, confused, required a sitter for safety.  Assessment and Plan: Vascular dementia: w/ hx of TIA.  Remains on and off agitated. Not sleeping well. Continue Seroquel, Risperdal. Sitter for safety. CM is looking for placement at memory care facility    E.coli bacteremia: Completed abx course. Remains afebrile. No sepsis.   Pancreatic pseudocyst: seen on CT scan of the abdomen. Continue w/ supportive care    HTN:  continue on losartan, amlodipine    Hypothermia: resolved   Hyponatremia: resolved    Head trauma s/p fall, facial bruising: improved    Metabolic acidosis: resolved       Subjective: Patient is seen and examined today morning. She is lying in bed, denies any complaints, sitter at bedside states she is behaving well.  Physical Exam: Vitals:   06/28/22 0731 06/28/22 1518 06/29/22 0549 06/29/22 1330  BP: 129/65 136/72 130/72 134/76  Pulse: 74 81 76 79  Resp: 18 20 17 15   Temp: 98.5 F (36.9 C) (!) 97.3 F (36.3 C) 98 F (36.7 C) 98.4 F (36.9 C)  TempSrc: Oral  Oral Oral  SpO2: 97% 100% 100% 100%  Weight:      Height:       General - Elderly Asian thin built female, no apparent distress HEENT - PERRLA, EOMI, atraumatic head, non tender sinuses. Lung - Clear, rales, rhonchi, wheezes. Heart - S1, S2 heard, no murmurs, rubs, no pedal edema Neuro - Alert, awake and oriented, non focal exam. Skin - Warm and dry. Data Reviewed:  CBC, BMP, vitals  Family Communication: Patient is currently awaiting safe dispo.  Disposition: Status is: Inpatient Remains inpatient appropriate because: safe disposition  Planned Discharge Destination: Skilled nursing facility    Time spent: 41 minutes  Author: [name], MD 06/29/2022 4:41 PM  For on call review [email or web address].

## 2022-06-29 NOTE — Plan of Care (Signed)
  Problem: Clinical Measurements: Goal: Will remain free from infection Outcome: Progressing   Problem: Clinical Measurements: Goal: Respiratory complications will improve Outcome: Progressing   Problem: Activity: Goal: Risk for activity intolerance will decrease Outcome: Progressing   Problem: Nutrition: Goal: Adequate nutrition will be maintained Outcome: Progressing   Problem: Elimination: Goal: Will not experience complications related to bowel motility Outcome: Progressing   Problem: Elimination: Goal: Will not experience complications related to urinary retention Outcome: Progressing   Problem: Pain Managment: Goal: General experience of comfort will improve Outcome: Progressing   Problem: Safety: Goal: Ability to remain free from injury will improve Outcome: Progressing

## 2022-06-29 NOTE — Plan of Care (Signed)

## 2022-06-30 DIAGNOSIS — F03918 Unspecified dementia, unspecified severity, with other behavioral disturbance: Secondary | ICD-10-CM | POA: Diagnosis not present

## 2022-06-30 DIAGNOSIS — S0083XS Contusion of other part of head, sequela: Secondary | ICD-10-CM | POA: Diagnosis not present

## 2022-06-30 DIAGNOSIS — I10 Essential (primary) hypertension: Secondary | ICD-10-CM | POA: Diagnosis not present

## 2022-06-30 DIAGNOSIS — E871 Hypo-osmolality and hyponatremia: Secondary | ICD-10-CM | POA: Diagnosis not present

## 2022-06-30 NOTE — Progress Notes (Signed)
  Progress Note   Patient: Madeline Taylor:[id number] DOB: 1935-12-22 DOA: 04/22/2022     68 DOS: the patient was seen and examined on 06/30/2022   Brief hospital course: As per HPI - Tabor YARIAH SELVEY is a 87 y.o. female with medical history significant of dementia, hypertension, history of TIAs presenting with encephalopathy, hypothermia.  Limited history in the setting of encephalopathy.  History primarily from her daughter.  Per report, patient was noted to be living on the poor living conditions as her husband recently went into a nursing facility.  Patient was found to be confused as well as living in substandard conditions.  The daughter states that she has been relatively estranged from her mom for several years.  However, has been trying to take care of her for the past 1 to 2 months.  Patient had recurring episodes of confusion, agitation at home.  Was recently evaluated by a primary care physician.  This was her first visit in approximately 2 years.  Was started on medication for blood pressure per the daughter.  No reports of fevers or chills.  No reports of nausea or vomiting.  No reports of chest pain or shortness of breath.  Noted worsening agitation and confusion.   During her prolonged hospital stay, no specific etiology found for her mental status change, had a fall 05/04/22 with left frontal bruising, left eye black. She developed fever of 102 on 4/27 blood cultures positive for E.coli treated with antibiotics. She remained hemodynamically stable, though on and off agitated, confused, required a sitter for safety.   Assessment and Plan: Vascular dementia: w/ hx of TIA.  Remains on and off agitated. Not sleeping well. Continue Seroquel, Risperdal. Sitter for safety. CM is looking for placement at memory care facility    E.coli bacteremia: Completed abx course. Remains afebrile. No sepsis.   Pancreatic pseudocyst: seen on CT scan of the abdomen. Continue w/ supportive care    HTN:  continue on losartan, amlodipine    Hypothermia: resolved   Hyponatremia: resolved    Head trauma s/p fall, facial bruising: improved    Metabolic acidosis: resolved       Subjective: Patient is seen and examined today morning. She is sitting in chair, upset that she lost her broom to clean the room, denies any complaints, sitter at bedside. Eating fair.  Physical Exam: Vitals:   06/29/22 1330 06/29/22 2038 06/30/22 0805 06/30/22 1650  BP: 134/76 130/76 135/64 (!) 159/69  Pulse: 79 74 69 90  Resp: 15 15 18 15   Temp: 98.4 F (36.9 C) 98.4 F (36.9 C) 98.7 F (37.1 C) 98 F (36.7 C)  TempSrc: Oral Oral Oral   SpO2: 100% 100% 98% 100%  Weight:      Height:       General - Elderly Asian thin built female, no apparent distress HEENT - PERRLA, EOMI, atraumatic head, non tender sinuses. Lung - Clear, rales, rhonchi, wheezes. Heart - S1, S2 heard, no murmurs, rubs, no pedal edema Neuro - Alert, awake and oriented, non focal exam. Skin - Warm and dry. Data Reviewed:  none  Family Communication: Patient is currently awaiting safe dispo.  Disposition: Status is: Inpatient Remains inpatient appropriate because: safe disposition  Planned Discharge Destination: Skilled nursing facility    Time spent: 40 minutes  Author: [name], MD 06/30/2022 5:16 PM  For on call review [email or web address].

## 2022-06-30 NOTE — TOC Progression Note (Signed)
Transition of Care [facility]) - Progression Note    Patient Details  Name: Madeline Taylor MRN: [id number] Date of Birth: 06/27/1935  [facility]) CM/SW Contact  Darleene Cleaver, [place] Phone Number: 06/30/2022, 5:15pm  Clinical Narrative:     Per Tammy at [facility], she has a couple of facilities that have a memory care unit and they are considering patient.  CSW left message with Tiffany at [facility] to get an update on if they are able to accept patient.  [facility] DSS has been updated on possible bed offers.  CSW continuing to follow patient's progress throughout discharge planning.   Expected Discharge Plan: Long Term Nursing Home Barriers to Discharge: No SNF bed  Expected Discharge Plan and Services       Living arrangements for the past 2 months: Single Family Home                 DME Arranged: Walker rolling DME Agency: [employer] Date DME Agency Contacted: 04/25/22 Time DME Agency Contacted: [phone] Representative spoke with at DME Agency: [name]             Social Determinants of Health (SDOH) Interventions SDOH Screenings   Food Insecurity: Patient Unable To Answer (04/23/2022)  Housing: Low Risk  (04/23/2022)  Transportation Needs: No Transportation Needs (04/23/2022)  Utilities: Not At Risk (04/23/2022)  Alcohol Screen: Low Risk  (05/21/2021)  Depression (PHQ2-9): Low Risk  (05/21/2021)  Financial Resource Strain: Low Risk  (05/21/2021)  Physical Activity: Inactive (05/21/2021)  Social Connections: Moderately Integrated (05/21/2021)  Stress: No Stress Concern Present (05/21/2021)  Tobacco Use: Low Risk  (04/23/2022)    Readmission Risk Interventions     No data to display

## 2022-06-30 NOTE — Plan of Care (Signed)
  Problem: Education: Goal: Knowledge of General Education information will improve Description: Including pain rating scale, medication(s)/side effects and non-pharmacologic comfort measures Outcome: Progressing   Problem: Health Behavior/Discharge Planning: Goal: Ability to manage health-related needs will improve Outcome: Progressing   Problem: Clinical Measurements: Goal: Ability to maintain clinical measurements within normal limits will improve Outcome: Progressing Goal: Will remain free from infection Outcome: Progressing   Problem: Pain Managment: Goal: General experience of comfort will improve Outcome: Progressing   

## 2022-07-01 DIAGNOSIS — Z8673 Personal history of transient ischemic attack (TIA), and cerebral infarction without residual deficits: Secondary | ICD-10-CM | POA: Diagnosis not present

## 2022-07-01 DIAGNOSIS — F03C0 Unspecified dementia, severe, without behavioral disturbance, psychotic disturbance, mood disturbance, and anxiety: Secondary | ICD-10-CM | POA: Diagnosis not present

## 2022-07-01 DIAGNOSIS — I10 Essential (primary) hypertension: Secondary | ICD-10-CM | POA: Diagnosis not present

## 2022-07-01 DIAGNOSIS — M159 Polyosteoarthritis, unspecified: Secondary | ICD-10-CM | POA: Diagnosis not present

## 2022-07-01 NOTE — Progress Notes (Signed)
Mobility Specialist - Progress Note   07/01/22 1320  Mobility  Activity Ambulated independently to bathroom;Stood at bedside;Dangled on edge of bed  Level of Assistance Independent  Assistive Device None  Distance Ambulated (ft) 15 ft  Activity Response Tolerated well  Mobility Referral Yes  $Mobility charge 1 Mobility  Mobility Specialist Start Time (ACUTE ONLY) 0103  Mobility Specialist Stop Time (ACUTE ONLY) 0110  Mobility Specialist Time Calculation (min) (ACUTE ONLY) 7 min   Pt EOB on RA upon arrival. Pt STS and ambulates to/from bathroom indep. Pt defers out of room mobility this date. Pt left EOB on RA upon arrival.   Terrilyn Saver  Mobility Specialist  07/01/22 1:21 PM

## 2022-07-01 NOTE — Plan of Care (Signed)

## 2022-07-01 NOTE — Progress Notes (Signed)
  Progress Note   Patient: Madeline Taylor DOB: [birth year] DOA: 04/22/2022     69 DOS: the patient was seen and examined on 07/01/2022   Brief hospital course: As per HPI - Madeline Taylor is a 87 y.o. female with medical history significant of dementia, hypertension, history of TIAs presenting with encephalopathy, hypothermia.  Limited history in the setting of encephalopathy.  History primarily from her daughter.  Per report, patient was noted to be living on the poor living conditions as her husband recently went into a nursing facility.  Patient was found to be confused as well as living in substandard conditions.  The daughter states that she has been relatively estranged from her mom for several years.  However, has been trying to take care of her for the past 1 to 2 months.  Patient had recurring episodes of confusion, agitation at home.  Was recently evaluated by a primary care physician.  This was her first visit in approximately 2 years.  Was started on medication for blood pressure per the daughter.  No reports of fevers or chills.  No reports of nausea or vomiting.  No reports of chest pain or shortness of breath.  Noted worsening agitation and confusion.   During her prolonged hospital stay, no specific etiology found for her mental status change, had a fall 05/04/22 with left frontal bruising, left eye black. She developed fever of 102 on 4/27 blood cultures positive for E.coli treated with antibiotics. She remained hemodynamically stable, though on and off confused, agitated mostly at nights, requiring a sitter for safety.   Assessment and Plan: Vascular dementia: w/ hx of TIA.  Remains on and off agitated mostly at nights due to sundowning. Continue Seroquel, Risperdal. Sitter for safety. CM is looking for placement at memory care facility.   E.coli bacteremia: Completed abx course. Remains afebrile. No sepsis.   Pancreatic pseudocyst: seen on CT scan of the abdomen. Continue  w/ supportive care    HTN: continue on losartan, amlodipine    Hypothermia: resolved   Hyponatremia: resolved    Head trauma s/p fall, facial bruising: improved    Metabolic acidosis: resolved       Subjective: Patient is seen and examined today morning. She is sitting in chair, eating breakfast, pleasant, denies any complaints, sitter at bedside. Eating fair. Gets confused in evening per sitter.  Physical Exam: Vitals:   06/29/22 2038 06/30/22 0805 06/30/22 1650 07/01/22 0913  BP: 130/76 135/64 (!) 159/69 (!) 136/58  Pulse: 74 69 90 64  Resp: 15 18 15 16   Temp: 98.4 F (36.9 C) 98.7 F (37.1 C) 98 F (36.7 C) 98.1 F (36.7 C)  TempSrc: Oral Oral    SpO2: 100% 98% 100% 99%  Weight:      Height:       General - Elderly Asian thin built female, no apparent distress HEENT - PERRLA, EOMI, atraumatic head, non tender sinuses. Lung - Clear, rales, rhonchi, wheezes. Heart - S1, S2 heard, no murmurs, rubs, no pedal edema Neuro - Alert, awake and oriented, non focal exam. Skin - Warm and dry. Data Reviewed:  none  Family Communication: Patient is currently awaiting safe dispo.  Disposition: Status is: Inpatient Remains inpatient appropriate because: safe disposition, challenging dispo plan.  Planned Discharge Destination: Skilled nursing facility    Time spent: 41 minutes  Author: [name], MD 07/01/2022 10:37 AM  For on call review [email or web address].

## 2022-07-01 NOTE — TOC Progression Note (Signed)
Transition of Care [facility]) - Progression Note    Patient Details  Name: Madeline Taylor MRN: [id number] Date of Birth: 10/09/1935  Transition of Care [facility]) CM/SW Contact  Darleene Cleaver, [place] Phone Number: 07/01/2022, 6:48 PM  Clinical Narrative:    Spoke to Tammy at [facility], she is still reviewing patient's information.  TOC to continue to follow patient throughout progress.   Expected Discharge Plan: Long Term Nursing Home Barriers to Discharge: No SNF bed  Expected Discharge Plan and Services       Living arrangements for the past 2 months: Single Family Home                 DME Arranged: Walker rolling DME Agency: [employer] Date DME Agency Contacted: 04/25/22 Time DME Agency Contacted: [phone] Representative spoke with at DME Agency: [name]             Social Determinants of Health (SDOH) Interventions SDOH Screenings   Food Insecurity: Patient Unable To Answer (04/23/2022)  Housing: Low Risk  (04/23/2022)  Transportation Needs: No Transportation Needs (04/23/2022)  Utilities: Not At Risk (04/23/2022)  Alcohol Screen: Low Risk  (05/21/2021)  Depression (PHQ2-9): Low Risk  (05/21/2021)  Financial Resource Strain: Low Risk  (05/21/2021)  Physical Activity: Inactive (05/21/2021)  Social Connections: Moderately Integrated (05/21/2021)  Stress: No Stress Concern Present (05/21/2021)  Tobacco Use: Low Risk  (04/23/2022)    Readmission Risk Interventions     No data to display

## 2022-07-02 DIAGNOSIS — I10 Essential (primary) hypertension: Secondary | ICD-10-CM | POA: Diagnosis not present

## 2022-07-02 DIAGNOSIS — F01A0 Vascular dementia, mild, without behavioral disturbance, psychotic disturbance, mood disturbance, and anxiety: Secondary | ICD-10-CM | POA: Diagnosis not present

## 2022-07-02 DIAGNOSIS — E871 Hypo-osmolality and hyponatremia: Secondary | ICD-10-CM | POA: Diagnosis not present

## 2022-07-02 DIAGNOSIS — Z8673 Personal history of transient ischemic attack (TIA), and cerebral infarction without residual deficits: Secondary | ICD-10-CM | POA: Diagnosis not present

## 2022-07-02 NOTE — Progress Notes (Signed)
Progress Note   Patient: Madeline Taylor:[id number] DOB: 02/20/1935 DOA: 04/22/2022     70 DOS: the patient was seen and examined on 07/02/2022   Brief hospital course: As per HPI - Ellinore ERIYAN REIGNER is a 87 y.o. female with medical history significant of dementia, hypertension, history of TIAs presenting with encephalopathy, hypothermia.  Limited history in the setting of encephalopathy.  History primarily from her daughter.  Per report, patient was noted to be living on the poor living conditions as her husband recently went into a nursing facility.  Patient was found to be confused as well as living in substandard conditions.  The daughter states that she has been relatively estranged from her mom for several years.  However, has been trying to take care of her for the past 1 to 2 months.  Patient had recurring episodes of confusion, agitation at home.  Was recently evaluated by a primary care physician.  This was her first visit in approximately 2 years.  Was started on medication for blood pressure per the daughter.  No reports of fevers or chills.  No reports of nausea or vomiting.  No reports of chest pain or shortness of breath.  Noted worsening agitation and confusion.   During her prolonged hospital stay, no specific etiology found for her mental status change, had a fall 05/04/22 with left frontal bruising, left eye black. She developed fever of 102 on 4/27 blood cultures positive for E.coli treated with antibiotics. She remained hemodynamically stable, though on and off confused, agitated mostly at nights, requiring a sitter for safety.   Assessment and Plan: Vascular dementia: w/ hx of TIA.  Remains on and off agitated mostly at nights due to sundowning. Continue Seroquel, Risperdal. Sitter for safety. Dispo challenging. CM is looking for placement at memory care facility.   E.coli bacteremia: Completed abx course. Remains afebrile. No sepsis.   Pancreatic pseudocyst: seen on CT scan of  the abdomen. Continue w/ supportive care    HTN: continue on losartan, amlodipine    Hypothermia: resolved   Hyponatremia: resolved    Head trauma s/p fall, facial bruising: improved    Metabolic acidosis: resolved       Subjective: Patient is seen and examined today morning. She is lying in bed, seems confused but pleasant. Does not remember events like her husband came yesterday and I had chat with him. States she is ready to go home, packed her clothes. Sitter at bedside. Eating fair.   Physical Exam: Vitals:   07/01/22 2100 07/02/22 0654 07/02/22 1146 07/02/22 1511  BP: (!) 161/68 (!) 156/71 (!) 160/74 (!) 145/74  Pulse: 74 71  77  Resp: 16 16  17   Temp: (!) 97.4 F (36.3 C) 98 F (36.7 C)    TempSrc:      SpO2: 98% 100%  99%  Weight:      Height:       General - Elderly Asian thin built female, no apparent distress HEENT - PERRLA, EOMI, atraumatic head, non tender sinuses. Lung - Clear, rales, rhonchi, wheezes. Heart - S1, S2 heard, no murmurs, rubs, no pedal edema Neuro - Alert, awake and oriented, non focal exam. Skin - Warm and dry. Data Reviewed:  none  Family Communication: Discussed with patient's husband of 41yrs yesterday who wants her to come home. But told him that they can't be dependant on each other now due to her current condition. He understands.  Disposition: Status is: Inpatient Remains inpatient appropriate because: safe  disposition, challenging dispo plan.  Planned Discharge Destination: Skilled nursing facility    Time spent: 41 minutes  Author: [name], MD 07/02/2022 5:21 PM  For on call review [email or web address].

## 2022-07-02 NOTE — TOC Progression Note (Signed)
Transition of Care [facility]) - Progression Note    Patient Details  Name: Madeline Taylor MRN: [id number] Date of Birth: [birth year]  Transition of Care [facility]) CM/SW Contact  Marlowe Sax, RN Phone Number: 07/02/2022, 9:38 AM  Clinical Narrative:     Reached out to [facility] [employer] and [facility] asking if they can make a bed offer in Memory care, awaiting a response   Expected Discharge Plan: Long Term Nursing Home Barriers to Discharge: No SNF bed  Expected Discharge Plan and Services       Living arrangements for the past 2 months: Single Family Home                 DME Arranged: Walker rolling DME Agency: [employer] Date DME Agency Contacted: 04/25/22 Time DME Agency Contacted: [phone] Representative spoke with at DME Agency: [name]             Social Determinants of Health (SDOH) Interventions SDOH Screenings   Food Insecurity: Patient Unable To Answer (04/23/2022)  Housing: Low Risk  (04/23/2022)  Transportation Needs: No Transportation Needs (04/23/2022)  Utilities: Not At Risk (04/23/2022)  Alcohol Screen: Low Risk  (05/21/2021)  Depression (PHQ2-9): Low Risk  (05/21/2021)  Financial Resource Strain: Low Risk  (05/21/2021)  Physical Activity: Inactive (05/21/2021)  Social Connections: Moderately Integrated (05/21/2021)  Stress: No Stress Concern Present (05/21/2021)  Tobacco Use: Low Risk  (04/23/2022)    Readmission Risk Interventions     No data to display

## 2022-07-03 DIAGNOSIS — Z8673 Personal history of transient ischemic attack (TIA), and cerebral infarction without residual deficits: Secondary | ICD-10-CM | POA: Diagnosis not present

## 2022-07-03 DIAGNOSIS — I10 Essential (primary) hypertension: Secondary | ICD-10-CM | POA: Diagnosis not present

## 2022-07-03 DIAGNOSIS — F01A0 Vascular dementia, mild, without behavioral disturbance, psychotic disturbance, mood disturbance, and anxiety: Secondary | ICD-10-CM | POA: Diagnosis not present

## 2022-07-03 DIAGNOSIS — R7881 Bacteremia: Secondary | ICD-10-CM | POA: Diagnosis not present

## 2022-07-03 NOTE — Plan of Care (Signed)

## 2022-07-03 NOTE — Progress Notes (Signed)
Progress Note   Patient: Madeline Taylor:[id number] DOB: 07-03-1935 DOA: 04/22/2022     71 DOS: the patient was seen and examined on 07/03/2022   Brief hospital course: As per HPI - Madeline Taylor is a 87 y.o. female with medical history significant of dementia, hypertension, history of TIAs presenting with encephalopathy, hypothermia.  Limited history in the setting of encephalopathy.  History primarily from her daughter.  Per report, patient was noted to be living on the poor living conditions as her husband recently went into a nursing facility.  Patient was found to be confused as well as living in substandard conditions.  The daughter states that she has been relatively estranged from her mom for several years.  However, has been trying to take care of her for the past 1 to 2 months.  Patient had recurring episodes of confusion, agitation at home.  Was recently evaluated by a primary care physician.  This was her first visit in approximately 2 years.  Was started on medication for blood pressure per the daughter.  No reports of fevers or chills.  No reports of nausea or vomiting.  No reports of chest pain or shortness of breath.  Noted worsening agitation and confusion.   During her prolonged hospital stay, no specific etiology found for her mental status change, had a fall 05/04/22 with left frontal bruising, left eye black. She developed fever of 102 on 4/27 blood cultures positive for E.coli treated with antibiotics. She remained hemodynamically stable, though on and off confused, agitated mostly at nights, requiring a sitter for safety.   Assessment and Plan: Vascular dementia: w/ hx of TIA.  Remains on and off agitated mostly at nights due to sundowning. Continue Seroquel, Risperdal. Sitter for safety. Dispo challenging. CM is looking for placement at memory care facility.   E.coli bacteremia: Completed abx course. Remains afebrile. No sepsis.   Pancreatic pseudocyst: seen on CT scan of  the abdomen. Continue w/ supportive care    HTN: continue on losartan, amlodipine    Hypothermia: resolved   Hyponatremia: resolved    Head trauma s/p fall, facial bruising: improved    Metabolic acidosis: resolved       Subjective: Patient is seen and examined today morning. She is lying in bed. Pleasantly confused. Sitter at bedside. Eating fair.   Physical Exam: Vitals:   07/02/22 0654 07/02/22 1146 07/02/22 1511 07/03/22 0906  BP: (!) 156/71 (!) 160/74 (!) 145/74 (!) 124/56  Pulse: 71  77 73  Resp: 16  17 18   Temp: 98 F (36.7 C)   97.9 F (36.6 C)  TempSrc:      SpO2: 100%  99% 99%  Weight:      Height:       General - Elderly Asian thin built female, no apparent distress HEENT - PERRLA, EOMI, atraumatic head, non tender sinuses. Lung - Clear, rales, rhonchi, wheezes. Heart - S1, S2 heard, no murmurs, rubs, no pedal edema Neuro - Alert, awake and oriented, non focal exam. Skin - Warm and dry. Data Reviewed:  none  Family Communication: Discussed with patient's husband of 45yrs yesterday who wants her to come home. But told him that they can't be dependant on each other now due to her current condition. He understands.  Disposition: Status is: Inpatient Remains inpatient appropriate because: safe disposition, challenging dispo plan.  Planned Discharge Destination: Skilled nursing facility    Time spent: 41 minutes  Author: [name], MD 07/03/2022 5:04 PM  For  on call review [email or web address].

## 2022-07-04 DIAGNOSIS — F01A0 Vascular dementia, mild, without behavioral disturbance, psychotic disturbance, mood disturbance, and anxiety: Secondary | ICD-10-CM | POA: Diagnosis not present

## 2022-07-04 NOTE — Progress Notes (Signed)
Mobility Specialist - Progress Note   07/04/22 1028  Mobility  Activity Ambulated independently in hallway;Stood at bedside;Dangled on edge of bed  Level of Assistance Independent  Assistive Device None  Distance Ambulated (ft) 500 ft  Activity Response Tolerated well  Mobility Referral Yes  $Mobility charge 1 Mobility  Mobility Specialist Start Time (ACUTE ONLY) [phone]  Mobility Specialist Stop Time (ACUTE ONLY) 1001  Mobility Specialist Time Calculation (min) (ACUTE ONLY) 19 min   Pt supine in bed on RA upon arrival. Pt completes bed mobility, dons socks/shoes, and ambulates in hallway indep. Pt returns to bed with needs in reach and sitter present.   Madeline Taylor  Mobility Specialist  07/04/22 10:30 AM

## 2022-07-04 NOTE — Progress Notes (Signed)
Progress Note   Patient: Madeline Taylor ZOX:[id number] DOB: December 20, 1935 DOA: 04/22/2022     72 DOS: the patient was seen and examined on 07/04/2022   Brief hospital course: As per HPI - Madeline Taylor is a 87 y.o. female with medical history significant of dementia, hypertension, history of TIAs presenting with encephalopathy, hypothermia.  Limited history in the setting of encephalopathy.  History primarily from her daughter.  Per report, patient was noted to be living on the poor living conditions as her husband recently went into a nursing facility.  Patient was found to be confused as well as living in substandard conditions.  The daughter states that she has been relatively estranged from her mom for several years.  However, has been trying to take care of her for the past 1 to 2 months.  Patient had recurring episodes of confusion, agitation at home.  Was recently evaluated by a primary care physician.  This was her first visit in approximately 2 years.  Was started on medication for blood pressure per the daughter.  No reports of fevers or chills.  No reports of nausea or vomiting.  No reports of chest pain or shortness of breath.  Noted worsening agitation and confusion.   During her prolonged hospital stay, no specific etiology found for her mental status change, had a fall 05/04/22 with left frontal bruising, left eye black. She developed fever of 102 on 4/27 blood cultures positive for E.coli treated with antibiotics. She remained hemodynamically stable, though on and off confused, agitated mostly at nights, requiring a sitter for safety.   Assessment and Plan: Vascular dementia: w/ hx of TIA.  Remains on and off agitated mostly at nights due to sundowning. Continue Seroquel, Risperdal. Sitter for safety. Dispo challenging. CM is looking for placement at memory care facility.   E.coli bacteremia: Completed abx course. Remains afebrile. No sepsis.   Pancreatic pseudocyst: seen on CT scan of  the abdomen. Continue w/ supportive care    HTN: continue on losartan, amlodipine    Hypothermia: resolved   Hyponatremia: resolved    Head trauma s/p fall, facial bruising: improved    Metabolic acidosis: resolved       Subjective: Patient awake sitting up in bed this AM, sitter at bedside. Pt reports feeling well.  No acute complaints.    Physical Exam: Vitals:   07/03/22 0906 07/03/22 2108 07/04/22 0051 07/04/22 0742  BP: (!) 124/56 (!) 152/96 (!) 126/59 (!) 151/69  Pulse: 73 70 61 60  Resp: 18 16 20 18   Temp: 97.9 F (36.6 C) 98.3 F (36.8 C) 97.9 F (36.6 C) 97.8 F (36.6 C)  TempSrc:      SpO2: 99% 98% 97% 100%  Weight:      Height:       General exam: awake, alert, no acute distress HEENT: moist mucus membranes, hearing grossly normal  Respiratory system: CTAB, no wheezes, rales or rhonchi, normal respiratory effort. Cardiovascular system: normal S1/S2, RRR, no pedal edema.   Gastrointestinal system: soft, NT, ND, no HSM felt, +bowel sounds. Central nervous system: no gross focal neurologic deficits, normal speech Extremities: moves all, no edema, normal tone Skin: dry, intact, normal temperature Psychiatry: normal mood, congruent affect    Data Reviewed: No new labs today.   Family Communication: None present. No medical updates.  Per Dr. Clide Dales 6/15: "Discussed with patient's husband of 35 yrs yesterday who wants her to come home. But told him that they can't be dependant on each  other now due to her current condition. He understands."  Disposition: Status is: Inpatient Remains inpatient appropriate because: safe disposition, challenging dispo plan.   Planned Discharge Destination: Skilled nursing facility    Time spent: 25 minutes  Author: Pennie Banter, DO 07/04/2022 1:01 PM  For on call review [email or web address].

## 2022-07-04 NOTE — Plan of Care (Signed)

## 2022-07-05 DIAGNOSIS — F01A0 Vascular dementia, mild, without behavioral disturbance, psychotic disturbance, mood disturbance, and anxiety: Secondary | ICD-10-CM | POA: Diagnosis not present

## 2022-07-05 NOTE — Plan of Care (Signed)

## 2022-07-05 NOTE — Progress Notes (Signed)
Progress Note   Patient: Madeline Taylor VHQ:[id number] DOB: March 20, 1935 DOA: 04/22/2022     73 DOS: the patient was seen and examined on 07/05/2022   Brief hospital course: As per HPI - Karlena EMMANUELA PERRINS is a 87 y.o. female with medical history significant of dementia, hypertension, history of TIAs presenting with encephalopathy, hypothermia.  Limited history in the setting of encephalopathy.  History primarily from her daughter.  Per report, patient was noted to be living on the poor living conditions as her husband recently went into a nursing facility.  Patient was found to be confused as well as living in substandard conditions.  The daughter states that she has been relatively estranged from her mom for several years.  However, has been trying to take care of her for the past 1 to 2 months.  Patient had recurring episodes of confusion, agitation at home.  Was recently evaluated by a primary care physician.  This was her first visit in approximately 2 years.  Was started on medication for blood pressure per the daughter.  No reports of fevers or chills.  No reports of nausea or vomiting.  No reports of chest pain or shortness of breath.  Noted worsening agitation and confusion.   During her prolonged hospital stay, no specific etiology found for her mental status change, had a fall 05/04/22 with left frontal bruising, left eye black. She developed fever of 102 on 4/27 blood cultures positive for E.coli treated with antibiotics. She remained hemodynamically stable, though on and off confused, agitated mostly at nights, requiring a sitter for safety.   Assessment and Plan: Vascular dementia: w/ hx of TIA.  Remains on and off agitated mostly at nights due to sundowning. Continue Seroquel, Risperdal. Sitter for safety. Dispo challenging. CM is looking for placement at memory care facility.   E.coli bacteremia: Completed abx course. Remains afebrile. No sepsis.   Pancreatic pseudocyst: seen on CT scan of  the abdomen. Continue w/ supportive care    HTN: continue on losartan, amlodipine    Hypothermia: resolved   Hyponatremia: resolved    Head trauma s/p fall, facial bruising: improved    Metabolic acidosis: resolved       Subjective: Patient awake sitting up in bed this AM, sitter at bedside. Pt reports feeling well.  No acute complaints.    Physical Exam: Vitals:   07/04/22 0742 07/04/22 1507 07/04/22 2356 07/05/22 0715  BP: (!) 151/69 (!) 149/84 (!) 131/57 133/67  Pulse: 60 79 66 61  Resp: 18 18 16 16   Temp: 97.8 F (36.6 C) 98.7 F (37.1 C) 97.9 F (36.6 C) 98.2 F (36.8 C)  TempSrc:    Oral  SpO2: 100% 98% 96% 99%  Weight:      Height:       General exam: awake, alert, no acute distress HEENT: moist mucus membranes, hearing grossly normal  Respiratory system: CTAB, no wheezes, rales or rhonchi, normal respiratory effort. Cardiovascular system: normal S1/S2, RRR, no pedal edema.   Gastrointestinal system: soft, NT, ND, no HSM felt, +bowel sounds. Central nervous system: no gross focal neurologic deficits, normal speech Extremities: moves all, no edema, normal tone Skin: dry, intact, normal temperature Psychiatry: normal mood, congruent affect    Data Reviewed: No new labs today.   Family Communication: None present. No medical updates.  Per Dr. Clide Dales 6/15: "Discussed with patient's husband of 35 yrs yesterday who wants her to come home. But told him that they can't be dependant on each other  now due to her current condition. He understands."  Disposition: Status is: Inpatient Remains inpatient appropriate because: safe disposition, challenging dispo plan.   Planned Discharge Destination: Skilled nursing facility    Time spent: 25 minutes  Author: Pennie Banter, DO 07/05/2022 11:57 AM  For on call review [email or web address].

## 2022-07-05 NOTE — TOC Progression Note (Signed)
Transition of Care [facility]) - Progression Note    Patient Details  Name: Madeline Taylor MRN: [id number] Date of Birth: 14-Mar-1935  [facility]) CM/SW Contact  Darleene Cleaver, [place] Phone Number: 07/05/2022, 5:47 PM  Clinical Narrative:     CSW spoke to DSS worker [name] Manson Passey, [phone], she said [employer] is planning to review patient to see if she would be a good candidate for the memory care.  Per Tammy at [facility], she does not feel patient has skillable needs, and would be more appropriate for a memory care ALF.  CSW also spoke to [place] at [facility], and she is reviewing patient's information since a bed has just opened up in their memory care unit.  TOC updated [name] [employer]  Expected Discharge Plan: Long Term Nursing Home Barriers to Discharge: No SNF bed  Expected Discharge Plan and Services       Living arrangements for the past 2 months: Single Family Home                 DME Arranged: Walker rolling DME Agency: [employer] Date DME Agency Contacted: 04/25/22 Time DME Agency Contacted: [phone] Representative spoke with at DME Agency: [name]             Social Determinants of Health (SDOH) Interventions SDOH Screenings   Food Insecurity: Patient Unable To Answer (04/23/2022)  Housing: Low Risk  (04/23/2022)  Transportation Needs: No Transportation Needs (04/23/2022)  Utilities: Not At Risk (04/23/2022)  Alcohol Screen: Low Risk  (05/21/2021)  Depression (PHQ2-9): Low Risk  (05/21/2021)  Financial Resource Strain: Low Risk  (05/21/2021)  Physical Activity: Inactive (05/21/2021)  Social Connections: Moderately Integrated (05/21/2021)  Stress: No Stress Concern Present (05/21/2021)  Tobacco Use: Low Risk  (04/23/2022)    Readmission Risk Interventions     No data to display

## 2022-07-06 DIAGNOSIS — F01A0 Vascular dementia, mild, without behavioral disturbance, psychotic disturbance, mood disturbance, and anxiety: Secondary | ICD-10-CM | POA: Diagnosis not present

## 2022-07-06 NOTE — Plan of Care (Signed)

## 2022-07-06 NOTE — Progress Notes (Signed)
Progress Note   Patient: Madeline Taylor JXB:[id number] DOB: 11-Apr-1935 DOA: 04/22/2022     74 DOS: the patient was seen and examined on 07/06/2022   Brief hospital course: As per HPI - Madeline Taylor is a 87 y.o. female with medical history significant of dementia, hypertension, history of TIAs presenting with encephalopathy, hypothermia.  Limited history in the setting of encephalopathy.  History primarily from her daughter.  Per report, patient was noted to be living on the poor living conditions as her husband recently went into a nursing facility.  Patient was found to be confused as well as living in substandard conditions.  The daughter states that she has been relatively estranged from her mom for several years.  However, has been trying to take care of her for the past 1 to 2 months.  Patient had recurring episodes of confusion, agitation at home.  Was recently evaluated by a primary care physician.  This was her first visit in approximately 2 years.  Was started on medication for blood pressure per the daughter.  No reports of fevers or chills.  No reports of nausea or vomiting.  No reports of chest pain or shortness of breath.  Noted worsening agitation and confusion.   During her prolonged hospital stay, no specific etiology found for her mental status change, had a fall 05/04/22 with left frontal bruising, left eye black. She developed fever of 102 on 4/27 blood cultures positive for E.coli treated with antibiotics. She remained hemodynamically stable, though on and off confused, agitated mostly at nights, requiring a sitter for safety.   Assessment and Plan: Vascular dementia: w/ hx of TIA.  Remains on and off agitated mostly at nights due to sundowning. Continue Seroquel, Risperdal. Sitter for safety. Dispo challenging. CM is looking for placement at memory care facility.   E.coli bacteremia: Completed abx course. Remains afebrile. No sepsis.   Pancreatic pseudocyst: seen on CT scan of  the abdomen. Continue w/ supportive care    HTN: continue on losartan, amlodipine    Hypothermia: resolved   Hyponatremia: resolved    Head trauma s/p fall, facial bruising: improved    Metabolic acidosis: resolved       Subjective: Patient awake sitting up in bed this AM, sitter at bedside. Pt denies any acute complaints.  Reports feeling well.    Physical Exam: Vitals:   07/05/22 0715 07/06/22 0022 07/06/22 0802 07/06/22 0803  BP: 133/67 133/68  (!) 118/52  Pulse: 61 68 (!) 59 (!) 59  Resp: 16 18 17 17   Temp: 98.2 F (36.8 C) 98.2 F (36.8 C) 97.8 F (36.6 C) 97.8 F (36.6 C)  TempSrc: Oral Oral    SpO2: 99% 97% 98% 98%  Weight:      Height:       General exam: awake, alert, no acute distress HEENT: moist mucus membranes, hearing grossly normal  Respiratory system: on room air, normal respiratory effort. Cardiovascular system: RRR, no pedal edema.   Central nervous system: no gross focal neurologic deficits, normal speech Extremities: moves all, no edema, normal tone Skin: dry, intact, normal temperature Psychiatry: normal mood, congruent affect    Data Reviewed: No new labs today.   Family Communication: None present. No medical updates.  Per Dr. Clide Dales 6/15: "Discussed with patient's husband of 35 yrs yesterday who wants her to come home. But told him that they can't be dependant on each other now due to her current condition. He understands."  Disposition: Status is: Inpatient Remains  inpatient appropriate because: safe disposition, challenging dispo plan.   Planned Discharge Destination: Skilled nursing facility    Time spent: 25 minutes  Author: Pennie Banter, DO 07/06/2022 12:07 PM  For on call review [email or web address]

## 2022-07-07 DIAGNOSIS — F01B18 Vascular dementia, moderate, with other behavioral disturbance: Secondary | ICD-10-CM | POA: Diagnosis not present

## 2022-07-07 LAB — BASIC METABOLIC PANEL
Anion gap: 7 (ref 5–15)
BUN: 18 mg/dL (ref 8–23)
CO2: 23 mmol/L (ref 22–32)
Calcium: 8.3 mg/dL — ABNORMAL LOW (ref 8.9–10.3)
Chloride: 107 mmol/L (ref 98–111)
Creatinine, Ser: 0.61 mg/dL (ref 0.44–1.00)
GFR, Estimated: >60 mL/min (ref >60)
Glucose, Bld: 90 mg/dL (ref 70–99)
Potassium: 3.9 mmol/L (ref 3.5–5.1)
Sodium: 137 mmol/L (ref 135–145)

## 2022-07-07 LAB — CBC
HCT: 36.1 % (ref 36.0–46.0)
Hemoglobin: 11.5 g/dL — ABNORMAL LOW (ref 12.0–15.0)
MCH: 29.9 pg (ref 26.0–34.0)
MCHC: 31.9 g/dL (ref 30.0–36.0)
MCV: 94 fL (ref 80.0–100.0)
Platelets: 219 10*3/uL (ref 150–400)
RBC: 3.84 MIL/uL — ABNORMAL LOW (ref 3.87–5.11)
RDW: 12.7 % (ref 11.5–15.5)
WBC: 5.5 10*3/uL (ref 4.0–10.5)
nRBC: 0 % (ref 0.0–0.2)

## 2022-07-07 NOTE — Plan of Care (Signed)

## 2022-07-07 NOTE — Progress Notes (Signed)
Progress Note   Patient: Madeline Taylor XBM:[id number] DOB: Sep 24, 1935 DOA: 04/22/2022     75 DOS: the patient was seen and examined on 07/07/2022    Subjective:  Patient evaluated in the presence of sitter Denies any acute overnight events Currently awaiting placement  Brief hospital course: As per HPI - Madeline Taylor is a 87 y.o. female with medical history significant of dementia, hypertension, history of TIAs presenting with encephalopathy, hypothermia.  Limited history in the setting of encephalopathy.  History primarily from her daughter.  Per report, patient was noted to be living on the poor living conditions as her husband recently went into a nursing facility.  Patient was found to be confused as well as living in substandard conditions.  The daughter states that she has been relatively estranged from her mom for several years.  However, has been trying to take care of her for the past 1 to 2 months.  Patient had recurring episodes of confusion, agitation at home.  Was recently evaluated by a primary care physician.  This was her first visit in approximately 2 years.  Was started on medication for blood pressure per the daughter.  No reports of fevers or chills.  No reports of nausea or vomiting.  No reports of chest pain or shortness of breath.  Noted worsening agitation and confusion.    During her prolonged hospital stay, no specific etiology found for her mental status change, had a fall 05/04/22 with left frontal bruising, left eye black. She developed fever of 102 on 4/27 blood cultures positive for E.coli treated with antibiotics. She remained hemodynamically stable, though on and off confused, agitated mostly at nights, requiring a sitter for safety.    Assessment and Plan: Vascular dementia: w/ hx of TIA.  Remains on and off agitated mostly at nights due to sundowning. Continue Seroquel, Risperdal. Sitter for safety. Dispo challenging. CM is looking for placement at memory care  facility.   E.coli bacteremia: Completed abx course. Remains afebrile. No sepsis.   Pancreatic pseudocyst: seen on CT scan of the abdomen. Continue w/ supportive care    HTN: continue on losartan, amlodipine    Hypothermia: resolved   Hyponatremia: resolved    Head trauma s/p fall, facial bruising: improved    Metabolic acidosis: resolved       Physical Exam:  General exam: awake, alert, no acute distress HEENT: moist mucus membranes, hearing grossly normal  Respiratory system: on room air, normal respiratory effort. Cardiovascular system: RRR, no pedal edema.   Central nervous system: no gross focal neurologic deficits, normal speech Extremities: moves all, no edema, normal tone Skin: dry, intact, normal temperature Psychiatry: normal mood, congruent affect       Data Reviewed: No new labs today.     Family Communication: None present. No medical updates.   Per Dr. Clide Dales 6/15: "Discussed with patient's husband of 35 yrs yesterday who wants her to come home. But told him that they can't be dependant on each other now due to her current condition. He understands."   Disposition: Status is: Inpatient Remains inpatient appropriate because: safe disposition, challenging dispo plan.    Planned Discharge Destination: Skilled nursing facility       Vitals:   07/06/22 0022 07/06/22 0802 07/06/22 0803 07/07/22 0808  BP: 133/68  (!) 118/52 137/61  Pulse: 68 (!) 59 (!) 59 (!) 59  Resp: 18 17 17 18   Temp: 98.2 F (36.8 C) 97.8 F (36.6 C) 97.8 F (36.6 C) 98  F (36.7 C)  TempSrc: Oral     SpO2: 97% 98% 98% 98%  Weight:      Height:         Author: Loyce Dys, MD 07/07/2022 2:01 PM  For on call review [email or web address].

## 2022-07-07 NOTE — Progress Notes (Signed)
Patient refused to take medications and noticed episode of combativeness and agitation as verbalized by sitter as well. Decreased stimulation and will retry to offer medication when calm. Monitoring continued, sitter at the bedside.

## 2022-07-08 DIAGNOSIS — F01C18 Vascular dementia, severe, with other behavioral disturbance: Secondary | ICD-10-CM | POA: Diagnosis not present

## 2022-07-08 LAB — BASIC METABOLIC PANEL
Anion gap: 10 (ref 5–15)
BUN: 20 mg/dL (ref 8–23)
CO2: 23 mmol/L (ref 22–32)
Calcium: 8.4 mg/dL — ABNORMAL LOW (ref 8.9–10.3)
Chloride: 107 mmol/L (ref 98–111)
Creatinine, Ser: 0.74 mg/dL (ref 0.44–1.00)
GFR, Estimated: >60 mL/min (ref >60)
Glucose, Bld: 89 mg/dL (ref 70–99)
Potassium: 3.8 mmol/L (ref 3.5–5.1)
Sodium: 140 mmol/L (ref 135–145)

## 2022-07-08 LAB — CBC WITH DIFFERENTIAL/PLATELET
Abs Immature Granulocytes: 0.02 10*3/uL (ref 0.00–0.07)
Basophils Absolute: 0 10*3/uL (ref 0.0–0.1)
Basophils Relative: 1 %
Eosinophils Absolute: 0.1 10*3/uL (ref 0.0–0.5)
Eosinophils Relative: 2 %
HCT: 36.4 % (ref 36.0–46.0)
Hemoglobin: 11.6 g/dL — ABNORMAL LOW (ref 12.0–15.0)
Immature Granulocytes: 0 %
Lymphocytes Relative: 33 %
Lymphs Abs: 1.7 10*3/uL (ref 0.7–4.0)
MCH: 29.7 pg (ref 26.0–34.0)
MCHC: 31.9 g/dL (ref 30.0–36.0)
MCV: 93.1 fL (ref 80.0–100.0)
Monocytes Absolute: 0.4 10*3/uL (ref 0.1–1.0)
Monocytes Relative: 8 %
Neutro Abs: 2.8 10*3/uL (ref 1.7–7.7)
Neutrophils Relative %: 56 %
Platelets: 219 10*3/uL (ref 150–400)
RBC: 3.91 MIL/uL (ref 3.87–5.11)
RDW: 12.8 % (ref 11.5–15.5)
WBC: 5 10*3/uL (ref 4.0–10.5)
nRBC: 0 % (ref 0.0–0.2)

## 2022-07-08 NOTE — Progress Notes (Signed)
Patient is getting agitated and aggressive, out of her room trying to escape the unit, Sitter, this RN, and other nursing staff following the patient in the hallways. Hospitalist notified and informed that patient refused to take night medicine and agitated. Redirected to go back to room. Decreased stimulation, 1:1 sitter in the room. Hospitalist aware and  to let her know if any need arise.

## 2022-07-08 NOTE — Plan of Care (Signed)

## 2022-07-08 NOTE — Progress Notes (Signed)
Progress Note   Patient: Madeline Taylor:[id number] DOB: [birth year] DOA: 04/22/2022     76 DOS: the patient was seen and examined on 07/08/2022    Subjective:  Patient seen and examined this morning in the presence of his sitter Denies any overnight events Medically stable awaiting placement   Brief hospital course: As per HPI - Madeline Taylor is a 87 y.o. female with medical history significant of dementia, hypertension, history of TIAs presenting with encephalopathy, hypothermia.  Limited history in the setting of encephalopathy.  History primarily from her daughter.  Per report, patient was noted to be living on the poor living conditions as her husband recently went into a nursing facility.  Patient was found to be confused as well as living in substandard conditions.  The daughter states that she has been relatively estranged from her mom for several years.  However, has been trying to take care of her for the past 1 to 2 months.  Patient had recurring episodes of confusion, agitation at home.  Was recently evaluated by a primary care physician.  This was her first visit in approximately 2 years.  Was started on medication for blood pressure per the daughter.  No reports of fevers or chills.  No reports of nausea or vomiting.  No reports of chest pain or shortness of breath.  Noted worsening agitation and confusion.    During her prolonged hospital stay, no specific etiology found for her mental status change, had a fall 05/04/22 with left frontal bruising, left eye black. She developed fever of 102 on 4/27 blood cultures positive for E.coli treated with antibiotics. She remained hemodynamically stable, though on and off confused, agitated mostly at nights, requiring a sitter for safety.    Assessment and Plan: Vascular dementia: w/ hx of TIA.  Remains on and off agitated mostly at nights due to sundowning. Continue Seroquel, Risperdal. Sitter for safety. Dispo challenging. CM is looking for  placement at memory care facility.   E.coli bacteremia: Completed abx course. Remains afebrile. No sepsis.   Pancreatic pseudocyst: seen on CT scan of the abdomen. Continue w/ supportive care    HTN: continue on losartan, amlodipine    Hypothermia: resolved   Hyponatremia: resolved    Head trauma s/p fall, facial bruising: improved    Metabolic acidosis: resolved        Physical Exam:   General exam: In no obvious distress HEENT: moist mucus membranes, hearing grossly normal  Respiratory system: on room air, normal respiratory effort. Cardiovascular system: RRR, no pedal edema.   Central nervous system: no gross focal neurologic deficits, normal speech Extremities: moves all, no edema, normal tone Skin: dry, intact, normal temperature Psychiatry: normal mood, congruent affect       Data Reviewed: No new labs today.     Family Communication: None present. No medical updates.   Per Dr. Clide Dales 6/15: "Discussed with patient's husband of 35 yrs yesterday who wants her to come home. But told him that they can't be dependant on each other now due to her current condition. He understands."   Disposition: Status is: Inpatient Remains inpatient appropriate because: safe disposition, challenging dispo plan.    Planned Discharge Destination: Skilled nursing facility      Vitals:   07/07/22 2309 07/08/22 0809 07/08/22 1148 07/08/22 1552  BP: (!) 142/64 (!) 144/75 92/82 (!) 154/72  Pulse: 73 72 68 76  Resp: 20 18 17 18   Temp: 98.5 F (36.9 C) 98.6 F (37 C)  98.9 F (37.2 C) 98.3 F (36.8 C)  TempSrc:  Oral Oral Oral  SpO2: 99% 99% 99% 91%  Weight:      Height:         Author: Loyce Dys, MD 07/08/2022 4:27 PM  For on call review [email or web address].

## 2022-07-09 DIAGNOSIS — F01C18 Vascular dementia, severe, with other behavioral disturbance: Secondary | ICD-10-CM | POA: Diagnosis not present

## 2022-07-09 LAB — CBC WITH DIFFERENTIAL/PLATELET
Abs Immature Granulocytes: 0.02 10*3/uL (ref 0.00–0.07)
Basophils Absolute: 0 10*3/uL (ref 0.0–0.1)
Basophils Relative: 0 %
Eosinophils Absolute: 0.1 10*3/uL (ref 0.0–0.5)
Eosinophils Relative: 2 %
HCT: 37.2 % (ref 36.0–46.0)
Hemoglobin: 11.9 g/dL — ABNORMAL LOW (ref 12.0–15.0)
Immature Granulocytes: 0 %
Lymphocytes Relative: 27 %
Lymphs Abs: 1.4 10*3/uL (ref 0.7–4.0)
MCH: 30 pg (ref 26.0–34.0)
MCHC: 32 g/dL (ref 30.0–36.0)
MCV: 93.7 fL (ref 80.0–100.0)
Monocytes Absolute: 0.3 10*3/uL (ref 0.1–1.0)
Monocytes Relative: 6 %
Neutro Abs: 3.2 10*3/uL (ref 1.7–7.7)
Neutrophils Relative %: 65 %
Platelets: 221 10*3/uL (ref 150–400)
RBC: 3.97 MIL/uL (ref 3.87–5.11)
RDW: 12.8 % (ref 11.5–15.5)
WBC: 5 10*3/uL (ref 4.0–10.5)
nRBC: 0 % (ref 0.0–0.2)

## 2022-07-09 LAB — BASIC METABOLIC PANEL
Anion gap: 9 (ref 5–15)
BUN: 19 mg/dL (ref 8–23)
CO2: 23 mmol/L (ref 22–32)
Calcium: 8.5 mg/dL — ABNORMAL LOW (ref 8.9–10.3)
Chloride: 108 mmol/L (ref 98–111)
Creatinine, Ser: 0.6 mg/dL (ref 0.44–1.00)
GFR, Estimated: >60 mL/min (ref >60)
Glucose, Bld: 97 mg/dL (ref 70–99)
Potassium: 3.7 mmol/L (ref 3.5–5.1)
Sodium: 140 mmol/L (ref 135–145)

## 2022-07-09 MED ORDER — MELATONIN 5 MG PO TABS
10.0000 mg | ORAL_TABLET | Freq: Every day | ORAL | Status: DC
  Administered 2022-07-09 – 2022-07-18 (×10): 10 mg via ORAL
  Filled 2022-07-09 (×10): qty 2

## 2022-07-09 NOTE — TOC Progression Note (Signed)
Transition of Care [facility]) - Progression Note    Patient Details  Name: GENELDA ROARK MRN: [id number] Date of Birth: [birth year]  [facility] of [facility]) CM/SW Contact  Darleene Cleaver, [place] Phone Number: 07/09/2022, 2:35 PM  Clinical Narrative:     CSW spoke to Thayer Ohm, APS legal guardian to get an update from [facility].  Per [name], she will try to contact [facility] for an update on the assessment.   Expected Discharge Plan: Long Term Nursing Home Barriers to Discharge: No SNF bed  Expected Discharge Plan and Services       Living arrangements for the past 2 months: Single Family Home                 DME Arranged: Walker rolling DME Agency: [employer] Date DME Agency Contacted: 04/25/22 Time DME Agency Contacted: [phone] Representative spoke with at DME Agency: [name]             Social Determinants of Health (SDOH) Interventions SDOH Screenings   Food Insecurity: Patient Unable To Answer (04/23/2022)  Housing: Low Risk  (04/23/2022)  Transportation Needs: No Transportation Needs (04/23/2022)  Utilities: Not At Risk (04/23/2022)  Alcohol Screen: Low Risk  (05/21/2021)  Depression (PHQ2-9): Low Risk  (05/21/2021)  Financial Resource Strain: Low Risk  (05/21/2021)  Physical Activity: Inactive (05/21/2021)  Social Connections: Moderately Integrated (05/21/2021)  Stress: No Stress Concern Present (05/21/2021)  Tobacco Use: Low Risk  (04/23/2022)    Readmission Risk Interventions     No data to display

## 2022-07-09 NOTE — Plan of Care (Signed)
  Problem: Education: Goal: Knowledge of General Education information will improve Description Including pain rating scale, medication(s)/side effects and non-pharmacologic comfort measures Outcome: Progressing   Problem: Health Behavior/Discharge Planning: Goal: Ability to manage health-related needs will improve Outcome: Progressing   Problem: Clinical Measurements: Goal: Ability to maintain clinical measurements within normal limits will improve Outcome: Progressing   Problem: Activity: Goal: Risk for activity intolerance will decrease Outcome: Progressing   Problem: Coping: Goal: Level of anxiety will decrease Outcome: Progressing   Problem: Safety: Goal: Ability to remain free from injury will improve Outcome: Progressing   Problem: Skin Integrity: Goal: Risk for impaired skin integrity will decrease Outcome: Progressing   

## 2022-07-09 NOTE — Plan of Care (Signed)

## 2022-07-09 NOTE — TOC Progression Note (Signed)
Transition of Care [facility]) - Progression Note    Patient Details  Name: Madeline Taylor MRN: [id number] Date of Birth: 1935/11/20  [facility] Care [facility]) CM/SW Contact  Darleene Cleaver, [place] Phone Number: 07/08/2022 2:34pm  Clinical Narrative:    [facility], [facility] can not [facility] patient.  [facility] to evaluate patient again.   Expected Discharge Plan: Long Term Nursing Home Barriers to Discharge: No SNF bed  Expected Discharge Plan and Services       Living arrangements for the past 2 months: Single Family Home                 DME Arranged: Walker rolling DME Agency: [employer] Date DME Agency Contacted: 04/25/22 Time DME Agency Contacted: [phone] Representative spoke with at DME Agency: [name]             Social Determinants of Health (SDOH) Interventions SDOH Screenings   Food Insecurity: Patient Unable To Answer (04/23/2022)  Housing: Low Risk  (04/23/2022)  Transportation Needs: No Transportation Needs (04/23/2022)  Utilities: Not At Risk (04/23/2022)  Alcohol Screen: Low Risk  (05/21/2021)  Depression (PHQ2-9): Low Risk  (05/21/2021)  Financial Resource Strain: Low Risk  (05/21/2021)  Physical Activity: Inactive (05/21/2021)  Social Connections: Moderately Integrated (05/21/2021)  Stress: No Stress Concern Present (05/21/2021)  Tobacco Use: Low Risk  (04/23/2022)    Readmission Risk Interventions     No data to display

## 2022-07-09 NOTE — Progress Notes (Signed)
Progress Note   Patient: Madeline Taylor ZOX:[id number] DOB: 1935/04/04 DOA: 04/22/2022     77 DOS: the patient was seen and examined on 07/09/2022      Subjective:  Patient seen and examined at bedside Did have issues of insomnia overnight I have added melatonin to patient's medications Denies nausea vomiting abdominal pain or chest pain Patient still awaiting guardianship before placement  Brief hospital course: As per HPI - Taylie DEJAH DROESSLER is a 87 y.o. female with medical history significant of dementia, hypertension, history of TIAs presenting with encephalopathy, hypothermia.  Limited history in the setting of encephalopathy.  History primarily from her daughter.  Per report, patient was noted to be living on the poor living conditions as her husband recently went into a nursing facility.  Patient was found to be confused as well as living in substandard conditions.  The daughter states that she has been relatively estranged from her mom for several years.  However, has been trying to take care of her for the past 1 to 2 months.  Patient had recurring episodes of confusion, agitation at home.  Was recently evaluated by a primary care physician.  This was her first visit in approximately 2 years.  Was started on medication for blood pressure per the daughter.  No reports of fevers or chills.  No reports of nausea or vomiting.  No reports of chest pain or shortness of breath.  Noted worsening agitation and confusion.    During her prolonged hospital stay, no specific etiology found for her mental status change, had a fall 05/04/22 with left frontal bruising, left eye black. She developed fever of 102 on 4/27 blood cultures positive for E.coli treated with antibiotics. She remained hemodynamically stable, though on and off confused, agitated mostly at nights, requiring a sitter for safety.    Assessment and Plan: Vascular dementia: w/ hx of TIA.  Remains on and off agitated mostly at nights due to  sundowning. Continue Seroquel, Risperdal. Sitter for safety. Dispo challenging. CM is looking for placement at memory care facility.   E.coli bacteremia: Completed abx course. Remains afebrile. No sepsis.   Pancreatic pseudocyst: seen on CT scan of the abdomen. Continue w/ supportive care    HTN: continue on losartan, amlodipine    Hypothermia: resolved   Hyponatremia: resolved    Head trauma s/p fall, facial bruising: improved    Metabolic acidosis: resolved        Physical Exam:   General exam: In no obvious distress HEENT: moist mucus membranes, hearing grossly normal  Respiratory system: on room air, normal respiratory effort. Cardiovascular system: RRR, no pedal edema.   Central nervous system: no gross focal neurologic deficits, normal speech Extremities: moves all, no edema, normal tone Skin: dry, intact, normal temperature Psychiatry: normal mood, congruent affect       Data Reviewed: No new labs today.     Family Communication: None present. No medical updates.   Per Dr. Clide Dales 6/15: "Discussed with patient's husband of 35 yrs yesterday who wants her to come home. But told him that they can't be dependant on each other now due to her current condition. He understands."   Disposition: Status is: Inpatient Remains inpatient appropriate because: safe disposition, challenging dispo plan.    Planned Discharge Destination: Skilled nursing facility          Vitals:   07/09/22 0049 07/09/22 0712 07/09/22 1322 07/09/22 1532  BP: (!) 146/70 (!) 154/85 (!) 167/81 (!) 162/80  Pulse: 66  74 94 86  Resp: 18 17 18 18   Temp: 97.9 F (36.6 C) 97.9 F (36.6 C) 98.9 F (37.2 C) 98 F (36.7 C)  TempSrc:      SpO2: 98% 100% 100% 98%  Weight:      Height:        Author: Loyce Dys, MD 07/09/2022 5:13 PM  For on call review [email or web address].

## 2022-07-09 NOTE — Progress Notes (Signed)
   07/09/22 1000  Spiritual Encounters  Type of Visit Follow up  Care provided to: Patient  Conversation partners present during encounter Nurse  Referral source Nurse (RN/NT/LPN)  Reason for visit Routine spiritual support  OnCall Visit No  Spiritual Framework  Presenting Themes Rituals and practive;Community and relationships  Community/Connection Family;Significant other;Faith community  Strengths Faith, Family  Patient Stress Factors Not reviewed  Interventions  Spiritual Care Interventions Made Prayer  Spiritual Care Plan  Spiritual Care Issues Still Outstanding No further spiritual care needs at this time (see row info)   Was stop by one of the Medical staff to check on Madeline Taylor. While in patient room she want me to pray for her and the nurse caregiver that was in the room. Patient was excited because her husband was coming for a visit today. Patient is a firm believer in Christianity and also the Nurse that was present.

## 2022-07-10 DIAGNOSIS — F01C18 Vascular dementia, severe, with other behavioral disturbance: Secondary | ICD-10-CM | POA: Diagnosis not present

## 2022-07-10 NOTE — Progress Notes (Signed)
Mobility Specialist - Progress Note   07/10/22 0913  Mobility  Activity Ambulated independently in hallway  Level of Assistance Independent  Assistive Device None  Distance Ambulated (ft) 350 ft  Activity Response Tolerated well  Mobility Referral Yes  $Mobility charge 1 Mobility  Mobility Specialist Start Time (ACUTE ONLY) 0816  Mobility Specialist Stop Time (ACUTE ONLY) 0827  Mobility Specialist Time Calculation (min) (ACUTE ONLY) 11 min   Pt OOB on RA upon arrival. Pt ambulates in hallway indep with no LOB noted. Pt returns to EOB with needs in reach and sitter present.   Terrilyn Saver  Mobility Specialist  07/10/22 9:14 AM

## 2022-07-10 NOTE — Progress Notes (Signed)
Progress Note   Patient: Madeline Taylor ZOX:[id number] DOB: 1935/02/16 DOA: 04/22/2022     78 DOS: the patient was seen and examined on 07/10/2022     Subjective:  Patient seen and examined Still awaiting guardianship and placement Denies any overnight events   Brief hospital course: As per HPI - Madeline Taylor is a 87 y.o. female with medical history significant of dementia, hypertension, history of TIAs presenting with encephalopathy, hypothermia.  Limited history in the setting of encephalopathy.  History primarily from her daughter.  Per report, patient was noted to be living on the poor living conditions as her husband recently went into a nursing facility.  Patient was found to be confused as well as living in substandard conditions.  The daughter states that she has been relatively estranged from her mom for several years.  However, has been trying to take care of her for the past 1 to 2 months.  Patient had recurring episodes of confusion, agitation at home.  Was recently evaluated by a primary care physician.  This was her first visit in approximately 2 years.  Was started on medication for blood pressure per the daughter.  No reports of fevers or chills.  No reports of nausea or vomiting.  No reports of chest pain or shortness of breath.  Noted worsening agitation and confusion.    During her prolonged hospital stay, no specific etiology found for her mental status change, had a fall 05/04/22 with left frontal bruising, left eye black. She developed fever of 102 on 4/27 blood cultures positive for E.coli treated with antibiotics. She remained hemodynamically stable, though on and off confused, agitated mostly at nights, requiring a sitter for safety.    Assessment and Plan: Vascular dementia: w/ hx of TIA.  Remains on and off agitated mostly at nights due to sundowning. Continue Seroquel, Risperdal. Sitter for safety. Dispo challenging. CM is looking for placement at memory care  facility.   E.coli bacteremia: Completed abx course. Remains afebrile. No sepsis.   Pancreatic pseudocyst: seen on CT scan of the abdomen. Continue w/ supportive care    HTN: continue on losartan, amlodipine    Hypothermia: resolved   Hyponatremia: resolved    Head trauma s/p fall, facial bruising: improved    Metabolic acidosis: resolved        Physical Exam:   General exam: In no obvious distress HEENT: moist mucus membranes, hearing grossly normal  Respiratory system: on room air, normal respiratory effort. Cardiovascular system: RRR, no pedal edema.   Central nervous system: no gross focal neurologic deficits, normal speech Extremities: moves all, no edema, normal tone Skin: dry, intact, normal temperature Psychiatry: normal mood, congruent affect       Data Reviewed: No labs for review today     Family Communication: None present. No medical updates.   Per Dr. Clide Dales 6/15: "Discussed with patient's husband of 35 yrs yesterday who wants her to come home. But told him that they can't be dependant on each other now due to her current condition. He understands."   Disposition: Status is: Inpatient Remains inpatient appropriate because: safe disposition, challenging dispo plan.    Planned Discharge Destination: Skilled nursing facility       Vitals:   07/09/22 1322 07/09/22 1532 07/10/22 0826 07/10/22 1605  BP: (!) 167/81 (!) 162/80 (!) 154/76 130/71  Pulse: 94 86 68 70  Resp: 18 18  17   Temp: 98.9 F (37.2 C) 98 F (36.7 C)  98.7 F (37.1  C)  TempSrc:      SpO2: 100% 98% 98%   Weight:      Height:         Author: Loyce Dys, MD 07/10/2022 5:30 PM  For on call review [email or web address].

## 2022-07-11 DIAGNOSIS — F01C18 Vascular dementia, severe, with other behavioral disturbance: Secondary | ICD-10-CM | POA: Diagnosis not present

## 2022-07-11 NOTE — Progress Notes (Signed)
Mobility Specialist - Progress Note   07/11/22 0841  Mobility  Activity Ambulated independently in hallway  Level of Assistance Independent  Assistive Device None  Distance Ambulated (ft) 340 ft  Activity Response Tolerated well  Mobility Referral Yes  $Mobility charge 1 Mobility  Mobility Specialist Start Time (ACUTE ONLY) 0816  Mobility Specialist Stop Time (ACUTE ONLY) [id number]  Mobility Specialist Time Calculation (min) (ACUTE ONLY) 7 min   Pt sitting in chair on RA upon arrival. Pt STS and ambulates in hallway indep with no LOB noted. Pt returns to chair with needs in reach and sitter present.   Terrilyn Saver  Mobility Specialist  07/11/22 8:42 AM

## 2022-07-11 NOTE — Progress Notes (Signed)
Progress Note   Patient: Madeline Taylor ION:[id number] DOB: 1935/02/02 DOA: 04/22/2022     79 DOS: the patient was seen and examined on 07/11/2022   Subjective:  Seen and examined at bedside this morning Still continues to await a legal guardianship Still requiring one-to-one sitter because of risks of elopement   Brief hospital course: As per HPI - Madeline Taylor is a 87 y.o. female with medical history significant of dementia, hypertension, history of TIAs presenting with encephalopathy, hypothermia.  Limited history in the setting of encephalopathy.  History primarily from her daughter.  Per report, patient was noted to be living on the poor living conditions as her husband recently went into a nursing facility.  Patient was found to be confused as well as living in substandard conditions.  The daughter states that she has been relatively estranged from her mom for several years.  However, has been trying to take care of her for the past 1 to 2 months.  Patient had recurring episodes of confusion, agitation at home.  Was recently evaluated by a primary care physician.  This was her first visit in approximately 2 years.  Was started on medication for blood pressure per the daughter.  No reports of fevers or chills.  No reports of nausea or vomiting.  No reports of chest pain or shortness of breath.  Noted worsening agitation and confusion.    During her prolonged hospital stay, no specific etiology found for her mental status change, had a fall 05/04/22 with left frontal bruising, left eye black. She developed fever of 102 on 4/27 blood cultures positive for E.coli treated with antibiotics. She remained hemodynamically stable, though on and off confused, agitated mostly at nights, requiring a sitter for safety.    Assessment and Plan: Vascular dementia: w/ hx of TIA.  Remains on and off agitated mostly at nights due to sundowning. Continue Seroquel, Risperdal. Sitter for safety. Dispo  challenging. CM is looking for placement at memory care facility.   E.coli bacteremia: Completed abx course. Remains afebrile. No sepsis.   Pancreatic pseudocyst: seen on CT scan of the abdomen. Continue w/ supportive care    HTN: continue on losartan, amlodipine    Hypothermia: resolved   Hyponatremia: resolved    Head trauma s/p fall, facial bruising: Resolved   Metabolic acidosis: resolved        Physical Exam:   General exam: In no obvious distress HEENT: moist mucus membranes, hearing grossly normal  Respiratory system: on room air, normal respiratory effort. Cardiovascular system: RRR, no pedal edema.   Central nervous system: no gross focal neurologic deficits, normal speech Extremities: moves all, no edema, normal tone Skin: dry, intact, normal temperature Psychiatry: normal mood, congruent affect       Data Reviewed: No labs for review today     Family Communication: Husband present at bedside   Disposition: Status is: Inpatient Remains inpatient appropriate because: safe disposition, challenging dispo plan.  Patient awaiting legal guardianship    Planned Discharge Destination: Skilled nursing facility        Vitals:   07/09/22 1532 07/10/22 0826 07/10/22 1605 07/11/22 0914  BP: (!) 162/80 (!) 154/76 130/71 139/79  Pulse: 86 68 70 77  Resp: 18  17 18   Temp: 98 F (36.7 C)  98.7 F (37.1 C)   TempSrc:      SpO2: 98% 98%  100%  Weight:      Height:        Author: Criss Alvine T  Meriam Sprague, MD 07/11/2022 3:06 PM  For on call review [email or web address].

## 2022-07-12 DIAGNOSIS — F01C18 Vascular dementia, severe, with other behavioral disturbance: Secondary | ICD-10-CM | POA: Diagnosis not present

## 2022-07-12 NOTE — Progress Notes (Signed)
Patient sat on the floor. Patient did not fall, patient put herself on the floor and decided to sit.  Patient sat on the floor for ~10 minutes before getting up on her own.

## 2022-07-12 NOTE — Progress Notes (Signed)
Medications given earlier than scheduled, patient is getting agitated and trying to hit staff with the chair she is pushing, pushing the door close to not let the sitter out of the hallway. Redirect patient and reoriented back to room.

## 2022-07-12 NOTE — Progress Notes (Signed)
Progress Note   Patient: Madeline Taylor:[id number] DOB: 1935-02-25 DOA: 04/22/2022     80 DOS: the patient was seen and examined on 07/12/2022     Subjective:  Patient seen and examined at bedside this morning Denies any complaints at this time Transition of care working on placement   Brief hospital course: As per HPI - Madeline Taylor is a 87 y.o. female with medical history significant of dementia, hypertension, history of TIAs presenting with encephalopathy, hypothermia.  Limited history in the setting of encephalopathy.  History primarily from her daughter.  Per report, patient was noted to be living on the poor living conditions as her husband recently went into a nursing facility.  Patient was found to be confused as well as living in substandard conditions.  The daughter states that she has been relatively estranged from her mom for several years.  However, has been trying to take care of her for the past 1 to 2 months.  Patient had recurring episodes of confusion, agitation at home.  Was recently evaluated by a primary care physician.  This was her first visit in approximately 2 years.  Was started on medication for blood pressure per the daughter.  No reports of fevers or chills.  No reports of nausea or vomiting.  No reports of chest pain or shortness of breath.  Noted worsening agitation and confusion.    During her prolonged hospital stay, no specific etiology found for her mental status change, had a fall 05/04/22 with left frontal bruising, left eye black. She developed fever of 102 on 4/27 blood cultures positive for E.coli treated with antibiotics. She remained hemodynamically stable, though on and off confused, agitated mostly at nights, requiring a sitter for safety.    Assessment and Plan: Vascular dementia: w/ hx of TIA.  Remains on and off agitated mostly at nights due to sundowning. Continue Seroquel, Risperdal. Sitter for safety. Dispo challenging. CM is looking for  placement at memory care facility.   E.coli bacteremia: Completed abx course. Remains afebrile. No sepsis.   Pancreatic pseudocyst: seen on CT scan of the abdomen. Continue w/ supportive care    HTN: continue on losartan, amlodipine    Hypothermia: resolved   Hyponatremia: resolved    Head trauma s/p fall, facial bruising: Resolved   Metabolic acidosis: resolved        Physical Exam:   General exam: In no obvious distress HEENT: moist mucus membranes, hearing grossly normal  Respiratory system: on room air, normal respiratory effort. Cardiovascular system: RRR, no pedal edema.   Central nervous system: no gross focal neurologic deficits, normal speech Extremities: moves all, no edema, normal tone Skin: dry, intact, normal temperature Psychiatry: normal mood, congruent affect       Data Reviewed: No labs available today for review     Family Communication: Husband present at bedside   Disposition: Status is: Inpatient Remains inpatient appropriate because: safe disposition, challenging dispo plan.  Patient still awaiting legal guardianship   Planned Discharge Destination: Skilled nursing facility      Vitals:   07/11/22 0914 07/12/22 0522 07/12/22 0726 07/12/22 1448  BP: 139/79 (!) 146/70 130/63 138/73  Pulse: 77 74 60 89  Resp: 18 15 17 19   Temp:  98.3 F (36.8 C) 98.2 F (36.8 C) 98 F (36.7 C)  TempSrc:   Oral   SpO2: 100% 98% 96% 98%  Weight:      Height:         Author: Criss Alvine  Gus Puma, MD 07/12/2022 6:07 PM  For on call review [email or web address].

## 2022-07-12 NOTE — Plan of Care (Signed)

## 2022-07-13 DIAGNOSIS — F01C18 Vascular dementia, severe, with other behavioral disturbance: Secondary | ICD-10-CM | POA: Diagnosis not present

## 2022-07-13 NOTE — Plan of Care (Signed)

## 2022-07-13 NOTE — TOC Progression Note (Signed)
Transition of Care [facility]) - Progression Note    Patient Details  Name: Madeline Taylor MRN: [id number] Date of Birth: 08-12-1935  [facility]) CM/SW Contact  Darleene Cleaver, [place] Phone Number: 07/13/2022, 11:56 AM  Clinical Narrative:     CSW attempted to contact [facility] to get an update on when they have evaluated patient, and if they can accept her.  CSW attempted to leave a message, per voice mail, it is unable to accept any more voice mails.  Expected Discharge Plan: Long Term Nursing Home Barriers to Discharge: No SNF bed  Expected Discharge Plan and Services       Living arrangements for the past 2 months: Single Family Home                 DME Arranged: Walker rolling DME Agency: [employer] Date DME Agency Contacted: 04/25/22 Time DME Agency Contacted: [phone] Representative spoke with at DME Agency: [name]             Social Determinants of Health (SDOH) Interventions SDOH Screenings   Food Insecurity: Patient Unable To Answer (04/23/2022)  Housing: Low Risk  (04/23/2022)  Transportation Needs: No Transportation Needs (04/23/2022)  Utilities: Not At Risk (04/23/2022)  Alcohol Screen: Low Risk  (05/21/2021)  Depression (PHQ2-9): Low Risk  (05/21/2021)  Financial Resource Strain: Low Risk  (05/21/2021)  Physical Activity: Inactive (05/21/2021)  Social Connections: Moderately Integrated (05/21/2021)  Stress: No Stress Concern Present (05/21/2021)  Tobacco Use: Low Risk  (04/23/2022)    Readmission Risk Interventions     No data to display

## 2022-07-13 NOTE — Progress Notes (Signed)
Progress Note   Patient: Madeline Taylor:[id number] DOB: August 04, 1935 DOA: 04/22/2022     81 DOS: the patient was seen and examined on 07/13/2022      Subjective:  Patient seen and examined this morning Denies nausea vomiting abdominal pain Still awaiting placement   Brief hospital course: As per HPI - Madeline Taylor is a 87 y.o. female with medical history significant of dementia, hypertension, history of TIAs presenting with encephalopathy, hypothermia.  Limited history in the setting of encephalopathy.  History primarily from her daughter.  Per report, patient was noted to be living on the poor living conditions as her husband recently went into a nursing facility.  Patient was found to be confused as well as living in substandard conditions.  The daughter states that she has been relatively estranged from her mom for several years.  However, has been trying to take care of her for the past 1 to 2 months.  Patient had recurring episodes of confusion, agitation at home.  Was recently evaluated by a primary care physician.  This was her first visit in approximately 2 years.  Was started on medication for blood pressure per the daughter.  No reports of fevers or chills.  No reports of nausea or vomiting.  No reports of chest pain or shortness of breath.  Noted worsening agitation and confusion.    During her prolonged hospital stay, no specific etiology found for her mental status change, had a fall 05/04/22 with left frontal bruising, left eye black. She developed fever of 102 on 4/27 blood cultures positive for E.coli treated with antibiotics. She remained hemodynamically stable, though on and off confused, agitated mostly at nights, requiring a sitter for safety.    Assessment and Plan: Vascular dementia: w/ hx of TIA.  Remains on and off agitated mostly at nights due to sundowning. Continue Seroquel, Risperdal. Sitter for safety. Dispo challenging. CM is looking for placement at memory care  facility.   E.coli bacteremia: Completed abx course. Remains afebrile. No sepsis.   Pancreatic pseudocyst: seen on CT scan of the abdomen. Continue w/ supportive care    HTN: continue on losartan, amlodipine    Hypothermia: resolved   Hyponatremia: resolved    Head trauma s/p fall, facial bruising: Resolved   Metabolic acidosis: resolved        Physical Exam:   General exam: In no obvious distress HEENT: moist mucus membranes, hearing grossly normal  Respiratory system: on room air, normal respiratory effort. Cardiovascular system: RRR, no pedal edema.   Central nervous system: no gross focal neurologic deficits, normal speech Extremities: moves all, no edema, normal tone Skin: dry, intact, normal temperature Psychiatry: normal mood, congruent affect       Data Reviewed: No labs available today for review     Family Communication: Husband present at bedside   Disposition: Status is: Inpatient Remains inpatient appropriate because: safe disposition, challenging dispo plan.  Still pending placement   Planned Discharge Destination: Skilled nursing facility        Vitals:   07/12/22 0726 07/12/22 1448 07/12/22 2308 07/13/22 0711  BP: 130/63 138/73 (!) 151/72 136/74  Pulse: 60 89 67 64  Resp: 17 19 18 17   Temp: 98.2 F (36.8 C) 98 F (36.7 C) (!) 97.4 F (36.3 C) 97.7 F (36.5 C)  TempSrc: Oral   Oral  SpO2: 96% 98% 97% 98%  Weight:      Height:         Author: Criss Alvine T  Meriam Sprague, MD 07/13/2022 4:06 PM  For on call review [email or web address].

## 2022-07-14 DIAGNOSIS — K863 Pseudocyst of pancreas: Secondary | ICD-10-CM | POA: Diagnosis not present

## 2022-07-14 DIAGNOSIS — F01A11 Vascular dementia, mild, with agitation: Secondary | ICD-10-CM | POA: Diagnosis not present

## 2022-07-14 DIAGNOSIS — R7881 Bacteremia: Secondary | ICD-10-CM | POA: Diagnosis not present

## 2022-07-14 DIAGNOSIS — B9620 Unspecified Escherichia coli [E. coli] as the cause of diseases classified elsewhere: Secondary | ICD-10-CM | POA: Diagnosis not present

## 2022-07-14 NOTE — Plan of Care (Signed)

## 2022-07-14 NOTE — Progress Notes (Signed)
Progress Note   Patient: Madeline Taylor ZOX:[id number] DOB: 08-02-1935 DOA: 04/22/2022     82 DOS: the patient was seen and examined on 07/14/2022   Brief hospital course: 87 y.o. female with medical history significant of dementia, hypertension, history of TIAs presenting with encephalopathy, hypothermia  Per daughter, patient had worsening dementia, frequently Lost walking out of the house.  But normally has good appetite.  For the last week, patient had significant increased agitation, has not been sleeping well. She also had a positive COVID about a week ago, repeat COVID was negative at admission. Patient is seen by PT/OT, no need for nursing home placement.  However unsafe discharge.  The patient has vascular dementia and does not have the ability to make medical decisions or sign paperwork for herself at this time. Evening of 4/16, patient had a fall out of bed and has bruising left frontal and around the left eye. 4/27 in the evening spiked a fever of 102.  Blood cultures positive for E. coli and Started empirically on antibiotics and IV fluids.   4/29.  CT scan shows a pancreatic pseudocyst.  Tumor markers negative.  Patient medically stable to go out to a facility but we do not have a place. DSS is legal guardian now.    Principal Problem:   Vascular dementia (HCC) Active Problems:   Bacteremia due to Escherichia coli   Pancreatic pseudocyst   Essential hypertension   History of TIA (transient ischemic attack)   Hypothermia   Osteoarthritis of multiple joints   Metabolic acidosis   Dementia with behavioral disturbance (HCC)   Facial bruising, sequela   Hyponatremia   Severe dementia (HCC)   Altered mental status   E. coli septicemia (HCC)   Assessment and Plan: Vascular dementia: w/ hx of TIA.  Continue respite all as needed and Seroquel at night.  Still need a sitter.   E.coli bacteremia due to UTI.: Completed abx course. Remains afebrile. No sepsis.   Pancreatic  pseudocyst: seen on CT scan of the abdomen. Continue w/ supportive care    HTN: continue on losartan, amlodipine    Hypothermia: resolved   Hyponatremia: resolved    Head trauma s/p fall, facial bruising: Resolved   Metabolic acidosis: resolved   No new changes in treatment plan.  Pending nursing home placement.    Subjective:  Patient has no complaint today.  Physical Exam: Vitals:   07/12/22 2308 07/13/22 0711 07/13/22 2105 07/14/22 0844  BP:  136/74 (!) 154/77 (!) 134/59  Pulse: 67 64 73 77  Resp: 18 17 16 18   Temp: (!) 97.4 F (36.3 C) 97.7 F (36.5 C) 97.7 F (36.5 C) 98 F (36.7 C)  TempSrc:  Oral  Oral  SpO2: 97% 98% 100% 100%  Weight:      Height:       General exam: Appears calm and comfortable  Respiratory system: Clear to auscultation. Respiratory effort normal. Cardiovascular system: S1 & S2 heard, RRR. No JVD, murmurs, rubs, gallops or clicks. No pedal edema. Gastrointestinal system: Abdomen is nondistended, soft and nontender. No organomegaly or masses felt. Normal bowel sounds heard. Central nervous system: Alert and oriented x2. No focal neurological deficits. Extremities: Symmetric 5 x 5 power. Skin: No rashes, lesions or ulcers Psychiatry: Mood & affect appropriate.    Data Reviewed:  There are no new results to review at this time.  Family Communication: None  Disposition: Status is: Inpatient Remains inpatient appropriate because: Unsafe discharge option.  Time spent: 25 minutes  Author: Marrion Coy, MD 07/14/2022 1:07 PM  For on call review [email or web address].

## 2022-07-14 NOTE — Progress Notes (Addendum)
Mobility Specialist - Progress Note   07/14/22 0957  Mobility  Activity Ambulated independently in hallway  Level of Assistance Independent  Assistive Device None  Distance Ambulated (ft) 500 ft  Activity Response Tolerated well  Mobility Referral Yes  $Mobility charge 1 Mobility  Mobility Specialist Start Time (ACUTE ONLY) 0931  Mobility Specialist Stop Time (ACUTE ONLY) 0944  Mobility Specialist Time Calculation (min) (ACUTE ONLY) 13 min   Pt sitting in recliner on RA upon arrival. Pt STS and ambulates in hallway indep. Pt returns to recliner with needs in reach and sitter present.   Terrilyn Saver  Mobility Specialist  07/14/22 9:58 AM

## 2022-07-14 NOTE — TOC Progression Note (Signed)
Transition of Care [facility]) - Progression Note    Patient Details  Name: Madeline Taylor MRN: [id number] Date of Birth: 22-Mar-1935  Transition of Care [facility]) CM/SW Contact  Darleene Cleaver, [place] Phone Number: 07/14/2022, 5:36 PM  Clinical Narrative:     CSW spoke to Adan Sis, ED, [phone], at [facility].  She states she does have some memory care beds available.  Referral emailed to [facility] to review patient's information.  TOC awaiting on a decision if they can accept patient.  Expected Discharge Plan: Long Term Nursing Home Barriers to Discharge: No SNF bed  Expected Discharge Plan and Services       Living arrangements for the past 2 months: Single Family Home                 DME Arranged: Walker rolling DME Agency: [employer] Date DME Agency Contacted: 04/25/22 Time DME Agency Contacted: [phone] Representative spoke with at DME Agency: [name]             Social Determinants of Health (SDOH) Interventions SDOH Screenings   Food Insecurity: Patient Unable To Answer (04/23/2022)  Housing: Low Risk  (04/23/2022)  Transportation Needs: No Transportation Needs (04/23/2022)  Utilities: Not At Risk (04/23/2022)  Alcohol Screen: Low Risk  (05/21/2021)  Depression (PHQ2-9): Low Risk  (05/21/2021)  Financial Resource Strain: Low Risk  (05/21/2021)  Physical Activity: Inactive (05/21/2021)  Social Connections: Moderately Integrated (05/21/2021)  Stress: No Stress Concern Present (05/21/2021)  Tobacco Use: Low Risk  (04/23/2022)    Readmission Risk Interventions     No data to display

## 2022-07-15 DIAGNOSIS — I10 Essential (primary) hypertension: Secondary | ICD-10-CM | POA: Diagnosis not present

## 2022-07-15 DIAGNOSIS — F01A11 Vascular dementia, mild, with agitation: Secondary | ICD-10-CM | POA: Diagnosis not present

## 2022-07-15 NOTE — Plan of Care (Signed)

## 2022-07-15 NOTE — Progress Notes (Signed)
Progress Note   Patient: Madeline Taylor WUJ:[id number] DOB: 12/12/1935 DOA: 04/22/2022     85 DOS: the patient was seen and examined on 07/15/2022   Brief hospital course: 87 y.o. female with medical history significant of dementia, hypertension, history of TIAs presenting with encephalopathy, hypothermia  Per daughter, patient had worsening dementia, frequently Lost walking out of the house.  But normally has good appetite.  For the last week, patient had significant increased agitation, has not been sleeping well. She also had a positive COVID about a week ago, repeat COVID was negative at admission. Patient is seen by PT/OT, no need for nursing home placement.  However unsafe discharge.  The patient has vascular dementia and does not have the ability to make medical decisions or sign paperwork for herself at this time. Evening of 4/16, patient had a fall out of bed and has bruising left frontal and around the left eye. 4/27 in the evening spiked a fever of 102.  Blood cultures positive for E. coli and Started empirically on antibiotics and IV fluids.   4/29.  CT scan shows a pancreatic pseudocyst.  Tumor markers negative.  Patient medically stable to go out to a facility but we do not have a place. DSS is legal guardian now.    Principal Problem:   Vascular dementia (HCC) Active Problems:   Bacteremia due to Escherichia coli   Pancreatic pseudocyst   Essential hypertension   History of TIA (transient ischemic attack)   Hypothermia   Osteoarthritis of multiple joints   Metabolic acidosis   Dementia with behavioral disturbance (HCC)   Facial bruising, sequela   Hyponatremia   Severe dementia (HCC)   Altered mental status   Assessment and Plan:  Vascular dementia: w/ hx of TIA.  Continue respite all as needed and Seroquel at night.  Still need a sitter.   E.coli bacteremia due to UTI.: Completed abx course. Remains afebrile. No sepsis.   Pancreatic pseudocyst: seen on CT scan  of the abdomen. Continue w/ supportive care    HTN: continue on losartan, amlodipine    Hypothermia: resolved   Hyponatremia: resolved    Head trauma s/p fall, facial bruising: Resolved   Metabolic acidosis: resolved   Patient doing well today, complaining some mild pain in her lower back and bilateral hips.  But was able to walk without difficulty.  Continue as needed Tylenol.    Subjective: Complaining of mild pain in the lower back and bilateral hips.  Physical Exam: Vitals:   07/14/22 0844 07/14/22 1629 07/14/22 2318 07/15/22 0745  BP: (!) 134/59 (!) 168/84 (!) 156/78 (!) 171/64  Pulse: 77 80 78 64  Resp: 18 18 18 18   Temp: 98 F (36.7 C)  98.2 F (36.8 C) 97.8 F (36.6 C)  TempSrc: Oral     SpO2: 100% 100% 100% 100%  Weight:      Height:       General exam: Appears calm and comfortable  Respiratory system: Clear to auscultation. Respiratory effort normal. Cardiovascular system: S1 & S2 heard, RRR. No JVD, murmurs, rubs, gallops or clicks. No pedal edema. Gastrointestinal system: Abdomen is nondistended, soft and nontender. No organomegaly or masses felt. Normal bowel sounds heard. Central nervous system: Alert and oriented x2. No focal neurological deficits. Extremities: Symmetric 5 x 5 power. Skin: No rashes, lesions or ulcers Psychiatry: Judgement and insight appear normal. Mood & affect appropriate.    Data Reviewed:  There are no new results to review at this  time.  Family Communication: None  Disposition: Status is: Inpatient Remains inpatient appropriate because: Unsafe discharge option.     Time spent: 25 minutes  Author: Marrion Coy, MD 07/15/2022 11:02 AM  For on call review [email or web address].

## 2022-07-16 DIAGNOSIS — I10 Essential (primary) hypertension: Secondary | ICD-10-CM | POA: Diagnosis not present

## 2022-07-16 DIAGNOSIS — F03918 Unspecified dementia, unspecified severity, with other behavioral disturbance: Secondary | ICD-10-CM | POA: Diagnosis not present

## 2022-07-16 DIAGNOSIS — Z8673 Personal history of transient ischemic attack (TIA), and cerebral infarction without residual deficits: Secondary | ICD-10-CM | POA: Diagnosis not present

## 2022-07-16 NOTE — TOC Progression Note (Addendum)
Transition of Care [facility]) - Progression Note    Patient Details  Name: Madeline Taylor MRN: [id number] Date of Birth: Apr 03, 1935  Transition of Care [facility]) CM/SW Contact  Darleene Cleaver, [place] Phone Number: 07/16/2022, 2:15pm  Clinical Narrative:     CSW received email from Wilber Oliphant, DSS legal guardian.  She said patient has a bed at [facility] in the memory care unit, and the facility can accept her Monday.  Per DSS they will pick her up around 10:15am.  CSW sent message to charge nurse and physician to see if they could get a PPD completed so it can be read prior to her discharge.  TOC to continue to follow patient's progress throughout discharge planning.  Expected Discharge Plan: Long Term Nursing Home Barriers to Discharge: No SNF bed  Expected Discharge Plan and Services       Living arrangements for the past 2 months: Single Family Home                 DME Arranged: Walker rolling DME Agency: [employer] Date DME Agency Contacted: 04/25/22 Time DME Agency Contacted: [phone] Representative spoke with at DME Agency: [name]             Social Determinants of Health (SDOH) Interventions SDOH Screenings   Food Insecurity: Patient Unable To Answer (04/23/2022)  Housing: Low Risk  (04/23/2022)  Transportation Needs: No Transportation Needs (04/23/2022)  Utilities: Not At Risk (04/23/2022)  Alcohol Screen: Low Risk  (05/21/2021)  Depression (PHQ2-9): Low Risk  (05/21/2021)  Financial Resource Strain: Low Risk  (05/21/2021)  Physical Activity: Inactive (05/21/2021)  Social Connections: Moderately Integrated (05/21/2021)  Stress: No Stress Concern Present (05/21/2021)  Tobacco Use: Low Risk  (04/23/2022)    Readmission Risk Interventions     No data to display

## 2022-07-16 NOTE — Progress Notes (Signed)
Progress Note   Patient: Madeline Taylor ZOX:[id number] DOB: 04-17-1935 DOA: 04/22/2022     84 DOS: the patient was seen and examined on 07/16/2022   Brief hospital course: 87 y.o. female with medical history significant of dementia, hypertension, history of TIAs presenting with encephalopathy, hypothermia  Per daughter, patient had worsening dementia, frequently Lost walking out of the house.  But normally has good appetite.  For the last week, patient had significant increased agitation, has not been sleeping well. She also had a positive COVID about a week ago, repeat COVID was negative at admission. Patient is seen by PT/OT, no need for nursing home placement.  However unsafe discharge.  The patient has vascular dementia and does not have the ability to make medical decisions or sign paperwork for herself at this time. Evening of 4/16, patient had a fall out of bed and has bruising left frontal and around the left eye. 4/27 in the evening spiked a fever of 102.  Blood cultures positive for E. coli and Started empirically on antibiotics and IV fluids.   4/29.  CT scan shows a pancreatic pseudocyst.  Tumor markers negative.  Patient medically stable to go out to a facility but we do not have a place. DSS is legal guardian now.    Principal Problem:   Vascular dementia (HCC) Active Problems:   Bacteremia due to Escherichia coli   Pancreatic pseudocyst   Essential hypertension   History of TIA (transient ischemic attack)   Hypothermia   Osteoarthritis of multiple joints   Metabolic acidosis   Dementia with behavioral disturbance (HCC)   Facial bruising, sequela   Hyponatremia   Severe dementia (HCC)   Altered mental status   Assessment and Plan: Vascular dementia: w/ hx of TIA.  Continue respite all as needed and Seroquel at night.  Still need a sitter.   E.coli bacteremia due to UTI.: Completed abx course. Remains afebrile. No sepsis.   Pancreatic pseudocyst: seen on CT scan of  the abdomen. Continue w/ supportive care    HTN: continue on losartan, amlodipine    Hypothermia: resolved   Hyponatremia: resolved    Head trauma s/p fall, facial bruising: Resolved   Metabolic acidosis: resolved   Still has no discharge options, she was able to walk better today.  Pain is better.    Subjective: No complaint today.  Physical Exam: Vitals:   07/15/22 0745 07/15/22 1534 07/15/22 2300 07/16/22 0814  BP: (!) 171/64 (!) 158/75 (!) 162/90 (!) 150/73  Pulse: 64 76 67 (!) 58  Resp: 18 18 16 18   Temp: 97.8 F (36.6 C)  97.7 F (36.5 C) 98 F (36.7 C)  TempSrc:   Oral Oral  SpO2: 100% 100% 99% 99%  Weight:      Height:       General exam: Appears calm and comfortable  Respiratory system: Clear to auscultation. Respiratory effort normal. Cardiovascular system: S1 & S2 heard, RRR. No JVD, murmurs, rubs, gallops or clicks. No pedal edema. Gastrointestinal system: Abdomen is nondistended, soft and nontender. No organomegaly or masses felt. Normal bowel sounds heard. Central nervous system: Alert and oriented x2. No focal neurological deficits. Extremities: Symmetric 5 x 5 power. Skin: No rashes, lesions or ulcers Psychiatry: Mood & affect appropriate.    Data Reviewed:  There are no new results to review at this time.  Family Communication: None  Disposition: Status is: Inpatient Remains inpatient appropriate because: Unsafe discharge.     Time spent: 25 minutes  Author: Marrion Coy, MD 07/16/2022 1:04 PM  For on call review [email or web address].

## 2022-07-17 DIAGNOSIS — F03918 Unspecified dementia, unspecified severity, with other behavioral disturbance: Secondary | ICD-10-CM | POA: Diagnosis not present

## 2022-07-17 DIAGNOSIS — I10 Essential (primary) hypertension: Secondary | ICD-10-CM | POA: Diagnosis not present

## 2022-07-17 DIAGNOSIS — M159 Polyosteoarthritis, unspecified: Secondary | ICD-10-CM | POA: Diagnosis not present

## 2022-07-17 MED ORDER — TUBERCULIN PPD 5 UNIT/0.1ML ID SOLN
5.0000 [IU] | Freq: Once | INTRADERMAL | Status: AC
  Administered 2022-07-17: 5 [IU] via INTRADERMAL
  Filled 2022-07-17: qty 0.1

## 2022-07-17 NOTE — Progress Notes (Signed)
PPD done at 0814 on the right forearm.

## 2022-07-17 NOTE — Progress Notes (Signed)
Progress Note   Patient: Madeline Taylor:[id number] DOB: 06/09/1935 DOA: 04/22/2022     85 DOS: the patient was seen and examined on 07/17/2022   Brief hospital course: 87 y.o. female with medical history significant of dementia, hypertension, history of TIAs presenting with encephalopathy, hypothermia  Per daughter, patient had worsening dementia, frequently Lost walking out of the house.  But normally has good appetite.  For the last week, patient had significant increased agitation, has not been sleeping well. She also had a positive COVID about a week ago, repeat COVID was negative at admission. Patient is seen by PT/OT, no need for nursing home placement.  However unsafe discharge.  The patient has vascular dementia and does not have the ability to make medical decisions or sign paperwork for herself at this time. Evening of 4/16, patient had a fall out of bed and has bruising left frontal and around the left eye. 4/27 in the evening spiked a fever of 102.  Blood cultures positive for E. coli and Started empirically on antibiotics and IV fluids.   4/29.  CT scan shows a pancreatic pseudocyst.  Tumor markers negative.  Patient medically stable to go out to a facility but we do not have a place. DSS is legal guardian now.    Principal Problem:   Vascular dementia (HCC) Active Problems:   Bacteremia due to Escherichia coli   Pancreatic pseudocyst   Essential hypertension   History of TIA (transient ischemic attack)   Hypothermia   Osteoarthritis of multiple joints   Metabolic acidosis   Dementia with behavioral disturbance (HCC)   Facial bruising, sequela   Hyponatremia   Severe dementia (HCC)   Altered mental status   Assessment and Plan:  Vascular dementia: w/ hx of TIA.  Continue respite all as needed and Seroquel at night.  Still need a sitter.   E.coli bacteremia due to UTI.: Completed abx course. Remains afebrile. No sepsis.   Pancreatic pseudocyst: seen on CT scan  of the abdomen. Continue w/ supportive care    HTN: continue on losartan, amlodipine    Hypothermia: resolved   Hyponatremia: resolved    Head trauma s/p fall, facial bruising: Resolved   Metabolic acidosis: resolved    She currently has no complaint.  No change in treatment plan.  Discussed with TOC, most likely patient will be transferred to facility on Monday.    Subjective: No complaint.  Physical Exam: Vitals:   07/15/22 2300 07/16/22 0814 07/16/22 1612 07/17/22 0903  BP: (!) 162/90 (!) 150/73 (!) 160/73 120/65  Pulse: 67 (!) 58 76 70  Resp: 16 18 18 18   Temp: 97.7 F (36.5 C) 98 F (36.7 C) 98.2 F (36.8 C) 98 F (36.7 C)  TempSrc: Oral Oral Oral   SpO2: 99% 99% 100% 100%  Weight:      Height:       General exam: Appears calm and comfortable  Respiratory system: Clear to auscultation. Respiratory effort normal. Cardiovascular system: S1 & S2 heard, RRR. No JVD, murmurs, rubs, gallops or clicks. No pedal edema. Gastrointestinal system: Abdomen is nondistended, soft and nontender. No organomegaly or masses felt. Normal bowel sounds heard. Central nervous system: Alert and oriented x2. No focal neurological deficits. Extremities: Symmetric 5 x 5 power. Skin: No rashes, lesions or ulcers Psychiatry: Judgement and insight appear normal. Mood & affect appropriate.    Data Reviewed:  There are no new results to review at this time.  Family Communication: None  Disposition: Status  is: Inpatient Remains inpatient appropriate because: Unsafe discharge option.     Time spent: 25 minutes  Author: Marrion Coy, MD 07/17/2022 10:24 AM  For on call review [email or web address].

## 2022-07-18 DIAGNOSIS — I10 Essential (primary) hypertension: Secondary | ICD-10-CM | POA: Diagnosis not present

## 2022-07-18 DIAGNOSIS — F03918 Unspecified dementia, unspecified severity, with other behavioral disturbance: Secondary | ICD-10-CM | POA: Diagnosis not present

## 2022-07-18 NOTE — Progress Notes (Signed)
Progress Note   Patient: Madeline Taylor DOB: 1935/11/05 DOA: 04/22/2022     85 DOS: the patient was seen and examined on 07/18/2022   Brief hospital course: 87 y.o. female with medical history significant of dementia, hypertension, history of TIAs presenting with encephalopathy, hypothermia  Per daughter, patient had worsening dementia, frequently Lost walking out of the house.  But normally has good appetite.  For the last week, patient had significant increased agitation, has not been sleeping well. She also had a positive COVID about a week ago, repeat COVID was negative at admission. Patient is seen by PT/OT, no need for nursing home placement.  However unsafe discharge.  The patient has vascular dementia and does not have the ability to make medical decisions or sign paperwork for herself at this time. Evening of 4/16, patient had a fall out of bed and has bruising left frontal and around the left eye. 4/27 in the evening spiked a fever of 102.  Blood cultures positive for E. coli and Started empirically on antibiotics and IV fluids.   4/29.  CT scan shows a pancreatic pseudocyst.  Tumor markers negative.  Patient medically stable to go out to a facility but we do not have a place. DSS is legal guardian now.    Principal Problem:   Vascular dementia (HCC) Active Problems:   Bacteremia due to Escherichia coli   Pancreatic pseudocyst   Essential hypertension   History of TIA (transient ischemic attack)   Hypothermia   Osteoarthritis of multiple joints   Metabolic acidosis   Dementia with behavioral disturbance (HCC)   Facial bruising, sequela   Hyponatremia   Severe dementia (HCC)   Altered mental status   Assessment and Plan: Vascular dementia: w/ hx of TIA.  Continue respite all as needed and Seroquel at night.  Still need a sitter.   E.coli bacteremia due to UTI.: Completed abx course. Remains afebrile. No sepsis.   Pancreatic pseudocyst: seen on CT scan of  the abdomen. Continue w/ supportive care    HTN: continue on losartan, amlodipine    Hypothermia: resolved   Hyponatremia: resolved    Head trauma s/p fall, facial bruising: Resolved   Metabolic acidosis: resolved     PPD placed yesterday, pending transfer to facility tomorrow.  No new issues.    Subjective: Patient had no complaint today.  Physical Exam: Vitals:   07/16/22 1612 07/17/22 0903 07/17/22 1645 07/18/22 0932  BP: (!) 160/73 120/65 136/67 (!) 148/71  Pulse: 76 70 86 85  Resp: 18 18 18 16   Temp: 98.2 F (36.8 C) 98 F (36.7 C) 99.1 F (37.3 C) 98.2 F (36.8 C)  TempSrc: Oral     SpO2: 100% 100% 99% 99%  Weight:      Height:       General exam: Appears calm and comfortable  Respiratory system: Clear to auscultation. Respiratory effort normal. Cardiovascular system: S1 & S2 heard, RRR. No JVD, murmurs, rubs, gallops or clicks. No pedal edema. Gastrointestinal system: Abdomen is nondistended, soft and nontender. No organomegaly or masses felt. Normal bowel sounds heard. Central nervous system: Alert and oriented x2. No focal neurological deficits. Extremities: Symmetric 5 x 5 power. Skin: No rashes, lesions or ulcers Psychiatry: Judgement and insight appear normal. Mood & affect appropriate.    Data Reviewed:  There are no new results to review at this time.  Family Communication: None  Disposition: Status is: Inpatient Remains inpatient appropriate because: Unsafe discharge  Time spent: 25 minutes  Author: Marrion Coy, MD 07/18/2022 11:38 AM  For on call review [email or web address].

## 2022-07-18 NOTE — Plan of Care (Signed)

## 2022-07-19 DIAGNOSIS — B9620 Unspecified Escherichia coli [E. coli] as the cause of diseases classified elsewhere: Secondary | ICD-10-CM | POA: Diagnosis not present

## 2022-07-19 DIAGNOSIS — R7881 Bacteremia: Secondary | ICD-10-CM | POA: Diagnosis not present

## 2022-07-19 DIAGNOSIS — M159 Polyosteoarthritis, unspecified: Secondary | ICD-10-CM | POA: Diagnosis not present

## 2022-07-19 DIAGNOSIS — F03918 Unspecified dementia, unspecified severity, with other behavioral disturbance: Secondary | ICD-10-CM | POA: Diagnosis not present

## 2022-07-19 MED ORDER — QUETIAPINE FUMARATE 25 MG PO TABS: 25.0000 mg | ORAL_TABLET | Freq: Every day | ORAL | Status: AC

## 2022-07-19 MED ORDER — MELATONIN 10 MG PO TABS: 10.0000 mg | ORAL_TABLET | Freq: Every day | ORAL | 0 refills | Status: AC

## 2022-07-19 NOTE — Progress Notes (Signed)
Mobility Specialist - Progress Note  07/19/22 1015  Mobility  Activity Ambulated independently in room (chair level exercise)  Level of Assistance Independent  Assistive Device None  Distance Ambulated (ft) 40 ft  Range of Motion/Exercises Active  Activity Response Tolerated well  $Mobility charge 1 Mobility  Mobility Specialist Start Time (ACUTE ONLY) 0950  Mobility Specialist Stop Time (ACUTE ONLY) 1011  Mobility Specialist Time Calculation (min) (ACUTE ONLY) 21 min   Pt amb in room upon entry, utilizing RA. Pt expressed soreness in hip. Pt completed BLE chair level exercises indep including; seated marches, single leg extensions and abductor squeezes. Pt left seated in chair with needs within reach. Sitter present.   Zetta Bills Mobility Specialist 07/19/22 10:18 AM

## 2022-07-19 NOTE — Care Management Important Message (Signed)
Important Message  Patient Details  Name: Madeline Taylor MRN: [id number] Date of Birth: [birth year]   Medicare Important Message Given:  Yes  CSW communicating with Legal Venda Rodes [phone] with [facility] DSS (different contact than what is listed in demographic section). She confirmed she the patient' legal guardian and is in agreement with the discharge. She will be here shortly to transport patient to [facility]. I thanked her for time.  I called Nancy Fetter [phone] back to let her know to disregard my previous voice mail as I confirmed that Madeline Taylor was now this patients legal guardian and I updated contact information in EPIC.     Olegario Messier A Merranda Bolls 07/19/2022, 9:39 AM

## 2022-07-19 NOTE — [place] FL2 (Signed)
[place] MEDICAID FL2 LEVEL OF CARE FORM     IDENTIFICATION  Patient Name: Madeline Taylor Birthdate: [birth year] Sex: female Admission Date (Current Location): 04/22/2022  [employer] and [place] Number:  [address] [id number] Q Pending Facility and Address:  [facility], [address], [place], [place] [address]      Provider Number: [id number]  Attending Physician Name and Address:  Marrion Coy, MD  Relative Name and Phone Number:  Nancy Fetter from [facility] DSS is legal guardian [phone]    Current Level of Care: Hospital Recommended Level of Care: Skilled Nursing Facility, Memory Care Prior Approval Number:    Date Approved/Denied:   PASRR Number: [id number]  Discharge Plan: Domiciliary (Rest home) (Memory Care ALF)    Current Diagnoses: Patient Active Problem List   Diagnosis Date Noted   Severe dementia (HCC)    06/17/2022   Altered Mental Status 06/23/2022   Pancreatic pseudocyst 05/18/2022   Bacteremia due to Escherichia coli 05/16/2022   Hyponatremia 05/16/2022   Facial bruising, sequela 05/12/2022   Metabolic acidosis 04/24/2022   Dementia with behavioral disturbance (HCC) 04/24/2022   Hypothermia 04/23/2022   Mixed incontinence 02/12/2021   Aortic atherosclerosis (HCC) 11/17/2020   Vascular dementia (HCC) 11/17/2020   History of TIA (transient ischemic attack) 11/17/2020   Osteoarthritis of multiple joints 05/18/2018   Hyperlipidemia 07/29/2016   Essential hypertension 07/29/2016   Osteoporosis of femur without pathological fracture 07/29/2016    Orientation RESPIRATION BLADDER Height & Weight     Self  Normal Continent Weight: 108 lb 14.5 oz (49.4 kg) Height:  4\' 11"  (149.9 cm)  BEHAVIORAL SYMPTOMS/MOOD NEUROLOGICAL BOWEL NUTRITION STATUS  Wanderer   Continent Diet  AMBULATORY STATUS COMMUNICATION OF NEEDS Skin   Supervision Verbally Normal                       Personal Care Assistance Level of Assistance   Bathing, Feeding, Dressing Bathing Assistance: Limited assistance Feeding assistance: Independent Dressing Assistance: Limited assistance     Functional Limitations Info  Sight, Speech, Hearing Sight Info: Adequate Hearing Info: Adequate Speech Info: Adequate    SPECIAL CARE FACTORS FREQUENCY                       Contractures Contractures Info: Not present    Additional Factors Info  Code Status, Allergies Code Status Info: Full Code Allergies Info: Lisinopril  Statins Psychotropic Info: OLANZapine (ZYPREXA) tablet 5 mg, risperiDONE (RISPERDAL) tablet 1 mg         Current Medications (07/19/2022):  This is the current hospital active medication list Current Facility-Administered Medications  Medication Dose Route Frequency Provider Last Rate Last Admin   acetaminophen (TYLENOL) tablet 650 mg  650 mg Oral Q6H PRN Alford Highland, MD   650 mg at 07/16/22 1731   alum & mag hydroxide-simeth (MAALOX/MYLANTA) 200-200-20 MG/5ML suspension 30 mL  30 mL Oral Q6H PRN Sharman Cheek, MD       amLODipine (NORVASC) tablet 5 mg  5 mg Oral Daily Marrion Coy, MD   5 mg at 07/19/22 0902   losartan (COZAAR) tablet 50 mg  50 mg Oral Daily Marrion Coy, MD   50 mg at 07/19/22 0902   melatonin tablet 10 mg  10 mg Oral QHS Loyce Dys, MD   10 mg at 07/18/22 2100   Muscle Rub CREA   Topical PRN Floydene Flock, MD   Given at 07/11/22 1720   ondansetron (  ZOFRAN) tablet 4 mg  4 mg Oral Q6H PRN Floydene Flock, MD       Or   ondansetron [facility]) injection 4 mg  4 mg Intravenous Q6H PRN Floydene Flock, MD       Oral care mouth rinse  15 mL Mouth Rinse PRN Alford Highland, MD       QUEtiapine (SEROQUEL) tablet 25 mg  25 mg Oral QHS Marrion Coy, MD   25 mg at 07/18/22 2100   risperiDONE (RISPERDAL) tablet 1 mg  1 mg Oral Q12H PRN Alford Highland, MD   1 mg at 07/17/22 1609   senna-docusate (Senokot-S) tablet 2 tablet  2 tablet Oral BID Marrion Coy, MD   2 tablet at 07/19/22 0902      Discharge Medications: STOP taking these medications     aspirin EC 81 MG tablet           TAKE these medications     acetaminophen 325 MG tablet Commonly known as: Tylenol Take 2 tablets (650 mg total) by mouth every 6 (six) hours as needed for moderate pain.    amLODipine-olmesartan 10-40 MG tablet Commonly known as: AZOR Take 1 tablet by mouth daily.    Melatonin 10 MG Tabs Take 10 mg by mouth at bedtime.    QUEtiapine 25 MG tablet Commonly known as: SEROQUEL Take 1 tablet (25 mg total) by mouth at bedtime.    Relevant Imaging Results:  Relevant Lab Results:   Additional Information SSN [id number]  Darleene Cleaver, LCSW

## 2022-07-19 NOTE — TOC Progression Note (Signed)
Transition of Care [facility]) - Progression Note    Patient Details  Name: Madeline Taylor MRN: [id number] Date of Birth: 01-May-1935  [facility]) CM/SW Contact  Marlowe Sax, RN Phone Number: 07/19/2022, 8:43 AM  Clinical Narrative:    Reached out to [facility] and spoke to [employer], they stated that they were not aware that the patient was coming today but recommended that I call Elesa Massed, I called [facility] and asked for Victorino Dike, she is not in, I asked for Joyce Gross the memory care coordinator and she is off today, Will try Victorino Dike again, I sent the DC summary to [facility] thru the Hub   Expected Discharge Plan: Long Term Nursing Home Barriers to Discharge: No SNF bed  Expected Discharge Plan and Services       Living arrangements for the past 2 months: Single Family Home Expected Discharge Date: 07/19/22               DME Arranged: [name] rolling DME Agency: [employer] Date DME Agency Contacted: 04/25/22 Time DME Agency Contacted: [phone] Representative spoke with at DME Agency: [name]             Social Determinants of Health (SDOH) Interventions SDOH Screenings   Food Insecurity: Patient Unable To Answer (04/23/2022)  Housing: Low Risk  (04/23/2022)  Transportation Needs: No Transportation Needs (04/23/2022)  Utilities: Not At Risk (04/23/2022)  Alcohol Screen: Low Risk  (05/21/2021)  Depression (PHQ2-9): Low Risk  (05/21/2021)  Financial Resource Strain: Low Risk  (05/21/2021)  Physical Activity: Inactive (05/21/2021)  Social Connections: Moderately Integrated (05/21/2021)  Stress: No Stress Concern Present (05/21/2021)  Tobacco Use: Low Risk  (04/23/2022)    Readmission Risk Interventions     No data to display

## 2022-07-19 NOTE — Progress Notes (Signed)
PPD was read at 8:15 this morning, the skin was not raised, or hardened. MD notified. Patient chart updated.

## 2022-07-19 NOTE — Care Management Important Message (Signed)
Important Message  Patient Details  Name: DARLA BLIGH MRN: [id number] Date of Birth: July 25, 1935   Medicare Important Message Given:  Other (see comment)  Left a message for the patient's Legal Trixie Deis [phone]) to review the Important Message from Medicare with her. Will await a return call.    Olegario Messier A Riyad Keena 07/19/2022, 9:34 AM

## 2022-07-19 NOTE — TOC Transition Note (Signed)
Transition of Care [facility]) - CM/SW Discharge Note   Patient Details  Name: Madeline Taylor MRN: [id number] Date of Birth: 05-21-1935  [facility] of [facility] CM/SW Contact:  Darleene Cleaver, LCSW Phone Number: 07/19/2022, 11:33 AM   Clinical Narrative:     CSW received email from Thayer Ohm regarding patient discharging today.  Per [name] patient will be discharged to [facility] today at 10:15am.  DSS will transport patient to facility via car.  CSW emailed copy of the discharge summary, FL2, and PPD test results to [name] at DSS.  Patient will be discharged today, CSW signing off.   Final next level of care: Assisted Living Barriers to Discharge: Barriers Resolved   Patient Goals and CMS Choice CMS Medicare.gov Compare Post Acute Care list provided to:: Legal Guardian ([name] Manson Passey DSS legal guardian) Choice offered to / list presented to : [facility] POA / Guardian  Discharge Placement                Patient chooses bed at: Other - please specify in the comment section below: ([facility] memory care.) Patient to be transferred to facility by: DSS legal guardian Name of family member notified: Thayer Ohm, DSS legal guardian Patient and family notified of of transfer: 07/19/22  Discharge Plan and Services Additional resources added to the After Visit Summary for                  DME Arranged: Walker rolling DME Agency: [employer] Date DME Agency Contacted: 04/25/22 Time DME Agency Contacted: [phone] Representative spoke with at DME Agency: [name]            Social Determinants of Health (SDOH) Interventions SDOH Screenings   Food Insecurity: Patient Unable To Answer (04/23/2022)  Housing: Low Risk  (04/23/2022)  Transportation Needs: No Transportation Needs (04/23/2022)  Utilities: Not At Risk (04/23/2022)  Alcohol Screen: Low Risk  (05/21/2021)  Depression (PHQ2-9): Low Risk  (05/21/2021)  Financial Resource Strain: Low Risk  (05/21/2021)  Physical Activity:  Inactive (05/21/2021)  Social Connections: Moderately Integrated (05/21/2021)  Stress: No Stress Concern Present (05/21/2021)  Tobacco Use: Low Risk  (04/23/2022)     Readmission Risk Interventions     No data to display

## 2022-07-19 NOTE — Plan of Care (Signed)

## 2022-07-19 NOTE — Discharge Summary (Signed)
Physician Discharge Summary   Patient: Madeline Taylor MRN: [id number] DOB: [birth year]  Admit date:     04/22/2022  Discharge date: 07/19/22  Discharge Physician: Marrion Coy   PCP: [name], NP   Recommendations at discharge:    Follow with PCP in 1 week  Discharge Diagnoses: Principal Problem:   Vascular dementia (HCC) Active Problems:   Bacteremia due to Escherichia coli   Pancreatic pseudocyst   Essential hypertension   History of TIA (transient ischemic attack)   Hypothermia   Osteoarthritis of multiple joints   Metabolic acidosis   Dementia with behavioral disturbance (HCC)   Facial bruising, sequela   Hyponatremia   Severe dementia (HCC)   Altered mental status  Resolved Problems:   * No resolved hospital problems. *  Hospital Course: 87 y.o. female with medical history significant of dementia, hypertension, history of TIAs presenting with encephalopathy, hypothermia  Per daughter, patient had worsening dementia, frequently Lost walking out of the house.  But normally has good appetite.  For the last week, patient had significant increased agitation, has not been sleeping well. She also had a positive COVID about a week ago, repeat COVID was negative at admission. Patient is seen by PT/OT, no need for nursing home placement.  However unsafe discharge.  The patient has vascular dementia and does not have the ability to make medical decisions or sign paperwork for herself at this time. Evening of 4/16, patient had a fall out of bed and has bruising left frontal and around the left eye. 4/27 in the evening spiked a fever of 102.  Blood cultures positive for E. coli and Started empirically on antibiotics and IV fluids.   4/29.  CT scan shows a pancreatic pseudocyst.  Tumor markers negative.  DSS is legal guardian now. Patient is finally able to be placed.    Assessment and Plan:  Vascular dementia: w/ hx of TIA.  Continue Seroquel.    E.coli bacteremia due to  UTI.: Completed abx course. Remains afebrile. No sepsis.   Pancreatic pseudocyst: seen on CT scan of the abdomen. Continue w/ supportive care    HTN: continue on losartan, amlodipine    Hypothermia: resolved   Hyponatremia: resolved    Head trauma s/p fall, facial bruising: Resolved   Metabolic acidosis: resolved       Consultants: None Procedures performed: None  Disposition: Skilled nursing facility Diet recommendation:  Discharge Diet Orders (From admission, onward)     Start     Ordered   07/19/22 0000  Diet - low sodium heart healthy        07/19/22 0800           Cardiac diet DISCHARGE MEDICATION: Allergies as of 07/19/2022       Reactions   Lisinopril Cough   Statins         Medication List     STOP taking these medications    aspirin EC 81 MG tablet       TAKE these medications    acetaminophen 325 MG tablet Commonly known as: Tylenol Take 2 tablets (650 mg total) by mouth every 6 (six) hours as needed for moderate pain.   amLODipine-olmesartan 10-40 MG tablet Commonly known as: AZOR Take 1 tablet by mouth daily.   Melatonin 10 MG Tabs Take 10 mg by mouth at bedtime.   QUEtiapine 25 MG tablet Commonly known as: SEROQUEL Take 1 tablet (25 mg total) by mouth at bedtime.  Durable Medical Equipment  (From admission, onward)           Start     Ordered   04/25/22 1002  For home use only DME 4 wheeled rolling walker with seat  Once       Comments: Front wheel  Question:  Patient needs a walker to treat with the following condition  Answer:  Weakness   04/25/22 1002            Follow-up Information     [name], NP Follow up in 1 week(s).   Specialties: Internal Medicine, Emergency Medicine Contact information: [address] [place] [address] [phone]                Discharge Exam: Ceasar Mons Weights   04/22/22 1757  Weight: 49.4 kg   General exam: Appears calm and comfortable   Respiratory system: Clear to auscultation. Respiratory effort normal. Cardiovascular system: S1 & S2 heard, RRR. No JVD, murmurs, rubs, gallops or clicks. No pedal edema. Gastrointestinal system: Abdomen is nondistended, soft and nontender. No organomegaly or masses felt. Normal bowel sounds heard. Central nervous system: Alert and oriented x2. No focal neurological deficits. Extremities: Symmetric 5 x 5 power. Skin: No rashes, lesions or ulcers Psychiatry: Judgement and insight appear normal. Mood & affect appropriate.    Condition at discharge: good  The results of significant diagnostics from this hospitalization (including imaging, microbiology, ancillary and laboratory) are listed below for reference.   Imaging Studies: No results found.  Microbiology: Results for orders placed or performed during the hospital encounter of 04/22/22  MRSA culture     Status: None   Collection Time: 04/23/22 12:06 PM   Specimen: Nasal; Body Fluid  Result Value Ref Range Status   Specimen Description   Final    NASAL SWAB Performed at [facility], [address] [place], [place] [address]    Special Requests   Final    NONE Performed at [facility], [address] [place], [place] [address]    Culture   Final    NO MRSA DETECTED Performed at [facility], [address], [place], [place] [address]    Report Status 04/25/2022 FINAL  Final  Culture, blood (Routine X 2) w Reflex to ID Panel     Status: None   Collection Time: 04/23/22  2:13 PM   Specimen: BLOOD  Result Value Ref Range Status   Specimen Description BLOOD RIGHT ANTECUBITAL  Final   Special Requests   Final    BOTTLES DRAWN AEROBIC AND ANAEROBIC Blood Culture adequate volume   Culture   Final    NO GROWTH 5 DAYS Performed at [facility], [address], [place], [place] [address]    Report Status 04/28/2022 FINAL  Final  Culture, blood (Routine X 2) w Reflex to ID Panel     Status:  None   Collection Time: 04/23/22  2:22 PM   Specimen: BLOOD  Result Value Ref Range Status   Specimen Description BLOOD BLOOD RIGHT HAND  Final   Special Requests   Final    BOTTLES DRAWN AEROBIC ONLY Blood Culture adequate volume   Culture   Final    NO GROWTH 5 DAYS Performed at [facility], [address] [place], [place] [address]    Report Status 04/28/2022 FINAL  Final  Urine Culture (for pregnant, neutropenic or urologic patients or patients with an indwelling urinary catheter)  Status: None   Collection Time: 04/23/22  4:33 PM   Specimen: Urine, Clean Catch  Result Value Ref Range Status   Specimen Description   Final    URINE, CLEAN CATCH Performed at [facility], [address], [place], [place] [address]    Special Requests   Final    NONE Performed at [facility], [address] [place], [place] [address]    Culture   Final    NO GROWTH Performed at [facility], [address], [place], [place] [address]    Report Status 04/24/2022 FINAL  Final  Respiratory (~20 pathogens) panel by PCR     Status: None   Collection Time: 04/23/22 10:55 PM   Specimen: Nasopharyngeal Swab; Respiratory  Result Value Ref Range Status   Adenovirus NOT DETECTED NOT DETECTED Final   Coronavirus 229E NOT DETECTED NOT DETECTED Final    Comment: (NOTE) The Coronavirus on the Respiratory Panel, DOES NOT test for the novel  Coronavirus (2019 nCoV)    Coronavirus HKU1 NOT DETECTED NOT DETECTED Final   Coronavirus NL63 NOT DETECTED NOT DETECTED Final   Coronavirus OC43 NOT DETECTED NOT DETECTED Final   Metapneumovirus NOT DETECTED NOT DETECTED Final   Rhinovirus / Enterovirus NOT DETECTED NOT DETECTED Final   Influenza A NOT DETECTED NOT DETECTED Final   Influenza B NOT DETECTED NOT DETECTED Final   Parainfluenza Virus 1 NOT DETECTED NOT DETECTED Final   Parainfluenza Virus 2 NOT DETECTED NOT DETECTED Final   Parainfluenza Virus 3 NOT DETECTED  NOT DETECTED Final   Parainfluenza Virus 4 NOT DETECTED NOT DETECTED Final   Respiratory Syncytial Virus NOT DETECTED NOT DETECTED Final   Bordetella pertussis NOT DETECTED NOT DETECTED Final   Bordetella Parapertussis NOT DETECTED NOT DETECTED Final   Chlamydophila pneumoniae NOT DETECTED NOT DETECTED Final   Mycoplasma pneumoniae NOT DETECTED NOT DETECTED Final    Comment: Performed at [facility], [address], [place], [place] [address]  SARS Coronavirus 2 by RT PCR (hospital order, performed in [facility] hospital lab) *cepheid single result test* Anterior Nasal Swab     Status: None   Collection Time: 05/15/22  3:32 PM   Specimen: Anterior Nasal Swab  Result Value Ref Range Status   SARS Coronavirus 2 by RT PCR NEGATIVE NEGATIVE Final    Comment: (NOTE) SARS-CoV-2 target nucleic acids are NOT DETECTED.  The SARS-CoV-2 RNA is generally detectable in upper and lower respiratory specimens during the acute phase of infection. The lowest concentration of SARS-CoV-2 viral copies this assay can detect is 250 copies / mL. A negative result does not preclude SARS-CoV-2 infection and should not be used as the sole basis for treatment or other patient management decisions.  A negative result may occur with improper specimen collection / handling, submission of specimen other than nasopharyngeal swab, presence of viral mutation(s) within the areas targeted by this assay, and inadequate number of viral copies (<250 copies / mL). A negative result must be combined with clinical observations, patient history, and epidemiological information.  Fact Sheet for Patients:   RoadLapTop.co.za  Fact Sheet for Healthcare Providers: http://kim-miller.com/  This test is not yet approved or  cleared by the [place] FDA and has been authorized for detection and/or diagnosis of SARS-CoV-2 by FDA under an Emergency Use Authorization (EUA).  This  EUA will remain in effect (meaning this test can be used) for the duration of the COVID-19 declaration under Section 564(b)(1) of  the Act, 21 U.S.C. section 360bbb-3(b)(1), unless the authorization is terminated or revoked sooner.  Performed at [facility], [address] [place], [place] [address]   Respiratory (~20 pathogens) panel by PCR     Status: None   Collection Time: 05/15/22  3:32 PM   Specimen: Nasopharyngeal Swab; Respiratory  Result Value Ref Range Status   Adenovirus NOT DETECTED NOT DETECTED Final   Coronavirus 229E NOT DETECTED NOT DETECTED Final    Comment: (NOTE) The Coronavirus on the Respiratory Panel, DOES NOT test for the novel  Coronavirus (2019 nCoV)    Coronavirus HKU1 NOT DETECTED NOT DETECTED Final   Coronavirus NL63 NOT DETECTED NOT DETECTED Final   Coronavirus OC43 NOT DETECTED NOT DETECTED Final   Metapneumovirus NOT DETECTED NOT DETECTED Final   Rhinovirus / Enterovirus NOT DETECTED NOT DETECTED Final   Influenza A NOT DETECTED NOT DETECTED Final   Influenza B NOT DETECTED NOT DETECTED Final   Parainfluenza Virus 1 NOT DETECTED NOT DETECTED Final   Parainfluenza Virus 2 NOT DETECTED NOT DETECTED Final   Parainfluenza Virus 3 NOT DETECTED NOT DETECTED Final   Parainfluenza Virus 4 NOT DETECTED NOT DETECTED Final   Respiratory Syncytial Virus NOT DETECTED NOT DETECTED Final   Bordetella pertussis NOT DETECTED NOT DETECTED Final   Bordetella Parapertussis NOT DETECTED NOT DETECTED Final   Chlamydophila pneumoniae NOT DETECTED NOT DETECTED Final   Mycoplasma pneumoniae NOT DETECTED NOT DETECTED Final    Comment: Performed at [facility], [address], [place], [place] [address]  Urine Culture     Status: None   Collection Time: 05/15/22  3:33 PM   Specimen: Urine, Random  Result Value Ref Range Status   Specimen Description   Final    URINE, RANDOM Performed at [facility], [address] [place], [place]  [address]    Special Requests URINE, CLEAN CATCH  Final   Culture   Final    NO GROWTH Performed at [facility], [address] [place], [place] [address]    Report Status 05/17/2022 FINAL  Final  Culture, blood (Routine X 2) w Reflex to ID Panel     Status: Abnormal   Collection Time: 05/15/22  4:01 PM   Specimen: BLOOD  Result Value Ref Range Status   Specimen Description   Final    BLOOD LEFT ANTECUBITAL Performed at [facility], [address] [place], [place] [address]    Special Requests   Final    BOTTLES DRAWN AEROBIC AND ANAEROBIC Blood Culture adequate volume Performed at [facility], [address], [place], [place] [address]    Culture  Setup Time   Final    GRAM NEGATIVE RODS IN BOTH AEROBIC AND ANAEROBIC BOTTLES CRITICAL RESULT CALLED TO, READ BACK BY AND VERIFIED WITH: JASON ROBBINS 0257 05/16/22 LFD    Culture (A)  Final    ESCHERICHIA COLI SUSCEPTIBILITIES PERFORMED ON PREVIOUS CULTURE WITHIN THE LAST 5 DAYS. Performed at [facility], [address], [place], [place] [address]    Report Status 05/18/2022 FINAL  Final  Culture, blood (Routine X 2) w Reflex to ID Panel     Status: Abnormal   Collection Time: 05/15/22  4:03 PM   Specimen: BLOOD  Result Value Ref Range Status   Specimen Description   Final    BLOOD RIGHT ANTECUBITAL Performed at [facility], [address], [place], [place] [address]    Special Requests  Final    BOTTLES DRAWN AEROBIC AND ANAEROBIC Blood Culture adequate volume Performed at [facility], [address] [place], [place] [address]    Culture  Setup Time   Final    GRAM NEGATIVE RODS IN BOTH AEROBIC AND ANAEROBIC BOTTLES CRITICAL RESULT CALLED TO, READ BACK BY AND VERIFIED WITH: JASON ROBBINS @ 0257 05/16/22 LFD    Culture ESCHERICHIA COLI (A)  Final   Report Status 05/18/2022 FINAL  Final   Organism ID, Bacteria ESCHERICHIA COLI  Final      Susceptibility    Escherichia coli - MIC*    AMPICILLIN <=2 SENSITIVE Sensitive     CEFEPIME <=0.12 SENSITIVE Sensitive     CEFTAZIDIME <=1 SENSITIVE Sensitive     CEFTRIAXONE <=0.25 SENSITIVE Sensitive     CIPROFLOXACIN <=0.25 SENSITIVE Sensitive     GENTAMICIN <=1 SENSITIVE Sensitive     IMIPENEM <=0.25 SENSITIVE Sensitive     TRIMETH/SULFA <=20 SENSITIVE Sensitive     AMPICILLIN/SULBACTAM <=2 SENSITIVE Sensitive     PIP/TAZO <=4 SENSITIVE Sensitive     * ESCHERICHIA COLI  Blood Culture ID Panel (Reflexed)     Status: Abnormal (Preliminary result)   Collection Time: 05/15/22  4:03 PM  Result Value Ref Range Status   Enterococcus faecalis NOT DETECTED NOT DETECTED Final   Enterococcus Faecium NOT DETECTED NOT DETECTED Final   Listeria monocytogenes NOT DETECTED NOT DETECTED Final   Staphylococcus species NOT DETECTED NOT DETECTED Final   Staphylococcus aureus (BCID) PENDING NOT DETECTED Incomplete   Staphylococcus epidermidis PENDING NOT DETECTED Incomplete   Staphylococcus lugdunensis PENDING NOT DETECTED Incomplete   Streptococcus species NOT DETECTED NOT DETECTED Final   Streptococcus agalactiae NOT DETECTED NOT DETECTED Final   Streptococcus pneumoniae NOT DETECTED NOT DETECTED Final   Streptococcus pyogenes NOT DETECTED NOT DETECTED Final   A.calcoaceticus-baumannii NOT DETECTED NOT DETECTED Final   Bacteroides fragilis NOT DETECTED NOT DETECTED Final   Enterobacterales DETECTED (A) NOT DETECTED Final    Comment: Enterobacterales represent a large order of gram negative bacteria, not a single organism. CRITICAL RESULT CALLED TO, READ BACK BY AND VERIFIED WITH: JASON ROBBINS @ 0257 05/16/22 LFD    Enterobacter cloacae complex NOT DETECTED NOT DETECTED Final   Escherichia coli DETECTED (A) NOT DETECTED Final    Comment: CRITICAL RESULT CALLED TO, READ BACK BY AND VERIFIED WITH: JASON ROBBINS @ 05/16/22 0257 LFD    Klebsiella aerogenes NOT DETECTED NOT DETECTED Final   Klebsiella oxytoca NOT  DETECTED NOT DETECTED Final   Klebsiella pneumoniae NOT DETECTED NOT DETECTED Final   Proteus species NOT DETECTED NOT DETECTED Final   Salmonella species NOT DETECTED NOT DETECTED Final   Serratia marcescens NOT DETECTED NOT DETECTED Final   Haemophilus influenzae NOT DETECTED NOT DETECTED Final   Neisseria meningitidis NOT DETECTED NOT DETECTED Final   Pseudomonas aeruginosa NOT DETECTED NOT DETECTED Final   Stenotrophomonas maltophilia NOT DETECTED NOT DETECTED Final   Candida albicans NOT DETECTED NOT DETECTED Final   Candida auris NOT DETECTED NOT DETECTED Final   Candida glabrata NOT DETECTED NOT DETECTED Final   Candida krusei NOT DETECTED NOT DETECTED Final   Candida parapsilosis NOT DETECTED NOT DETECTED Final   Candida tropicalis NOT DETECTED NOT DETECTED Final   Cryptococcus neoformans/gattii NOT DETECTED NOT DETECTED Final   CTX-M ESBL NOT DETECTED NOT DETECTED Final   Carbapenem resistance IMP NOT DETECTED NOT DETECTED Final   Carbapenem resistance KPC NOT DETECTED NOT DETECTED Final   Carbapenem resistance NDM  NOT DETECTED NOT DETECTED Final   Carbapenem resist OXA 48 LIKE NOT DETECTED NOT DETECTED Final   Carbapenem resistance VIM NOT DETECTED NOT DETECTED Final    Comment: Performed at [facility], [address] [place], [place] [address]    Labs: CBC: No results for input(s): "WBC", "NEUTROABS", "HGB", "HCT", "MCV", "PLT" in the last 168 hours. Basic Metabolic Panel: No results for input(s): "NA", "K", "CL", "CO2", "GLUCOSE", "BUN", "CREATININE", "CALCIUM", "MG", "PHOS" in the last 168 hours. Liver Function Tests: No results for input(s): "AST", "ALT", "ALKPHOS", "BILITOT", "PROT", "ALBUMIN" in the last 168 hours. CBG: No results for input(s): "GLUCAP" in the last 168 hours.  Discharge time spent: greater than 30 minutes.  Signed: Marrion Coy, MD [facility] 07/19/2022

## 2022-07-27 DIAGNOSIS — F5105 Insomnia due to other mental disorder: Secondary | ICD-10-CM | POA: Diagnosis not present

## 2022-07-27 DIAGNOSIS — I700 Atherosclerosis of aorta: Secondary | ICD-10-CM | POA: Diagnosis not present

## 2022-07-27 DIAGNOSIS — I679 Cerebrovascular disease, unspecified: Secondary | ICD-10-CM | POA: Diagnosis not present

## 2022-07-27 DIAGNOSIS — K863 Pseudocyst of pancreas: Secondary | ICD-10-CM | POA: Diagnosis not present

## 2022-07-27 DIAGNOSIS — I10 Essential (primary) hypertension: Secondary | ICD-10-CM | POA: Diagnosis not present

## 2022-07-27 DIAGNOSIS — M159 Polyosteoarthritis, unspecified: Secondary | ICD-10-CM | POA: Diagnosis not present

## 2022-07-27 DIAGNOSIS — R451 Restlessness and agitation: Secondary | ICD-10-CM | POA: Diagnosis not present

## 2022-07-27 DIAGNOSIS — F01518 Vascular dementia, unspecified severity, with other behavioral disturbance: Secondary | ICD-10-CM | POA: Diagnosis not present

## 2022-07-27 DIAGNOSIS — I69815 Cognitive social or emotional deficit following other cerebrovascular disease: Secondary | ICD-10-CM | POA: Diagnosis not present

## 2022-08-10 DIAGNOSIS — R269 Unspecified abnormalities of gait and mobility: Secondary | ICD-10-CM | POA: Diagnosis not present

## 2022-08-10 DIAGNOSIS — F01518 Vascular dementia, unspecified severity, with other behavioral disturbance: Secondary | ICD-10-CM | POA: Diagnosis not present

## 2022-08-10 DIAGNOSIS — M159 Polyosteoarthritis, unspecified: Secondary | ICD-10-CM | POA: Diagnosis not present

## 2022-08-10 DIAGNOSIS — I69815 Cognitive social or emotional deficit following other cerebrovascular disease: Secondary | ICD-10-CM | POA: Diagnosis not present

## 2022-08-10 DIAGNOSIS — I10 Essential (primary) hypertension: Secondary | ICD-10-CM | POA: Diagnosis not present

## 2022-08-10 DIAGNOSIS — I679 Cerebrovascular disease, unspecified: Secondary | ICD-10-CM | POA: Diagnosis not present

## 2022-08-13 DIAGNOSIS — F01511 Vascular dementia, unspecified severity, with agitation: Secondary | ICD-10-CM | POA: Diagnosis not present

## 2022-08-13 DIAGNOSIS — G4700 Insomnia, unspecified: Secondary | ICD-10-CM | POA: Diagnosis not present

## 2022-08-13 DIAGNOSIS — Z9181 History of falling: Secondary | ICD-10-CM | POA: Diagnosis not present

## 2022-08-13 DIAGNOSIS — Z79899 Other long term (current) drug therapy: Secondary | ICD-10-CM | POA: Diagnosis not present

## 2022-08-13 DIAGNOSIS — R32 Unspecified urinary incontinence: Secondary | ICD-10-CM | POA: Diagnosis not present

## 2022-08-13 DIAGNOSIS — I69815 Cognitive social or emotional deficit following other cerebrovascular disease: Secondary | ICD-10-CM | POA: Diagnosis not present

## 2022-08-13 DIAGNOSIS — M159 Polyosteoarthritis, unspecified: Secondary | ICD-10-CM | POA: Diagnosis not present

## 2022-08-13 DIAGNOSIS — F01518 Vascular dementia, unspecified severity, with other behavioral disturbance: Secondary | ICD-10-CM | POA: Diagnosis not present

## 2022-08-13 DIAGNOSIS — I10 Essential (primary) hypertension: Secondary | ICD-10-CM | POA: Diagnosis not present

## 2022-08-13 DIAGNOSIS — I69318 Other symptoms and signs involving cognitive functions following cerebral infarction: Secondary | ICD-10-CM | POA: Diagnosis not present

## 2022-08-13 DIAGNOSIS — Z556 Problems related to health literacy: Secondary | ICD-10-CM | POA: Diagnosis not present

## 2022-08-16 DIAGNOSIS — E559 Vitamin D deficiency, unspecified: Secondary | ICD-10-CM | POA: Diagnosis not present

## 2022-08-16 DIAGNOSIS — Z79899 Other long term (current) drug therapy: Secondary | ICD-10-CM | POA: Diagnosis not present

## 2022-08-18 DIAGNOSIS — I10 Essential (primary) hypertension: Secondary | ICD-10-CM | POA: Diagnosis not present

## 2022-08-18 DIAGNOSIS — I69318 Other symptoms and signs involving cognitive functions following cerebral infarction: Secondary | ICD-10-CM | POA: Diagnosis not present

## 2022-08-18 DIAGNOSIS — I69815 Cognitive social or emotional deficit following other cerebrovascular disease: Secondary | ICD-10-CM | POA: Diagnosis not present

## 2022-08-18 DIAGNOSIS — F01518 Vascular dementia, unspecified severity, with other behavioral disturbance: Secondary | ICD-10-CM | POA: Diagnosis not present

## 2022-08-18 DIAGNOSIS — F01511 Vascular dementia, unspecified severity, with agitation: Secondary | ICD-10-CM | POA: Diagnosis not present

## 2022-08-18 DIAGNOSIS — G4700 Insomnia, unspecified: Secondary | ICD-10-CM | POA: Diagnosis not present

## 2022-08-23 DIAGNOSIS — I69815 Cognitive social or emotional deficit following other cerebrovascular disease: Secondary | ICD-10-CM | POA: Diagnosis not present

## 2022-08-23 DIAGNOSIS — N182 Chronic kidney disease, stage 2 (mild): Secondary | ICD-10-CM | POA: Diagnosis not present

## 2022-08-23 DIAGNOSIS — M25551 Pain in right hip: Secondary | ICD-10-CM | POA: Diagnosis not present

## 2022-08-23 DIAGNOSIS — I10 Essential (primary) hypertension: Secondary | ICD-10-CM | POA: Diagnosis not present

## 2022-08-23 DIAGNOSIS — F01511 Vascular dementia, unspecified severity, with agitation: Secondary | ICD-10-CM | POA: Diagnosis not present

## 2022-08-23 DIAGNOSIS — E539 Vitamin B deficiency, unspecified: Secondary | ICD-10-CM | POA: Diagnosis not present

## 2022-08-23 DIAGNOSIS — G4700 Insomnia, unspecified: Secondary | ICD-10-CM | POA: Diagnosis not present

## 2022-08-23 DIAGNOSIS — E7800 Pure hypercholesterolemia, unspecified: Secondary | ICD-10-CM | POA: Diagnosis not present

## 2022-08-23 DIAGNOSIS — E559 Vitamin D deficiency, unspecified: Secondary | ICD-10-CM | POA: Diagnosis not present

## 2022-08-23 DIAGNOSIS — I69318 Other symptoms and signs involving cognitive functions following cerebral infarction: Secondary | ICD-10-CM | POA: Diagnosis not present

## 2022-08-23 DIAGNOSIS — F01518 Vascular dementia, unspecified severity, with other behavioral disturbance: Secondary | ICD-10-CM | POA: Diagnosis not present

## 2022-08-23 DIAGNOSIS — I129 Hypertensive chronic kidney disease with stage 1 through stage 4 chronic kidney disease, or unspecified chronic kidney disease: Secondary | ICD-10-CM | POA: Diagnosis not present

## 2022-08-23 DIAGNOSIS — M25552 Pain in left hip: Secondary | ICD-10-CM | POA: Diagnosis not present

## 2022-08-25 DIAGNOSIS — F01511 Vascular dementia, unspecified severity, with agitation: Secondary | ICD-10-CM | POA: Diagnosis not present

## 2022-08-25 DIAGNOSIS — G4700 Insomnia, unspecified: Secondary | ICD-10-CM | POA: Diagnosis not present

## 2022-08-25 DIAGNOSIS — I10 Essential (primary) hypertension: Secondary | ICD-10-CM | POA: Diagnosis not present

## 2022-08-25 DIAGNOSIS — I69318 Other symptoms and signs involving cognitive functions following cerebral infarction: Secondary | ICD-10-CM | POA: Diagnosis not present

## 2022-08-25 DIAGNOSIS — I69815 Cognitive social or emotional deficit following other cerebrovascular disease: Secondary | ICD-10-CM | POA: Diagnosis not present

## 2022-08-25 DIAGNOSIS — F01518 Vascular dementia, unspecified severity, with other behavioral disturbance: Secondary | ICD-10-CM | POA: Diagnosis not present

## 2022-08-27 ENCOUNTER — Emergency Department
Admission: EM | Admit: 2022-08-27 | Discharge: 2022-08-31 | Disposition: A | Discharge status: Home / Self Care | Payer: TRICARE For Life (TFL) | Attending: Student in an Organized Health Care Education/Training Program | Admitting: Student in an Organized Health Care Education/Training Program

## 2022-08-27 DIAGNOSIS — F03918 Unspecified dementia, unspecified severity, with other behavioral disturbance: Secondary | ICD-10-CM | POA: Insufficient documentation

## 2022-08-27 DIAGNOSIS — F918 Other conduct disorders: Secondary | ICD-10-CM | POA: Insufficient documentation

## 2022-08-27 DIAGNOSIS — R456 Violent behavior: Secondary | ICD-10-CM | POA: Diagnosis not present

## 2022-08-27 DIAGNOSIS — R410 Disorientation, unspecified: Secondary | ICD-10-CM | POA: Diagnosis not present

## 2022-08-27 DIAGNOSIS — Z008 Encounter for other general examination: Secondary | ICD-10-CM | POA: Insufficient documentation

## 2022-08-27 DIAGNOSIS — Z8616 Personal history of COVID-19: Secondary | ICD-10-CM | POA: Diagnosis not present

## 2022-08-27 DIAGNOSIS — Z9183 Wandering in diseases classified elsewhere: Secondary | ICD-10-CM | POA: Diagnosis not present

## 2022-08-27 DIAGNOSIS — R4689 Other symptoms and signs involving appearance and behavior: Secondary | ICD-10-CM

## 2022-08-27 DIAGNOSIS — I10 Essential (primary) hypertension: Secondary | ICD-10-CM | POA: Diagnosis not present

## 2022-08-27 DIAGNOSIS — R4182 Altered mental status, unspecified: Secondary | ICD-10-CM | POA: Diagnosis not present

## 2022-08-27 LAB — URINALYSIS, ROUTINE W REFLEX MICROSCOPIC
Bilirubin Urine: NEGATIVE
Glucose, UA: NEGATIVE mg/dL
Hgb urine dipstick: NEGATIVE
Ketones, ur: NEGATIVE mg/dL
Nitrite: NEGATIVE
Protein, ur: NEGATIVE mg/dL
Specific Gravity, Urine: 1.013 (ref 1.005–1.030)
pH: 5 (ref 5.0–8.0)

## 2022-08-27 LAB — COMPREHENSIVE METABOLIC PANEL
ALT: 21 U/L (ref 0–44)
AST: 29 U/L (ref 15–41)
Albumin: 4.1 g/dL (ref 3.5–5.0)
Alkaline Phosphatase: 59 U/L (ref 38–126)
Anion gap: 8 (ref 5–15)
BUN: 15 mg/dL (ref 8–23)
CO2: 24 mmol/L (ref 22–32)
Calcium: 9 mg/dL (ref 8.9–10.3)
Chloride: 108 mmol/L (ref 98–111)
Creatinine, Ser: 0.66 mg/dL (ref 0.44–1.00)
GFR, Estimated: >60 mL/min (ref >60)
Glucose, Bld: 97 mg/dL (ref 70–99)
Potassium: 4.1 mmol/L (ref 3.5–5.1)
Sodium: 140 mmol/L (ref 135–145)
Total Bilirubin: 0.9 mg/dL (ref 0.3–1.2)
Total Protein: 7.7 g/dL (ref 6.5–8.1)

## 2022-08-27 LAB — CBC WITH DIFFERENTIAL/PLATELET
Abs Immature Granulocytes: 0.04 10*3/uL (ref 0.00–0.07)
Basophils Absolute: 0 10*3/uL (ref 0.0–0.1)
Basophils Relative: 1 %
Eosinophils Absolute: 0.1 10*3/uL (ref 0.0–0.5)
Eosinophils Relative: 1 %
HCT: 40.2 % (ref 36.0–46.0)
Hemoglobin: 13.3 g/dL (ref 12.0–15.0)
Immature Granulocytes: 1 %
Lymphocytes Relative: 24 %
Lymphs Abs: 1.9 10*3/uL (ref 0.7–4.0)
MCH: 30.3 pg (ref 26.0–34.0)
MCHC: 33.1 g/dL (ref 30.0–36.0)
MCV: 91.6 fL (ref 80.0–100.0)
Monocytes Absolute: 0.5 10*3/uL (ref 0.1–1.0)
Monocytes Relative: 6 %
Neutro Abs: 5.6 10*3/uL (ref 1.7–7.7)
Neutrophils Relative %: 67 %
Platelets: 241 10*3/uL (ref 150–400)
RBC: 4.39 MIL/uL (ref 3.87–5.11)
RDW: 12.1 % (ref 11.5–15.5)
WBC: 8.1 10*3/uL (ref 4.0–10.5)
nRBC: 0 % (ref 0.0–0.2)

## 2022-08-27 MED ORDER — QUETIAPINE FUMARATE 25 MG PO TABS
25.0000 mg | ORAL_TABLET | Freq: Every day | ORAL | Status: DC
  Administered 2022-08-27: 25 mg via ORAL
  Filled 2022-08-27: qty 1

## 2022-08-27 MED ORDER — IRBESARTAN 150 MG PO TABS
300.0000 mg | ORAL_TABLET | Freq: Every day | ORAL | Status: DC
  Administered 2022-08-28 – 2022-08-31 (×4): 300 mg via ORAL
  Filled 2022-08-27 (×4): qty 2

## 2022-08-27 MED ORDER — AMLODIPINE BESYLATE 5 MG PO TABS
10.0000 mg | ORAL_TABLET | Freq: Every day | ORAL | Status: DC
  Administered 2022-08-28 – 2022-08-31 (×4): 10 mg via ORAL
  Filled 2022-08-27 (×4): qty 2

## 2022-08-27 MED ORDER — QUETIAPINE FUMARATE 25 MG PO TABS: 25.0000 mg | ORAL_TABLET | Freq: Every day | ORAL | Status: DC

## 2022-08-27 MED ORDER — AMLODIPINE-OLMESARTAN 10-40 MG PO TABS: 1.0000 | ORAL_TABLET | Freq: Every day | ORAL | Status: DC

## 2022-08-27 MED ORDER — AMLODIPINE BESYLATE 5 MG PO TABS
10.0000 mg | ORAL_TABLET | Freq: Once | ORAL | Status: AC
  Administered 2022-08-27: 10 mg via ORAL
  Filled 2022-08-27: qty 2
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
## 2022-08-27 NOTE — ED Notes (Addendum)
Bed alarm in place at this time
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
## 2022-08-28 DIAGNOSIS — R456 Violent behavior: Secondary | ICD-10-CM | POA: Diagnosis not present

## 2022-08-28 DIAGNOSIS — F03918 Unspecified dementia, unspecified severity, with other behavioral disturbance: Secondary | ICD-10-CM

## 2022-08-28 MED ORDER — DIVALPROEX SODIUM 125 MG PO CSDR: 250.0000 mg | DELAYED_RELEASE_CAPSULE | ORAL | Status: DC

## 2022-08-28 MED ORDER — DIVALPROEX SODIUM 125 MG PO CSDR
250.0000 mg | DELAYED_RELEASE_CAPSULE | Freq: Two times a day (BID) | ORAL | Status: DC
  Administered 2022-08-28 – 2022-08-31 (×6): 250 mg via ORAL
  Filled 2022-08-28 (×8): qty 2

## 2022-08-28 MED ORDER — HALOPERIDOL 0.5 MG PO TABS
2.5000 mg | ORAL_TABLET | Freq: Two times a day (BID) | ORAL | Status: DC | PRN
  Administered 2022-08-28: 2.5 mg via ORAL
  Filled 2022-08-28: qty 1

## 2022-08-28 MED ORDER — ACETAMINOPHEN 325 MG PO TABS
650.0000 mg | ORAL_TABLET | Freq: Four times a day (QID) | ORAL | Status: AC | PRN
  Administered 2022-08-28: 650 mg via ORAL
  Filled 2022-08-28: qty 2

## 2022-08-28 MED ORDER — ACETAMINOPHEN 325 MG PO TABS: 650.0000 mg | ORAL_TABLET | Freq: Four times a day (QID) | ORAL | Status: DC | PRN

## 2022-08-28 MED ORDER — ACETAMINOPHEN 325 MG PO TABS
650.0000 mg | ORAL_TABLET | Freq: Once | ORAL | Status: AC
  Administered 2022-08-28: 650 mg via ORAL
  Filled 2022-08-28: qty 2

## 2022-08-28 NOTE — ED Notes (Signed)
Pt was given dinner tray.  

## 2022-08-28 NOTE — ED Notes (Signed)
This tech obtained vital signs on pt.  

## 2022-08-28 NOTE — ED Notes (Signed)
This nurse went to give the pt her medication and notice the IV on the bedside table. This nurse checked the pt's arm for bleed and did not see any. The IV was correctly disposed of. When this nurse asked what happened to the IV, the pt responds, ' she found it like that'.

## 2022-08-28 NOTE — ED Notes (Signed)
VOL/pending psych consult 

## 2022-08-28 NOTE — ED Notes (Signed)
Breakfast tray is given to the pt at this time.

## 2022-08-28 NOTE — ED Provider Notes (Signed)
Emergency Medicine Observation Re-evaluation Note  Physical Exam   BP 121/74   Pulse 62   Temp 98 F (36.7 C) (Oral)   Resp 14   Wt 47.6 kg   SpO2 98%   BMI 21.21 kg/m   Patient appears in no acute distress.  ED Course / MDM   No reported events during my shift at the time of this note.   Pt is awaiting dispo from SW   Pilar Jarvis MD    Pilar Jarvis, MD 08/28/22 [phone]

## 2022-08-28 NOTE — ED Notes (Signed)
Pt went to use the restroom and complained of a muscle cramp in her right leg. Provider Dr. Modesto Charon was notified and wants to continue to monitor. Pt was given 2 heat packs to apply to the area.

## 2022-08-28 NOTE — ED Notes (Signed)
Pt stood up from the bed due to a leg cramp coming back in the right leg and woke her up from her sleep. Pt was given new heat packs and Provider Dr. Modesto Charon placed some Tylenol orders in.
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
## 2022-08-28 NOTE — ED Notes (Signed)
 This pt given snack and beverage.

## 2022-08-28 NOTE — ED Notes (Signed)
Pt up to the toilet with standby assist; gait steady. Pt c/o BLE cramping. RN walked with pt around room and to do some leg stretches. Pt states it helped a little, but wants something for pain. Provider notified.

## 2022-08-28 NOTE — Group Note (Deleted)
Date:  08/28/2022 Time:  2:12 AM  Group Topic/Focus:  Wrap-Up Group:   The focus of this group is to help patients review their daily goal of treatment and discuss progress on daily workbooks.     Participation Level:  {BHH PARTICIPATION ZOXWR:60454}  Participation Quality:  {BHH PARTICIPATION QUALITY:22265}  Affect:  {BHH AFFECT:22266}  Cognitive:  {BHH COGNITIVE:22267}  Insight: {BHH Insight2:20797}  Engagement in Group:  {BHH ENGAGEMENT IN UJWJX:91478}  Modes of Intervention:  {BHH MODES OF INTERVENTION:22269}  Additional Comments:  ***  Maglione,Pierina Schuknecht E 08/28/2022, 2:12 AM

## 2022-08-28 NOTE — ED Notes (Signed)
Pt is VOL
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
## 2022-08-28 NOTE — BH Assessment (Signed)
Writer attempted to contact pt's legal guardian Thayer Ohm [phone]); however there was no answer. A HIPAA compliant voicemail this week.

## 2022-08-28 NOTE — ED Notes (Signed)
Pt keeps trying to walk out of room.  Repeatedly asking to use the restroom, when she has been redirected to her own room which has a restroom in it. Saying "I will just pee in bed I guess" because this tech will not let her go to the bathroom in the hall.

## 2022-08-28 NOTE — ED Notes (Signed)
Pt bed alarm went off, pt was getting out of bed to use the toilet. Waited with pt until she was finished to ensure she gets back into the bed safety and bed alarm is on. Pt complained of leg cramps, RN notified.

## 2022-08-28 NOTE — ED Notes (Signed)
Pt in new room, showed around the locked area and advised to knock on the door if she needed anything. Pt given blanket for comfort.
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
## 2022-08-28 NOTE — ED Notes (Signed)
Pt is sleeping

## 2022-08-28 NOTE — ED Provider Notes (Signed)
Emergency Medicine Observation Re-evaluation Note  Madeline Taylor is a 87 y.o. female, seen on rounds today.  Pt initially presented to the ED for complaints of Psychiatric Evaluation  Currently, the patient is resting in bed. No reported issues from nursing team.   Physical Exam  BP (!) 147/95   Pulse (!) 58   Temp 97.8 F (36.6 C) (Oral)   Resp 16   Wt 47.6 kg   SpO2 100%   BMI 21.21 kg/m  Physical Exam General: Resting in bed  Plan  Current plan is for dispo per psychiatry.    Trinna Post, MD 08/28/22 [phone]

## 2022-08-28 NOTE — ED Notes (Signed)
VOL  
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
## 2022-08-28 NOTE — ED Notes (Signed)
TTS @ BS

## 2022-08-29 DIAGNOSIS — R456 Violent behavior: Secondary | ICD-10-CM | POA: Diagnosis not present

## 2022-08-29 DIAGNOSIS — F03918 Unspecified dementia, unspecified severity, with other behavioral disturbance: Secondary | ICD-10-CM | POA: Diagnosis not present
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
## 2022-08-29 NOTE — ED Provider Notes (Signed)
Emergency Medicine Observation Re-evaluation Note  Madeline Taylor is a 87 y.o. female, seen on rounds today.  Pt initially presented to the ED for complaints of Psychiatric Evaluation  Currently, the patient is resting comfortably.  Physical Exam  BP (!) 144/83   Pulse 82   Temp (!) 97.5 F (36.4 C) (Oral)   Resp 20   Wt 47.6 kg   SpO2 98%   BMI 21.21 kg/m  General: No acute distress Cardiac: Well-perfused extremities Lungs: No respiratory distress Psych: Appropriate mood and affect  ED Course / MDM  EKG:   I have reviewed the labs performed to date as well as medications administered while in observation.  Recent changes in the last 24 hours include none.  Plan  Current plan is for placement.   Merwyn Katos, MD 08/29/22 [phone]

## 2022-08-29 NOTE — ED Notes (Signed)
Patient is vol pending psych consult

## 2022-08-29 NOTE — ED Notes (Signed)
Dinner tray provided

## 2022-08-29 NOTE — ED Notes (Addendum)
Pt was provided with a breakfast tray, clean clothing, clean linen and hygiene items. Pt was encouraged to take a shower and eat her breakfast. Pt verbalized understanding and appreciation for the items provided.Pt stated she was not hungry at this time.

## 2022-08-29 NOTE — ED Notes (Signed)
Pt. Visiting with daughter

## 2022-08-30 DIAGNOSIS — F03918 Unspecified dementia, unspecified severity, with other behavioral disturbance: Secondary | ICD-10-CM

## 2022-08-30 DIAGNOSIS — R456 Violent behavior: Secondary | ICD-10-CM | POA: Diagnosis not present

## 2022-08-30 NOTE — ED Notes (Signed)
Vol pending consult 

## 2022-08-30 NOTE — ED Notes (Signed)
Patient is pacing around in hallway, No aggression noted.

## 2022-08-30 NOTE — TOC CM/SW Note (Addendum)
Cm received secure email from Sharlyne Cai [phone] [email or web address], Adult protective services supervisor. Stating pt is from [facility].  Sharlyne Cai asked if pt could be seen by Psych to help with placement.   Sent secure email to Sharlyne Cai regarding if pt going back to [facility].
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
## 2022-08-30 NOTE — ED Provider Notes (Signed)
Emergency Medicine Observation Re-evaluation Note  Madeline Taylor is a 87 y.o. female, seen on rounds today.  Pt initially presented to the ED for complaints of Psychiatric Evaluation Currently, the patient is resting comfortably.  Physical Exam  BP (!) 143/68 (BP Location: Left Arm)   Pulse 80   Temp 98.3 F (36.8 C) (Oral)   Resp 18   Wt 47.6 kg   SpO2 98%   BMI 21.21 kg/m  Physical Exam General: No acute distress Cardiac: Extremities well-perfused Lungs: Normal work of breathing Psych: Appropriate mood, calm  ED Course / MDM  EKG:   I have reviewed the labs performed to date as well as medications administered while in observation.  Recent changes in the last 24 hours include no changes.  Plan  Current plan is for placement.    Janith Lima, MD 08/30/22 [phone]

## 2022-08-31 DIAGNOSIS — R279 Unspecified lack of coordination: Secondary | ICD-10-CM | POA: Diagnosis not present

## 2022-08-31 DIAGNOSIS — F03918 Unspecified dementia, unspecified severity, with other behavioral disturbance: Secondary | ICD-10-CM | POA: Diagnosis not present

## 2022-08-31 DIAGNOSIS — R456 Violent behavior: Secondary | ICD-10-CM | POA: Diagnosis not present

## 2022-08-31 DIAGNOSIS — Z743 Need for continuous supervision: Secondary | ICD-10-CM | POA: Diagnosis not present

## 2022-08-31 NOTE — ED Notes (Addendum)
Pt provided with breakfast tray. Pt consumed 70% of meal.

## 2022-08-31 NOTE — TOC Transition Note (Signed)
Transition of Care [facility]) - CM/SW Discharge Note   Patient Details  Name: Madeline Taylor MRN: [id number] Date of Birth: 1935/07/14  Transition of Care [facility]) CM/SW Contact:  Kreg Shropshire, RN Phone Number: 08/31/2022, 2:00 PM   Clinical Narrative:    Patient will DC to: [facility]  Anticipated DC date: 08/31/22 Transport by: Non ems transport  Per MD patient ready for DC to . RN, patient, patient's family, and facility notified of DC. Discharge Summary sent to facility. RN given number for report. DC packet on chart. Ambulance transport requested for patient.  TOC signing off.    Final next level of care: Assisted Living     Patient Goals and CMS Choice      Discharge Placement                  Patient to be transferred to facility by: no ems transport   Patient and family notified of of transfer: 08/31/22  Discharge Plan and Services Additional resources added to the After Visit Summary for                                       Social Determinants of Health (SDOH) Interventions SDOH Screenings   Food Insecurity: Patient Unable To Answer (04/23/2022)  Housing: Low Risk  (04/23/2022)  Transportation Needs: No Transportation Needs (04/23/2022)  Utilities: Not At Risk (04/23/2022)  Alcohol Screen: Low Risk  (05/21/2021)  Depression (PHQ2-9): Low Risk  (05/21/2021)  Financial Resource Strain: Low Risk  (05/21/2021)  Physical Activity: Inactive (05/21/2021)  Social Connections: Moderately Integrated (05/21/2021)  Stress: No Stress Concern Present (05/21/2021)  Tobacco Use: Low Risk  (08/27/2022)     Readmission Risk Interventions     No data to display
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
## 2022-08-31 NOTE — ED Notes (Signed)
[employer] and gave report to Rica Mote RN at discharge.
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
## 2022-08-31 NOTE — ED Notes (Signed)
Pt provided with lunch tray.

## 2022-08-31 NOTE — ED Notes (Signed)
Patient is pacing back and forth in hallway, she is calm, no behavioral issues noted, she is pleasant, will continue to monitor for safety.

## 2022-08-31 NOTE — ED Provider Notes (Signed)
-----------------------------------------   1:54 PM on 08/31/2022 -----------------------------------------  The patient has been cleared by the psychiatry team.  Per social work, she has also been accepted to go back to her facility.  At this time, her vital signs are stable, her medical workup is unremarkable, and she is calm and appropriate.  She is stable for discharge.     Dionne Bucy, MD 08/31/22 1355

## 2022-09-02 DIAGNOSIS — I6990 Unspecified sequelae of unspecified cerebrovascular disease: Secondary | ICD-10-CM | POA: Diagnosis not present

## 2022-09-02 DIAGNOSIS — F411 Generalized anxiety disorder: Secondary | ICD-10-CM | POA: Diagnosis not present

## 2022-09-02 DIAGNOSIS — F5105 Insomnia due to other mental disorder: Secondary | ICD-10-CM | POA: Diagnosis not present

## 2022-09-02 DIAGNOSIS — F0150 Vascular dementia without behavioral disturbance: Secondary | ICD-10-CM | POA: Diagnosis not present

## 2022-09-03 DIAGNOSIS — E559 Vitamin D deficiency, unspecified: Secondary | ICD-10-CM | POA: Diagnosis not present

## 2022-09-03 DIAGNOSIS — I10 Essential (primary) hypertension: Secondary | ICD-10-CM | POA: Diagnosis not present

## 2022-09-03 DIAGNOSIS — F01511 Vascular dementia, unspecified severity, with agitation: Secondary | ICD-10-CM | POA: Diagnosis not present

## 2022-09-03 DIAGNOSIS — I129 Hypertensive chronic kidney disease with stage 1 through stage 4 chronic kidney disease, or unspecified chronic kidney disease: Secondary | ICD-10-CM | POA: Diagnosis not present

## 2022-09-03 DIAGNOSIS — E539 Vitamin B deficiency, unspecified: Secondary | ICD-10-CM | POA: Diagnosis not present

## 2022-09-03 DIAGNOSIS — E7800 Pure hypercholesterolemia, unspecified: Secondary | ICD-10-CM | POA: Diagnosis not present

## 2022-09-03 DIAGNOSIS — I69815 Cognitive social or emotional deficit following other cerebrovascular disease: Secondary | ICD-10-CM | POA: Diagnosis not present

## 2022-09-03 DIAGNOSIS — N182 Chronic kidney disease, stage 2 (mild): Secondary | ICD-10-CM | POA: Diagnosis not present

## 2022-09-03 DIAGNOSIS — I69318 Other symptoms and signs involving cognitive functions following cerebral infarction: Secondary | ICD-10-CM | POA: Diagnosis not present

## 2022-09-03 DIAGNOSIS — G4700 Insomnia, unspecified: Secondary | ICD-10-CM | POA: Diagnosis not present

## 2022-09-03 DIAGNOSIS — F01518 Vascular dementia, unspecified severity, with other behavioral disturbance: Secondary | ICD-10-CM | POA: Diagnosis not present

## 2022-09-06 DIAGNOSIS — F01518 Vascular dementia, unspecified severity, with other behavioral disturbance: Secondary | ICD-10-CM | POA: Diagnosis not present

## 2022-09-06 DIAGNOSIS — I10 Essential (primary) hypertension: Secondary | ICD-10-CM | POA: Diagnosis not present

## 2022-09-06 DIAGNOSIS — I69815 Cognitive social or emotional deficit following other cerebrovascular disease: Secondary | ICD-10-CM | POA: Diagnosis not present

## 2022-09-06 DIAGNOSIS — F01511 Vascular dementia, unspecified severity, with agitation: Secondary | ICD-10-CM | POA: Diagnosis not present

## 2022-09-06 DIAGNOSIS — I69318 Other symptoms and signs involving cognitive functions following cerebral infarction: Secondary | ICD-10-CM | POA: Diagnosis not present

## 2022-09-06 DIAGNOSIS — G4700 Insomnia, unspecified: Secondary | ICD-10-CM | POA: Diagnosis not present

## 2022-09-07 DIAGNOSIS — F5105 Insomnia due to other mental disorder: Secondary | ICD-10-CM | POA: Diagnosis not present

## 2022-09-07 DIAGNOSIS — F01518 Vascular dementia, unspecified severity, with other behavioral disturbance: Secondary | ICD-10-CM | POA: Diagnosis not present

## 2022-09-07 DIAGNOSIS — I129 Hypertensive chronic kidney disease with stage 1 through stage 4 chronic kidney disease, or unspecified chronic kidney disease: Secondary | ICD-10-CM | POA: Diagnosis not present

## 2022-09-07 DIAGNOSIS — R4689 Other symptoms and signs involving appearance and behavior: Secondary | ICD-10-CM | POA: Diagnosis not present

## 2022-09-07 DIAGNOSIS — I679 Cerebrovascular disease, unspecified: Secondary | ICD-10-CM | POA: Diagnosis not present

## 2022-09-08 DIAGNOSIS — F01511 Vascular dementia, unspecified severity, with agitation: Secondary | ICD-10-CM | POA: Diagnosis not present

## 2022-09-08 DIAGNOSIS — F01518 Vascular dementia, unspecified severity, with other behavioral disturbance: Secondary | ICD-10-CM | POA: Diagnosis not present

## 2022-09-08 DIAGNOSIS — I10 Essential (primary) hypertension: Secondary | ICD-10-CM | POA: Diagnosis not present

## 2022-09-08 DIAGNOSIS — G4700 Insomnia, unspecified: Secondary | ICD-10-CM | POA: Diagnosis not present

## 2022-09-08 DIAGNOSIS — I69318 Other symptoms and signs involving cognitive functions following cerebral infarction: Secondary | ICD-10-CM | POA: Diagnosis not present

## 2022-09-08 DIAGNOSIS — I69815 Cognitive social or emotional deficit following other cerebrovascular disease: Secondary | ICD-10-CM | POA: Diagnosis not present

## 2022-09-09 DIAGNOSIS — F01518 Vascular dementia, unspecified severity, with other behavioral disturbance: Secondary | ICD-10-CM | POA: Diagnosis not present

## 2022-09-09 DIAGNOSIS — I6990 Unspecified sequelae of unspecified cerebrovascular disease: Secondary | ICD-10-CM | POA: Diagnosis not present

## 2022-09-09 DIAGNOSIS — F411 Generalized anxiety disorder: Secondary | ICD-10-CM | POA: Diagnosis not present

## 2022-09-09 DIAGNOSIS — F5105 Insomnia due to other mental disorder: Secondary | ICD-10-CM | POA: Diagnosis not present

## 2022-09-12 DIAGNOSIS — I69815 Cognitive social or emotional deficit following other cerebrovascular disease: Secondary | ICD-10-CM | POA: Diagnosis not present

## 2022-09-12 DIAGNOSIS — F01518 Vascular dementia, unspecified severity, with other behavioral disturbance: Secondary | ICD-10-CM | POA: Diagnosis not present

## 2022-09-12 DIAGNOSIS — Z79899 Other long term (current) drug therapy: Secondary | ICD-10-CM | POA: Diagnosis not present

## 2022-09-12 DIAGNOSIS — I69318 Other symptoms and signs involving cognitive functions following cerebral infarction: Secondary | ICD-10-CM | POA: Diagnosis not present

## 2022-09-12 DIAGNOSIS — Z9181 History of falling: Secondary | ICD-10-CM | POA: Diagnosis not present

## 2022-09-12 DIAGNOSIS — Z556 Problems related to health literacy: Secondary | ICD-10-CM | POA: Diagnosis not present

## 2022-09-12 DIAGNOSIS — M159 Polyosteoarthritis, unspecified: Secondary | ICD-10-CM | POA: Diagnosis not present

## 2022-09-12 DIAGNOSIS — G4700 Insomnia, unspecified: Secondary | ICD-10-CM | POA: Diagnosis not present

## 2022-09-12 DIAGNOSIS — I10 Essential (primary) hypertension: Secondary | ICD-10-CM | POA: Diagnosis not present

## 2022-09-12 DIAGNOSIS — F01511 Vascular dementia, unspecified severity, with agitation: Secondary | ICD-10-CM | POA: Diagnosis not present

## 2022-09-12 DIAGNOSIS — R32 Unspecified urinary incontinence: Secondary | ICD-10-CM | POA: Diagnosis not present

## 2022-09-14 DIAGNOSIS — G4700 Insomnia, unspecified: Secondary | ICD-10-CM | POA: Diagnosis not present

## 2022-09-14 DIAGNOSIS — I679 Cerebrovascular disease, unspecified: Secondary | ICD-10-CM | POA: Diagnosis not present

## 2022-09-14 DIAGNOSIS — R4689 Other symptoms and signs involving appearance and behavior: Secondary | ICD-10-CM | POA: Diagnosis not present

## 2022-09-14 DIAGNOSIS — I10 Essential (primary) hypertension: Secondary | ICD-10-CM | POA: Diagnosis not present

## 2022-09-14 DIAGNOSIS — I69815 Cognitive social or emotional deficit following other cerebrovascular disease: Secondary | ICD-10-CM | POA: Diagnosis not present

## 2022-09-14 DIAGNOSIS — F01B18 Vascular dementia, moderate, with other behavioral disturbance: Secondary | ICD-10-CM | POA: Diagnosis not present

## 2022-09-14 DIAGNOSIS — F01518 Vascular dementia, unspecified severity, with other behavioral disturbance: Secondary | ICD-10-CM | POA: Diagnosis not present

## 2022-09-14 DIAGNOSIS — F01511 Vascular dementia, unspecified severity, with agitation: Secondary | ICD-10-CM | POA: Diagnosis not present

## 2022-09-14 DIAGNOSIS — I69318 Other symptoms and signs involving cognitive functions following cerebral infarction: Secondary | ICD-10-CM | POA: Diagnosis not present

## 2022-09-16 DIAGNOSIS — F5105 Insomnia due to other mental disorder: Secondary | ICD-10-CM | POA: Diagnosis not present

## 2022-09-16 DIAGNOSIS — I6990 Unspecified sequelae of unspecified cerebrovascular disease: Secondary | ICD-10-CM | POA: Diagnosis not present

## 2022-09-16 DIAGNOSIS — F01518 Vascular dementia, unspecified severity, with other behavioral disturbance: Secondary | ICD-10-CM | POA: Diagnosis not present

## 2022-09-16 DIAGNOSIS — F411 Generalized anxiety disorder: Secondary | ICD-10-CM | POA: Diagnosis not present

## 2022-09-17 DIAGNOSIS — I69815 Cognitive social or emotional deficit following other cerebrovascular disease: Secondary | ICD-10-CM | POA: Diagnosis not present

## 2022-09-17 DIAGNOSIS — I10 Essential (primary) hypertension: Secondary | ICD-10-CM | POA: Diagnosis not present

## 2022-09-17 DIAGNOSIS — I69318 Other symptoms and signs involving cognitive functions following cerebral infarction: Secondary | ICD-10-CM | POA: Diagnosis not present

## 2022-09-17 DIAGNOSIS — F01518 Vascular dementia, unspecified severity, with other behavioral disturbance: Secondary | ICD-10-CM | POA: Diagnosis not present

## 2022-09-17 DIAGNOSIS — F01511 Vascular dementia, unspecified severity, with agitation: Secondary | ICD-10-CM | POA: Diagnosis not present

## 2022-09-17 DIAGNOSIS — G4700 Insomnia, unspecified: Secondary | ICD-10-CM | POA: Diagnosis not present

## 2022-09-21 DIAGNOSIS — I739 Peripheral vascular disease, unspecified: Secondary | ICD-10-CM | POA: Diagnosis not present

## 2022-09-21 DIAGNOSIS — I679 Cerebrovascular disease, unspecified: Secondary | ICD-10-CM | POA: Diagnosis not present

## 2022-09-21 DIAGNOSIS — F01B18 Vascular dementia, moderate, with other behavioral disturbance: Secondary | ICD-10-CM | POA: Diagnosis not present

## 2022-09-21 DIAGNOSIS — I129 Hypertensive chronic kidney disease with stage 1 through stage 4 chronic kidney disease, or unspecified chronic kidney disease: Secondary | ICD-10-CM | POA: Diagnosis not present

## 2022-09-22 DIAGNOSIS — I69815 Cognitive social or emotional deficit following other cerebrovascular disease: Secondary | ICD-10-CM | POA: Diagnosis not present

## 2022-09-22 DIAGNOSIS — I10 Essential (primary) hypertension: Secondary | ICD-10-CM | POA: Diagnosis not present

## 2022-09-22 DIAGNOSIS — G4700 Insomnia, unspecified: Secondary | ICD-10-CM | POA: Diagnosis not present

## 2022-09-22 DIAGNOSIS — I69318 Other symptoms and signs involving cognitive functions following cerebral infarction: Secondary | ICD-10-CM | POA: Diagnosis not present

## 2022-09-22 DIAGNOSIS — F01511 Vascular dementia, unspecified severity, with agitation: Secondary | ICD-10-CM | POA: Diagnosis not present

## 2022-09-22 DIAGNOSIS — F01518 Vascular dementia, unspecified severity, with other behavioral disturbance: Secondary | ICD-10-CM | POA: Diagnosis not present

## 2022-09-23 DIAGNOSIS — F0152 Vascular dementia, unspecified severity, with psychotic disturbance: Secondary | ICD-10-CM | POA: Diagnosis not present

## 2022-09-23 DIAGNOSIS — I6990 Unspecified sequelae of unspecified cerebrovascular disease: Secondary | ICD-10-CM | POA: Diagnosis not present

## 2022-09-23 DIAGNOSIS — F411 Generalized anxiety disorder: Secondary | ICD-10-CM | POA: Diagnosis not present

## 2022-09-23 DIAGNOSIS — F5105 Insomnia due to other mental disorder: Secondary | ICD-10-CM | POA: Diagnosis not present

## 2022-09-24 DIAGNOSIS — F01518 Vascular dementia, unspecified severity, with other behavioral disturbance: Secondary | ICD-10-CM | POA: Diagnosis not present

## 2022-09-24 DIAGNOSIS — I69815 Cognitive social or emotional deficit following other cerebrovascular disease: Secondary | ICD-10-CM | POA: Diagnosis not present

## 2022-09-24 DIAGNOSIS — G4700 Insomnia, unspecified: Secondary | ICD-10-CM | POA: Diagnosis not present

## 2022-09-24 DIAGNOSIS — I10 Essential (primary) hypertension: Secondary | ICD-10-CM | POA: Diagnosis not present

## 2022-09-24 DIAGNOSIS — F01511 Vascular dementia, unspecified severity, with agitation: Secondary | ICD-10-CM | POA: Diagnosis not present

## 2022-09-24 DIAGNOSIS — I69318 Other symptoms and signs involving cognitive functions following cerebral infarction: Secondary | ICD-10-CM | POA: Diagnosis not present

## 2022-09-27 DIAGNOSIS — F01511 Vascular dementia, unspecified severity, with agitation: Secondary | ICD-10-CM | POA: Diagnosis not present

## 2022-09-27 DIAGNOSIS — I10 Essential (primary) hypertension: Secondary | ICD-10-CM | POA: Diagnosis not present

## 2022-09-27 DIAGNOSIS — I69815 Cognitive social or emotional deficit following other cerebrovascular disease: Secondary | ICD-10-CM | POA: Diagnosis not present

## 2022-09-27 DIAGNOSIS — G4700 Insomnia, unspecified: Secondary | ICD-10-CM | POA: Diagnosis not present

## 2022-09-27 DIAGNOSIS — F01518 Vascular dementia, unspecified severity, with other behavioral disturbance: Secondary | ICD-10-CM | POA: Diagnosis not present

## 2022-09-27 DIAGNOSIS — I69318 Other symptoms and signs involving cognitive functions following cerebral infarction: Secondary | ICD-10-CM | POA: Diagnosis not present

## 2022-09-29 DIAGNOSIS — G4700 Insomnia, unspecified: Secondary | ICD-10-CM | POA: Diagnosis not present

## 2022-09-29 DIAGNOSIS — F01518 Vascular dementia, unspecified severity, with other behavioral disturbance: Secondary | ICD-10-CM | POA: Diagnosis not present

## 2022-09-29 DIAGNOSIS — F01511 Vascular dementia, unspecified severity, with agitation: Secondary | ICD-10-CM | POA: Diagnosis not present

## 2022-09-29 DIAGNOSIS — I10 Essential (primary) hypertension: Secondary | ICD-10-CM | POA: Diagnosis not present

## 2022-09-29 DIAGNOSIS — I69815 Cognitive social or emotional deficit following other cerebrovascular disease: Secondary | ICD-10-CM | POA: Diagnosis not present

## 2022-09-29 DIAGNOSIS — I69318 Other symptoms and signs involving cognitive functions following cerebral infarction: Secondary | ICD-10-CM | POA: Diagnosis not present

## 2022-10-05 DIAGNOSIS — I10 Essential (primary) hypertension: Secondary | ICD-10-CM | POA: Diagnosis not present

## 2022-10-05 DIAGNOSIS — I69815 Cognitive social or emotional deficit following other cerebrovascular disease: Secondary | ICD-10-CM | POA: Diagnosis not present

## 2022-10-05 DIAGNOSIS — G4700 Insomnia, unspecified: Secondary | ICD-10-CM | POA: Diagnosis not present

## 2022-10-05 DIAGNOSIS — F01518 Vascular dementia, unspecified severity, with other behavioral disturbance: Secondary | ICD-10-CM | POA: Diagnosis not present

## 2022-10-05 DIAGNOSIS — F01511 Vascular dementia, unspecified severity, with agitation: Secondary | ICD-10-CM | POA: Diagnosis not present

## 2022-10-05 DIAGNOSIS — I69318 Other symptoms and signs involving cognitive functions following cerebral infarction: Secondary | ICD-10-CM | POA: Diagnosis not present

## 2022-10-07 DIAGNOSIS — I69318 Other symptoms and signs involving cognitive functions following cerebral infarction: Secondary | ICD-10-CM | POA: Diagnosis not present

## 2022-10-07 DIAGNOSIS — I69815 Cognitive social or emotional deficit following other cerebrovascular disease: Secondary | ICD-10-CM | POA: Diagnosis not present

## 2022-10-07 DIAGNOSIS — G4700 Insomnia, unspecified: Secondary | ICD-10-CM | POA: Diagnosis not present

## 2022-10-07 DIAGNOSIS — F01511 Vascular dementia, unspecified severity, with agitation: Secondary | ICD-10-CM | POA: Diagnosis not present

## 2022-10-07 DIAGNOSIS — I10 Essential (primary) hypertension: Secondary | ICD-10-CM | POA: Diagnosis not present

## 2022-10-07 DIAGNOSIS — F01518 Vascular dementia, unspecified severity, with other behavioral disturbance: Secondary | ICD-10-CM | POA: Diagnosis not present

## 2022-10-12 DIAGNOSIS — I679 Cerebrovascular disease, unspecified: Secondary | ICD-10-CM | POA: Diagnosis not present

## 2022-10-12 DIAGNOSIS — F01B18 Vascular dementia, moderate, with other behavioral disturbance: Secondary | ICD-10-CM | POA: Diagnosis not present

## 2022-10-12 DIAGNOSIS — R197 Diarrhea, unspecified: Secondary | ICD-10-CM | POA: Diagnosis not present

## 2022-10-12 DIAGNOSIS — K5900 Constipation, unspecified: Secondary | ICD-10-CM | POA: Diagnosis not present

## 2022-10-15 DIAGNOSIS — M1611 Unilateral primary osteoarthritis, right hip: Secondary | ICD-10-CM | POA: Diagnosis not present

## 2022-10-15 DIAGNOSIS — F411 Generalized anxiety disorder: Secondary | ICD-10-CM | POA: Diagnosis not present

## 2022-10-19 DIAGNOSIS — F01B18 Vascular dementia, moderate, with other behavioral disturbance: Secondary | ICD-10-CM | POA: Diagnosis not present

## 2022-10-19 DIAGNOSIS — I129 Hypertensive chronic kidney disease with stage 1 through stage 4 chronic kidney disease, or unspecified chronic kidney disease: Secondary | ICD-10-CM | POA: Diagnosis not present

## 2022-10-19 DIAGNOSIS — M1611 Unilateral primary osteoarthritis, right hip: Secondary | ICD-10-CM | POA: Diagnosis not present

## 2022-10-19 DIAGNOSIS — I679 Cerebrovascular disease, unspecified: Secondary | ICD-10-CM | POA: Diagnosis not present

## 2022-10-19 DIAGNOSIS — N182 Chronic kidney disease, stage 2 (mild): Secondary | ICD-10-CM | POA: Diagnosis not present

## 2022-10-19 DIAGNOSIS — K5900 Constipation, unspecified: Secondary | ICD-10-CM | POA: Diagnosis not present

## 2022-10-21 DIAGNOSIS — F01B18 Vascular dementia, moderate, with other behavioral disturbance: Secondary | ICD-10-CM | POA: Diagnosis not present

## 2022-10-21 DIAGNOSIS — F5105 Insomnia due to other mental disorder: Secondary | ICD-10-CM | POA: Diagnosis not present

## 2022-10-21 DIAGNOSIS — F411 Generalized anxiety disorder: Secondary | ICD-10-CM | POA: Diagnosis not present

## 2022-10-21 DIAGNOSIS — I6990 Unspecified sequelae of unspecified cerebrovascular disease: Secondary | ICD-10-CM | POA: Diagnosis not present

## 2022-10-27 DIAGNOSIS — Z23 Encounter for immunization: Secondary | ICD-10-CM | POA: Diagnosis not present

## 2022-11-16 DIAGNOSIS — M1611 Unilateral primary osteoarthritis, right hip: Secondary | ICD-10-CM | POA: Diagnosis not present

## 2022-11-16 DIAGNOSIS — K219 Gastro-esophageal reflux disease without esophagitis: Secondary | ICD-10-CM | POA: Diagnosis not present

## 2022-11-16 DIAGNOSIS — K5900 Constipation, unspecified: Secondary | ICD-10-CM | POA: Diagnosis not present

## 2022-11-16 DIAGNOSIS — I679 Cerebrovascular disease, unspecified: Secondary | ICD-10-CM | POA: Diagnosis not present

## 2022-11-16 DIAGNOSIS — I129 Hypertensive chronic kidney disease with stage 1 through stage 4 chronic kidney disease, or unspecified chronic kidney disease: Secondary | ICD-10-CM | POA: Diagnosis not present

## 2022-11-16 DIAGNOSIS — F01B18 Vascular dementia, moderate, with other behavioral disturbance: Secondary | ICD-10-CM | POA: Diagnosis not present

## 2022-11-18 DIAGNOSIS — M1611 Unilateral primary osteoarthritis, right hip: Secondary | ICD-10-CM | POA: Diagnosis not present

## 2022-11-18 DIAGNOSIS — F0390 Unspecified dementia without behavioral disturbance: Secondary | ICD-10-CM | POA: Diagnosis not present

## 2022-11-18 DIAGNOSIS — F5105 Insomnia due to other mental disorder: Secondary | ICD-10-CM | POA: Diagnosis not present

## 2022-11-24 DIAGNOSIS — E539 Vitamin B deficiency, unspecified: Secondary | ICD-10-CM | POA: Diagnosis not present

## 2022-11-24 DIAGNOSIS — E559 Vitamin D deficiency, unspecified: Secondary | ICD-10-CM | POA: Diagnosis not present

## 2022-12-02 DIAGNOSIS — F411 Generalized anxiety disorder: Secondary | ICD-10-CM | POA: Diagnosis not present

## 2022-12-02 DIAGNOSIS — I6990 Unspecified sequelae of unspecified cerebrovascular disease: Secondary | ICD-10-CM | POA: Diagnosis not present

## 2022-12-02 DIAGNOSIS — F0150 Vascular dementia without behavioral disturbance: Secondary | ICD-10-CM | POA: Diagnosis not present

## 2022-12-02 DIAGNOSIS — F5105 Insomnia due to other mental disorder: Secondary | ICD-10-CM | POA: Diagnosis not present

## 2022-12-07 DIAGNOSIS — R451 Restlessness and agitation: Secondary | ICD-10-CM | POA: Diagnosis not present

## 2022-12-07 DIAGNOSIS — F0150 Vascular dementia without behavioral disturbance: Secondary | ICD-10-CM | POA: Diagnosis not present

## 2022-12-07 DIAGNOSIS — I679 Cerebrovascular disease, unspecified: Secondary | ICD-10-CM | POA: Diagnosis not present

## 2022-12-07 DIAGNOSIS — F411 Generalized anxiety disorder: Secondary | ICD-10-CM | POA: Diagnosis not present

## 2022-12-13 DIAGNOSIS — E039 Hypothyroidism, unspecified: Secondary | ICD-10-CM | POA: Diagnosis not present

## 2022-12-13 DIAGNOSIS — Z79899 Other long term (current) drug therapy: Secondary | ICD-10-CM | POA: Diagnosis not present

## 2022-12-14 DIAGNOSIS — I739 Peripheral vascular disease, unspecified: Secondary | ICD-10-CM | POA: Diagnosis not present

## 2022-12-14 DIAGNOSIS — I69898 Other sequelae of other cerebrovascular disease: Secondary | ICD-10-CM | POA: Diagnosis not present

## 2022-12-14 DIAGNOSIS — F411 Generalized anxiety disorder: Secondary | ICD-10-CM | POA: Diagnosis not present

## 2022-12-14 DIAGNOSIS — F01B18 Vascular dementia, moderate, with other behavioral disturbance: Secondary | ICD-10-CM | POA: Diagnosis not present

## 2022-12-14 DIAGNOSIS — I129 Hypertensive chronic kidney disease with stage 1 through stage 4 chronic kidney disease, or unspecified chronic kidney disease: Secondary | ICD-10-CM | POA: Diagnosis not present

## 2022-12-14 DIAGNOSIS — I679 Cerebrovascular disease, unspecified: Secondary | ICD-10-CM | POA: Diagnosis not present

## 2022-12-14 DIAGNOSIS — N182 Chronic kidney disease, stage 2 (mild): Secondary | ICD-10-CM | POA: Diagnosis not present

## 2022-12-14 DIAGNOSIS — F5105 Insomnia due to other mental disorder: Secondary | ICD-10-CM | POA: Diagnosis not present

## 2022-12-14 DIAGNOSIS — F01B11 Vascular dementia, moderate, with agitation: Secondary | ICD-10-CM | POA: Diagnosis not present

## 2022-12-15 DIAGNOSIS — M1611 Unilateral primary osteoarthritis, right hip: Secondary | ICD-10-CM | POA: Diagnosis not present

## 2022-12-15 DIAGNOSIS — F0390 Unspecified dementia without behavioral disturbance: Secondary | ICD-10-CM | POA: Diagnosis not present

## 2022-12-30 DIAGNOSIS — I69898 Other sequelae of other cerebrovascular disease: Secondary | ICD-10-CM | POA: Diagnosis not present

## 2022-12-30 DIAGNOSIS — F5105 Insomnia due to other mental disorder: Secondary | ICD-10-CM | POA: Diagnosis not present

## 2022-12-30 DIAGNOSIS — F411 Generalized anxiety disorder: Secondary | ICD-10-CM | POA: Diagnosis not present

## 2022-12-30 DIAGNOSIS — F01B18 Vascular dementia, moderate, with other behavioral disturbance: Secondary | ICD-10-CM | POA: Diagnosis not present

## 2023-01-07 DIAGNOSIS — I700 Atherosclerosis of aorta: Secondary | ICD-10-CM | POA: Diagnosis not present

## 2023-01-07 DIAGNOSIS — I1310 Hypertensive heart and chronic kidney disease without heart failure, with stage 1 through stage 4 chronic kidney disease, or unspecified chronic kidney disease: Secondary | ICD-10-CM | POA: Diagnosis not present

## 2023-01-07 DIAGNOSIS — N182 Chronic kidney disease, stage 2 (mild): Secondary | ICD-10-CM | POA: Diagnosis not present

## 2023-01-07 DIAGNOSIS — I679 Cerebrovascular disease, unspecified: Secondary | ICD-10-CM | POA: Diagnosis not present

## 2023-01-07 DIAGNOSIS — F01B18 Vascular dementia, moderate, with other behavioral disturbance: Secondary | ICD-10-CM | POA: Diagnosis not present

## 2023-01-07 DIAGNOSIS — E559 Vitamin D deficiency, unspecified: Secondary | ICD-10-CM | POA: Diagnosis not present

## 2023-01-07 DIAGNOSIS — F411 Generalized anxiety disorder: Secondary | ICD-10-CM | POA: Diagnosis not present

## 2023-01-10 DIAGNOSIS — M1611 Unilateral primary osteoarthritis, right hip: Secondary | ICD-10-CM | POA: Diagnosis not present

## 2023-01-10 DIAGNOSIS — F321 Major depressive disorder, single episode, moderate: Secondary | ICD-10-CM | POA: Diagnosis not present

## 2023-01-10 DIAGNOSIS — F0390 Unspecified dementia without behavioral disturbance: Secondary | ICD-10-CM | POA: Diagnosis not present

## 2023-01-10 DIAGNOSIS — F411 Generalized anxiety disorder: Secondary | ICD-10-CM | POA: Diagnosis not present

## 2023-01-13 DIAGNOSIS — F5105 Insomnia due to other mental disorder: Secondary | ICD-10-CM | POA: Diagnosis not present

## 2023-01-13 DIAGNOSIS — I69898 Other sequelae of other cerebrovascular disease: Secondary | ICD-10-CM | POA: Diagnosis not present

## 2023-01-13 DIAGNOSIS — F01B18 Vascular dementia, moderate, with other behavioral disturbance: Secondary | ICD-10-CM | POA: Diagnosis not present

## 2023-01-13 DIAGNOSIS — F411 Generalized anxiety disorder: Secondary | ICD-10-CM | POA: Diagnosis not present

## 2023-01-27 DIAGNOSIS — F411 Generalized anxiety disorder: Secondary | ICD-10-CM | POA: Diagnosis not present

## 2023-01-27 DIAGNOSIS — F5105 Insomnia due to other mental disorder: Secondary | ICD-10-CM | POA: Diagnosis not present

## 2023-01-27 DIAGNOSIS — F01B18 Vascular dementia, moderate, with other behavioral disturbance: Secondary | ICD-10-CM | POA: Diagnosis not present

## 2023-01-27 DIAGNOSIS — I69898 Other sequelae of other cerebrovascular disease: Secondary | ICD-10-CM | POA: Diagnosis not present

## 2023-02-08 DIAGNOSIS — I739 Peripheral vascular disease, unspecified: Secondary | ICD-10-CM | POA: Diagnosis not present

## 2023-02-08 DIAGNOSIS — K219 Gastro-esophageal reflux disease without esophagitis: Secondary | ICD-10-CM | POA: Diagnosis not present

## 2023-02-08 DIAGNOSIS — Z0000 Encounter for general adult medical examination without abnormal findings: Secondary | ICD-10-CM | POA: Diagnosis not present

## 2023-02-08 DIAGNOSIS — R451 Restlessness and agitation: Secondary | ICD-10-CM | POA: Diagnosis not present

## 2023-02-08 DIAGNOSIS — E559 Vitamin D deficiency, unspecified: Secondary | ICD-10-CM | POA: Diagnosis not present

## 2023-02-08 DIAGNOSIS — K5900 Constipation, unspecified: Secondary | ICD-10-CM | POA: Diagnosis not present

## 2023-02-08 DIAGNOSIS — I6990 Unspecified sequelae of unspecified cerebrovascular disease: Secondary | ICD-10-CM | POA: Diagnosis not present

## 2023-02-10 DIAGNOSIS — F411 Generalized anxiety disorder: Secondary | ICD-10-CM | POA: Diagnosis not present

## 2023-02-10 DIAGNOSIS — F5101 Primary insomnia: Secondary | ICD-10-CM | POA: Diagnosis not present

## 2023-02-10 DIAGNOSIS — F01B18 Vascular dementia, moderate, with other behavioral disturbance: Secondary | ICD-10-CM | POA: Diagnosis not present

## 2023-02-10 DIAGNOSIS — I69898 Other sequelae of other cerebrovascular disease: Secondary | ICD-10-CM | POA: Diagnosis not present

## 2023-02-24 DIAGNOSIS — I69898 Other sequelae of other cerebrovascular disease: Secondary | ICD-10-CM | POA: Diagnosis not present

## 2023-02-24 DIAGNOSIS — F01B18 Vascular dementia, moderate, with other behavioral disturbance: Secondary | ICD-10-CM | POA: Diagnosis not present

## 2023-02-24 DIAGNOSIS — F5101 Primary insomnia: Secondary | ICD-10-CM | POA: Diagnosis not present

## 2023-02-24 DIAGNOSIS — F411 Generalized anxiety disorder: Secondary | ICD-10-CM | POA: Diagnosis not present

## 2023-09-07 ENCOUNTER — Other Ambulatory Visit: Payer: Self-pay

## 2023-09-07 ENCOUNTER — Encounter: Payer: Self-pay | Admitting: Emergency Medicine

## 2023-09-07 ENCOUNTER — Emergency Department
Admission: EM | Admit: 2023-09-07 | Discharge: 2023-09-08 | Disposition: A | Discharge status: Skilled Nursing Facility | Attending: Emergency Medicine | Admitting: Emergency Medicine

## 2023-09-07 DIAGNOSIS — L509 Urticaria, unspecified: Secondary | ICD-10-CM | POA: Diagnosis present

## 2023-09-07 DIAGNOSIS — L500 Allergic urticaria: Secondary | ICD-10-CM | POA: Diagnosis not present

## 2023-09-07 DIAGNOSIS — T7840XA Allergy, unspecified, initial encounter: Secondary | ICD-10-CM

## 2023-09-07 MED ORDER — FAMOTIDINE IN NACL 20-0.9 MG/50ML-% IV SOLN
20.0000 mg | Freq: Once | INTRAVENOUS | Status: AC
  Administered 2023-09-07: 20 mg via INTRAVENOUS
  Filled 2023-09-07: qty 50

## 2023-09-07 MED ORDER — METHYLPREDNISOLONE SODIUM SUCC 125 MG IJ SOLR
125.0000 mg | Freq: Once | INTRAMUSCULAR | Status: AC
  Administered 2023-09-07: 125 mg via INTRAVENOUS
  Filled 2023-09-07: qty 2

## 2023-09-07 MED ORDER — DIPHENHYDRAMINE HCL 50 MG PO TABS: 25.0000 mg | ORAL_TABLET | Freq: Three times a day (TID) | ORAL | 0 refills | Status: AC

## 2023-09-07 MED ORDER — ACETAMINOPHEN 325 MG PO TABS: 650.0000 mg | ORAL_TABLET | Freq: Once | ORAL | Status: DC

## 2023-09-07 MED ORDER — PREDNISONE 50 MG PO TABS: 50.0000 mg | ORAL_TABLET | Freq: Every day | ORAL | 0 refills | Status: AC

## 2023-09-07 NOTE — ED Provider Notes (Signed)
 [facility] Provider Note   Event Date/Time   First MD Initiated Contact with Patient 09/07/23 1916     (approximate) History  Allergic Reaction  HPI NIKIRA KUSHNIR is a 88 y.o. female who presents from memory care facility via EMS with concerns for possible allergic reaction.  Patient has hives and periorbital swelling that was noticed by staff member today and called EMS.  No known new medications.  Staff state patient has not had any food out of the ordinary.  Patient is unable to give a history at this time but is cooperative ROS: Unable to assess   Physical Exam  Triage Vital Signs: ED Triage Vitals  Encounter Vitals Group     BP 09/07/23 1922 (!) 186/83     Girls Systolic BP Percentile --      Girls Diastolic BP Percentile --      Boys Systolic BP Percentile --      Boys Diastolic BP Percentile --      Pulse Rate 09/07/23 1922 79     Resp 09/07/23 1922 18     Temp 09/07/23 1922 (!) 101.3 F (38.5 C)     Temp Source 09/07/23 1922 Axillary     SpO2 09/07/23 1922 98 %     Weight 09/07/23 1924 117 lb 4.6 oz (53.2 kg)     Height --      Head Circumference --      Peak Flow --      Pain Score 09/07/23 1924 0     Pain Loc --      Pain Education --      Exclude from Growth Chart --    Most recent vital signs: Vitals:   09/07/23 2110 09/07/23 2215  BP: (!) 185/75 (!) 184/67  Pulse: 64 (!) 59  Resp: (!) 22 15  Temp: 98.3 F (36.8 C) 98.3 F (36.8 C)  SpO2: 100% 100%   General: Awake, cooperative CV:  Good peripheral perfusion. Resp:  Normal effort. Abd:  No distention. Other:  Elderly well-developed, well-nourished eustachian female resting comfortably in no acute distress.  Hives over trunk and bilateral upper extremities to the face with periorbital swelling ED Results / Procedures / Treatments  Labs (all labs ordered are listed, but only abnormal results are displayed) Labs Reviewed - No data to display PROCEDURES: Critical Care  performed: No Procedures MEDICATIONS ORDERED IN ED: Medications  famotidine  (PEPCID ) IVPB 20 mg premix (0 mg Intravenous Stopped 09/07/23 2029)  methylPREDNISolone  sodium succinate (SOLU-MEDROL ) 125 mg/2 mL injection 125 mg (125 mg Intravenous Given 09/07/23 1959)   IMPRESSION / MDM / ASSESSMENT AND PLAN / ED COURSE  I reviewed the triage vital signs and the nursing notes.                             The patient is on the cardiac monitor to evaluate for evidence of arrhythmia and/or significant heart rate changes. Patient's presentation is most consistent with acute presentation with potential threat to life or bodily function. 88 year old female presents from memory care facility with concerns for possible allergic reaction + Cutaneous hives/erythema No evidence of multiorgan involvement  Given history and exam, presentation most consistent with allergic reaction. I have low suspicion for toxic shock syndrome, anaphylaxis, asthma exacerbation, or drug toxicity. Rx: Prednisone  60mg  qday x3days, Benadryl  25mg  q8hr x3days Disposition: Discharge home with SRP. Follow up with PCP in 1-2 days. Clinical Course as  of 09/07/23 2243  Wed Sep 07, 2023  2242 After reassessment, patient's hives are improving, patient continues to have no stridor, tongue swelling, or complaints of shortness of breath, difficulty swallowing, or chest pain.  Patient given prescription for 3 days of prednisone  as well as 3 days of every 8 hour Benadryl  for any continued allergic reaction.  Dispo: Discharge home with PCP follow-up [EB]    Clinical Course User Index [EB] Jossie Artist POUR, MD   FINAL CLINICAL IMPRESSION(S) / ED DIAGNOSES   Final diagnoses:  Allergic reaction, initial encounter  Hives   Rx / DC Orders   ED Discharge Orders          Ordered    predniSONE  (DELTASONE ) 50 MG tablet  Daily with breakfast        09/07/23 2242    diphenhydrAMINE  (BENADRYL ) 50 MG tablet  Every 8 hours        09/07/23  2242           Note:  This document was prepared using Dragon voice recognition software and may include unintentional dictation errors.   Jossie Artist POUR, MD 09/07/23 [phone]

## 2023-09-07 NOTE — ED Notes (Signed)
 Attempted to call [facility], no answer.. VM left

## 2023-09-07 NOTE — ED Notes (Signed)
 Madeline Taylor, department of social service [facility] [place] called back and gave verbal permission to transport pt back to facility if discharged,

## 2023-09-07 NOTE — ED Notes (Signed)
 ED Provider at bedside.

## 2023-09-07 NOTE — ED Triage Notes (Signed)
 Pt arrives from [facility]- memory care unit via ems for allergic reaction, s/s include generalized hives and bilateral eye swelling, that started this afternoon. Unknown source of allergy, pt started sertraline on 09/02/23. Pt denies difficulty breathing or pain. NADN.   50 mg benadryl  given by ems

## 2023-09-07 NOTE — ED Notes (Signed)
 Pt's bed alarm going off, pt attempting to get out of bed. Pt reports needing to go to restroom. Pt assisted to bedside commode. Gait unsteady. Pt urinated then assisted back in bed. Warm blankets provided. Bed alarm reset and call light within reach. Awaiting [facility] for transport back to facility.

## 2023-09-07 NOTE — ED Notes (Signed)
 Rechecked temp, pt no longer febrile. Jossie, MD notified and verified tylenol  not needed at this time.

## 2023-09-08 ENCOUNTER — Other Ambulatory Visit: Payer: Self-pay

## 2023-09-08 ENCOUNTER — Emergency Department

## 2023-09-08 ENCOUNTER — Emergency Department
Admission: EM | Admit: 2023-09-08 | Discharge: 2023-09-08 | Disposition: A | Discharge status: Skilled Nursing Facility | Attending: Emergency Medicine | Admitting: Emergency Medicine

## 2023-09-08 DIAGNOSIS — S0993XA Unspecified injury of face, initial encounter: Secondary | ICD-10-CM | POA: Diagnosis present

## 2023-09-08 DIAGNOSIS — S0083XA Contusion of other part of head, initial encounter: Secondary | ICD-10-CM | POA: Diagnosis not present

## 2023-09-08 DIAGNOSIS — Z8673 Personal history of transient ischemic attack (TIA), and cerebral infarction without residual deficits: Secondary | ICD-10-CM | POA: Insufficient documentation

## 2023-09-08 DIAGNOSIS — W19XXXA Unspecified fall, initial encounter: Secondary | ICD-10-CM

## 2023-09-08 DIAGNOSIS — M79641 Pain in right hand: Secondary | ICD-10-CM | POA: Insufficient documentation

## 2023-09-08 DIAGNOSIS — I10 Essential (primary) hypertension: Secondary | ICD-10-CM | POA: Diagnosis not present

## 2023-09-08 DIAGNOSIS — W01198A Fall on same level from slipping, tripping and stumbling with subsequent striking against other object, initial encounter: Secondary | ICD-10-CM | POA: Insufficient documentation

## 2023-09-08 DIAGNOSIS — F0390 Unspecified dementia without behavioral disturbance: Secondary | ICD-10-CM | POA: Diagnosis not present

## 2023-09-08 DIAGNOSIS — T07XXXA Unspecified multiple injuries, initial encounter: Secondary | ICD-10-CM

## 2023-09-08 NOTE — ED Notes (Signed)
 Attempted to call [facility], no answer at this time.

## 2023-09-08 NOTE — ED Notes (Signed)
 Pt's bed alarm notified this RN; pt assisted to bedside commode, pt urinated and returned to stretcher. Warm blankets provided. Awaiting [facility] for transport back to facility.
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
## 2023-09-08 NOTE — Discharge Instructions (Signed)
 Follow-up with your primary care provider if any continued problems or concerns.  Watch the abrasion on her face for any signs of infection and keep this area clean and dry.  Clean daily with mild soap and water.  Continue with her regular medications.  No fractures noted.  CT head, face and cervical spine were negative for acute injury.  Right hand was also x-rayed.

## 2023-09-08 NOTE — ED Notes (Signed)
 [facility] here to transport pt back to [facility]

## 2023-12-11 ENCOUNTER — Emergency Department

## 2023-12-11 ENCOUNTER — Emergency Department
Admission: EM | Admit: 2023-12-11 | Discharge: 2023-12-12 | Disposition: A | Discharge status: Home / Self Care | Attending: Emergency Medicine | Admitting: Emergency Medicine

## 2023-12-11 ENCOUNTER — Other Ambulatory Visit: Payer: Self-pay

## 2023-12-11 DIAGNOSIS — Y92009 Unspecified place in unspecified non-institutional (private) residence as the place of occurrence of the external cause: Secondary | ICD-10-CM | POA: Insufficient documentation

## 2023-12-11 DIAGNOSIS — F0390 Unspecified dementia without behavioral disturbance: Secondary | ICD-10-CM | POA: Insufficient documentation

## 2023-12-11 DIAGNOSIS — I10 Essential (primary) hypertension: Secondary | ICD-10-CM | POA: Diagnosis not present

## 2023-12-11 DIAGNOSIS — R519 Headache, unspecified: Secondary | ICD-10-CM | POA: Insufficient documentation

## 2023-12-11 DIAGNOSIS — M545 Low back pain, unspecified: Secondary | ICD-10-CM | POA: Diagnosis present

## 2023-12-11 DIAGNOSIS — W1839XA Other fall on same level, initial encounter: Secondary | ICD-10-CM | POA: Diagnosis not present

## 2023-12-11 DIAGNOSIS — W19XXXA Unspecified fall, initial encounter: Secondary | ICD-10-CM

## 2023-12-11 NOTE — ED Notes (Addendum)
 This RN spoke with [facility] on call social worker, Helayne, about pts ED visit and waiting on safe transport to be discharged to [facility]. Tyee took down a message for Hallie Davis, pts legal guardian.

## 2023-12-11 NOTE — Discharge Instructions (Signed)
 Your exam and CT scans are normal and reassuring.  No signs of a serious injury related to your fall.  Your exam is reassuring with no signs of any changes to your mobility.  Follow-up with your primary provider.  Return to the ED if needed.

## 2023-12-11 NOTE — ED Triage Notes (Signed)
 Pt to ED via EMS from [facility], pt reports she had a fall earlier tonight did not have LOC was initially c/o pain to her head and buttocks. Pt denies pain now, pt has hx dementia. Pt is not on blood thinners.

## 2023-12-11 NOTE — ED Provider Notes (Signed)
 [facility] Emergency Department Provider Note     Event Date/Time   First MD Initiated Contact with Patient 12/11/23 2010     (approximate)   History   Fall   HPI  Patient unable to provide current HPI due to baseline history of dementia.  Madeline Taylor is a 88 y.o. female with a history of dementia, HLD, HTN, osteoarthritis, pancreatitis, presents to the ED for evaluation after a mechanical fall.  Patient presents via EMS from her assisted living facility, with reports of a mechanical fall did not result in LOC.  She does endorse head pain as well as buttock/tailbone pain.  Patient is on any blood thinner regularly.  Physical Exam   Triage Vital Signs: ED Triage Vitals  Encounter Vitals Group     BP 12/11/23 1936 (!) 191/83     Girls Systolic BP Percentile --      Girls Diastolic BP Percentile --      Boys Systolic BP Percentile --      Boys Diastolic BP Percentile --      Pulse Rate 12/11/23 1936 70     Resp 12/11/23 1936 17     Temp 12/11/23 1936 98 F (36.7 C)     Temp src --      SpO2 12/11/23 1936 98 %     Weight 12/11/23 1935 160 lb (72.6 kg)     Height 12/11/23 1935 5' 1 (1.549 m)     Head Circumference --      Peak Flow --      Pain Score 12/11/23 1935 0     Pain Loc --      Pain Education --      Exclude from Growth Chart --     Most recent vital signs: Vitals:   12/11/23 1936  BP: (!) 191/83  Pulse: 70  Resp: 17  Temp: 98 F (36.7 C)  SpO2: 98%    General Awake, no distress.  Pleasant, engaged, and alert to person HEENT NCAT. PERRL. EOMI. No rhinorrhea. Mucous membranes are moist.  CV:  Good peripheral perfusion. RRR RESP:  Normal effort. CTA ABD:  No distention.  MSK:  Normal spinal alignment without midline tenderness, spasm, fomites, or step-off.  AROM of all extremities. NEURO: Cranial nerves II to XII grossly intact.  Normal gait with standby assist.   ED Results / Procedures / Treatments   Labs (all  labs ordered are listed, but only abnormal results are displayed) Labs Reviewed - No data to display   EKG   RADIOLOGY  I personally viewed and evaluated these images as part of my medical decision making, as well as reviewing the written report by the radiologist.  ED Provider Interpretation: No acute findings  CT Cervical Spine Wo Contrast Result Date: 12/11/2023 EXAM: CT CERVICAL SPINE WITHOUT CONTRAST 12/11/2023 07:56:00 PM TECHNIQUE: CT of the cervical spine was performed without the administration of intravenous contrast. Multiplanar reformatted images are provided for review. Automated exposure control, iterative reconstruction, and/or weight based adjustment of the mA/kV was utilized to reduce the radiation dose to as low as reasonably achievable. COMPARISON: None available. CLINICAL HISTORY: fall FINDINGS: CERVICAL SPINE: BONES AND ALIGNMENT: Vertebral body height is well maintained. Loss of the normal cervical lordosis is noted. Mild anterolisthesis of C3 on C4 is noted, of a degenerative nature. No acute fracture or traumatic malalignment. DEGENERATIVE CHANGES: Osteophytic changes are noted, and facet hypertrophic changes are seen. SOFT TISSUES: Surrounding soft tissue structures  are within normal limits. VISUALIZED LUNG APICES: Visualized lung apices are unremarkable. IMPRESSION: 1. No acute fracture or traumatic malalignment of the cervical spine. 2. Mild degenerative anterolisthesis of C3 on C4. 3. Loss of the normal cervical lordosis. Electronically signed by: Oneil Devonshire MD 12/11/2023 08:05 PM EST RP Workstation: [id number]   CT Head Wo Contrast Result Date: 12/11/2023 EXAM: CT HEAD WITHOUT CONTRAST 12/11/2023 07:56:00 PM TECHNIQUE: CT of the head was performed without the administration of intravenous contrast. Automated exposure control, iterative reconstruction, and/or weight based adjustment of the mA/kV was utilized to reduce the radiation dose to as low as reasonably  achievable. COMPARISON: 09/08/2023 CLINICAL HISTORY: fall FINDINGS: BRAIN AND VENTRICLES: No acute hemorrhage. No evidence of acute infarct. No extra-axial collection. No mass effect or midline shift. Chronic atrophic and chronic white matter ischemic changes are noted. Some scattered lacunar infarcts are noted in the thalamus bilaterally. ORBITS: No acute abnormality. SINUSES: No acute abnormality. SOFT TISSUES AND SKULL: No acute soft tissue abnormality. No skull fracture. IMPRESSION: 1. No acute intracranial abnormality related to the fall. 2. Chronic atrophic changes and chronic white matter ischemic disease with scattered bilateral thalamic lacunar infarcts. Electronically signed by: Oneil Devonshire MD 12/11/2023 08:01 PM EST RP Workstation: [id number] Sacrum/Coccyx Result Date: 12/11/2023 EXAM: XR Sacrum and Coccyx view(s) Xray of the sacrum and coccyx 12/11/2023 07:50:00 PM COMPARISON: None available. CLINICAL HISTORY: Fall Fall FINDINGS: BONES AND JOINTS: No acute fracture. No focal osseous lesion. No joint dislocation. SOFT TISSUES: The soft tissues are unremarkable. IMPRESSION: 1. No acute bony abnormality. Electronically signed by: Franky Crease MD 12/11/2023 07:57 PM EST RP Workstation: [id number]     PROCEDURES:  Critical Care performed: No  Procedures   MEDICATIONS ORDERED IN ED: Medications - No data to display   IMPRESSION / MDM / ASSESSMENT AND PLAN / ED COURSE  I reviewed the triage vital signs and the nursing notes.                              Differential diagnosis includes, but is not limited to, SDH, minor head injury, cervical strain, cervical fracture, cervical radiculopathy, tailbone fracture  Patient's presentation is most consistent with acute complicated illness / injury requiring diagnostic workup.  Patient's diagnosis is consistent with fall at home.  Jerrett patient with a baseline dementia, presents to the ED from facility for evaluation following  mechanical fall.  Patient presents in no acute distress.  She is unable to provide history for the current complaint.  Exam is reassuring as she is able to ambulate with standby assist.  No evidence of any acute arthropathy.  CT images interpreted by me, showed no acute fracture or dislocation.  No evidence of any tailbone injury.  Patient will be discharged home with tractions take her home meds. Patient is to follow up with her PCP as needed or otherwise directed. Patient is given ED precautions to return to the ED for any worsening or new symptoms.   FINAL CLINICAL IMPRESSION(S) / ED DIAGNOSES   Final diagnoses:  Fall in home, initial encounter     Rx / DC Orders   ED Discharge Orders     None        Note:  This document was prepared using Dragon voice recognition software and may include unintentional dictation errors.    Loyd Candida LULLA Aldona, PA-C 12/11/23 2024    Claudene Rover, MD 12/14/23 [phone]

## 2023-12-11 NOTE — ED Notes (Signed)
 Spoke with Rosaline Rake, pts daughter, and notified her that pt is in the ED
